# Patient Record
Sex: Female | Born: 1935
Health system: Southern US, Community
[De-identification: ages and names within clinical notes are randomized; demographics above are authoritative.]

## PROBLEM LIST (undated history)

## (undated) ENCOUNTER — Emergency Department: Admission: EM | Payer: Self-pay | Source: Home / Self Care

## (undated) DIAGNOSIS — C801 Malignant (primary) neoplasm, unspecified: Secondary | ICD-10-CM

## (undated) DIAGNOSIS — R51 Headache: Secondary | ICD-10-CM

## (undated) DIAGNOSIS — I272 Pulmonary hypertension, unspecified: Secondary | ICD-10-CM

## (undated) DIAGNOSIS — Z8041 Family history of malignant neoplasm of ovary: Secondary | ICD-10-CM

## (undated) DIAGNOSIS — H269 Unspecified cataract: Secondary | ICD-10-CM

## (undated) DIAGNOSIS — M1712 Unilateral primary osteoarthritis, left knee: Secondary | ICD-10-CM

## (undated) DIAGNOSIS — I10 Essential (primary) hypertension: Secondary | ICD-10-CM

## (undated) DIAGNOSIS — Z923 Personal history of irradiation: Secondary | ICD-10-CM

## (undated) DIAGNOSIS — E119 Type 2 diabetes mellitus without complications: Secondary | ICD-10-CM

## (undated) DIAGNOSIS — M1990 Unspecified osteoarthritis, unspecified site: Secondary | ICD-10-CM

## (undated) DIAGNOSIS — R519 Headache, unspecified: Secondary | ICD-10-CM

## (undated) DIAGNOSIS — E785 Hyperlipidemia, unspecified: Secondary | ICD-10-CM

## (undated) DIAGNOSIS — G8929 Other chronic pain: Secondary | ICD-10-CM

## (undated) DIAGNOSIS — E042 Nontoxic multinodular goiter: Secondary | ICD-10-CM

## (undated) DIAGNOSIS — Z87898 Personal history of other specified conditions: Secondary | ICD-10-CM

## (undated) DIAGNOSIS — K5792 Diverticulitis of intestine, part unspecified, without perforation or abscess without bleeding: Secondary | ICD-10-CM

## (undated) DIAGNOSIS — N189 Chronic kidney disease, unspecified: Secondary | ICD-10-CM

## (undated) HISTORY — DX: Personal history of other specified conditions: Z87.898

## (undated) HISTORY — PX: COLON SURGERY: SHX602

## (undated) HISTORY — PX: EYE SURGERY: SHX253

## (undated) HISTORY — PX: APPENDECTOMY: SHX54

## (undated) HISTORY — DX: Unspecified osteoarthritis, unspecified site: M19.90

## (undated) HISTORY — DX: Nontoxic multinodular goiter: E04.2

## (undated) HISTORY — DX: Headache, unspecified: R51.9

## (undated) HISTORY — PX: ABDOMINAL HYSTERECTOMY: SHX81

## (undated) HISTORY — DX: Other chronic pain: G89.29

## (undated) HISTORY — DX: Family history of malignant neoplasm of ovary: Z80.41

## (undated) HISTORY — DX: Unspecified cataract: H26.9

## (undated) HISTORY — PX: CHOLECYSTECTOMY: SHX55

## (undated) HISTORY — DX: Diverticulitis of intestine, part unspecified, without perforation or abscess without bleeding: K57.92

## (undated) HISTORY — DX: Headache: R51

## (undated) HISTORY — DX: Hyperlipidemia, unspecified: E78.5

## (undated) HISTORY — DX: Pulmonary hypertension, unspecified: I27.20

## (undated) HISTORY — DX: Chronic kidney disease, unspecified: N18.9

## (undated) HISTORY — DX: Unilateral primary osteoarthritis, left knee: M17.12

---

## 2006-02-01 HISTORY — PX: COLOSTOMY REVERSAL: SHX5782

## 2006-02-01 HISTORY — PX: COLOSTOMY: SHX63

## 2016-02-02 HISTORY — PX: REPLACEMENT TOTAL KNEE: SUR1224

## 2017-08-06 ENCOUNTER — Emergency Department
Admission: EM | Admit: 2017-08-06 | Discharge: 2017-08-06 | Disposition: A | Discharge status: Home / Self Care | Payer: Medicare Other | Attending: Emergency Medicine | Admitting: Emergency Medicine

## 2017-08-06 ENCOUNTER — Other Ambulatory Visit: Payer: Self-pay

## 2017-08-06 ENCOUNTER — Encounter: Payer: Self-pay | Admitting: Emergency Medicine

## 2017-08-06 ENCOUNTER — Emergency Department: Payer: Medicare Other

## 2017-08-06 DIAGNOSIS — R51 Headache: Secondary | ICD-10-CM | POA: Diagnosis present

## 2017-08-06 DIAGNOSIS — R519 Headache, unspecified: Secondary | ICD-10-CM

## 2017-08-06 DIAGNOSIS — I10 Essential (primary) hypertension: Secondary | ICD-10-CM | POA: Diagnosis not present

## 2017-08-06 HISTORY — DX: Essential (primary) hypertension: I10

## 2017-08-06 LAB — CBC WITH DIFFERENTIAL/PLATELET
Basophils Absolute: 0.1 10*3/uL (ref 0–0.1)
Basophils Relative: 1 %
EOS PCT: 4 %
Eosinophils Absolute: 0.3 10*3/uL (ref 0–0.7)
HEMATOCRIT: 34.3 % — AB (ref 35.0–47.0)
HEMOGLOBIN: 11.2 g/dL — AB (ref 12.0–16.0)
LYMPHS ABS: 3.2 10*3/uL (ref 1.0–3.6)
LYMPHS PCT: 49 %
MCH: 27.5 pg (ref 26.0–34.0)
MCHC: 32.8 g/dL (ref 32.0–36.0)
MCV: 83.9 fL (ref 80.0–100.0)
Monocytes Absolute: 0.7 10*3/uL (ref 0.2–0.9)
Monocytes Relative: 10 %
NEUTROS PCT: 36 %
Neutro Abs: 2.4 10*3/uL (ref 1.4–6.5)
Platelets: 279 10*3/uL (ref 150–440)
RBC: 4.09 MIL/uL (ref 3.80–5.20)
RDW: 16.3 % — ABNORMAL HIGH (ref 11.5–14.5)
WBC: 6.5 10*3/uL (ref 3.6–11.0)

## 2017-08-06 LAB — COMPREHENSIVE METABOLIC PANEL
ALT: 11 U/L (ref 0–44)
AST: 25 U/L (ref 15–41)
Albumin: 3.6 g/dL (ref 3.5–5.0)
Alkaline Phosphatase: 57 U/L (ref 38–126)
Anion gap: 8 (ref 5–15)
BUN: 24 mg/dL — ABNORMAL HIGH (ref 8–23)
CO2: 28 mmol/L (ref 22–32)
CREATININE: 1.16 mg/dL — AB (ref 0.44–1.00)
Calcium: 9.1 mg/dL (ref 8.9–10.3)
Chloride: 106 mmol/L (ref 98–111)
GFR, EST AFRICAN AMERICAN: 48 mL/min — AB (ref >60)
GFR, EST NON AFRICAN AMERICAN: 42 mL/min — AB (ref >60)
Glucose, Bld: 125 mg/dL — ABNORMAL HIGH (ref 70–99)
Potassium: 3.9 mmol/L (ref 3.5–5.1)
Sodium: 142 mmol/L (ref 135–145)
Total Bilirubin: 0.7 mg/dL (ref 0.3–1.2)
Total Protein: 7.7 g/dL (ref 6.5–8.1)

## 2017-08-06 MED ORDER — TRAMADOL HCL 50 MG PO TABS
50.0000 mg | ORAL_TABLET | Freq: Once | ORAL | Status: AC
  Administered 2017-08-06: 50 mg via ORAL
  Filled 2017-08-06: qty 1

## 2017-08-06 MED ORDER — TRAMADOL HCL 50 MG PO TABS: 50.0000 mg | ORAL_TABLET | Freq: Four times a day (QID) | ORAL | 0 refills | Status: DC | PRN

## 2017-08-06 MED ORDER — ONDANSETRON 4 MG PO TBDP
4.0000 mg | ORAL_TABLET | Freq: Once | ORAL | Status: AC
  Administered 2017-08-06: 4 mg via ORAL
  Filled 2017-08-06: qty 1

## 2017-08-06 NOTE — ED Provider Notes (Signed)
[facility] Emergency Department Provider Note       Time seen: ----------------------------------------- 4:20 PM on 08/06/2017 -----------------------------------------   I have reviewed the triage vital signs and the nursing notes.  HISTORY   Chief Complaint Headache    HPI Ellen [name] is a 84 y.o. female with a history of hypertension who presents to the ED for headache for the past 6 months.  She has a sore area on her scalp that is been worse over the last 3 to 5 days.  She had a CT scan of her head done ordered by her regular doctor for same she has not heard the results.  She fell and hit her head approximately a week ago.  She has no neurologic complaints, only persistent left-sided headache.  Denies fevers, chills or other complaints.  Past Medical History:  Diagnosis Date  . Hypertension     There are no active problems to display for this patient.   Past Surgical History:  Procedure Laterality Date  . ABDOMINAL HYSTERECTOMY    . CHOLECYSTECTOMY    . COLON SURGERY      Allergies Codeine and Morphine and related  Social History Social History   Tobacco Use  . Smoking status: Never Smoker  Substance Use Topics  . Alcohol use: Not on file  . Drug use: Not on file   Review of Systems Constitutional: Negative for fever. Eyes: Negative for vision changes Cardiovascular: Negative for chest pain. Respiratory: Negative for shortness of breath. Gastrointestinal: Negative for abdominal pain, vomiting and diarrhea. Musculoskeletal: Negative for back pain. Skin: Negative for rash. Neurological: Positive for headache  All systems negative/normal/unremarkable except as stated in the HPI  ____________________________________________   PHYSICAL EXAM:  VITAL SIGNS: ED Triage Vitals [08/06/17 1511]  Enc Vitals Group     BP (!) 158/90     Pulse Rate 69     Resp 20     Temp 98.4 F (36.9 C)     Temp Source Oral     SpO2 99 %     Weight 176 lb (79.8 kg)     Height 5' (1.524 m)     Head Circumference      Peak Flow      Pain Score 10     Pain Loc      Pain Edu?      Excl. in [place]?    Constitutional: Alert and oriented. Well appearing and in no distress. Eyes: Conjunctivae are normal. Normal extraocular movements. ENT   Head: Normocephalic and atraumatic.Significant tenderness to the left occiput, no tenderness over the temporal artery.  Normal-appearing scalp   Nose: No congestion/rhinnorhea.   Mouth/Throat: Mucous membranes are moist.   Neck: No stridor. Cardiovascular: Normal rate, regular rhythm. No murmurs, rubs, or gallops. Respiratory: Normal respiratory effort without tachypnea nor retractions. Breath sounds are clear and equal bilaterally. No wheezes/rales/rhonchi. Gastrointestinal: Soft and nontender. Normal bowel sounds Musculoskeletal: Nontender with normal range of motion in extremities. No lower extremity tenderness nor edema.  Neurologic:  Normal speech and language. No gross focal neurologic deficits are appreciated.  Skin:  Skin is warm, dry and intact. No rash noted. Psychiatric: Mood and affect are normal. Speech and behavior are normal.  ____________________________________________  ED COURSE:  As part of my medical decision making, I reviewed the following data within the [place] History obtained from family if available, nursing notes, old chart and ekg, as well as notes from prior ED visits. Patient presented for  headache, we will assess with labs and imaging as indicated at this time.   Procedures ____________________________________________   LABS (pertinent positives/negatives)  Labs Reviewed  CBC WITH DIFFERENTIAL/PLATELET - Abnormal; Notable for the following components:      Result Value   Hemoglobin 11.2 (*)    HCT 34.3 (*)    RDW 16.3 (*)    All other components within normal limits  COMPREHENSIVE METABOLIC PANEL - Abnormal; Notable for the  following components:   Glucose, Bld 125 (*)    BUN 24 (*)    Creatinine, Ser 1.16 (*)    GFR calc non Af Amer 42 (*)    GFR calc Af Amer 48 (*)    All other components within normal limits    RADIOLOGY Images were viewed by me  CT head is unremarkable  ____________________________________________  DIFFERENTIAL DIAGNOSIS   Chronic pain, tension headache, shingles, neuropathic pain  FINAL ASSESSMENT AND PLAN  Headache   Plan: The patient had presented for chronic headache. Patient's labs are unremarkable. Patient's imaging was unremarkable as well.  Unclear etiology for her headache.  She will be discharged with tramadol and is stable for outpatient follow-up.   Laurence Aly, MD   Note: This note was generated in part or whole with voice recognition software. Voice recognition is usually quite accurate but there are transcription errors that can and very often do occur. I apologize for any typographical errors that were not detected and corrected.     Earleen Newport, MD 08/06/17 [phone]

## 2017-08-06 NOTE — ED Triage Notes (Addendum)
States headache x 6 months. States had CT scan of head 5 days ago but does not know result. States has had approx 6 falls in last 3 months. States fell and hit head approx 1 week ago.  States headache has been increasing for past 3 days and is now the worst headache she has ever had in her life. Smile symmetrical, grips equal, leg strength equal. Denies use of blood thinners other than aspirin.

## 2017-08-23 ENCOUNTER — Other Ambulatory Visit: Payer: Self-pay

## 2017-08-23 ENCOUNTER — Encounter: Payer: Self-pay | Admitting: Emergency Medicine

## 2017-08-23 ENCOUNTER — Emergency Department
Admission: EM | Admit: 2017-08-23 | Discharge: 2017-08-23 | Disposition: A | Discharge status: Home / Self Care | Payer: Medicare Other | Attending: Emergency Medicine | Admitting: Emergency Medicine

## 2017-08-23 DIAGNOSIS — R519 Headache, unspecified: Secondary | ICD-10-CM

## 2017-08-23 DIAGNOSIS — I10 Essential (primary) hypertension: Secondary | ICD-10-CM

## 2017-08-23 DIAGNOSIS — R51 Headache: Secondary | ICD-10-CM

## 2017-08-23 MED ORDER — LISINOPRIL 10 MG PO TABS
20.0000 mg | ORAL_TABLET | Freq: Once | ORAL | Status: AC
  Administered 2017-08-23: 20 mg via ORAL
  Filled 2017-08-23: qty 2

## 2017-08-23 MED ORDER — HYDROCHLOROTHIAZIDE 12.5 MG PO CAPS
12.5000 mg | ORAL_CAPSULE | Freq: Every day | ORAL | Status: DC
  Administered 2017-08-23: 12.5 mg via ORAL
  Filled 2017-08-23 (×2): qty 1

## 2017-08-23 MED ORDER — CLONIDINE HCL 0.1 MG PO TABS: 0.1000 mg | ORAL_TABLET | Freq: Once | ORAL | Status: DC

## 2017-08-23 NOTE — ED Notes (Signed)
Pt c/o HA for the past several months, states today her BP was elevated 240/100 and was referred to the ED. Pt states she took tylenol this afternoon and the HA is easing off. Pt is a/ox4

## 2017-08-23 NOTE — ED Provider Notes (Signed)
[facility] Emergency Department Provider Note  ____________________________________________  Time seen: Approximately 5:16 PM  I have reviewed the triage vital signs and the nursing notes.   HISTORY  Chief Complaint Hypertension and Headache    HPI Ellen [name] is a 84 y.o. female with a history of hypertension who was sent to the ED for evaluation of her blood pressure from neurology clinic.  The patient has a history of chronic daily headache since 2008.  She reports that it is been similar to usual pattern recently without any acute changes.  It is not worse than usual.  It is not sudden or thunderclap.  No vision changes tingling or weakness.  No change in balance or coordination or increased falling.  No recent head trauma.  Review of electronic medical record shows she was seen for similar headache August 06, 2017, had a CT scan noncontrast which was unremarkable at that time.  She was referred to neurology for her chronic headaches at that point, but due to her elevated blood pressure today, she was unable to be seen in the clinic and they rescheduled her for 12:30 PM tomorrow.  The patient reports that she did not take her blood pressure medicine this morning, which is her usual practice when coming to the doctor's office in case they need fasting labs.      Past Medical History:  Diagnosis Date  . Hypertension      There are no active problems to display for this patient.    Past Surgical History:  Procedure Laterality Date  . ABDOMINAL HYSTERECTOMY    . CHOLECYSTECTOMY    . COLON SURGERY       Prior to Admission medications   Medication Sig Start Date End Date Taking? Authorizing Provider  traMADol (ULTRAM) 50 MG tablet Take 1 tablet (50 mg total) by mouth every 6 (six) hours as needed. 08/06/17 08/06/18  Earleen Newport, MD     Allergies Codeine and Morphine and related   No family history on file.  Social History Social  History   Tobacco Use  . Smoking status: Never Smoker  . Smokeless tobacco: Never Used  Substance Use Topics  . Alcohol use: Not on file  . Drug use: Not on file    Review of Systems  Constitutional:   No fever or chills.  ENT:   No sore throat. No rhinorrhea. Cardiovascular:   No chest pain or syncope. Respiratory:   No dyspnea or cough. Gastrointestinal:   Negative for abdominal pain, vomiting and diarrhea.  Musculoskeletal:   Negative for focal pain or swelling All other systems reviewed and are negative except as documented above in ROS and HPI.  ____________________________________________   PHYSICAL EXAM:  VITAL SIGNS: ED Triage Vitals  Enc Vitals Group     BP 08/23/17 1432 (!) 214/71     Pulse Rate 08/23/17 1432 64     Resp 08/23/17 1432 18     Temp 08/23/17 1432 98.1 F (36.7 C)     Temp Source 08/23/17 1432 Oral     SpO2 08/23/17 1432 98 %     Weight 08/23/17 1441 176 lb (79.8 kg)     Height 08/23/17 1441 5' (1.524 m)     Head Circumference --      Peak Flow --      Pain Score 08/23/17 1440 10     Pain Loc --      Pain Edu? --      Excl. in  [place]? --     Vital signs reviewed, nursing assessments reviewed.   Constitutional:   Alert and oriented. Non-toxic appearance. Eyes:   Conjunctivae are normal. EOMI. PERRL.  Funduscopy reveals sharp optic disks and no retinal hemorrhages. ENT      Head:   Normocephalic and atraumatic.  No mass.      Nose:   No congestion/rhinnorhea.       Mouth/Throat:   MMM, no pharyngeal erythema. No peritonsillar mass.       Neck:   No meningismus. Full ROM. Hematological/Lymphatic/Immunilogical:   No cervical or occipital lymphadenopathy. Cardiovascular:   RRR. Symmetric bilateral radial and DP pulses.  No murmurs. Cap refill less than 2 seconds. Respiratory:   Normal respiratory effort without tachypnea/retractions. Breath sounds are clear and equal bilaterally. No wheezes/rales/rhonchi. Gastrointestinal:   Soft and nontender.  Non distended. There is no CVA tenderness.  No rebound, rigidity, or guarding. Musculoskeletal:   Normal range of motion in all extremities. No joint effusions.  No lower extremity tenderness.  No edema. Neurologic:   Normal speech and language.  Motor grossly intact. No pronator drift Normal finger-to-nose Normal gait No acute focal neurologic deficits are appreciated.  Skin:    Skin is warm, dry and intact. No rash noted.  No petechiae, purpura, or bullae.  ____________________________________________    LABS (pertinent positives/negatives) (all labs ordered are listed, but only abnormal results are displayed) Labs Reviewed - No data to display ____________________________________________   EKG    ____________________________________________    RADIOLOGY  No results found.  ____________________________________________   PROCEDURES Procedures  ____________________________________________    CLINICAL IMPRESSION / ASSESSMENT AND PLAN / ED COURSE  Pertinent labs & imaging results that were available during my care of the patient were reviewed by me and considered in my medical decision making (see chart for details).    Patient presents with chronic daily headache.  Now improving since taking Tylenol.  She reports that she takes at thousand mill grams of Tylenol 2 times every day to control her headaches but it always comes back.  No acute neurologic symptoms.  Neurologic exam is unremarkable and reassuring.  Blood pressure is very high which I attribute to missing her dose of lisinopril and HCTZ this morning.  Considering the patient's symptoms, medical history, and physical examination today, I have low suspicion for ischemic stroke, intracranial hemorrhage, meningitis, encephalitis, carotid or vertebral dissection, venous sinus thrombosis, MS, intracranial hypertension, glaucoma, CRAO, CRVO, or temporal arteritis. I think that MRI, MRA, or CT angiogram are not  warranted at this time.  I will give the patient her blood pressure medicine now, strongly encouraged her to take her blood pressure medicine in the morning, and return to neurology for her appointment tomorrow for further evaluation of her chronic daily headaches.  Return precautions for any worsening symptoms.      ____________________________________________   FINAL CLINICAL IMPRESSION(S) / ED DIAGNOSES    Final diagnoses:  Uncontrolled hypertension  Chronic daily headache     ED Discharge Orders    None      Portions of this note were generated with dragon dictation software. Dictation errors may occur despite best attempts at proofreading.    Carrie Mew, MD 08/23/17 [phone]

## 2017-08-23 NOTE — ED Triage Notes (Signed)
Seen by neurologist today and blood pressure was elevated.  Sent to ED for evaluation.  BP was 240/110 in office today.  Patient c/o headache intermittently x "months".  Patient is AAOx3.  Skin warm and dry. NAD

## 2017-08-23 NOTE — Discharge Instructions (Addendum)
Take your medications tomorrow, including your blood pressure medicine, and follow up with neurology as scheduled. Return to the ER if you have suddenly worsening headache, tingling or weakness in part of your body, vision changes, or other new concerns.

## 2017-09-13 ENCOUNTER — Ambulatory Visit: Payer: Self-pay | Admitting: Family Medicine

## 2017-09-18 ENCOUNTER — Observation Stay
Admission: EM | Admit: 2017-09-18 | Discharge: 2017-09-19 | Disposition: A | Discharge status: Home / Self Care | Payer: Medicare Other | Attending: Family Medicine | Admitting: Family Medicine

## 2017-09-18 ENCOUNTER — Other Ambulatory Visit: Payer: Self-pay

## 2017-09-18 ENCOUNTER — Encounter: Payer: Self-pay | Admitting: Radiology

## 2017-09-18 ENCOUNTER — Emergency Department: Payer: Medicare Other

## 2017-09-18 DIAGNOSIS — R1084 Generalized abdominal pain: Secondary | ICD-10-CM

## 2017-09-18 DIAGNOSIS — E1165 Type 2 diabetes mellitus with hyperglycemia: Secondary | ICD-10-CM | POA: Diagnosis not present

## 2017-09-18 DIAGNOSIS — K529 Noninfective gastroenteritis and colitis, unspecified: Secondary | ICD-10-CM | POA: Diagnosis present

## 2017-09-18 DIAGNOSIS — Z8249 Family history of ischemic heart disease and other diseases of the circulatory system: Secondary | ICD-10-CM | POA: Diagnosis not present

## 2017-09-18 DIAGNOSIS — E785 Hyperlipidemia, unspecified: Secondary | ICD-10-CM | POA: Insufficient documentation

## 2017-09-18 DIAGNOSIS — Z885 Allergy status to narcotic agent status: Secondary | ICD-10-CM | POA: Diagnosis not present

## 2017-09-18 DIAGNOSIS — N183 Chronic kidney disease, stage 3 (moderate): Secondary | ICD-10-CM | POA: Diagnosis not present

## 2017-09-18 DIAGNOSIS — I129 Hypertensive chronic kidney disease with stage 1 through stage 4 chronic kidney disease, or unspecified chronic kidney disease: Secondary | ICD-10-CM | POA: Diagnosis not present

## 2017-09-18 DIAGNOSIS — E872 Acidosis, unspecified: Secondary | ICD-10-CM

## 2017-09-18 DIAGNOSIS — E1122 Type 2 diabetes mellitus with diabetic chronic kidney disease: Secondary | ICD-10-CM | POA: Insufficient documentation

## 2017-09-18 DIAGNOSIS — I272 Pulmonary hypertension, unspecified: Secondary | ICD-10-CM | POA: Diagnosis not present

## 2017-09-18 DIAGNOSIS — Z79899 Other long term (current) drug therapy: Secondary | ICD-10-CM | POA: Diagnosis not present

## 2017-09-18 DIAGNOSIS — E860 Dehydration: Secondary | ICD-10-CM | POA: Insufficient documentation

## 2017-09-18 DIAGNOSIS — R112 Nausea with vomiting, unspecified: Secondary | ICD-10-CM

## 2017-09-18 LAB — URINALYSIS, COMPLETE (UACMP) WITH MICROSCOPIC
BACTERIA UA: NONE SEEN
BILIRUBIN URINE: NEGATIVE
Glucose, UA: NEGATIVE mg/dL
HGB URINE DIPSTICK: NEGATIVE
KETONES UR: NEGATIVE mg/dL
Leukocytes, UA: NEGATIVE
Nitrite: NEGATIVE
Protein, ur: 100 mg/dL — AB
Specific Gravity, Urine: 1.021 (ref 1.005–1.030)
pH: 5 (ref 5.0–8.0)

## 2017-09-18 LAB — COMPREHENSIVE METABOLIC PANEL
ALT: 9 U/L (ref 0–44)
AST: 26 U/L (ref 15–41)
Albumin: 3.1 g/dL — ABNORMAL LOW (ref 3.5–5.0)
Alkaline Phosphatase: 42 U/L (ref 38–126)
Anion gap: 11 (ref 5–15)
BUN: 28 mg/dL — ABNORMAL HIGH (ref 8–23)
CHLORIDE: 107 mmol/L (ref 98–111)
CO2: 23 mmol/L (ref 22–32)
Calcium: 9 mg/dL (ref 8.9–10.3)
Creatinine, Ser: 1.16 mg/dL — ABNORMAL HIGH (ref 0.44–1.00)
GFR calc non Af Amer: 42 mL/min — ABNORMAL LOW (ref >60)
GFR, EST AFRICAN AMERICAN: 48 mL/min — AB (ref >60)
Glucose, Bld: 210 mg/dL — ABNORMAL HIGH (ref 70–99)
POTASSIUM: 3.6 mmol/L (ref 3.5–5.1)
SODIUM: 141 mmol/L (ref 135–145)
Total Bilirubin: 1 mg/dL (ref 0.3–1.2)
Total Protein: 6.5 g/dL (ref 6.5–8.1)

## 2017-09-18 LAB — CBC
HEMATOCRIT: 39.2 % (ref 35.0–47.0)
HEMOGLOBIN: 12.6 g/dL (ref 12.0–16.0)
MCH: 27.3 pg (ref 26.0–34.0)
MCHC: 32.1 g/dL (ref 32.0–36.0)
MCV: 85.1 fL (ref 80.0–100.0)
PLATELETS: 290 10*3/uL (ref 150–440)
RBC: 4.61 MIL/uL (ref 3.80–5.20)
RDW: 15.6 % — ABNORMAL HIGH (ref 11.5–14.5)
WBC: 17.7 10*3/uL — ABNORMAL HIGH (ref 3.6–11.0)

## 2017-09-18 LAB — LACTIC ACID, PLASMA
LACTIC ACID, VENOUS: 3.6 mmol/L — AB (ref 0.5–1.9)
Lactic Acid, Venous: 3.7 mmol/L (ref 0.5–1.9)

## 2017-09-18 LAB — GLUCOSE, CAPILLARY
GLUCOSE-CAPILLARY: 128 mg/dL — AB (ref 70–99)
GLUCOSE-CAPILLARY: 73 mg/dL (ref 70–99)
Glucose-Capillary: 171 mg/dL — ABNORMAL HIGH (ref 70–99)
Glucose-Capillary: 125 mg/dL — ABNORMAL HIGH (ref 70–99)

## 2017-09-18 LAB — LIPASE, BLOOD: LIPASE: 30 U/L (ref 11–51)

## 2017-09-18 LAB — PROCALCITONIN: Procalcitonin: <0.1 ng/mL

## 2017-09-18 LAB — TROPONIN I

## 2017-09-18 MED ORDER — LISINOPRIL 20 MG PO TABS
20.0000 mg | ORAL_TABLET | Freq: Every day | ORAL | Status: DC
  Administered 2017-09-18 – 2017-09-19 (×2): 20 mg via ORAL
  Filled 2017-09-18 (×2): qty 1

## 2017-09-18 MED ORDER — ACETAMINOPHEN 325 MG PO TABS: 650.0000 mg | ORAL_TABLET | Freq: Four times a day (QID) | ORAL | Status: DC | PRN

## 2017-09-18 MED ORDER — ONDANSETRON HCL 4 MG/2ML IJ SOLN
INTRAMUSCULAR | Status: AC
  Administered 2017-09-18: 4 mg
  Filled 2017-09-18: qty 2

## 2017-09-18 MED ORDER — HYDROMORPHONE HCL 1 MG/ML IJ SOLN
0.5000 mg | INTRAMUSCULAR | Status: DC | PRN
  Administered 2017-09-18 (×2): 0.5 mg via INTRAVENOUS
  Filled 2017-09-18 (×2): qty 1

## 2017-09-18 MED ORDER — AMLODIPINE BESYLATE 5 MG PO TABS
5.0000 mg | ORAL_TABLET | Freq: Every day | ORAL | Status: DC
  Administered 2017-09-18 – 2017-09-19 (×2): 5 mg via ORAL
  Filled 2017-09-18 (×2): qty 1

## 2017-09-18 MED ORDER — ADULT MULTIVITAMIN W/MINERALS CH
1.0000 | ORAL_TABLET | Freq: Every day | ORAL | Status: DC
  Administered 2017-09-19: 11:00:00 1 via ORAL
  Filled 2017-09-18 (×2): qty 1

## 2017-09-18 MED ORDER — SENNOSIDES-DOCUSATE SODIUM 8.6-50 MG PO TABS: 1.0000 | ORAL_TABLET | Freq: Every evening | ORAL | Status: DC | PRN

## 2017-09-18 MED ORDER — BISACODYL 5 MG PO TBEC: 5.0000 mg | DELAYED_RELEASE_TABLET | Freq: Every day | ORAL | Status: DC | PRN

## 2017-09-18 MED ORDER — ACETAMINOPHEN 650 MG RE SUPP: 650.0000 mg | Freq: Four times a day (QID) | RECTAL | Status: DC | PRN

## 2017-09-18 MED ORDER — CIPROFLOXACIN IN D5W 400 MG/200ML IV SOLN
400.0000 mg | Freq: Two times a day (BID) | INTRAVENOUS | Status: DC
  Administered 2017-09-18 – 2017-09-19 (×2): 400 mg via INTRAVENOUS
  Filled 2017-09-18 (×3): qty 200

## 2017-09-18 MED ORDER — IOPAMIDOL (ISOVUE-300) INJECTION 61%
75.0000 mL | Freq: Once | INTRAVENOUS | Status: AC | PRN
  Administered 2017-09-18: 75 mL via INTRAVENOUS

## 2017-09-18 MED ORDER — CIPROFLOXACIN IN D5W 400 MG/200ML IV SOLN
400.0000 mg | Freq: Once | INTRAVENOUS | Status: AC
  Administered 2017-09-18: 400 mg via INTRAVENOUS
  Filled 2017-09-18: qty 200

## 2017-09-18 MED ORDER — METRONIDAZOLE IN NACL 5-0.79 MG/ML-% IV SOLN
500.0000 mg | Freq: Three times a day (TID) | INTRAVENOUS | Status: DC
  Administered 2017-09-18 – 2017-09-19 (×3): 500 mg via INTRAVENOUS
  Filled 2017-09-18 (×5): qty 100

## 2017-09-18 MED ORDER — METRONIDAZOLE IN NACL 5-0.79 MG/ML-% IV SOLN
500.0000 mg | Freq: Once | INTRAVENOUS | Status: AC
  Administered 2017-09-18: 500 mg via INTRAVENOUS
  Filled 2017-09-18: qty 100

## 2017-09-18 MED ORDER — HYDROCHLOROTHIAZIDE 12.5 MG PO CAPS
12.5000 mg | ORAL_CAPSULE | Freq: Every day | ORAL | Status: DC
  Administered 2017-09-18 – 2017-09-19 (×2): 12.5 mg via ORAL
  Filled 2017-09-18 (×2): qty 1

## 2017-09-18 MED ORDER — LISINOPRIL-HYDROCHLOROTHIAZIDE 20-12.5 MG PO TABS: 1.0000 | ORAL_TABLET | Freq: Every day | ORAL | Status: DC

## 2017-09-18 MED ORDER — SODIUM CHLORIDE 0.9 % IV BOLUS
1000.0000 mL | Freq: Once | INTRAVENOUS | Status: AC
  Administered 2017-09-18: 1000 mL via INTRAVENOUS

## 2017-09-18 MED ORDER — ENOXAPARIN SODIUM 40 MG/0.4ML ~~LOC~~ SOLN
40.0000 mg | SUBCUTANEOUS | Status: DC
  Administered 2017-09-18: 22:00:00 40 mg via SUBCUTANEOUS
  Filled 2017-09-18: qty 0.4

## 2017-09-18 MED ORDER — LACTATED RINGERS IV SOLN
INTRAVENOUS | Status: DC
  Administered 2017-09-18 – 2017-09-19 (×2): via INTRAVENOUS

## 2017-09-18 MED ORDER — INSULIN ASPART 100 UNIT/ML ~~LOC~~ SOLN: 0.0000 [IU] | Freq: Every day | SUBCUTANEOUS | Status: DC

## 2017-09-18 MED ORDER — PRAVASTATIN SODIUM 20 MG PO TABS
20.0000 mg | ORAL_TABLET | Freq: Every day | ORAL | Status: DC
  Administered 2017-09-18: 20 mg via ORAL
  Filled 2017-09-18: qty 1

## 2017-09-18 MED ORDER — ONDANSETRON HCL 4 MG/2ML IJ SOLN
4.0000 mg | Freq: Four times a day (QID) | INTRAMUSCULAR | Status: DC | PRN
  Filled 2017-09-18: qty 2

## 2017-09-18 MED ORDER — INSULIN ASPART 100 UNIT/ML ~~LOC~~ SOLN
0.0000 [IU] | Freq: Three times a day (TID) | SUBCUTANEOUS | Status: DC
  Administered 2017-09-18: 13:00:00 2 [IU] via SUBCUTANEOUS
  Administered 2017-09-18: 09:00:00 3 [IU] via SUBCUTANEOUS
  Administered 2017-09-18: 2 [IU] via SUBCUTANEOUS
  Filled 2017-09-18 (×3): qty 1

## 2017-09-18 MED ORDER — ONDANSETRON HCL 4 MG PO TABS: 4.0000 mg | ORAL_TABLET | Freq: Four times a day (QID) | ORAL | Status: DC | PRN

## 2017-09-18 NOTE — ED Notes (Signed)
CRITICAL LAB: Lactic is 3.7, [employer], Dr. Dahlia Client notified, orders received

## 2017-09-18 NOTE — ED Notes (Signed)
Call to floor to update on meds and pt arrival

## 2017-09-18 NOTE — ED Notes (Signed)
admitting Provider at bedside. 

## 2017-09-18 NOTE — Care Management Obs Status (Signed)
[facility] NOTIFICATION   Patient Details  Name: Ellen [name] MRN: [id number] Date of Birth: Jun 26, 1929   Medicare Observation Status Notification Given:  Yes    Massey Ruhland A Raeann Offner, RN 09/18/2017, 9:11 AM

## 2017-09-18 NOTE — Progress Notes (Signed)
84 year old patient, she is a retired [employer] scientist with past medical history significant for hypertension, diabetes, CKD and arthritis brought in from home secondary to abdominal pain and nausea vomiting. -CT abdomen showing significant enteritis -Continue IV fluids, Cipro and Flagyl for now. -No diarrhea.  Slowly improving.  Change diet to clear liquids.  Agree with other plan and follow-up -Patient is independent at baseline and lives at home by herself

## 2017-09-18 NOTE — ED Triage Notes (Signed)
Arrived via ems with luq pain, nausea and vomiting. Ems gave zofran 4mg  iv. fsbs 262, nsr per ems. Pt states pain began at 2100. Pt took tramadol pta.

## 2017-09-18 NOTE — ED Notes (Signed)
Purewick external catheter put on patient by this EDT.

## 2017-09-18 NOTE — ED Notes (Signed)
Pt c/o right hand burning, cipro slowed

## 2017-09-18 NOTE — ED Notes (Signed)
Pt placed on oxygen at 2lpm for pox of 89% on ra with rebound pox to 94%.

## 2017-09-18 NOTE — ED Notes (Signed)
Pt became nauseous preparing for transport

## 2017-09-18 NOTE — ED Notes (Signed)
Family at bedside.  Pt reports being warmer now and pain has subsided

## 2017-09-18 NOTE — ED Notes (Signed)
Patient transported to CT 

## 2017-09-18 NOTE — ED Notes (Signed)
CRITICAL LAB: LACTIC is 3.6, [facility], Dr. Dahlia Client notified, orders received

## 2017-09-18 NOTE — ED Provider Notes (Signed)
[facility] Emergency Department Provider Note   ____________________________________________   First MD Initiated Contact with Patient 09/18/17 0139     (approximate)  I have reviewed the triage vital signs and the nursing notes.   HISTORY  Chief Complaint Abdominal Pain    HPI Ellen [name] is a 84 y.o. female who comes into the hospital today because she could not stop vomiting.  The patient states that this started around 9:00 this evening.  She had some abdominal pain as well.  She reports that it was her upper abdomen more on the left.  She states that she vomited many times once with an egg in it.  She denies any diarrhea but states that her pain was a 10 out of 10 in intensity.  It is improved now.  The patient has never had this before and denies any fevers.  She was given Zofran by EMS.  The patient is here today for evaluation of her vomiting and abdominal pain.   Past Medical History:  Diagnosis Date  . Arthritis   . Cataract   . Chronic headaches   . CKD (chronic kidney disease)   . Diabetes mellitus type 2, uncomplicated ([place])   . Diverticulitis   . History of syncope   . Hyperlipidemia   . Hypertension   . Multinodular goiter   . Primary osteoarthritis of left knee   . Pulmonary hypertension [facility])     Patient Active Problem List   Diagnosis Date Noted  . Enteritis 09/18/2017    Past Surgical History:  Procedure Laterality Date  . ABDOMINAL HYSTERECTOMY    . CHOLECYSTECTOMY    . COLON SURGERY    . COLOSTOMY    . COLOSTOMY REVERSAL    . REPLACEMENT TOTAL KNEE      Prior to Admission medications   Medication Sig Start Date End Date Taking? Authorizing Provider  amLODipine (NORVASC) 5 MG tablet Take 1 tablet by mouth daily. 07/07/16  Yes [provider]  lisinopril-hydrochlorothiazide (PRINZIDE,ZESTORETIC) 20-12.5 MG tablet Take 1 tablet by mouth daily. 07/28/17  Yes [provider]  Multiple  Vitamins-Minerals (CENTRUM SILVER 50+WOMEN) TABS Take 1 tablet by mouth daily.   Yes [provider]  pravastatin (PRAVACHOL) 20 MG tablet Take 1 tablet by mouth daily. 07/28/17  Yes [provider]  traMADol (ULTRAM) 50 MG tablet Take 1 tablet (50 mg total) by mouth every 6 (six) hours as needed. Patient not taking: Reported on 09/18/2017 08/06/17 08/06/18  Earleen Newport, MD    Allergies Codeine and Morphine and related  Family History  Problem Relation Age of Onset  . Lung cancer Mother   . Diabetes Father   . Stroke Father   . Hypertension Maternal Grandmother   . Hypertension Maternal Uncle   . Hypertension Maternal Aunt   . Ovarian cancer Maternal Aunt   . Heart disease Daughter     Social History Social History   Tobacco Use  . Smoking status: Never Smoker  . Smokeless tobacco: Never Used  Substance Use Topics  . Alcohol use: Not on file  . Drug use: Not on file    Review of Systems  Constitutional: No fever/chills Eyes: No visual changes. ENT: No sore throat. Cardiovascular: Denies chest pain. Respiratory: Denies shortness of breath. Gastrointestinal:  abdominal pain. nausea, vomiting.  No diarrhea.  No constipation. Genitourinary: Negative for dysuria. Musculoskeletal: Negative for back pain. Skin: Negative for rash. Neurological: Negative for headaches   ____________________________________________   PHYSICAL  EXAM:  VITAL SIGNS: ED Triage Vitals  Enc Vitals Group     BP 09/18/17 0126 (!) 147/61     Pulse Rate 09/18/17 0126 78     Resp 09/18/17 0125 20     Temp 09/18/17 0138 (!) 96.7 F (35.9 C)     Temp Source 09/18/17 0125 Oral     SpO2 09/18/17 0126 (!) 89 %     Weight 09/18/17 0125 165 lb 5.5 oz (75 kg)     Height 09/18/17 0125 5' (1.524 m)     Head Circumference --      Peak Flow --      Pain Score 09/18/17 0125 10     Pain Loc --      Pain Edu? --      Excl. in [place]? --     Constitutional: Alert and oriented. Well  appearing and in moderate distress. Eyes: Conjunctivae are normal. PERRL. EOMI. Head: Atraumatic. Nose: No congestion/rhinnorhea. Mouth/Throat: Mucous membranes are moist.  Oropharynx non-erythematous. Cardiovascular: Normal rate, regular rhythm. Grossly normal heart sounds.  Good peripheral circulation. Respiratory: Normal respiratory effort.  No retractions. Lungs CTAB. Gastrointestinal: Soft with some left sided abd pain. No distention.  Positive bowel sounds Musculoskeletal: No lower extremity tenderness nor edema.   Neurologic:  Normal speech and language.  Skin:  Skin is warm, dry and intact.  Psychiatric: Mood and affect are normal.   ____________________________________________   LABS (all labs ordered are listed, but only abnormal results are displayed)  Labs Reviewed  COMPREHENSIVE METABOLIC PANEL - Abnormal; Notable for the following components:      Result Value   Glucose, Bld 210 (*)    BUN 28 (*)    Creatinine, Ser 1.16 (*)    Albumin 3.1 (*)    GFR calc non Af Amer 42 (*)    GFR calc Af Amer 48 (*)    All other components within normal limits  CBC - Abnormal; Notable for the following components:   WBC 17.7 (*)    RDW 15.6 (*)    All other components within normal limits  URINALYSIS, COMPLETE (UACMP) WITH MICROSCOPIC - Abnormal; Notable for the following components:   Color, Urine YELLOW (*)    APPearance HAZY (*)    Protein, ur 100 (*)    All other components within normal limits  LACTIC ACID, PLASMA - Abnormal; Notable for the following components:   Lactic Acid, Venous 3.6 (*)    All other components within normal limits  LACTIC ACID, PLASMA - Abnormal; Notable for the following components:   Lactic Acid, Venous 3.7 (*)    All other components within normal limits  CULTURE, BLOOD (ROUTINE X 2)  CULTURE, BLOOD (ROUTINE X 2)  LIPASE, BLOOD  TROPONIN I   ____________________________________________  EKG  ED ECG REPORT I, Loney Hering, the  attending physician, personally viewed and interpreted this ECG.   Date: 09/18/2017  EKG Time: 131  Rate: 73  Rhythm: normal sinus rhythm  Axis: normal  Intervals:none  ST&T Change: none  ____________________________________________  RADIOLOGY  ED MD interpretation:  CT abd and pelvis: Acute inflammatory changes about multiple prominent loops of small bowel clustered within the left upper quadrant suspicious for acute enteritis, moderate volume reactive free fluid within the abdomen. These loops smoothly taper to decompressed bowel distally suggesting than an underlying degree of obstruction may be contributory. Scattered patchy and ground glass opacities within the visualized lung bases, likely atelectasis although infiltrated could be considered  in the correct clinical setting. Aortic atherosclerosis  Official radiology report(s): Ct Abdomen Pelvis W Contrast  Result Date: 09/18/2017 CLINICAL DATA:  Initial evaluation for acute upper quadrant abdominal pain. EXAM: CT ABDOMEN AND PELVIS WITH CONTRAST TECHNIQUE: Multidetector CT imaging of the abdomen and pelvis was performed using the standard protocol following bolus administration of intravenous contrast. CONTRAST:  27mL ISOVUE-300 IOPAMIDOL (ISOVUE-300) INJECTION 61% COMPARISON:  None available. FINDINGS: Lower chest: Scattered patchy and ground-glass opacities within the bilateral lung bases, likely atelectasis, although infiltrates not excluded. Cardiomegaly partially visualized. Hepatobiliary: Liver demonstrates a normal contrast enhanced appearance. Gallbladder absent. Mild intra and extrahepatic biliary dilatation like related post cholecystectomy changes. Pancreas: Pancreas mildly atrophic but within normal limits. Spleen: Spleen appears to be absent with scattered surgical clips present within the left upper quadrant. Probable small splenule noted within this region. Adrenals/Urinary Tract: Adrenal glands are normal. Kidneys equal in  size with symmetric enhancement. No nephrolithiasis, hydronephrosis, or focal enhancing renal mass. No appreciable hydroureter. Bladder largely decompressed without acute abnormality. Stomach/Bowel: Small hiatal hernia. Stomach partially distended without definite abnormality. Multiple prominent loops of mildly dilated small bowel clustered within the left upper quadrant demonstrate circumferential wall thickening with mucosal edema, compatible with acute inflammation. Findings suggestive of an acute enteritis. Associated reactive free fluid within the left and upper abdomen. No pneumatosis or portal venous gas. These loops smoothly taper to decompressed bowel distally at the level of the left mid abdomen (series 2, image 36). Finding suggests that a degree of underlying obstruction or least partial obstruction could be contributory. Small bowel decompressed distally without abnormality. No acute inflammatory changes about the residual colon. Vascular/Lymphatic: Normal intravascular enhancement seen throughout the intra-abdominal aorta. Moderate to advanced aorto bi-iliac atherosclerotic disease. Mesenteric vessels grossly patent proximally. No adenopathy. Reproductive: Uterus is absent.  Ovaries not discretely identified. Other: No free air. Moderate free fluid within the upper abdomen, likely reactive. Musculoskeletal: No acute osseous abnormality. No worrisome lytic or blastic osseous lesions. Scoliosis with multilevel degenerative spondylolysis and facet arthrosis noted within the lumbar spine, greatest at L4-5 and L5-S1. IMPRESSION: 1. Acute inflammatory changes about multiple prominent loops of small bowel clustered within the left upper quadrant, suspicious for acute enteritis. Moderate volume reactive free fluid within the abdomen. These loops smoothly taper to decompressed bowel distally, suggesting that an underlying degree of obstruction (or least partial obstruction) may be contributory. 2. Scattered  patchy and ground-glass opacities within the visualized lung bases, likely atelectasis, although infiltrates could be considered in the correct clinical setting. 3. Aortic atherosclerosis. Electronically Signed   By: Jeannine Boga M.D.   On: 09/18/2017 03:52    ____________________________________________   PROCEDURES  Procedure(s) performed: None  Procedures  Critical Care performed: No  ____________________________________________   INITIAL IMPRESSION / ASSESSMENT AND PLAN / ED COURSE  As part of my medical decision making, I reviewed the following data within the electronic MEDICAL RECORD NUMBER Notes from prior ED visits and [place] Controlled Substance Database   This is an 84 year old female who comes into the hospital today with vomiting and abdominal pain.  The patient is never had this before.  She was diaphoretic when she arrived.  We did check some blood work to include a CBC, CMP and a urinalysis.  The patient's white blood cell count was 17.7.  She also had a lactic acid which was 3.6.  Her troponin was negative.  I did send the patient for CT scan of her abdomen and pelvis and it was found  to show acute inflammatory changes about multiple prominent loops of small bowel with a concern for enteritis versus ileus versus a small bowel obstruction.  I did give the patient some fluids as well as some ciprofloxacin and Flagyl.  Given her elevated lactic acid we will admit the patient to the hospitalist service for fluids and antibiotics as needed.      ____________________________________________   FINAL CLINICAL IMPRESSION(S) / ED DIAGNOSES  Final diagnoses:  Enteritis  Generalized abdominal pain  Non-intractable vomiting with nausea, unspecified vomiting type  Lactic acidosis     ED Discharge Orders    None       Note:  This document was prepared using Dragon voice recognition software and may include unintentional dictation errors.    Loney Hering,  MD 09/18/17 [phone]

## 2017-09-18 NOTE — H&P (Signed)
[place] [facility] [place] NAME: Ellen [name]    MR#:  [id number]  DATE OF BIRTH:  1930-01-21  DATE OF ADMISSION:  09/18/2017  PRIMARY CARE PHYSICIAN: Volney American, PA-C   REQUESTING/REFERRING PHYSICIAN: Loney Hering, MD  CHIEF COMPLAINT:   Chief Complaint  Patient presents with  . Abdominal Pain    HISTORY OF PRESENT ILLNESS:  Ellen [name]  is a 84 y.o. female with a known history of T2NIDDM, HTN, HLD p/w 1d hx AP/N/V. Pt is independent and exceptionally functional. She lives at home by herself, drives, does groceries and pays her own bills. She does not have significant hearing/vision loss, and ambulated w/o assistive device. She says that on the morning of Saturday 09/17/2017, she was cutting the grass. She states it got hot, and she went inside. @~1700PM, pt states she developed epigastric/upper abdominal pain. She states the pain was mild, coming & going. She sat on the toilet and tried to move her bowels, but was not able to, (+) tenesmus. She continued to have waxing/waning pain. She states that @~2100PM, pain increased in severity and became persistent. She then developed N/V, followed by dry heaving. She did not visualize her vomitus. Endorses fatigue/malaise, generalized weakness, dehydration, lightheadedness and orthostasis/positional near-syncope. She denies sick contacts, recent travel or changes in diet. She cooks her own meals. She is not in distress, and does not appear septic/toxic. She is otherwise w/o complaint.  PAST MEDICAL HISTORY:   Past Medical History:  Diagnosis Date  . Arthritis   . Cataract   . Chronic headaches   . CKD (chronic kidney disease)   . Diabetes mellitus type 2, uncomplicated ([place])   . Diverticulitis   . History of syncope   . Hyperlipidemia   . Hypertension   . Multinodular goiter   . Primary osteoarthritis of left knee   . Pulmonary hypertension ([place])     PAST SURGICAL  HISTORY:   Past Surgical History:  Procedure Laterality Date  . ABDOMINAL HYSTERECTOMY    . CHOLECYSTECTOMY    . COLON SURGERY    . COLOSTOMY    . COLOSTOMY REVERSAL    . REPLACEMENT TOTAL KNEE      SOCIAL HISTORY:   Social History   Tobacco Use  . Smoking status: Never Smoker  . Smokeless tobacco: Never Used  Substance Use Topics  . Alcohol use: Not on file    FAMILY HISTORY:   Family History  Problem Relation Age of Onset  . Lung cancer Mother   . Diabetes Father   . Stroke Father   . Hypertension Maternal Grandmother   . Hypertension Maternal Uncle   . Hypertension Maternal Aunt   . Ovarian cancer Maternal Aunt   . Heart disease Daughter     DRUG ALLERGIES:   Allergies  Allergen Reactions  . Codeine Nausea Only  . Morphine And Related Nausea Only    REVIEW OF SYSTEMS:   Review of Systems  Constitutional: Positive for malaise/fatigue. Negative for chills, diaphoresis, fever and weight loss.  HENT: Negative for congestion, ear pain, hearing loss, nosebleeds, sinus pain, sore throat and tinnitus.   Eyes: Negative for blurred vision, double vision and photophobia.  Respiratory: Negative for cough, hemoptysis, sputum production, shortness of breath and wheezing.   Cardiovascular: Negative for chest pain, palpitations, orthopnea, claudication, leg swelling and PND.  Gastrointestinal: Positive for abdominal pain, nausea and vomiting. Negative for blood in stool, constipation, diarrhea, heartburn and melena.  Genitourinary: Negative for dysuria, frequency, hematuria and urgency.  Musculoskeletal: Negative for back pain, joint pain, myalgias and neck pain.  Skin: Negative for itching and rash.  Neurological: Positive for dizziness and weakness. Negative for tingling, tremors, sensory change, speech change, focal weakness, seizures, loss of consciousness and headaches.  Psychiatric/Behavioral: Negative for memory loss. The patient does not have insomnia.     MEDICATIONS AT HOME:   Prior to Admission medications   Medication Sig Start Date End Date Taking? Authorizing Provider  amLODipine (NORVASC) 5 MG tablet Take 1 tablet by mouth daily. 07/07/16  Yes [provider]  lisinopril-hydrochlorothiazide (PRINZIDE,ZESTORETIC) 20-12.5 MG tablet Take 1 tablet by mouth daily. 07/28/17  Yes [provider]  Multiple Vitamins-Minerals (CENTRUM SILVER 50+WOMEN) TABS Take 1 tablet by mouth daily.   Yes [provider]  pravastatin (PRAVACHOL) 20 MG tablet Take 1 tablet by mouth daily. 07/28/17  Yes [provider]  traMADol (ULTRAM) 50 MG tablet Take 1 tablet (50 mg total) by mouth every 6 (six) hours as needed. Patient not taking: Reported on 09/18/2017 08/06/17 08/06/18  Earleen Newport, MD      VITAL SIGNS:  Blood pressure (!) 166/79, pulse 70, temperature 97.6 F (36.4 C), temperature source Oral, resp. rate 18, height 5' (1.524 m), weight 75 kg, SpO2 100 %.  PHYSICAL EXAMINATION:  Physical Exam  Constitutional: She is oriented to person, place, and time. She appears well-developed and well-nourished. She is active and cooperative.  Non-toxic appearance. She does not have a sickly appearance. No distress. She is not intubated.  HENT:  Head: Normocephalic and atraumatic.  Mouth/Throat: Oropharynx is clear and moist. No oropharyngeal exudate.  Eyes: Conjunctivae, EOM and lids are normal. No scleral icterus.  Neck: Neck supple. No JVD present. No thyromegaly present.  Cardiovascular: Normal rate, regular rhythm, S1 normal and S2 normal.  No extrasystoles are present. Exam reveals no gallop, no S3, no S4, no distant heart sounds and no friction rub.  No murmur heard. Pulmonary/Chest: Effort normal and breath sounds normal. No accessory muscle usage or stridor. No apnea, no tachypnea and no bradypnea. She is not intubated. No respiratory distress. She has no decreased breath sounds. She has no wheezes. She has no  rhonchi. She has no rales.  Abdominal: Soft. Normal appearance. She exhibits no distension and no ascites. Bowel sounds are decreased. There is no tenderness. There is no rigidity, no rebound and no guarding.  Lymphadenopathy:    She has no cervical adenopathy.  Neurological: She is alert and oriented to person, place, and time. She is not disoriented.  Skin: Skin is warm and dry. No rash noted. She is not diaphoretic. No erythema.  Psychiatric: She has a normal mood and affect. Her speech is normal and behavior is normal. Judgment and thought content normal. Cognition and memory are normal.   Abdomen non-tender on exam. LABORATORY PANEL:   CBC Recent Labs  Lab 09/18/17 0134  WBC 17.7*  HGB 12.6  HCT 39.2  PLT 290   ------------------------------------------------------------------------------------------------------------------  Chemistries  Recent Labs  Lab 09/18/17 0134  NA 141  K 3.6  CL 107  CO2 23  GLUCOSE 210*  BUN 28*  CREATININE 1.16*  CALCIUM 9.0  AST 26  ALT 9  ALKPHOS 42  BILITOT 1.0   ------------------------------------------------------------------------------------------------------------------  Cardiac Enzymes Recent Labs  Lab 09/18/17 0134  TROPONINI <0.03   ------------------------------------------------------------------------------------------------------------------  RADIOLOGY:  Ct Abdomen Pelvis W Contrast  Result Date: 09/18/2017 CLINICAL DATA:  Initial evaluation for  acute upper quadrant abdominal pain. EXAM: CT ABDOMEN AND PELVIS WITH CONTRAST TECHNIQUE: Multidetector CT imaging of the abdomen and pelvis was performed using the standard protocol following bolus administration of intravenous contrast. CONTRAST:  57mL ISOVUE-300 IOPAMIDOL (ISOVUE-300) INJECTION 61% COMPARISON:  None available. FINDINGS: Lower chest: Scattered patchy and ground-glass opacities within the bilateral lung bases, likely atelectasis, although infiltrates not  excluded. Cardiomegaly partially visualized. Hepatobiliary: Liver demonstrates a normal contrast enhanced appearance. Gallbladder absent. Mild intra and extrahepatic biliary dilatation like related post cholecystectomy changes. Pancreas: Pancreas mildly atrophic but within normal limits. Spleen: Spleen appears to be absent with scattered surgical clips present within the left upper quadrant. Probable small splenule noted within this region. Adrenals/Urinary Tract: Adrenal glands are normal. Kidneys equal in size with symmetric enhancement. No nephrolithiasis, hydronephrosis, or focal enhancing renal mass. No appreciable hydroureter. Bladder largely decompressed without acute abnormality. Stomach/Bowel: Small hiatal hernia. Stomach partially distended without definite abnormality. Multiple prominent loops of mildly dilated small bowel clustered within the left upper quadrant demonstrate circumferential wall thickening with mucosal edema, compatible with acute inflammation. Findings suggestive of an acute enteritis. Associated reactive free fluid within the left and upper abdomen. No pneumatosis or portal venous gas. These loops smoothly taper to decompressed bowel distally at the level of the left mid abdomen (series 2, image 36). Finding suggests that a degree of underlying obstruction or least partial obstruction could be contributory. Small bowel decompressed distally without abnormality. No acute inflammatory changes about the residual colon. Vascular/Lymphatic: Normal intravascular enhancement seen throughout the intra-abdominal aorta. Moderate to advanced aorto bi-iliac atherosclerotic disease. Mesenteric vessels grossly patent proximally. No adenopathy. Reproductive: Uterus is absent.  Ovaries not discretely identified. Other: No free air. Moderate free fluid within the upper abdomen, likely reactive. Musculoskeletal: No acute osseous abnormality. No worrisome lytic or blastic osseous lesions. Scoliosis with  multilevel degenerative spondylolysis and facet arthrosis noted within the lumbar spine, greatest at L4-5 and L5-S1. IMPRESSION: 1. Acute inflammatory changes about multiple prominent loops of small bowel clustered within the left upper quadrant, suspicious for acute enteritis. Moderate volume reactive free fluid within the abdomen. These loops smoothly taper to decompressed bowel distally, suggesting that an underlying degree of obstruction (or least partial obstruction) may be contributory. 2. Scattered patchy and ground-glass opacities within the visualized lung bases, likely atelectasis, although infiltrates could be considered in the correct clinical setting. 3. Aortic atherosclerosis. Electronically Signed   By: Jeannine Boga M.D.   On: 09/18/2017 03:52   IMPRESSION AND PLAN:   A/P: 38F AP/N/V, CT A/P (+) enteritis, SIRS (-). Lactate elevation, dehydration. Hyperglycemia (w/ T2NIDDM), Cr elevation/CKD III, hypoalbuminemia. -AP/N/V, enteritis: Pt p/w 1d Hx AP/N/V. CT A/P (+) enteritis. Started on Cipro/Flagyl in ED, cont'd. PCT pending, may be able to de-escalate if result requisitely low. IVF, antiemetic, pain ctrl. -Lactate elevation, dehydration: Lactate 3.6. Did not receive adequate IVF prior to rpt Lactate, which came back at 3.7. Will need to recheck s/p IVF administration. 2/2 above. -Hyperglycemia/T2NIDDM: SSI. -Cr elevation/CKD III: Cr 1.16. Baseline 1.1-1.2, pt at baseline. Likely underlying CKD III (2/2 DM, HTN, aged kidney). IVF as above. Monitor BMP, avoid nephrotoxins. -Hypoalbuminemia: Prealbumin.  -c/w home meds/formulary subs. -FEN/GI: Clear liquid diet, ADAT (cardiac diabetic diet). -DVT PPx: Lovenox. -Code status: Full code. -Disposition: Observation, < 2 midnights.   All the records are reviewed and case discussed with ED provider. Management plans discussed with the patient, family and they are in agreement.  CODE STATUS: Full code.  TOTAL TIME TAKING CARE  OF  THIS PATIENT: 75 minutes.    Arta Silence M.D on 09/18/2017 at 7:43 AM  Between 7am to 6pm - Pager - [phone]  After 6pm go to [email or web address] - password [email or web address] [facility]  [employer]  Hospitalists  Office  [phone]  CC: Primary care physician; Volney American, PA-C   Note: This dictation was prepared with Dragon dictation along with smaller phrase technology. Any transcriptional errors that result from this process are unintentional.

## 2017-09-18 NOTE — ED Notes (Signed)
Pt in and out cathed for urine with help of kala, rn. Pt tolerated well, urine clear, yellow without odor.

## 2017-09-19 LAB — BLOOD CULTURE ID PANEL (REFLEXED)
ACINETOBACTER BAUMANNII: NOT DETECTED
CANDIDA ALBICANS: NOT DETECTED
CANDIDA GLABRATA: NOT DETECTED
Candida krusei: NOT DETECTED
Candida parapsilosis: NOT DETECTED
Candida tropicalis: NOT DETECTED
ENTEROBACTER CLOACAE COMPLEX: NOT DETECTED
ENTEROBACTERIACEAE SPECIES: NOT DETECTED
Enterococcus species: NOT DETECTED
Escherichia coli: NOT DETECTED
Haemophilus influenzae: NOT DETECTED
Klebsiella oxytoca: NOT DETECTED
Klebsiella pneumoniae: NOT DETECTED
LISTERIA MONOCYTOGENES: NOT DETECTED
NEISSERIA MENINGITIDIS: NOT DETECTED
PSEUDOMONAS AERUGINOSA: NOT DETECTED
Proteus species: NOT DETECTED
STREPTOCOCCUS AGALACTIAE: NOT DETECTED
STREPTOCOCCUS PNEUMONIAE: NOT DETECTED
Serratia marcescens: NOT DETECTED
Staphylococcus aureus (BCID): NOT DETECTED
Staphylococcus species: NOT DETECTED
Streptococcus pyogenes: NOT DETECTED
Streptococcus species: NOT DETECTED

## 2017-09-19 LAB — PREALBUMIN: PREALBUMIN: 14.2 mg/dL — AB (ref 18–38)

## 2017-09-19 LAB — BASIC METABOLIC PANEL
Anion gap: 7 (ref 5–15)
BUN: 20 mg/dL (ref 8–23)
CO2: 28 mmol/L (ref 22–32)
Calcium: 8.7 mg/dL — ABNORMAL LOW (ref 8.9–10.3)
Chloride: 107 mmol/L (ref 98–111)
Creatinine, Ser: 1.22 mg/dL — ABNORMAL HIGH (ref 0.44–1.00)
GFR calc Af Amer: 45 mL/min — ABNORMAL LOW (ref >60)
GFR calc non Af Amer: 39 mL/min — ABNORMAL LOW (ref >60)
Glucose, Bld: 96 mg/dL (ref 70–99)
Potassium: 3.6 mmol/L (ref 3.5–5.1)
SODIUM: 142 mmol/L (ref 135–145)

## 2017-09-19 LAB — CBC
HEMATOCRIT: 32.7 % — AB (ref 35.0–47.0)
Hemoglobin: 10.7 g/dL — ABNORMAL LOW (ref 12.0–16.0)
MCH: 27.5 pg (ref 26.0–34.0)
MCHC: 32.7 g/dL (ref 32.0–36.0)
MCV: 84 fL (ref 80.0–100.0)
Platelets: 236 10*3/uL (ref 150–440)
RBC: 3.89 MIL/uL (ref 3.80–5.20)
RDW: 15.8 % — AB (ref 11.5–14.5)
WBC: 14.5 10*3/uL — AB (ref 3.6–11.0)

## 2017-09-19 LAB — PROCALCITONIN: PROCALCITONIN: 0.99 ng/mL

## 2017-09-19 LAB — GLUCOSE, CAPILLARY: GLUCOSE-CAPILLARY: 89 mg/dL (ref 70–99)

## 2017-09-19 LAB — PHOSPHORUS: Phosphorus: 2.5 mg/dL (ref 2.5–4.6)

## 2017-09-19 LAB — MAGNESIUM: Magnesium: 1.8 mg/dL (ref 1.7–2.4)

## 2017-09-19 MED ORDER — CIPROFLOXACIN HCL 500 MG PO TABS: 500.0000 mg | ORAL_TABLET | Freq: Two times a day (BID) | ORAL | 0 refills | Status: AC

## 2017-09-19 MED ORDER — METRONIDAZOLE 500 MG PO TABS: 500.0000 mg | ORAL_TABLET | Freq: Three times a day (TID) | ORAL | 0 refills | Status: AC

## 2017-09-19 NOTE — Progress Notes (Signed)
Discharge instructions given and went over with patient and patients son at bedside. Prescriptions given and reviewed. Diet education provided. All questions answered. Patient discharged home with son via wheelchair by volunteer services. Madlyn Frankel, RN

## 2017-09-19 NOTE — Discharge Summary (Signed)
[facility] [place] NAME: Gianella Chismar    MR#:  [id number]  DATE OF BIRTH:  [birth year]  DATE OF ADMISSION:  09/18/2017 ADMITTING PHYSICIAN: Arta Silence, MD  DATE OF DISCHARGE: No discharge date for patient encounter.  PRIMARY CARE PHYSICIAN: No primary care provider on file.    ADMISSION DIAGNOSIS:  Lactic acidosis [E87.2] Enteritis [K52.9] Generalized abdominal pain [R10.84] Non-intractable vomiting with nausea, unspecified vomiting type [R11.2]  DISCHARGE DIAGNOSIS:  Active Problems:   Enteritis   SECONDARY DIAGNOSIS:   Past Medical History:  Diagnosis Date  . Arthritis   . Cataract   . Chronic headaches   . CKD (chronic kidney disease)   . Diabetes mellitus type 2, uncomplicated ([place])   . Diverticulitis   . History of syncope   . Hyperlipidemia   . Hypertension   . Multinodular goiter   . Primary osteoarthritis of left knee   . Pulmonary hypertension ([place])     HOSPITAL COURSE:  *Acute enteritis Resolving Treated with empiric Cipro/Flagyl, provided antiemetics for nausea/emesis, abdominal pain has resolved, patient tolerating clear liquid diet on the day of discharge, patient instructed to continue bland diet for the next 1 to 2 days with advancement thereafter, to follow-up with primary care provider in 3 to 5 days for reevaluation   DISCHARGE CONDITIONS:   stable  CONSULTS OBTAINED:  Treatment Team:  Arta Silence, MD  DRUG ALLERGIES:   Allergies  Allergen Reactions  . Codeine Nausea Only  . Morphine And Related Nausea Only    DISCHARGE MEDICATIONS:   Allergies as of 09/19/2017      Reactions   Codeine Nausea Only   Morphine And Related Nausea Only      Medication List    TAKE these medications   amLODipine 5 MG tablet Commonly known as:  NORVASC Take 1 tablet by mouth daily.   CENTRUM SILVER 50+WOMEN Tabs Take 1 tablet by mouth daily.   ciprofloxacin 500 MG  tablet Commonly known as:  CIPRO Take 1 tablet (500 mg total) by mouth 2 (two) times daily for 10 days.   lisinopril-hydrochlorothiazide 20-12.5 MG tablet Commonly known as:  PRINZIDE,ZESTORETIC Take 1 tablet by mouth daily.   metroNIDAZOLE 500 MG tablet Commonly known as:  FLAGYL Take 1 tablet (500 mg total) by mouth 3 (three) times daily for 14 days.   pravastatin 20 MG tablet Commonly known as:  PRAVACHOL Take 1 tablet by mouth daily.   traMADol 50 MG tablet Commonly known as:  ULTRAM Take 1 tablet (50 mg total) by mouth every 6 (six) hours as needed.        DISCHARGE INSTRUCTIONS:   If you experience worsening of your admission symptoms, develop shortness of breath, life threatening emergency, suicidal or homicidal thoughts you must seek medical attention immediately by calling 911 or calling your MD immediately  if symptoms less severe.  You Must read complete instructions/literature along with all the possible adverse reactions/side effects for all the Medicines you take and that have been prescribed to you. Take any new Medicines after you have completely understood and accept all the possible adverse reactions/side effects.   Please note  You were cared for by a hospitalist during your hospital stay. If you have any questions about your discharge medications or the care you received while you were in the hospital after you are discharged, you can call the unit and asked to speak with the hospitalist on call if the hospitalist that  took care of you is not available. Once you are discharged, your primary care physician will handle any further medical issues. Please note that NO REFILLS for any discharge medications will be authorized once you are discharged, as it is imperative that you return to your primary care physician (or establish a relationship with a primary care physician if you do not have one) for your aftercare needs so that they can reassess your need for  medications and monitor your lab values.    Today   CHIEF COMPLAINT:   Chief Complaint  Patient presents with  . Abdominal Pain    HISTORY OF PRESENT ILLNESS:  84 y.o. female with a known history of T2NIDDM, HTN, HLD p/w 1d hx AP/N/V. Pt is independent and exceptionally functional. She lives at home by herself, drives, does groceries and pays her own bills. She does not have significant hearing/vision loss, and ambulated w/o assistive device. She says that on the morning of Saturday 09/17/2017, she was cutting the grass. She states it got hot, and she went inside. @~1700PM, pt states she developed epigastric/upper abdominal pain. She states the pain was mild, coming & going. She sat on the toilet and tried to move her bowels, but was not able to, (+) tenesmus. She continued to have waxing/waning pain. She states that @~2100PM, pain increased in severity and became persistent. She then developed N/V, followed by dry heaving. She did not visualize her vomitus. Endorses fatigue/malaise, generalized weakness, dehydration, lightheadedness and orthostasis/positional near-syncope. She denies sick contacts, recent travel or changes in diet. She cooks her own meals. She is not in distress, and does not appear septic/toxic. She is otherwise w/o complaint.    VITAL SIGNS:  Blood pressure (!) 148/59, pulse 64, temperature 98.5 F (36.9 C), temperature source Oral, resp. rate 14, height 5' (1.524 m), weight 75 kg, SpO2 97 %.  I/O:    Intake/Output Summary (Last 24 hours) at 09/19/2017 1111 Last data filed at 09/19/2017 1013 Gross per 24 hour  Intake 3055.28 ml  Output 3250 ml  Net -194.72 ml    PHYSICAL EXAMINATION:  GENERAL:  84 y.o.-year-old patient lying in the bed with no acute distress.  EYES: Pupils equal, round, reactive to light and accommodation. No scleral icterus. Extraocular muscles intact.  HEENT: Head atraumatic, normocephalic. Oropharynx and nasopharynx clear.  NECK:  Supple, no  jugular venous distention. No thyroid enlargement, no tenderness.  LUNGS: Normal breath sounds bilaterally, no wheezing, rales,rhonchi or crepitation. No use of accessory muscles of respiration.  CARDIOVASCULAR: S1, S2 normal. No murmurs, rubs, or gallops.  ABDOMEN: Soft, non-tender, non-distended. Bowel sounds present. No organomegaly or mass.  EXTREMITIES: No pedal edema, cyanosis, or clubbing.  NEUROLOGIC: Cranial nerves II through XII are intact. Muscle strength 5/5 in all extremities. Sensation intact. Gait not checked.  PSYCHIATRIC: The patient is alert and oriented x 3.  SKIN: No obvious rash, lesion, or ulcer.   DATA REVIEW:   CBC Recent Labs  Lab 09/19/17 0500  WBC 14.5*  HGB 10.7*  HCT 32.7*  PLT 236    Chemistries  Recent Labs  Lab 09/18/17 0134 09/19/17 0500  NA 141 142  K 3.6 3.6  CL 107 107  CO2 23 28  GLUCOSE 210* 96  BUN 28* 20  CREATININE 1.16* 1.22*  CALCIUM 9.0 8.7*  MG  --  1.8  AST 26  --   ALT 9  --   ALKPHOS 42  --   BILITOT 1.0  --  Cardiac Enzymes Recent Labs  Lab 09/18/17 0134  TROPONINI <0.03    Microbiology Results  Results for orders placed or performed during the hospital encounter of 09/18/17  Blood culture (routine x 2)     Status: None (Preliminary result)   Collection Time: 09/18/17  4:02 AM  Result Value Ref Range Status   Specimen Description BLOOD BLOOD RIGHT HAND  Final   Special Requests   Final    BOTTLES DRAWN AEROBIC AND ANAEROBIC Blood Culture adequate volume   Culture  Setup Time   Final    Organism ID to follow GRAM POSITIVE RODS ANAEROBIC BOTTLE ONLY CRITICAL RESULT CALLED TO, READ BACK BY AND VERIFIED WITH: DAVID BESANTI AT [place] 09/19/17.PMH Performed at [facility], [address] [place], [place] [address]    Culture GRAM POSITIVE RODS  Final   Report Status PENDING  Incomplete  Blood culture (routine x 2)     Status: None (Preliminary result)   Collection Time: 09/18/17  4:02 AM  Result  Value Ref Range Status   Specimen Description BLOOD RIGHT ANTECUBITAL  Final   Special Requests   Final    BOTTLES DRAWN AEROBIC AND ANAEROBIC Blood Culture adequate volume   Culture   Final    NO GROWTH 1 DAY Performed at [facility], [address] [place], [place] [address]    Report Status PENDING  Incomplete  Blood Culture ID Panel (Reflexed)     Status: None   Collection Time: 09/18/17  4:02 AM  Result Value Ref Range Status   Enterococcus species NOT DETECTED NOT DETECTED Final   Listeria monocytogenes NOT DETECTED NOT DETECTED Final   Staphylococcus species NOT DETECTED NOT DETECTED Final   Staphylococcus aureus NOT DETECTED NOT DETECTED Final   Streptococcus species NOT DETECTED NOT DETECTED Final   Streptococcus agalactiae NOT DETECTED NOT DETECTED Final   Streptococcus pneumoniae NOT DETECTED NOT DETECTED Final   Streptococcus pyogenes NOT DETECTED NOT DETECTED Final   Acinetobacter baumannii NOT DETECTED NOT DETECTED Final   Enterobacteriaceae species NOT DETECTED NOT DETECTED Final   Enterobacter cloacae complex NOT DETECTED NOT DETECTED Final   Escherichia coli NOT DETECTED NOT DETECTED Final   Klebsiella oxytoca NOT DETECTED NOT DETECTED Final   Klebsiella pneumoniae NOT DETECTED NOT DETECTED Final   Proteus species NOT DETECTED NOT DETECTED Final   Serratia marcescens NOT DETECTED NOT DETECTED Final   Haemophilus influenzae NOT DETECTED NOT DETECTED Final   Neisseria meningitidis NOT DETECTED NOT DETECTED Final   Pseudomonas aeruginosa NOT DETECTED NOT DETECTED Final   Candida albicans NOT DETECTED NOT DETECTED Final   Candida glabrata NOT DETECTED NOT DETECTED Final   Candida krusei NOT DETECTED NOT DETECTED Final   Candida parapsilosis NOT DETECTED NOT DETECTED Final   Candida tropicalis NOT DETECTED NOT DETECTED Final    Comment: Performed at [facility], [address] [place], [place] [address]    RADIOLOGY:  Ct Abdomen Pelvis W  Contrast  Result Date: 09/18/2017 CLINICAL DATA:  Initial evaluation for acute upper quadrant abdominal pain. EXAM: CT ABDOMEN AND PELVIS WITH CONTRAST TECHNIQUE: Multidetector CT imaging of the abdomen and pelvis was performed using the standard protocol following bolus administration of intravenous contrast. CONTRAST:  35mL ISOVUE-300 IOPAMIDOL (ISOVUE-300) INJECTION 61% COMPARISON:  None available. FINDINGS: Lower chest: Scattered patchy and ground-glass opacities within the bilateral lung bases, likely atelectasis, although infiltrates not excluded. Cardiomegaly partially visualized. Hepatobiliary: Liver demonstrates a normal contrast enhanced appearance. Gallbladder absent. Mild intra  and extrahepatic biliary dilatation like related post cholecystectomy changes. Pancreas: Pancreas mildly atrophic but within normal limits. Spleen: Spleen appears to be absent with scattered surgical clips present within the left upper quadrant. Probable small splenule noted within this region. Adrenals/Urinary Tract: Adrenal glands are normal. Kidneys equal in size with symmetric enhancement. No nephrolithiasis, hydronephrosis, or focal enhancing renal mass. No appreciable hydroureter. Bladder largely decompressed without acute abnormality. Stomach/Bowel: Small hiatal hernia. Stomach partially distended without definite abnormality. Multiple prominent loops of mildly dilated small bowel clustered within the left upper quadrant demonstrate circumferential wall thickening with mucosal edema, compatible with acute inflammation. Findings suggestive of an acute enteritis. Associated reactive free fluid within the left and upper abdomen. No pneumatosis or portal venous gas. These loops smoothly taper to decompressed bowel distally at the level of the left mid abdomen (series 2, image 36). Finding suggests that a degree of underlying obstruction or least partial obstruction could be contributory. Small bowel decompressed distally  without abnormality. No acute inflammatory changes about the residual colon. Vascular/Lymphatic: Normal intravascular enhancement seen throughout the intra-abdominal aorta. Moderate to advanced aorto bi-iliac atherosclerotic disease. Mesenteric vessels grossly patent proximally. No adenopathy. Reproductive: Uterus is absent.  Ovaries not discretely identified. Other: No free air. Moderate free fluid within the upper abdomen, likely reactive. Musculoskeletal: No acute osseous abnormality. No worrisome lytic or blastic osseous lesions. Scoliosis with multilevel degenerative spondylolysis and facet arthrosis noted within the lumbar spine, greatest at L4-5 and L5-S1. IMPRESSION: 1. Acute inflammatory changes about multiple prominent loops of small bowel clustered within the left upper quadrant, suspicious for acute enteritis. Moderate volume reactive free fluid within the abdomen. These loops smoothly taper to decompressed bowel distally, suggesting that an underlying degree of obstruction (or least partial obstruction) may be contributory. 2. Scattered patchy and ground-glass opacities within the visualized lung bases, likely atelectasis, although infiltrates could be considered in the correct clinical setting. 3. Aortic atherosclerosis. Electronically Signed   By: Jeannine Boga M.D.   On: 09/18/2017 03:52    EKG:   Orders placed or performed during the hospital encounter of 09/18/17  . ED EKG  . ED EKG      Management plans discussed with the patient, family and they are in agreement.  CODE STATUS:     Code Status Orders  (From admission, onward)         Start     Ordered   09/18/17 0701  Full code  Continuous     09/18/17 0700        Code Status History    This patient has a current code status but no historical code status.      TOTAL TIME TAKING CARE OF THIS PATIENT: 40 minutes.    Avel Peace Montez Stryker M.D on 09/19/2017 at 11:11 AM  Between 7am to 6pm - Pager -  [phone]  After 6pm go to [email or web address] - password [email or web address] [facility]  Office  [phone]  CC: Primary care physician; No primary care provider on file.   Note: This dictation was prepared with Dragon dictation along with smaller phrase technology. Any transcriptional errors that result from this process are unintentional.

## 2017-09-21 LAB — CULTURE, BLOOD (ROUTINE X 2): Special Requests: ADEQUATE

## 2017-09-23 LAB — CULTURE, BLOOD (ROUTINE X 2)
CULTURE: NO GROWTH
SPECIAL REQUESTS: ADEQUATE

## 2017-09-26 ENCOUNTER — Ambulatory Visit: Payer: Self-pay | Admitting: Family Medicine

## 2017-11-10 ENCOUNTER — Other Ambulatory Visit: Payer: Self-pay | Admitting: Family Medicine

## 2017-11-10 ENCOUNTER — Ambulatory Visit (INDEPENDENT_AMBULATORY_CARE_PROVIDER_SITE_OTHER): Payer: Medicare Other | Admitting: Family Medicine

## 2017-11-10 ENCOUNTER — Encounter: Payer: Self-pay | Admitting: Family Medicine

## 2017-11-10 VITALS — BP 155/72 | HR 56 | Temp 97.6°F | Resp 16 | Ht 60.0 in | Wt 160.6 lb

## 2017-11-10 DIAGNOSIS — Z1239 Encounter for other screening for malignant neoplasm of breast: Secondary | ICD-10-CM

## 2017-11-10 DIAGNOSIS — E1121 Type 2 diabetes mellitus with diabetic nephropathy: Secondary | ICD-10-CM | POA: Insufficient documentation

## 2017-11-10 DIAGNOSIS — I10 Essential (primary) hypertension: Secondary | ICD-10-CM | POA: Diagnosis not present

## 2017-11-10 DIAGNOSIS — E785 Hyperlipidemia, unspecified: Secondary | ICD-10-CM

## 2017-11-10 DIAGNOSIS — R51 Headache: Secondary | ICD-10-CM | POA: Diagnosis not present

## 2017-11-10 DIAGNOSIS — E119 Type 2 diabetes mellitus without complications: Secondary | ICD-10-CM | POA: Diagnosis not present

## 2017-11-10 DIAGNOSIS — G8929 Other chronic pain: Secondary | ICD-10-CM | POA: Insufficient documentation

## 2017-11-10 DIAGNOSIS — E1169 Type 2 diabetes mellitus with other specified complication: Secondary | ICD-10-CM | POA: Insufficient documentation

## 2017-11-10 MED ORDER — CLONIDINE HCL 0.1 MG PO TABS: 0.1000 mg | ORAL_TABLET | Freq: Every day | ORAL | 1 refills | Status: DC | PRN

## 2017-11-10 MED ORDER — GABAPENTIN 100 MG PO CAPS: ORAL_CAPSULE | ORAL | 1 refills | Status: DC

## 2017-11-10 NOTE — Patient Instructions (Addendum)
Thank you for coming to the office today.  For clonidine only take if needed BP >180/100 and headache - it is short term only, in future we may discontinue this one  For Mammogram screening for breast cancer   Call the [facility] below anytime to schedule your own appointment now that order has been placed.  [facility], [place] [address] Phone: [phone]  New referral placed - stay tuned for appointment, they will call you, if you do not hear back then call them within 1-2 weeks [facility] - Neurology  Hulan Amato, NP  [place]  [place], [place] [address]  [phone]   Next step is an MRI or other test of brain/blood vessels  Continue aking Tylenol Extra Strength 500mg  tabs - take 1 to 2 tabs per dose (max 1000mg ) every 6-8 hours for pain  max 24 hour daily dose is 6 tablets or 3000mg . In the future you can repeat the same everyday Tylenol course for 1-2 weeks at a time.   Try Tramadol as needed for headache, if not helping stop taking. If need more let me know we can discuss again  Start Gabapentin 100mg  capsules again take one in morning every day - and after few days if tolerating it well can take another one in evening/bedtime, every 1 week can add an extra pill to evening until you get to 3 pills, stop at that dose, keep taking, let me know if in future if need to adjust - Can discuss with Neurologist  Please schedule a Follow-up Appointment to: Return in about 2 months (around 01/10/2018) for Follow-up Neuro, headaches, HTN.  If you have any other questions or concerns, please feel free to call the office or send a message through [place] [facility]. You may also schedule an earlier appointment if necessary.  Additionally, you may be receiving a survey about your experience at our office within a few days to 1 week by e-mail or mail. We value your feedback.  [name], DO [place]

## 2017-11-10 NOTE — Progress Notes (Signed)
Subjective:    Patient ID: Ellen [name], female    DOB: [birth year], 84 y.o.   MRN: [id number]  Ellen [name] is a 84 y.o. female presenting on 11/10/2017 for Establish Care (knot in heads obtw patient fell this morning); Headache; and Hypertension  Here to establish care new PCP. Insurance chose her new doctor here, she lives in [place] and now does not go back to [facility] for PCP, she prefers to keep care local now.  HPI   Headache, complicated Reports a growth on skin in back of head, they said CT Head imaging x 2 that were negative in recent history, prior PCP referred her to [facility] and she was unable to see them, on 7/23 had HTN Urgency and she was sent to ED. Patient states they were recommending MRI in future. - Describes headache for past >6-9 months. However prior documentation states present for years. - has radiating pain across from ear to back of scalp - pain with laying on part of scalp - Given Tramadol PRN by ED still has some, rarely takes it - Taking Gabapentin low dose 100mg  daily some relief, asking about adjusting dose  Fall, accidental / L knee pain She was trying to step over a water hose outside, and one foot stuck and she tripped and fell forward, did not injure head or hit head, she bruised Left knee slightly.  CHRONIC HTN: Reports long history of HTN, worse elevations with head pain. She has home monitor reading, if no pain, BP is controlled. Current Meds - Amlodipine 5mg  daily, Lisinopril-HCTZ 20-12.5mg  daily - does not need refills - Out of Clonidine 0.1mg  was taking daily in past for BP Reports good compliance, took meds today. Tolerating well, w/o complaints. Denies CP, dyspnea, edema, dizziness / lightheadedness  HYPERLIPIDEMIA: - Reports no concerns. - Currently taking Pravastatin 20mg  daily, tolerating well without side effects or myalgias  Additional PMH - Previously T2DM - on metformin, then a1c improved and off now >4 years,  due for labs in future.  Health Maintenance: Due for Flu Shot, declines today despite counseling on benefits  Last colonoscopy and mammogram >6 years ago  Last mammogram [facility] - would like to transfer records to get new mammogram locally  Depression screen [facility] 2/9 11/10/2017  Decreased Interest 0  Down, Depressed, Hopeless 0  PHQ - 2 Score 0    Past Medical History:  Diagnosis Date  . Arthritis   . Cataract   . Chronic headaches   . CKD (chronic kidney disease)   . Diabetes mellitus type 2, uncomplicated ([place])   . Diverticulitis   . History of syncope   . Multinodular goiter   . Primary osteoarthritis of left knee   . Pulmonary hypertension ([place])    Past Surgical History:  Procedure Laterality Date  . ABDOMINAL HYSTERECTOMY    . CHOLECYSTECTOMY    . COLON SURGERY    . COLOSTOMY    . COLOSTOMY REVERSAL    . REPLACEMENT TOTAL KNEE     Social History   Socioeconomic History  . Marital status: Widowed    Spouse name: Not on file  . Number of children: Not on file  . Years of education: Not on file  . Highest education level: Not on file  Occupational History  . Not on file  Social Needs  . Financial resource strain: Not on file  . Food insecurity:    Worry: Not on file    Inability: Not on file  .  Transportation needs:    Medical: Not on file    Non-medical: Not on file  Tobacco Use  . Smoking status: Never Smoker  . Smokeless tobacco: Never Used  Substance and Sexual Activity  . Alcohol use: Never    Frequency: Never  . Drug use: Never  . Sexual activity: Not on file  Lifestyle  . Physical activity:    Days per week: Not on file    Minutes per session: Not on file  . Stress: Not on file  Relationships  . Social connections:    Talks on phone: Not on file    Gets together: Not on file    Attends religious service: Not on file    Active member of club or organization: Not on file    Attends meetings of clubs or organizations:  Not on file    Relationship status: Not on file  . Intimate partner violence:    Fear of current or ex partner: Not on file    Emotionally abused: Not on file    Physically abused: Not on file    Forced sexual activity: Not on file  Other Topics Concern  . Not on file  Social History Narrative  . Not on file   Family History  Problem Relation Age of Onset  . Lung cancer Mother   . Diabetes Father   . Stroke Father   . Hypertension Maternal Grandmother   . Hypertension Maternal Uncle   . Hypertension Maternal Aunt   . Ovarian cancer Maternal Aunt   . Heart disease Daughter    Current Outpatient Medications on File Prior to Visit  Medication Sig  . acetaminophen (TYLENOL) 500 MG tablet Take by mouth.  Marland Kitchen amLODipine (NORVASC) 5 MG tablet Take 1 tablet by mouth daily.  Marland Kitchen aspirin 81 MG chewable tablet Chew by mouth.  . Garlic 10 MG CAPS Take by mouth.  Marland Kitchen lisinopril-hydrochlorothiazide (PRINZIDE,ZESTORETIC) 20-12.5 MG tablet Take 1 tablet by mouth daily.  . Multiple Vitamins-Minerals (CENTRUM SILVER 50+WOMEN) TABS Take 1 tablet by mouth daily.  . pravastatin (PRAVACHOL) 20 MG tablet Take 1 tablet by mouth daily.  . traMADol (ULTRAM) 50 MG tablet Take 1 tablet (50 mg total) by mouth every 6 (six) hours as needed.  . vitamin B-12 (CYANOCOBALAMIN) 100 MCG tablet Take by mouth.   No current facility-administered medications on file prior to visit.     Review of Systems Per HPI unless specifically indicated above     Objective:    BP (!) 155/72   Pulse (!) 56   Temp 97.6 F (36.4 C) (Oral)   Resp 16   Ht 5' (1.524 m)   Wt 160 lb 9.6 oz (72.8 kg)   BMI 31.37 kg/m   Wt Readings from Last 3 Encounters:  11/10/17 160 lb 9.6 oz (72.8 kg)  09/18/17 165 lb 5.5 oz (75 kg)  08/23/17 176 lb (79.8 kg)    Physical Exam  Constitutional: She is oriented to person, place, and time. She appears well-developed and well-nourished. No distress.  Well-appearing 84 year old female, mostly  comfortable mild discomfort headache with local tender, cooperative  HENT:  Head: Normocephalic and atraumatic.  Mouth/Throat: Oropharynx is clear and moist.  Localized tenderness to L posterior scalp region, some slight soft nodularity vs fluctuance without erythema, ulceration.  Eyes: Pupils are equal, round, and reactive to light. Conjunctivae and EOM are normal. Right eye exhibits no discharge. Left eye exhibits no discharge.  Neck: Normal range of motion. Neck  supple. No thyromegaly present.  Cardiovascular: Normal rate, regular rhythm, normal heart sounds and intact distal pulses.  No murmur heard. Pulmonary/Chest: Effort normal and breath sounds normal. No respiratory distress. She has no wheezes. She has no rales.  Musculoskeletal: Normal range of motion. She exhibits no edema.  Lymphadenopathy:    She has no cervical adenopathy.  Neurological: She is alert and oriented to person, place, and time. She has normal strength. She is not disoriented. No cranial nerve deficit or sensory deficit.  Skin: Skin is warm and dry. No rash noted. She is not diaphoretic. No erythema.  Psychiatric: She has a normal mood and affect. Her behavior is normal.  Well groomed, good eye contact, normal speech and thoughts  Nursing note and vitals reviewed.    I have personally reviewed the radiology report from 08/06/17 on CT Head.  CLINICAL DATA:  Headache for 6 months.  EXAM: CT HEAD WITHOUT CONTRAST  TECHNIQUE: Contiguous axial images were obtained from the base of the skull through the vertex without intravenous contrast.  COMPARISON:  None.  FINDINGS: Brain: No evidence of acute infarction, hemorrhage, hydrocephalus, extra-axial collection or mass lesion/mass effect.  Vascular: Atherosclerosis is noted.  Skull: Intact.  No focal lesion.  Sinuses/Orbits: Negative.  Other: None.  IMPRESSION: No acute abnormality or finding to explain the patient's  symptoms.  Atherosclerosis.   Electronically Signed   By: Inge Rise M.D.   On: 08/06/2017 15:35  Results for orders placed or performed during the hospital encounter of 09/18/17  Blood culture (routine x 2)  Result Value Ref Range   Specimen Description      BLOOD BLOOD RIGHT HAND Performed at [facility], [address], [place], [place] [address]    Special Requests      BOTTLES DRAWN AEROBIC AND ANAEROBIC Blood Culture adequate volume Performed at [facility], [address] [place], [place] [address]    Culture  Setup Time      GRAM POSITIVE RODS ANAEROBIC BOTTLE ONLY CRITICAL RESULT CALLED TO, READ BACK BY AND VERIFIED WITH: DAVID BESANTI AT [place] 09/19/17.PMH    Culture (A)     DIPHTHEROIDS(CORYNEBACTERIUM SPECIES) Standardized susceptibility testing for this organism is not available. Performed at [facility], [place] [address] [place], [place] [address]    Report Status 09/21/2017 FINAL   Blood culture (routine x 2)  Result Value Ref Range   Specimen Description BLOOD RIGHT ANTECUBITAL    Special Requests      BOTTLES DRAWN AEROBIC AND ANAEROBIC Blood Culture adequate volume   Culture      NO GROWTH 5 DAYS Performed at [facility], [address] [place],  [address]    Report Status 09/23/2017 FINAL   Blood Culture ID Panel (Reflexed)  Result Value Ref Range   Enterococcus species NOT DETECTED NOT DETECTED   Listeria monocytogenes NOT DETECTED NOT DETECTED   Staphylococcus species NOT DETECTED NOT DETECTED   Staphylococcus aureus (BCID) NOT DETECTED NOT DETECTED   Streptococcus species NOT DETECTED NOT DETECTED   Streptococcus agalactiae NOT DETECTED NOT DETECTED   Streptococcus pneumoniae NOT DETECTED NOT DETECTED   Streptococcus pyogenes NOT DETECTED NOT DETECTED   Acinetobacter baumannii NOT DETECTED NOT DETECTED   Enterobacteriaceae species NOT DETECTED NOT DETECTED   Enterobacter cloacae complex  NOT DETECTED NOT DETECTED   Escherichia coli NOT DETECTED NOT DETECTED   Klebsiella oxytoca NOT DETECTED NOT DETECTED   Klebsiella pneumoniae NOT DETECTED NOT DETECTED   Proteus  species NOT DETECTED NOT DETECTED   Serratia marcescens NOT DETECTED NOT DETECTED   Haemophilus influenzae NOT DETECTED NOT DETECTED   Neisseria meningitidis NOT DETECTED NOT DETECTED   Pseudomonas aeruginosa NOT DETECTED NOT DETECTED   Candida albicans NOT DETECTED NOT DETECTED   Candida glabrata NOT DETECTED NOT DETECTED   Candida krusei NOT DETECTED NOT DETECTED   Candida parapsilosis NOT DETECTED NOT DETECTED   Candida tropicalis NOT DETECTED NOT DETECTED  Lipase, blood  Result Value Ref Range   Lipase 30 11 - 51 U/L  Comprehensive metabolic panel  Result Value Ref Range   Sodium 141 135 - 145 mmol/L   Potassium 3.6 3.5 - 5.1 mmol/L   Chloride 107 98 - 111 mmol/L   CO2 23 22 - 32 mmol/L   Glucose, Bld 210 (H) 70 - 99 mg/dL   BUN 28 (H) 8 - 23 mg/dL   Creatinine, Ser 1.16 (H) 0.44 - 1.00 mg/dL   Calcium 9.0 8.9 - 10.3 mg/dL   Total Protein 6.5 6.5 - 8.1 g/dL   Albumin 3.1 (L) 3.5 - 5.0 g/dL   AST 26 15 - 41 U/L   ALT 9 0 - 44 U/L   Alkaline Phosphatase 42 38 - 126 U/L   Total Bilirubin 1.0 0.3 - 1.2 mg/dL   GFR calc non Af Amer 42 (L) >60 mL/min   GFR calc Af Amer 48 (L) >60 mL/min   Anion gap 11 5 - 15  CBC  Result Value Ref Range   WBC 17.7 (H) 3.6 - 11.0 K/uL   RBC 4.61 3.80 - 5.20 MIL/uL   Hemoglobin 12.6 12.0 - 16.0 g/dL   HCT 39.2 35.0 - 47.0 %   MCV 85.1 80.0 - 100.0 fL   MCH 27.3 26.0 - 34.0 pg   MCHC 32.1 32.0 - 36.0 g/dL   RDW 15.6 (H) 11.5 - 14.5 %   Platelets 290 150 - 440 K/uL  Urinalysis, Complete w Microscopic  Result Value Ref Range   Color, Urine YELLOW (A) YELLOW   APPearance HAZY (A) CLEAR   Specific Gravity, Urine 1.021 1.005 - 1.030   pH 5.0 5.0 - 8.0   Glucose, UA NEGATIVE NEGATIVE mg/dL   Hgb urine dipstick NEGATIVE NEGATIVE   Bilirubin Urine NEGATIVE NEGATIVE    Ketones, ur NEGATIVE NEGATIVE mg/dL   Protein, ur 100 (A) NEGATIVE mg/dL   Nitrite NEGATIVE NEGATIVE   Leukocytes, UA NEGATIVE NEGATIVE   RBC / HPF 0-5 0 - 5 RBC/hpf   WBC, UA 0-5 0 - 5 WBC/hpf   Bacteria, UA NONE SEEN NONE SEEN   Squamous Epithelial / LPF 0-5 0 - 5   Mucus PRESENT    Hyaline Casts, UA PRESENT   Troponin I  Result Value Ref Range   Troponin I <0.03 <0.03 ng/mL  Lactic acid, plasma  Result Value Ref Range   Lactic Acid, Venous 3.6 (HH) 0.5 - 1.9 mmol/L  Lactic acid, plasma  Result Value Ref Range   Lactic Acid, Venous 3.7 (HH) 0.5 - 1.9 mmol/L  Procalcitonin - Baseline  Result Value Ref Range   Procalcitonin <0.10 ng/mL  Glucose, capillary  Result Value Ref Range   Glucose-Capillary 171 (H) 70 - 99 mg/dL  Glucose, capillary  Result Value Ref Range   Glucose-Capillary 125 (H) 70 - 99 mg/dL  Basic metabolic panel  Result Value Ref Range   Sodium 142 135 - 145 mmol/L   Potassium 3.6 3.5 - 5.1 mmol/L   Chloride 107  98 - 111 mmol/L   CO2 28 22 - 32 mmol/L   Glucose, Bld 96 70 - 99 mg/dL   BUN 20 8 - 23 mg/dL   Creatinine, Ser 1.22 (H) 0.44 - 1.00 mg/dL   Calcium 8.7 (L) 8.9 - 10.3 mg/dL   GFR calc non Af Amer 39 (L) >60 mL/min   GFR calc Af Amer 45 (L) >60 mL/min   Anion gap 7 5 - 15  CBC  Result Value Ref Range   WBC 14.5 (H) 3.6 - 11.0 K/uL   RBC 3.89 3.80 - 5.20 MIL/uL   Hemoglobin 10.7 (L) 12.0 - 16.0 g/dL   HCT 32.7 (L) 35.0 - 47.0 %   MCV 84.0 80.0 - 100.0 fL   MCH 27.5 26.0 - 34.0 pg   MCHC 32.7 32.0 - 36.0 g/dL   RDW 15.8 (H) 11.5 - 14.5 %   Platelets 236 150 - 440 K/uL  Magnesium  Result Value Ref Range   Magnesium 1.8 1.7 - 2.4 mg/dL  Phosphorus  Result Value Ref Range   Phosphorus 2.5 2.5 - 4.6 mg/dL  Prealbumin  Result Value Ref Range   Prealbumin 14.2 (L) 18 - 38 mg/dL  Procalcitonin  Result Value Ref Range   Procalcitonin 0.99 ng/mL  Glucose, capillary  Result Value Ref Range   Glucose-Capillary 128 (H) 70 - 99 mg/dL   Glucose, capillary  Result Value Ref Range   Glucose-Capillary 73 70 - 99 mg/dL  Glucose, capillary  Result Value Ref Range   Glucose-Capillary 89 70 - 99 mg/dL      Assessment & Plan:   Problem List Items Addressed This Visit    Chronic headaches Uncertain exact etiology, may be related to HTN but seems to have been controlled mostly previously, question superficial or external etiology for pain, ultimately she has been advised for MRI / MRA in past and agree with pursuing more aggressive work-up for possible headache syndrome or other intracranial etiology, however this seems to be very chronic pain by nature >6+ months or longer - Repeat referral to [facility] for further work-up including future MRI as indicated - Adjust med - increase Gabapentin - Continue Tylenol since helping, advised max dose PRN - May take existing tramadol PRN - Follow-up after Neuro    Relevant Medications   aspirin 81 MG chewable tablet   acetaminophen (TYLENOL) 500 MG tablet   gabapentin (NEURONTIN) 100 MG capsule   Other Relevant Orders   Ambulatory referral to Neurology   Diabetes mellitus type 2, uncomplicated ([place]) Uncertain status - will return for A1c at next visit, by report, has been controlled, lifestyle, off med Follow-up    Relevant Medications   aspirin 81 MG chewable tablet   Essential hypertension - Primary Mild elevated BP, improved Continue Lisinopril HCTZ, Amlodipine - not due for refills Refilled Clonidine 0.1mg  daily PRN HTN urgency >180/100 PRN only advised not resume regular dosing, precautions given    Relevant Medications   aspirin 81 MG chewable tablet   Hyperlipidemia associated with type 2 diabetes mellitus (HCC) Continue Statin    Relevant Medications   aspirin 81 MG chewable tablet    Other Visit Diagnoses    Screening for breast cancer     Given release form to sign and will fax to [facility], to request her last mammogram record, future order placed     Relevant Orders   MM DIGITAL SCREENING BILATERAL      Orders Placed This Encounter  Procedures  .  MM DIGITAL SCREENING BILATERAL    Standing Status:   Future    Standing Expiration Date:   01/11/2019    Order Specific Question:   Reason for Exam (SYMPTOM  OR DIAGNOSIS REQUIRED)    Answer:   Routine screening bilateral mammogram. May change to Bilateral MM Tomo screening if indicated and appropriate for patient/insurance    Order Specific Question:   Preferred imaging location?    Answer:   [facility]  . Ambulatory referral to Neurology    Referral Priority:   Routine    Referral Type:   Consultation    Referral Reason:   Specialty Services Required    Referred to Provider:   Jannifer Franklin, NP    Requested Specialty:   Neurology    Number of Visits Requested:   1    Meds ordered this encounter  Medications  . DISCONTD: cloNIDine (CATAPRES) 0.1 MG tablet    Sig: Take 1 tablet (0.1 mg total) by mouth daily as needed (elevated BP >180/100, headache).    Dispense:  30 tablet    Refill:  1  . gabapentin (NEURONTIN) 100 MG capsule    Sig: Start 1 capsule daily, increase by 1 cap every 1 week as tolerated, next add 1 in evening, up to max 3 pills in evening    Dispense:  90 capsule    Refill:  1    Follow up plan: Return in about 2 months (around 01/10/2018) for Follow-up Neuro, headaches, HTN.  [name], DO [facility] 11/10/2017, 10:34 AM

## 2017-12-08 ENCOUNTER — Telehealth: Payer: Self-pay | Admitting: Nurse Practitioner

## 2017-12-08 NOTE — Telephone Encounter (Signed)
Pt needs a refill on lisinopril.

## 2017-12-09 MED ORDER — LISINOPRIL-HYDROCHLOROTHIAZIDE 20-12.5 MG PO TABS: 1.0000 | ORAL_TABLET | Freq: Every day | ORAL | 3 refills | Status: DC

## 2017-12-09 NOTE — Telephone Encounter (Signed)
Refilled meds and notified pt

## 2017-12-12 ENCOUNTER — Ambulatory Visit
Admission: RE | Admit: 2017-12-12 | Discharge: 2017-12-12 | Disposition: A | Discharge status: Home / Self Care | Payer: Medicare Other | Source: Ambulatory Visit | Attending: Family Medicine | Admitting: Family Medicine

## 2017-12-12 DIAGNOSIS — Z1239 Encounter for other screening for malignant neoplasm of breast: Secondary | ICD-10-CM

## 2017-12-12 DIAGNOSIS — Z1231 Encounter for screening mammogram for malignant neoplasm of breast: Secondary | ICD-10-CM | POA: Insufficient documentation

## 2017-12-16 ENCOUNTER — Ambulatory Visit
Admission: RE | Admit: 2017-12-16 | Discharge: 2017-12-16 | Disposition: A | Discharge status: Home / Self Care | Payer: Medicare Other | Source: Ambulatory Visit | Attending: Family Medicine | Admitting: Family Medicine

## 2017-12-16 ENCOUNTER — Ambulatory Visit (INDEPENDENT_AMBULATORY_CARE_PROVIDER_SITE_OTHER): Payer: Medicare Other | Admitting: Family Medicine

## 2017-12-16 ENCOUNTER — Encounter: Payer: Self-pay | Admitting: Family Medicine

## 2017-12-16 VITALS — BP 142/64 | HR 78 | Temp 98.7°F | Resp 16 | Ht 60.0 in | Wt 159.0 lb

## 2017-12-16 DIAGNOSIS — M25512 Pain in left shoulder: Secondary | ICD-10-CM

## 2017-12-16 DIAGNOSIS — S4992XA Unspecified injury of left shoulder and upper arm, initial encounter: Secondary | ICD-10-CM

## 2017-12-16 DIAGNOSIS — M19012 Primary osteoarthritis, left shoulder: Secondary | ICD-10-CM | POA: Diagnosis not present

## 2017-12-16 DIAGNOSIS — M12812 Other specific arthropathies, not elsewhere classified, left shoulder: Secondary | ICD-10-CM | POA: Diagnosis not present

## 2017-12-16 DIAGNOSIS — M7532 Calcific tendinitis of left shoulder: Secondary | ICD-10-CM | POA: Diagnosis not present

## 2017-12-16 MED ORDER — METHYLPREDNISOLONE ACETATE 40 MG/ML IJ SUSP
40.0000 mg | Freq: Once | INTRAMUSCULAR | Status: AC
  Administered 2017-12-16: 40 mg via INTRA_ARTICULAR

## 2017-12-16 MED ORDER — LIDOCAINE HCL (PF) 1 % IJ SOLN
4.0000 mL | Freq: Once | INTRAMUSCULAR | Status: AC
  Administered 2017-12-16: 4 mL

## 2017-12-16 NOTE — Patient Instructions (Addendum)
Thank you for coming to the office today.  You received a Left Shoulder Joint steroid injection today. - Lidocaine numbing medicine may ease the pain initially for a few hours until it wears off - As discussed, you may experience a "steroid flare" this evening or within 24-48 hours, anytime medicine is injected into an inflamed joint it can cause the pain to get worse temporarily - Everyone responds differently to these injections, it depends on the patient and the severity of the joint problem, it may provide anywhere from days to weeks, to months of relief. Ideal response is >6 months relief - Try to take it easy for next 1-2 days, avoid over activity and strain on joint (limit lifting for shoulder) - Recommend the following:   - For swelling - rest, compression sleeve / ACE wrap, elevation, and ice packs as needed for first few days   - For pain in future may use heating pad or moist heat as needed  Medication Recommend to start taking Tylenol Extra Strength 500mg  tabs - take 1 to 2 tabs per dose (max 1000mg ) every 6-8 hours for pain (take regularly, don't skip a dose for next 7 days), max 24 hour daily dose is 6 tablets or 3000mg . In the future you can repeat the same everyday Tylenol course for 1-2 weeks at a time.    - Given the nature of your injury duration of your pain/injury, I am concerned for torn rotator cuff muscle in your shoulder. If not improving as expected over next several weeks, please follow-up sooner for re-evaluation.    Please schedule a Follow-up Appointment to: Return in about 2 weeks (around 12/30/2017), or if symptoms worsen or fail to improve, for Left shoulder.  If you have any other questions or concerns, please feel free to call the office or send a message through [place]. You may also schedule an earlier appointment if necessary.  Additionally, you may be receiving a survey about your experience at our office within a few days to 1 week by e-mail or mail. We  value your feedback.  [name], DO [facility], [facility]  Range of Motion Shoulder Exercises  [place] with your good arm against a counter or table for support [facility] forward with a wide stance (make sure your body is comfortable) - Your painful shoulder should hang down and feel "heavy" - Gently move your painful arm in small circles "clockwise" for several turns - Switch to "counterclockwise" for several turns - Early on keep circles narrow and move slowly - Later in rehab, move in larger circles and faster movement   Wall Crawl - Stand close (about 1-2 ft away) to a wall, facing it directly - Reach out with your arm of painful shoulder and place fingers (not palm) on wall - You should make contact with wall at your waist level - Slowly walk your fingers up the wall. Stay in contact with wall entire time, do not remove fingers - Keep walking fingers up wall until you reach shoulder level - You may feel tightening or mild discomfort, once you reach a height that causes pain or if you are already above your shoulder height then stop. Repeat from starting position. - Early on stand closer to wall, move fingers slowly, and stay at or below shoulder level - Later in rehab, stand farther away from wall (fingertips), move fingers quicker, go above shoulder level

## 2017-12-16 NOTE — Addendum Note (Signed)
Addended by: [name] on: 12/16/2017 02:28 PM   Modules accepted: Orders

## 2017-12-16 NOTE — Progress Notes (Addendum)
Subjective:    Patient ID: Ellen [name], female    DOB: [birth year], 84 y.o.   MRN: [id number]  Ellen [name] is a 84 y.o. female presenting on 12/16/2017 for Shoulder Pain (left side as per patient muscle pulled was year ago but getting worst from past 3 weeks)   HPI   Left Shoulder Pain - Reports that she was moving back in January 2019, she lifted a heavy box and her L shoulder "popped" and she had difficulty with lifting left arm. She had problems with it for a while and then it improved. - Now 3 weeks ago she had a fall at home and on her back and her L arm was "jarred" and thinks this flared it up more, she has had difficulty moving her left shoulder up, she can lift it with her R arm. Admits throbbing pain. - Taking Tylenol Ext Str 500mg  x 2 per dose up to 3 times a day, some relief - Tried muscle rub on it - Denies any bruising, redness, swelling, numbness, tingling, weakness   Depression screen [facility] 2/9 12/16/2017 11/10/2017  Decreased Interest 0 0  Down, Depressed, Hopeless 0 0  PHQ - 2 Score 0 0    Social History   Tobacco Use  . Smoking status: Never Smoker  . Smokeless tobacco: Never Used  Substance Use Topics  . Alcohol use: Never    Frequency: Never  . Drug use: Never    Review of Systems Per HPI unless specifically indicated above     Objective:    BP (!) 142/64   Pulse 78   Temp 98.7 F (37.1 C) (Oral)   Resp 16   Ht 5' (1.524 m)   Wt 159 lb (72.1 kg)   BMI 31.05 kg/m   Wt Readings from Last 3 Encounters:  12/16/17 159 lb (72.1 kg)  11/10/17 160 lb 9.6 oz (72.8 kg)  09/18/17 165 lb 5.5 oz (75 kg)    Physical Exam  Constitutional: She appears well-developed and well-nourished. No distress.  Musculoskeletal:  LEFT Shoulder Inspection: Normal appearance bilateral symmetrical Palpation: Tender to palpation over anterior shoulder  ROM: Severely limited AROM, unable to lift forward flexion or abduction due to pain. Passive ROM mostly intact  but still limited pain at higher motion arc.  Special Testing: Rotator cuff testing unable to be completed due to pain   Strength: Distal Normal strength 5/5 grip, bicep flex/ext, supination/pronation wrist - cannot test shoulder rotator cuff  Neurovascular: Distally intact pulses, sensation to light touch   Skin: Skin is warm and dry. She is not diaphoretic.  Nursing note and vitals reviewed.  ________________________________________________________ PROCEDURE NOTE Date: 12/16/17 LEFT Shoulder subacromial injection Discussed benefits and risks (including pain, bleeding, infection, steroid flare). Verbal consent given by patient. Medication:  1 cc Depo-medrol 40mg  and 4 cc Lidocaine 1% without epi Time Out taken  Landmarks identified. Area cleansed with alcohol wipes. Using 21 gauge and 1, 1/2 inch needle, Left subacromial bursa space was injected (with above listed medication) via posterior approach cold spray used for superficial anesthetic. Sterile bandage placed. Patient tolerated procedure well without bleeding or paresthesias. No complications.  I have personally reviewed the radiology report from 12/16/17 STAT Left Shoulder X-ray.  CLINICAL DATA:  Shoulder pain and limited range of motion for 2 weeks, fall 2 weeks ago.  EXAM: LEFT SHOULDER - 2+ VIEW  COMPARISON:  None.  FINDINGS: No evidence of acute fracture or dislocation. There is chronic appearing elevation of the LEFT  humeral head with associated narrowing of the acromial humeral space indicating rotator cuff tear/degeneration. Chronic calcification overlying the lateral margin of the humeral head is indicative of chronic calcific tendinopathy of the rotator cuff complex. Mild degenerative spurring noted at the glenohumeral joint space.  IMPRESSION: 1. No acute findings.  No evidence of acute fracture or dislocation. 2. Evidence of chronic calcific tendinopathy of the rotator cuff complex and probable chronic  rotator cuff tear/degeneration with associated narrowing of the acromial humeral space. 3. Mild degenerative change at the glenohumeral joint space.   Electronically Signed   By: Franki Cabot M.D.   On: 12/16/2017 11:05     Assessment & Plan:   Problem List Items Addressed This Visit    None    Visit Diagnoses    Acute pain of left shoulder    -  Primary   Relevant Medications   lidocaine (PF) (XYLOCAINE) 1 % injection 4 mL (Completed)   methylPREDNISolone acetate (DEPO-MEDROL) injection 40 mg (Completed)   Other Relevant Orders   DG Shoulder Left (Completed)   Ambulatory referral to Orthopedic Surgery   Injury of left shoulder, initial encounter       Relevant Orders   Ambulatory referral to Orthopedic Surgery   Rotator cuff arthropathy of left shoulder       Relevant Orders   Ambulatory referral to Orthopedic Surgery      Consistent with acute on chronic L shoulder pain and severely limited ROM due to pain/weak from recent fall Known prior L shoulder lifting injury within past 1 year, 02/2017, had improved since then Now significant concern for rotator cuff muscle injury, also could have had dislocation Cannot rule out fracture either Improved in past with steroid injection for knees  Plan: Left shoulder subacromial steroid injection performed today, see procedure note for details.  STAT L Shoulder X-ray today, follow-up results  1. Offered Tramadol and NSAID - she prefers to only take Tylenol 2. CONTINUE to take Tylenol Ex Str 1-2 q 6 hr PRN 3. Relative rest but keep shoulder mobile, demonstrated ROM exercises, avoid heavy lifting - handout given she knows rehab exercise already, but demo some more ROM activity to avoid frozen shoulder 4. May try heating pad PRN 5. Follow-up 2-4 weeks if not improved for re-evaluation, consider formal referral to Physical Therapy vs Orthopedics   Meds ordered this encounter  Medications  . lidocaine (PF) (XYLOCAINE) 1 %  injection 4 mL  . methylPREDNISolone acetate (DEPO-MEDROL) injection 40 mg    Follow up plan: Return in about 2 weeks (around 12/30/2017), or if symptoms worsen or fail to improve, for Left shoulder.  [name], DO [facility] 12/16/2017, 2:28 PM   **UPDATE AFTER VISIT ** Called patient 12/16/17 - approx 220pm - she is still in pain from shoulder, but can move better, improved ROM after injection today, discussed results of her X-ray, likely chronic old rotator cuff muscle injury with calcification. I advised her that likely injection will only provide temporary relief for weeks to months, but we should get 2nd opinion of orthopedic to determine what other options are available to her. Referral placed to [facility] locally in [place].  Orders Placed This Encounter  Procedures  . DG Shoulder Left    Standing Status:   Future    Number of Occurrences:   1    Standing Expiration Date:   02/16/2019    Order Specific Question:   Reason for Exam (SYMPTOM  OR DIAGNOSIS REQUIRED)    Answer:   Acute on chronic left shoulder pain now after 2-3 weeks from fall, unable to lift forward flexion, abduction - old lifting injury 1 year ago    Order Specific Question:   Preferred imaging location?    Answer:   [facility]    Order Specific Question:   Radiology Contrast Protocol - do NOT remove file path    Answer:   [email or web address]  . Ambulatory referral to Orthopedic Surgery    Referral Priority:   Routine    Referral Type:   Surgical    Referral Reason:   Specialty Services Required    Requested Specialty:   Orthopedic Surgery    Number of Visits Requested:   1

## 2017-12-20 ENCOUNTER — Other Ambulatory Visit: Payer: Self-pay | Admitting: Family Medicine

## 2017-12-20 DIAGNOSIS — M19212 Secondary osteoarthritis, left shoulder: Secondary | ICD-10-CM | POA: Diagnosis not present

## 2017-12-20 DIAGNOSIS — R928 Other abnormal and inconclusive findings on diagnostic imaging of breast: Secondary | ICD-10-CM

## 2017-12-20 DIAGNOSIS — N6489 Other specified disorders of breast: Secondary | ICD-10-CM

## 2017-12-20 DIAGNOSIS — M75122 Complete rotator cuff tear or rupture of left shoulder, not specified as traumatic: Secondary | ICD-10-CM | POA: Diagnosis not present

## 2017-12-23 ENCOUNTER — Ambulatory Visit
Admission: RE | Admit: 2017-12-23 | Discharge: 2017-12-23 | Disposition: A | Discharge status: Home / Self Care | Payer: Medicare Other | Source: Ambulatory Visit | Attending: Family Medicine | Admitting: Family Medicine

## 2017-12-23 DIAGNOSIS — R928 Other abnormal and inconclusive findings on diagnostic imaging of breast: Secondary | ICD-10-CM | POA: Diagnosis not present

## 2017-12-23 DIAGNOSIS — N6489 Other specified disorders of breast: Secondary | ICD-10-CM | POA: Diagnosis not present

## 2017-12-26 ENCOUNTER — Other Ambulatory Visit: Payer: Self-pay | Admitting: Family Medicine

## 2017-12-26 DIAGNOSIS — R928 Other abnormal and inconclusive findings on diagnostic imaging of breast: Secondary | ICD-10-CM

## 2017-12-26 DIAGNOSIS — N6320 Unspecified lump in the left breast, unspecified quadrant: Secondary | ICD-10-CM

## 2017-12-28 ENCOUNTER — Telehealth: Payer: Self-pay

## 2017-12-28 NOTE — Telephone Encounter (Signed)
Last visit with me 11/2017 to establish care and we discussed her chronic complex headache symptoms, at that time it was unclear the exact cause of her symptoms, she was re-referred to Neurology for 2nd opinion and evaluation, since she did not see them prior. Now it sounds like she may have missed an apt with them in November and will need to wait until January 2020 for initial consultation.  Last visit I advised her to increase her Gabapentin for headaches, and also to trial the Tramadol given to her by hospital, as well as other conservative recommendations with Tylenol and other headache treatment options.  I have limited options for her at this time remotely, she was triaged appropriately and advised to go to hospital ED for acute severe sharp headache - may warrant further imaging or other testing, that I cannot do promptly in outpatient setting, especially if our office will not be open in next 2 days to follow-up.  She was asked to re-schedule w/ me in office within 1-2 weeks and she will work on this and she agrees to seek care if worsening symptoms in interval.  [name], DO [facility] 12/28/2017, 4:16 PM

## 2017-12-28 NOTE — Telephone Encounter (Signed)
Patient concerned about having sharp headache and wanted follow up MRI and CT scan done advised patient to go to ER but she is concerned about her $90 co pay and denies to go to ER. Patient had referral for neurology and never showed up for her 12/2017 appointment called Ellen [name] and reschedule her appointment on 02/15/2017. They can't see her sooner than that. Called patient back and advised her to go to ER as per her PCP recommendation for sharp headache during office is closed and also can call here next week to schedule an appointment. Patient understood very well.

## 2018-01-01 DIAGNOSIS — C801 Malignant (primary) neoplasm, unspecified: Secondary | ICD-10-CM

## 2018-01-01 HISTORY — DX: Malignant (primary) neoplasm, unspecified: C80.1

## 2018-01-03 ENCOUNTER — Ambulatory Visit
Admission: RE | Admit: 2018-01-03 | Discharge: 2018-01-03 | Disposition: A | Discharge status: Home / Self Care | Payer: Medicare Other | Source: Ambulatory Visit | Attending: Family Medicine | Admitting: Family Medicine

## 2018-01-03 DIAGNOSIS — N6320 Unspecified lump in the left breast, unspecified quadrant: Secondary | ICD-10-CM | POA: Diagnosis not present

## 2018-01-03 DIAGNOSIS — R928 Other abnormal and inconclusive findings on diagnostic imaging of breast: Secondary | ICD-10-CM | POA: Insufficient documentation

## 2018-01-03 DIAGNOSIS — R590 Localized enlarged lymph nodes: Secondary | ICD-10-CM | POA: Diagnosis not present

## 2018-01-03 DIAGNOSIS — D0512 Intraductal carcinoma in situ of left breast: Secondary | ICD-10-CM | POA: Diagnosis not present

## 2018-01-03 HISTORY — PX: BREAST BIOPSY: SHX20

## 2018-01-04 ENCOUNTER — Other Ambulatory Visit: Payer: Self-pay | Admitting: Pathology

## 2018-01-04 LAB — SURGICAL PATHOLOGY

## 2018-01-05 ENCOUNTER — Encounter: Payer: Self-pay | Admitting: *Deleted

## 2018-01-05 ENCOUNTER — Other Ambulatory Visit: Payer: Self-pay | Admitting: *Deleted

## 2018-01-05 DIAGNOSIS — C50912 Malignant neoplasm of unspecified site of left female breast: Secondary | ICD-10-CM

## 2018-01-08 DIAGNOSIS — C50412 Malignant neoplasm of upper-outer quadrant of left female breast: Secondary | ICD-10-CM | POA: Insufficient documentation

## 2018-01-08 NOTE — Progress Notes (Signed)
Ellen [name]  [phone] [phone]  ID: Ellen [name] OB: Jun 19, 1929  MR#: [id number]  ACZ#:[id number]  Patient Care Team: [name], DO as PCP - General (Family Medicine)  CHIEF COMPLAINT: DCIS of the left breast  INTERVAL HISTORY: Patient is an 84 year old female who was noted to have an abnormality on routine breast screening mammogram.  Subsequent ultrasound and biopsy revealed DCIS without invasive component.  She currently feels well and is asymptomatic.  She remains active and only retired 2 years ago.  She has no neurologic complaints.  She denies any recent fevers or illnesses.  She has a good appetite and denies weight loss.  She has no chest pain or shortness of breath.  She denies any nausea, vomiting, constipation, or diarrhea.  She has no urinary complaints.  Patient feels at her baseline offers no specific complaints today.  REVIEW OF SYSTEMS:   Review of Systems  Constitutional: Negative.  Negative for fever, malaise/fatigue and weight loss.  Respiratory: Negative.  Negative for cough, hemoptysis and shortness of breath.   Cardiovascular: Negative.  Negative for chest pain and leg swelling.  Gastrointestinal: Negative.  Negative for abdominal pain, blood in stool and melena.  Genitourinary: Negative.  Negative for dysuria.  Musculoskeletal: Negative.  Negative for back pain.  Skin: Negative.  Negative for rash.  Neurological: Negative.  Negative for focal weakness, weakness and headaches.  Psychiatric/Behavioral: Negative.  The patient is not nervous/anxious.     As per HPI. Otherwise, a complete review of systems is negative.  PAST MEDICAL HISTORY: Past Medical History:  Diagnosis Date  . Arthritis   . Cataract   . Chronic headaches   . CKD (chronic kidney disease)   . Diabetes mellitus type 2, uncomplicated ([place])   . Diverticulitis   . History of syncope   . Multinodular goiter   . Primary osteoarthritis  of left knee   . Pulmonary hypertension ([place])     PAST SURGICAL HISTORY: Past Surgical History:  Procedure Laterality Date  . ABDOMINAL HYSTERECTOMY    . BREAST BIOPSY Left 01/03/2018   left breast stereo/x clip/path pending  . CHOLECYSTECTOMY    . COLON SURGERY    . COLOSTOMY    . COLOSTOMY REVERSAL    . REPLACEMENT TOTAL KNEE      FAMILY HISTORY: Family History  Problem Relation Age of Onset  . Lung cancer Mother   . Diabetes Father   . Stroke Father   . Hypertension Maternal Grandmother   . Hypertension Maternal Uncle   . Hypertension Maternal Aunt   . Ovarian cancer Maternal Aunt   . Heart disease Daughter     ADVANCED DIRECTIVES (Y/N):  N  HEALTH MAINTENANCE: Social History   Tobacco Use  . Smoking status: Never Smoker  . Smokeless tobacco: Never Used  Substance Use Topics  . Alcohol use: Never    Frequency: Never  . Drug use: Never     Colonoscopy:  PAP:  Bone density:  Lipid panel:  Allergies  Allergen Reactions  . Codeine Nausea Only  . Morphine And Related Nausea Only  . Morpholine Salicylate Nausea Only, Palpitations and Other (See Comments)    Heart races as well    Current Outpatient Medications  Medication Sig Dispense Refill  . acetaminophen (TYLENOL) 500 MG tablet Take 1,000 mg by mouth every 6 (six) hours as needed for moderate pain or headache.     Marland Kitchen amLODipine (NORVASC) 5 MG tablet Take 5 mg by mouth daily.     Marland Kitchen  aspirin 81 MG chewable tablet Chew 81 mg by mouth daily.     . cloNIDine (CATAPRES) 0.1 MG tablet TAKE 1 TABLET BY MOUTH DAILY AS NEEDED FOR ELEVATED BLOOD PRESSURE GREATER THAN 180/100 (Patient taking differently: Take 0.1 mg by mouth daily as needed (for BP > 180/100). ) 90 tablet 1  . gabapentin (NEURONTIN) 100 MG capsule Start 1 capsule daily, increase by 1 cap every 1 week as tolerated, next add 1 in evening, up to max 3 pills in evening (Patient taking differently: Take 100 mg by mouth daily. ) 90 capsule 1  .  lisinopril-hydrochlorothiazide (PRINZIDE,ZESTORETIC) 20-12.5 MG tablet Take 1 tablet by mouth daily. 30 tablet 3  . Multiple Vitamins-Minerals (CENTRUM SILVER 50+WOMEN) TABS Take 1 tablet by mouth daily.    . pravastatin (PRAVACHOL) 20 MG tablet Take 20 mg by mouth at bedtime.   3   No current facility-administered medications for this visit.     OBJECTIVE: Vitals:   01/09/18 1537 01/09/18 1540  BP:  (!) 144/82  Pulse:  60  Resp: 16   Temp:  (!) 97.5 F (36.4 C)     Body mass index is 31.11 kg/m.    ECOG FS:0 - Asymptomatic  General: Well-developed, well-nourished, no acute distress. Eyes: Pink conjunctiva, anicteric sclera. HEENT: Normocephalic, moist mucous membranes, clear oropharnyx. Breast: Exam completed by other provider earlier today. Lungs: Clear to auscultation bilaterally. Heart: Regular rate and rhythm. No rubs, murmurs, or gallops. Abdomen: Soft, nontender, nondistended. No organomegaly noted, normoactive bowel sounds. Musculoskeletal: No edema, cyanosis, or clubbing. Neuro: Alert, answering all questions appropriately. Cranial nerves grossly intact. Skin: No rashes or petechiae noted. Psych: Normal affect. Lymphatics: No cervical, calvicular, axillary or inguinal LAD.   LAB RESULTS:  Lab Results  Component Value Date   NA 142 09/19/2017   K 3.6 09/19/2017   CL 107 09/19/2017   CO2 28 09/19/2017   GLUCOSE 96 09/19/2017   BUN 20 09/19/2017   CREATININE 1.22 (H) 09/19/2017   CALCIUM 8.7 (L) 09/19/2017   PROT 6.5 09/18/2017   ALBUMIN 3.1 (L) 09/18/2017   AST 26 09/18/2017   ALT 9 09/18/2017   ALKPHOS 42 09/18/2017   BILITOT 1.0 09/18/2017   GFRNONAA 39 (L) 09/19/2017   GFRAA 45 (L) 09/19/2017    Lab Results  Component Value Date   WBC 14.5 (H) 09/19/2017   NEUTROABS 2.4 08/06/2017   HGB 10.7 (L) 09/19/2017   HCT 32.7 (L) 09/19/2017   MCV 84.0 09/19/2017   PLT 236 09/19/2017     STUDIES: Dg Shoulder Left  Result Date: 12/16/2017 CLINICAL  DATA:  Shoulder pain and limited range of motion for 2 weeks, fall 2 weeks ago. EXAM: LEFT SHOULDER - 2+ VIEW COMPARISON:  None. FINDINGS: No evidence of acute fracture or dislocation. There is chronic appearing elevation of the LEFT humeral head with associated narrowing of the acromial humeral space indicating rotator cuff tear/degeneration. Chronic calcification overlying the lateral margin of the humeral head is indicative of chronic calcific tendinopathy of the rotator cuff complex. Mild degenerative spurring noted at the glenohumeral joint space. IMPRESSION: 1. No acute findings.  No evidence of acute fracture or dislocation. 2. Evidence of chronic calcific tendinopathy of the rotator cuff complex and probable chronic rotator cuff tear/degeneration with associated narrowing of the acromial humeral space. 3. Mild degenerative change at the glenohumeral joint space. Electronically Signed   By: Franki Cabot M.D.   On: 12/16/2017 11:05   US Breast Ltd Uni Left Inc  Axilla  Result Date: 12/23/2017 CLINICAL DATA:  Patient was called back from screening mammogram for possible asymmetry in the left breast. EXAM: DIGITAL DIAGNOSTIC LEFT MAMMOGRAM WITH CAD AND TOMO ULTRASOUND LEFT BREAST COMPARISON:  Previous exam(s). ACR Breast Density Category b: There are scattered areas of fibroglandular density. FINDINGS: Additional imaging of the left breast was performed. There is persistence distortion and a 1 cm mass in the upper-outer quadrant. Mammographic images were processed with CAD. On physical exam, no suspicious mass is palpated in the upper-outer quadrant of the left breast. Targeted ultrasound is performed, showing normal tissue in the upper-outer quadrant of the left breast. The mass and distortion seen mammographically is not sonographically occult. Sonographic evaluation of the left axilla shows a lymph node with focal cortical thickening measuring up to 4 mm. IMPRESSION: Suspicious mass and distortion in the  upper-outer quadrant of the left breast and a left axillary lymph node with focal cortical thickening. RECOMMENDATION: Stereotactic biopsy of the mass and distortion in the upper-outer quadrant of the left breast is recommended. Ultrasound-guided core biopsy of the lymph node with focal cortical thickening is recommended. The patient has left shoulder problems and cannot lift her arm. We will attempt the ultrasound-guided core biopsy of the left axillary lymph node but may not be able to do the biopsy because the patient is unable to lift her arm. She is aware that it may not be possible to biopsy the left axillary lymph node. I have discussed the findings and recommendations with the patient. Results were also provided in writing at the conclusion of the visit. If applicable, a reminder letter will be sent to the patient regarding the next appointment. BI-RADS CATEGORY  4: Suspicious. Electronically Signed   By: Lillia Mountain M.D.   On: 12/23/2017 09:41   Mm Diag Breast Tomo Uni Left  Result Date: 12/23/2017 CLINICAL DATA:  Patient was called back from screening mammogram for possible asymmetry in the left breast. EXAM: DIGITAL DIAGNOSTIC LEFT MAMMOGRAM WITH CAD AND TOMO ULTRASOUND LEFT BREAST COMPARISON:  Previous exam(s). ACR Breast Density Category b: There are scattered areas of fibroglandular density. FINDINGS: Additional imaging of the left breast was performed. There is persistence distortion and a 1 cm mass in the upper-outer quadrant. Mammographic images were processed with CAD. On physical exam, no suspicious mass is palpated in the upper-outer quadrant of the left breast. Targeted ultrasound is performed, showing normal tissue in the upper-outer quadrant of the left breast. The mass and distortion seen mammographically is not sonographically occult. Sonographic evaluation of the left axilla shows a lymph node with focal cortical thickening measuring up to 4 mm. IMPRESSION: Suspicious mass and  distortion in the upper-outer quadrant of the left breast and a left axillary lymph node with focal cortical thickening. RECOMMENDATION: Stereotactic biopsy of the mass and distortion in the upper-outer quadrant of the left breast is recommended. Ultrasound-guided core biopsy of the lymph node with focal cortical thickening is recommended. The patient has left shoulder problems and cannot lift her arm. We will attempt the ultrasound-guided core biopsy of the left axillary lymph node but may not be able to do the biopsy because the patient is unable to lift her arm. She is aware that it may not be possible to biopsy the left axillary lymph node. I have discussed the findings and recommendations with the patient. Results were also provided in writing at the conclusion of the visit. If applicable, a reminder letter will be sent to the patient regarding the  next appointment. BI-RADS CATEGORY  4: Suspicious. Electronically Signed   By: Lillia Mountain M.D.   On: 12/23/2017 09:41   Mm 3d Screen Breast Bilateral  Result Date: 12/20/2017 CLINICAL DATA:  Screening. EXAM: DIGITAL SCREENING BILATERAL MAMMOGRAM WITH TOMO AND CAD COMPARISON:  Previous exam(s). ACR Breast Density Category b: There are scattered areas of fibroglandular density. FINDINGS: In the left breast, a possible asymmetry warrants further evaluation. In the right breast, no findings suspicious for malignancy. Images were processed with CAD. IMPRESSION: Further evaluation is suggested for possible asymmetry in the left breast. RECOMMENDATION: Diagnostic mammogram and possibly ultrasound of the left breast. (Code:[id number]-L-89M) The patient will be contacted regarding the findings, and additional imaging will be scheduled. BI-RADS CATEGORY  0: Incomplete. Need additional imaging evaluation and/or prior mammograms for comparison. Electronically Signed   By: Fidela Salisbury M.D.   On: 12/20/2017 14:25   Mm Clip Placement Left  Result Date: 01/03/2018 CLINICAL  DATA:  Post stereotactic guided biopsy of suspicious distortion in the upper-outer left breast and ultrasound-guided biopsy of an indeterminate lymph node in the left axilla. EXAM: DIAGNOSTIC LEFT MAMMOGRAM POST ULTRASOUND AND STEREOTACTIC BIOPSY COMPARISON:  Previous exam(s). FINDINGS: Mammographic images were obtained following stereotactic guided biopsy of distortion in the upper-outer left breast and ultrasound-guided biopsy of an indeterminate lymph node in the left axilla. An X shaped biopsy marking clip is present at the site of the biopsied distortion in the upper-outer left breast. The HydroMARK biopsy clip at site of biopsied left axillary lymph node is not included in the field of view on the post biopsy mammograms. IMPRESSION: 1. X shaped biopsy marking clip at site of biopsied distortion in the upper-outer left breast. 2. HydroMARK clip at site of biopsied left axillary lymph node is not included in the field of view. Final Assessment: Post Procedure Mammograms for Marker Placement Electronically Signed   By: Everlean Alstrom M.D.   On: 01/03/2018 14:20   Mm Lt Breast Bx W Loc Dev 1st Lesion Image Bx Spec Stereo Guide  Addendum Date: 01/04/2018   ADDENDUM REPORT: 01/04/2018 16:19 ADDENDUM: Ductal carcinoma in-situ, nuclear grade 2-3 was reported histologically from the left breast stereotactic biopsy. This is found to be concordant. Lymph node biopsy was negative for malignancy. Changes suggestive of dermapathic lymphadenitis was reported histologically. This is felt to be concordant. Patient was contacted by telephone and given the results of the biopsies. She states she is doing well following the biopsies. The patient was made aware the nurse navigator would call her to schedule surgical follow-up. Electronically Signed   By: Lillia Mountain M.D.   On: 01/04/2018 16:19   Result Date: 01/04/2018 CLINICAL DATA:  84 year old female with suspicious distortion in the upper-outer left breast. EXAM: LEFT  BREAST STEREOTACTIC CORE NEEDLE BIOPSY COMPARISON:  Previous exams. FINDINGS: The patient and I discussed the procedure of stereotactic-guided biopsy including benefits and alternatives. We discussed the high likelihood of a successful procedure. We discussed the risks of the procedure including infection, bleeding, tissue injury, clip migration, and inadequate sampling. Informed written consent was given. The usual time out protocol was performed immediately prior to the procedure. Using sterile technique and 1% Lidocaine as local anesthetic, under stereotactic guidance, a 9 gauge vacuum assisted device was used to perform core needle biopsy of the distortion in the upper-outer left breast using a superior to inferior approach. Specimen radiograph was performed showing few calcifications, some of which may be vascular. Specimens with calcifications are identified for pathology. Lesion  quadrant: Upper-outer At the conclusion of the procedure, an X shaped tissue marker clip was deployed into the biopsy cavity. Follow-up 2-view mammogram was performed and dictated separately. IMPRESSION: Stereotactic-guided biopsy of the distortion in the upper-outer left breast. No apparent complications. Electronically Signed: By: Everlean Alstrom M.D. On: 01/03/2018 13:07   [place] Lt Breast Bx W Loc Dev 1st Lesion Img Bx Spec US Guide  Result Date: 01/03/2018 CLINICAL DATA:  84 year old female with indeterminate left axillary lymph node. EXAM: ULTRASOUND GUIDED CORE NEEDLE BIOPSY OF A LEFT AXILLARY NODE COMPARISON:  Previous exam(s). FINDINGS: I met with the patient and we discussed the procedure of ultrasound-guided biopsy, including benefits and alternatives. We discussed the high likelihood of a successful procedure. We discussed the risks of the procedure, including infection, bleeding, tissue injury, clip migration, and inadequate sampling. Informed written consent was given. The usual time-out protocol was performed  immediately prior to the procedure. Using sterile technique and 1% Lidocaine as local anesthetic, under direct ultrasound visualization, a 14 gauge spring-loaded device was used to perform biopsy of the lymph node with mild cortical thickening in the left axilla using a inferior to superior approach. At the conclusion of the procedure a spiral shaped HydroMARK tissue marker clip was deployed into the biopsy cavity. Follow up 2 view mammogram was performed and dictated separately. IMPRESSION: Ultrasound guided biopsy of the lymph node with mild cortical thickening in the left axilla. No apparent complications. Electronically Signed   By: Everlean Alstrom M.D.   On: 01/03/2018 14:08    ASSESSMENT: DCIS of the left breast.  PLAN:  1. DCIS of the left breast: Patient has lumpectomy scheduled for January 12, 2018.  Because there is no invasive component, she likely will not require adjuvant chemotherapy.  She is having a lumpectomy, therefore will require adjuvant XRT and a referral has been made to radiation oncology.  At the conclusion of her XRT, she will benefit from tamoxifen for total 5 years.  Return to clinic on February 09, 2018 for further evaluation, discussion of her final pathology results, and consultation with radiation oncology as above.  I spent a total of 60 minutes face-to-face with the patient of which greater than 50% of the visit was spent in counseling and coordination of care as detailed above.   Patient expressed understanding and was in agreement with this plan. She also understands that She can call clinic at any time with any questions, concerns, or complaints.   Cancer Staging No matching staging information was found for the patient.  Lloyd Huger, MD   01/11/2018 6:59 AM

## 2018-01-09 ENCOUNTER — Encounter: Payer: Self-pay | Admitting: *Deleted

## 2018-01-09 ENCOUNTER — Encounter: Payer: Self-pay | Admitting: Oncology

## 2018-01-09 ENCOUNTER — Encounter: Payer: Self-pay | Admitting: Surgery

## 2018-01-09 ENCOUNTER — Other Ambulatory Visit: Payer: Self-pay

## 2018-01-09 ENCOUNTER — Ambulatory Visit (INDEPENDENT_AMBULATORY_CARE_PROVIDER_SITE_OTHER): Payer: Medicare Other | Admitting: Surgery

## 2018-01-09 ENCOUNTER — Inpatient Hospital Stay: Payer: Medicare Other | Attending: Oncology | Admitting: Oncology

## 2018-01-09 VITALS — BP 147/64 | HR 60 | Temp 97.9°F | Resp 18 | Ht 60.0 in | Wt 158.0 lb

## 2018-01-09 DIAGNOSIS — Z7982 Long term (current) use of aspirin: Secondary | ICD-10-CM | POA: Insufficient documentation

## 2018-01-09 DIAGNOSIS — D0512 Intraductal carcinoma in situ of left breast: Secondary | ICD-10-CM

## 2018-01-09 DIAGNOSIS — Z90710 Acquired absence of both cervix and uterus: Secondary | ICD-10-CM | POA: Insufficient documentation

## 2018-01-09 DIAGNOSIS — Z79899 Other long term (current) drug therapy: Secondary | ICD-10-CM | POA: Insufficient documentation

## 2018-01-09 DIAGNOSIS — Z801 Family history of malignant neoplasm of trachea, bronchus and lung: Secondary | ICD-10-CM | POA: Insufficient documentation

## 2018-01-09 DIAGNOSIS — Z8041 Family history of malignant neoplasm of ovary: Secondary | ICD-10-CM | POA: Insufficient documentation

## 2018-01-09 NOTE — H&P (View-Only) (Signed)
Patient ID: Ellen [name], female   DOB: 08-08-1929, 84 y.o.   MRN: [id number]  HPI Ellen [name] is a 84 y.o. female a recently abnormal mammogram.  Please note that I have personally reviewed the mammogram and the ultrasound.  The images consists of 1 cm nodule in the left upper outer quadrant, had a small nodule in the left axilla with some cortical enhancement.  This prompted biopsy of both the breast and axilla.  The breast show evidence of DCIS and no evidence of metastatic disease within the axilla.  She now reports some mild intermittent pain and some bruising around the biopsy site.  No fevers no chills no weight loss.  She denies having any lumps in her breast.  No discharge.   Age menarche is 84 years old, the past in 70.  She had a partial hysterectomy.  No family history of breast cancer.  Personal history of hormone replacement.  She did have 5 pregnancies. Please note that the mammogram was a screening image study. She is Very functional and is able to perform more than 4 METS of activity without any shortness of her chest pain.  She is independent and drives on her own and walks without assistance.  She is competent and very lucid. Hb 14.7 PL 236, creat 1.2  HPI  Past Medical History:  Diagnosis Date  . Arthritis   . Cataract   . Chronic headaches   . CKD (chronic kidney disease)   . Diabetes mellitus type 2, uncomplicated ([place])   . Diverticulitis   . History of syncope   . Multinodular goiter   . Primary osteoarthritis of left knee   . Pulmonary hypertension ([place])     Past Surgical History:  Procedure Laterality Date  . ABDOMINAL HYSTERECTOMY    . BREAST BIOPSY Left 01/03/2018   left breast stereo/x clip/path pending  . CHOLECYSTECTOMY    . COLON SURGERY    . COLOSTOMY    . COLOSTOMY REVERSAL    . REPLACEMENT TOTAL KNEE      Family History  Problem Relation Age of Onset  . Lung cancer Mother   . Diabetes Father   . Stroke Father   . Hypertension Maternal  Grandmother   . Hypertension Maternal Uncle   . Hypertension Maternal Aunt   . Ovarian cancer Maternal Aunt   . Heart disease Daughter     Social History Social History   Tobacco Use  . Smoking status: Never Smoker  . Smokeless tobacco: Never Used  Substance Use Topics  . Alcohol use: Never    Frequency: Never  . Drug use: Never    Allergies  Allergen Reactions  . Codeine Nausea Only  . Morphine And Related Nausea Only  . Morpholine Salicylate Nausea Only    Heart races as well    Current Outpatient Medications  Medication Sig Dispense Refill  . acetaminophen (TYLENOL) 500 MG tablet Take by mouth.    Marland Kitchen amLODipine (NORVASC) 5 MG tablet Take 1 tablet by mouth daily.    Marland Kitchen aspirin 81 MG chewable tablet Chew by mouth.    . cloNIDine (CATAPRES) 0.1 MG tablet TAKE 1 TABLET BY MOUTH DAILY AS NEEDED FOR ELEVATED BLOOD PRESSURE GREATER THAN 180/100 90 tablet 1  . gabapentin (NEURONTIN) 100 MG capsule Start 1 capsule daily, increase by 1 cap every 1 week as tolerated, next add 1 in evening, up to max 3 pills in evening 90 capsule 1  . Garlic 10 MG CAPS Take by mouth.    Marland Kitchen  lisinopril-hydrochlorothiazide (PRINZIDE,ZESTORETIC) 20-12.5 MG tablet Take 1 tablet by mouth daily. 30 tablet 3  . Multiple Vitamins-Minerals (CENTRUM SILVER 50+WOMEN) TABS Take 1 tablet by mouth daily.    . pravastatin (PRAVACHOL) 20 MG tablet Take 1 tablet by mouth daily.  3  . vitamin B-12 (CYANOCOBALAMIN) 100 MCG tablet Take by mouth.     No current facility-administered medications for this visit.      Review of Systems Full ROS  was asked and was negative except for the information on the HPI  Physical Exam Blood pressure (!) 147/64, pulse 60, temperature 97.9 F (36.6 C), temperature source Temporal, resp. rate 18, height 5' (1.524 m), weight 158 lb (71.7 kg), SpO2 95 %. CONSTITUTIONAL: NAD. EYES: Pupils are equal, round, and reactive to light, Sclera are non-icteric. EARS, NOSE, MOUTH AND THROAT:  The oropharynx is clear. The oral mucosa is pink and moist. Hearing is intact to voice. LYMPH NODES:  Lymph nodes in the neck are normal. RESPIRATORY:  Lungs are clear. There is normal respiratory effort, with equal breath sounds bilaterally, and without pathologic use of accessory muscles. CARDIOVASCULAR: Heart is regular without murmurs, gallops, or rubs. BREAST: biopsy site left breast 2 o'clock. Some ecchymosis and tenderness. No discrete masses No Axillary LAD GI: The abdomen is  soft, nontender, and nondistended. There are no palpable masses. There is no hepatosplenomegaly. There are normal bowel sounds in all quadrants. GU: Rectal deferred.   MUSCULOSKELETAL: Normal muscle strength and tone. No cyanosis or edema.   SKIN: Turgor is good and there are no pathologic skin lesions or ulcers. NEUROLOGIC: Motor and sensation is grossly normal. Cranial nerves are grossly intact. PSYCH:  Oriented to person, place and time. Affect is normal.  Data Reviewed  I have personally reviewed the patient's imaging, laboratory findings and medical records.    Assessment/Plan 84 year old very functional female with a newly diagnosed Left DCIS.  Had a lengthy discussion with the patient and the family about the natural history of breast cancer.  Multimodality therapy to include chemotherapy, hormonal therapy as well as radiation therapy.  Multiple surgical options to include lumpectomy plus radiation therapy versus mastectomy.  She is very interested in breast conservation surgery I do think that she is a good candidate for lumpectomy.  Given the fact that she already had a node biopsy and that the DCIS is not a high risk I would not be performing a sentinel lymph node biopsy. Discussed with the patient in detail.  Risk benefit and possible complications including but not limited to: Bleeding, infection, breast deformity, positive margins.  She understands and wishes to proceed We will make sure that we make an  appointment with medical oncology as well   Time spent with the patient was 60 minutes, with more than 50% of the time spent in face-to-face education, counseling and care coordination.     Caroleen Hamman, MD FACS General Surgeon 01/09/2018, 3:21 PM

## 2018-01-09 NOTE — Patient Instructions (Signed)
Please call our office if you have questions or concerns.    We have spoken today about removing a lump in your breast. This will be done  by Dr. Caroleen Hamman at [facility].  You will most likely be able to leave the hospital several hours after your surgery. Rarely, a patient needs to stay over night but this is a possibility.  Plan to tenatively be off work for 1-2 weeks following the surgery and may return with approximately 4 more weeks of a lifting restriction, no greater than 15 lbs.    Lumpectomy A lumpectomy is a form of "breast conserving" or "breast preservation" surgery. It may also be referred to as a partial mastectomy. During a lumpectomy, the portion of the breast that contains the cancerous tumor or breast mass (the lump) is removed. Some normal tissue around the lump may also be removed to make sure all of the tumor has been removed.  LET [facility] CARE PROVIDER KNOW ABOUT:  Any allergies you have.  All medicines you are taking, including vitamins, herbs, eye drops, creams, and over-the-counter medicines.  Previous problems you or members of your family have had with the use of anesthetics.  Any blood disorders you have.  Previous surgeries you have had.  Medical conditions you have. RISKS AND COMPLICATIONS Generally, this is a safe procedure. However, problems can occur and include:  Bleeding.  Infection.  Pain.  Temporary swelling.  Change in the shape of the breast, particularly if a large portion is removed. BEFORE THE PROCEDURE  Ask your health care provider about changing or stopping your regular medicines. This is especially important if you are taking diabetes medicines or blood thinners.  Do not eat or drink anything after midnight on the night before the procedure or as directed by your health care provider. Ask your health care provider if you can take a sip of water with any approved medicines.  On the day of surgery, your health care provider will  use a mammogram or ultrasound to locate and mark the tumor in your breast. These markings on your breast will show where the cut (incision) will be made. PROCEDURE   An IV tube will be put into one of your veins.  You may be given medicine to help you relax before the surgery (sedative). You will be given one of the following:  A medicine that numbs the area (local anesthetic).  A medicine that makes you fall asleep (general anesthetic).  Your health care provider will use a kind of electric scalpel that uses heat to minimize bleeding (electrocautery knife).  A curved incision (like a smile or frown) that follows the natural curve of your breast is made, to allow for minimal scarring and better healing.  The tumor will be removed with some of the surrounding tissue. This will be sent to the lab for analysis. Your health care provider may also remove your lymph nodes at this time if needed.  Sometimes, but not always, a rubber tube called a drain will be surgically inserted into your breast area or armpit to collect excess fluid that may accumulate in the space where the tumor was. This drain is connected to a plastic bulb on the outside of your body. This drain creates suction to help remove the fluid.  The incisions will be closed with stitches (sutures).  A bandage may be placed over the incisions. AFTER THE PROCEDURE  You will be taken to the recovery area.  You will be given  medicine for pain.  A small rubber drain may be placed in the breast for 2-3 days to prevent a collection of blood (hematoma) from developing in the breast. You will be given instructions on caring for the drain before you go home.  A pressure bandage (dressing) will be applied for 1-2 days to prevent bleeding. Ask your health care provider how to care for your bandage at home.   This information is not intended to replace advice given to you by your health care provider. Make sure you discuss any questions  you have with your health care provider.   Document Released: 03/01/2006 Document Revised: 02/08/2014 Document Reviewed: 06/23/2012 Elsevier Interactive Patient Education [employer].

## 2018-01-09 NOTE — Progress Notes (Signed)
Talked to patient's daughter [name] per patient's request.  Scheduled patient to see Dr. Grayland Ormond on Monday at 3:30 and Dr. Dahlia Byes at 2:00.  The patient lives alone here in [place] and her daughter will be driving in to take her to both appointments.  [name] is to call with any questions or needs.

## 2018-01-09 NOTE — Progress Notes (Signed)
Patient ID: Ellen [name], female   DOB: April 23, 1929, 84 y.o.   MRN: [id number]  HPI Ellen [name] is a 84 y.o. female a recently abnormal mammogram.  Please note that I have personally reviewed the mammogram and the ultrasound.  The images consists of 1 cm nodule in the left upper outer quadrant, had a small nodule in the left axilla with some cortical enhancement.  This prompted biopsy of both the breast and axilla.  The breast show evidence of DCIS and no evidence of metastatic disease within the axilla.  She now reports some mild intermittent pain and some bruising around the biopsy site.  No fevers no chills no weight loss.  She denies having any lumps in her breast.  No discharge.   Age menarche is 84 years old, the past in 58.  She had a partial hysterectomy.  No family history of breast cancer.  Personal history of hormone replacement.  She did have 5 pregnancies. Please note that the mammogram was a screening image study. She is Very functional and is able to perform more than 4 METS of activity without any shortness of her chest pain.  She is independent and drives on her own and walks without assistance.  She is competent and very lucid. Hb 14.7 PL 236, creat 1.2  HPI  Past Medical History:  Diagnosis Date  . Arthritis   . Cataract   . Chronic headaches   . CKD (chronic kidney disease)   . Diabetes mellitus type 2, uncomplicated ([place])   . Diverticulitis   . History of syncope   . Multinodular goiter   . Primary osteoarthritis of left knee   . Pulmonary hypertension ([place])     Past Surgical History:  Procedure Laterality Date  . ABDOMINAL HYSTERECTOMY    . BREAST BIOPSY Left 01/03/2018   left breast stereo/x clip/path pending  . CHOLECYSTECTOMY    . COLON SURGERY    . COLOSTOMY    . COLOSTOMY REVERSAL    . REPLACEMENT TOTAL KNEE      Family History  Problem Relation Age of Onset  . Lung cancer Mother   . Diabetes Father   . Stroke Father   . Hypertension Maternal  Grandmother   . Hypertension Maternal Uncle   . Hypertension Maternal Aunt   . Ovarian cancer Maternal Aunt   . Heart disease Daughter     Social History Social History   Tobacco Use  . Smoking status: Never Smoker  . Smokeless tobacco: Never Used  Substance Use Topics  . Alcohol use: Never    Frequency: Never  . Drug use: Never    Allergies  Allergen Reactions  . Codeine Nausea Only  . Morphine And Related Nausea Only  . Morpholine Salicylate Nausea Only    Heart races as well    Current Outpatient Medications  Medication Sig Dispense Refill  . acetaminophen (TYLENOL) 500 MG tablet Take by mouth.    Marland Kitchen amLODipine (NORVASC) 5 MG tablet Take 1 tablet by mouth daily.    Marland Kitchen aspirin 81 MG chewable tablet Chew by mouth.    . cloNIDine (CATAPRES) 0.1 MG tablet TAKE 1 TABLET BY MOUTH DAILY AS NEEDED FOR ELEVATED BLOOD PRESSURE GREATER THAN 180/100 90 tablet 1  . gabapentin (NEURONTIN) 100 MG capsule Start 1 capsule daily, increase by 1 cap every 1 week as tolerated, next add 1 in evening, up to max 3 pills in evening 90 capsule 1  . Garlic 10 MG CAPS Take by mouth.    Marland Kitchen  lisinopril-hydrochlorothiazide (PRINZIDE,ZESTORETIC) 20-12.5 MG tablet Take 1 tablet by mouth daily. 30 tablet 3  . Multiple Vitamins-Minerals (CENTRUM SILVER 50+WOMEN) TABS Take 1 tablet by mouth daily.    . pravastatin (PRAVACHOL) 20 MG tablet Take 1 tablet by mouth daily.  3  . vitamin B-12 (CYANOCOBALAMIN) 100 MCG tablet Take by mouth.     No current facility-administered medications for this visit.      Review of Systems Full ROS  was asked and was negative except for the information on the HPI  Physical Exam Blood pressure (!) 147/64, pulse 60, temperature 97.9 F (36.6 C), temperature source Temporal, resp. rate 18, height 5' (1.524 m), weight 158 lb (71.7 kg), SpO2 95 %. CONSTITUTIONAL: NAD. EYES: Pupils are equal, round, and reactive to light, Sclera are non-icteric. EARS, NOSE, MOUTH AND THROAT:  The oropharynx is clear. The oral mucosa is pink and moist. Hearing is intact to voice. LYMPH NODES:  Lymph nodes in the neck are normal. RESPIRATORY:  Lungs are clear. There is normal respiratory effort, with equal breath sounds bilaterally, and without pathologic use of accessory muscles. CARDIOVASCULAR: Heart is regular without murmurs, gallops, or rubs. BREAST: biopsy site left breast 2 o'clock. Some ecchymosis and tenderness. No discrete masses No Axillary LAD GI: The abdomen is  soft, nontender, and nondistended. There are no palpable masses. There is no hepatosplenomegaly. There are normal bowel sounds in all quadrants. GU: Rectal deferred.   MUSCULOSKELETAL: Normal muscle strength and tone. No cyanosis or edema.   SKIN: Turgor is good and there are no pathologic skin lesions or ulcers. NEUROLOGIC: Motor and sensation is grossly normal. Cranial nerves are grossly intact. PSYCH:  Oriented to person, place and time. Affect is normal.  Data Reviewed  I have personally reviewed the patient's imaging, laboratory findings and medical records.    Assessment/Plan 84 year old very functional female with a newly diagnosed Left DCIS.  Had a lengthy discussion with the patient and the family about the natural history of breast cancer.  Multimodality therapy to include chemotherapy, hormonal therapy as well as radiation therapy.  Multiple surgical options to include lumpectomy plus radiation therapy versus mastectomy.  She is very interested in breast conservation surgery I do think that she is a good candidate for lumpectomy.  Given the fact that she already had a node biopsy and that the DCIS is not a high risk I would not be performing a sentinel lymph node biopsy. Discussed with the patient in detail.  Risk benefit and possible complications including but not limited to: Bleeding, infection, breast deformity, positive margins.  She understands and wishes to proceed We will make sure that we make an  appointment with medical oncology as well   Time spent with the patient was 60 minutes, with more than 50% of the time spent in face-to-face education, counseling and care coordination.     Caroleen Hamman, MD FACS General Surgeon 01/09/2018, 3:21 PM

## 2018-01-10 ENCOUNTER — Other Ambulatory Visit: Payer: Self-pay | Admitting: Surgery

## 2018-01-10 ENCOUNTER — Telehealth: Payer: Self-pay

## 2018-01-10 ENCOUNTER — Other Ambulatory Visit: Payer: Self-pay

## 2018-01-10 DIAGNOSIS — D0512 Intraductal carcinoma in situ of left breast: Secondary | ICD-10-CM

## 2018-01-10 NOTE — Telephone Encounter (Signed)
The patient will pre admit at the hospital on 01/11/18 at 12:30 pm. She will report to the [facility] the morning of her surgery on 01/12/18 at 9:15 am. The patient is aware of date, time, and instructions.

## 2018-01-10 NOTE — Progress Notes (Signed)
  Oncology Nurse Navigator Documentation  Navigator Location: [facility]-Med Onc (01/10/18 0800) Referral date to RadOnc/MedOnc: 01/09/18 (01/10/18 0800) )Navigator Encounter Type: Initial MedOnc (01/10/18 0800)   Abnormal Finding Date: 12/23/17 (01/10/18 0800) Confirmed Diagnosis Date: 01/04/18 (01/10/18 0800) Surgery Date: 01/12/18 (01/10/18 0800)             Patient Visit Type: Initial (01/10/18 0800) Treatment Phase: Pre-Tx/Tx Discussion (01/10/18 0800) Barriers/Navigation Needs: Education (01/10/18 0800) Education: Newly Diagnosed Cancer Education (01/10/18 0800) Interventions: Education (01/10/18 0800)     Education Method: Verbal;Written (01/10/18 0800)  Support Groups/Services: Breast Support Group (01/10/18 0800)             Time Spent with Patient: 90 (01/10/18 0800)   Met patient and her daughter yesterday during her medical oncology consult with Dr. Grayland Ormond.  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  She drives herself back and forth to [place] to see family.  She is newly diagnosed with DCIS.  She is scheduled for surgery on the 12th to follow with radiation therapy and probably antihormonal therapy.  She is to call if she or her daughter have any questions or needs.

## 2018-01-11 ENCOUNTER — Ambulatory Visit (INDEPENDENT_AMBULATORY_CARE_PROVIDER_SITE_OTHER): Payer: Medicare Other | Admitting: Family Medicine

## 2018-01-11 ENCOUNTER — Encounter
Admission: RE | Admit: 2018-01-11 | Discharge: 2018-01-11 | Disposition: A | Discharge status: Home / Self Care | Payer: Medicare Other | Source: Ambulatory Visit | Attending: Surgery | Admitting: Surgery

## 2018-01-11 ENCOUNTER — Other Ambulatory Visit: Payer: Self-pay

## 2018-01-11 ENCOUNTER — Encounter: Payer: Self-pay | Admitting: *Deleted

## 2018-01-11 ENCOUNTER — Encounter: Payer: Self-pay | Admitting: Family Medicine

## 2018-01-11 VITALS — BP 127/70 | HR 90 | Temp 98.1°F | Resp 16 | Ht 60.0 in | Wt 160.0 lb

## 2018-01-11 DIAGNOSIS — I10 Essential (primary) hypertension: Secondary | ICD-10-CM | POA: Diagnosis not present

## 2018-01-11 DIAGNOSIS — Z01812 Encounter for preprocedural laboratory examination: Secondary | ICD-10-CM | POA: Insufficient documentation

## 2018-01-11 DIAGNOSIS — E1121 Type 2 diabetes mellitus with diabetic nephropathy: Secondary | ICD-10-CM | POA: Diagnosis not present

## 2018-01-11 DIAGNOSIS — C50412 Malignant neoplasm of upper-outer quadrant of left female breast: Secondary | ICD-10-CM | POA: Diagnosis not present

## 2018-01-11 DIAGNOSIS — I272 Pulmonary hypertension, unspecified: Secondary | ICD-10-CM | POA: Diagnosis not present

## 2018-01-11 DIAGNOSIS — D0512 Intraductal carcinoma in situ of left breast: Secondary | ICD-10-CM | POA: Diagnosis not present

## 2018-01-11 DIAGNOSIS — N189 Chronic kidney disease, unspecified: Secondary | ICD-10-CM | POA: Diagnosis not present

## 2018-01-11 DIAGNOSIS — Z79899 Other long term (current) drug therapy: Secondary | ICD-10-CM | POA: Diagnosis not present

## 2018-01-11 DIAGNOSIS — E1122 Type 2 diabetes mellitus with diabetic chronic kidney disease: Secondary | ICD-10-CM | POA: Diagnosis not present

## 2018-01-11 DIAGNOSIS — Z885 Allergy status to narcotic agent status: Secondary | ICD-10-CM | POA: Diagnosis not present

## 2018-01-11 DIAGNOSIS — Z7982 Long term (current) use of aspirin: Secondary | ICD-10-CM | POA: Diagnosis not present

## 2018-01-11 DIAGNOSIS — I129 Hypertensive chronic kidney disease with stage 1 through stage 4 chronic kidney disease, or unspecified chronic kidney disease: Secondary | ICD-10-CM | POA: Diagnosis not present

## 2018-01-11 HISTORY — DX: Type 2 diabetes mellitus without complications: E11.9

## 2018-01-11 HISTORY — DX: Malignant (primary) neoplasm, unspecified: C80.1

## 2018-01-11 LAB — CBC
HCT: 41.2 % (ref 36.0–46.0)
Hemoglobin: 12.9 g/dL (ref 12.0–15.0)
MCH: 26.7 pg (ref 26.0–34.0)
MCHC: 31.3 g/dL (ref 30.0–36.0)
MCV: 85.1 fL (ref 80.0–100.0)
NRBC: 0 % (ref 0.0–0.2)
Platelets: 212 10*3/uL (ref 150–400)
RBC: 4.84 MIL/uL (ref 3.87–5.11)
RDW: 16 % — ABNORMAL HIGH (ref 11.5–15.5)
WBC: 7.7 10*3/uL (ref 4.0–10.5)

## 2018-01-11 LAB — BASIC METABOLIC PANEL
Anion gap: 12 (ref 5–15)
BUN: 19 mg/dL (ref 8–23)
CO2: 28 mmol/L (ref 22–32)
Calcium: 10.6 mg/dL — ABNORMAL HIGH (ref 8.9–10.3)
Chloride: 99 mmol/L (ref 98–111)
Creatinine, Ser: 1.39 mg/dL — ABNORMAL HIGH (ref 0.44–1.00)
GFR calc Af Amer: 40 mL/min — ABNORMAL LOW (ref >60)
GFR calc non Af Amer: 34 mL/min — ABNORMAL LOW (ref >60)
Glucose, Bld: 111 mg/dL — ABNORMAL HIGH (ref 70–99)
Potassium: 3.6 mmol/L (ref 3.5–5.1)
SODIUM: 139 mmol/L (ref 135–145)

## 2018-01-11 LAB — POCT GLYCOSYLATED HEMOGLOBIN (HGB A1C): Hemoglobin A1C: 6.3 % — AB (ref 4.0–5.6)

## 2018-01-11 NOTE — Assessment & Plan Note (Signed)
Well-controlled DM with A1c 6.3 today, well within goal for 88 yr patient, previously stable from 6.1 to 6.3 in past year+ Remains off of medication Complications - CKD-III, other including hyperlipidemia - increases risk of future cardiovascular complications  OFF Metformin for years  Plan:  1. Remain off therapy - not indicated - now still diet controlled 2. Encourage improved lifestyle - low carb, low sugar diet, reduce portion size, continue improving regular exercise 3. May Check CBG  4. Continue ASA, ACEi, Statin 5. DM Foot exam done today / Advised to schedule DM ophtho exam, send record - handout given w/ list of local eye doctors 6. Follow-up 6 months A1c

## 2018-01-11 NOTE — Pre-Procedure Instructions (Signed)
Copy and pasted from ED note:  EKG  ED ECG REPORT I, Loney Hering, the attending physician, personally viewed and interpreted this ECG.   Date: 09/18/2017  EKG Time: 131  Rate: 73  Rhythm: normal sinus rhythm  Axis: normal  Intervals:none  ST&T Change: none

## 2018-01-11 NOTE — Patient Instructions (Signed)
Your procedure is scheduled on: Thursday, January 12, 2018 Report to [facility] at time indicated.  REMEMBER: Instructions that are not followed completely may result in serious medical risk, up to and including death; or upon the discretion of your surgeon and anesthesiologist your surgery may need to be rescheduled.  Do not eat food after midnight the night before surgery.  No gum chewing, lozengers or hard candies.  You may however, drink CLEAR liquids up to 2 hours before you are scheduled to arrive for your surgery. Do not drink anything within 2 hours of the start of your surgery.  Clear liquids include: - water  - apple juice without pulp - gatorade - black coffee or tea (Do NOT add milk or creamers to the coffee or tea) Do NOT drink anything that is not on this list.  No Alcohol for 24 hours before or after surgery.  No Smoking including e-cigarettes for 24 hours prior to surgery.  No chewable tobacco products for at least 6 hours prior to surgery.  No nicotine patches on the day of surgery.  On the morning of surgery brush your teeth with toothpaste and water, you may rinse your mouth with mouthwash if you wish. Do not swallow any toothpaste or mouthwash.  Notify your doctor if there is any change in your medical condition (cold, fever, infection).  Do not wear jewelry, make-up, hairpins, clips or nail polish.  Do not wear lotions, powders, or perfumes.   Do not shave 48 hours prior to surgery.   Contacts and dentures may not be worn into surgery.  Do not bring valuables to the hospital, including drivers license, insurance or credit cards.  [facility] is not responsible for any belongings or valuables.   TAKE THESE MEDICATIONS THE MORNING OF SURGERY:  1.  Amlodipine 2.  Gabapentin  Use CHG Soap as directed on instruction sheet.  NOW!  Stop ASPIRIN and Anti-inflammatories (NSAIDS) such as Advil, Aleve, Ibuprofen, Motrin, Naproxen, Naprosyn and  Aspirin based products such as Excedrin, Goodys Powder, BC Powder. (May take Tylenol or Acetaminophen if needed.)  Now!  Stop ANY OVER THE COUNTER supplements until after surgery. (May continue multivitamin.)  Wear comfortable clothing (specific to your surgery type) to the hospital.  Plan for stool softeners for home use.  If you are being discharged the day of surgery, you will not be allowed to drive home. You will need a responsible adult to drive you home and stay with you that night.   If you are taking public transportation, you will need to have a responsible adult with you. Please confirm with your physician that it is acceptable to use public transportation.   Please call [phone] if you have any questions about these instructions.

## 2018-01-11 NOTE — Progress Notes (Signed)
Please note abnormal labs done 01-11-18 at [facility].  Note to Dr. Dahlia Byes.   Surgery scheduled for 01-12-18.

## 2018-01-11 NOTE — Patient Instructions (Addendum)
Thank you for coming to the office today.  Keep me posted if there is anything you need. I understand the recent breast cancer diagnosis.  Your provider would like to you have your annual eye exam. Please contact your current eye doctor or here are some good options for you to contact.   [facility]   Address: [address] [place], [place] [address] Phone: [phone]  Website: [email or web address] [address] [place], [place] [address] Phone: [phone] [email or web address]  [facility]  Address: [place], [place], [place] [address] Phone: [phone]   [facility] [address] [place], [place] [address] Phone: [phone]  [facility] Address: [place], [place], [place] [address]  Phone:  [phone]  ----------------------   Ray Church, MD   Neurology   [place] [address]      Phone: [phone]   Fax: [phone]    Please schedule a Follow-up Appointment to: Return in about 6 months (around 07/13/2018) for 6 month follow-up DM A1c, Neurology follow-up.  If you have any other questions or concerns, please feel free to call the office or send a message through [place]. You may also schedule an earlier appointment if necessary.  Additionally, you may be receiving a survey about your experience at our office within a few days to 1 week by e-mail or mail. We value your feedback.  [name], DO [place]

## 2018-01-11 NOTE — Assessment & Plan Note (Signed)
Followed by current team at [facility] and Gen Surgery Dr Dahlia Byes S/p biopsy Next due for L lumpectomy followed by additional treatments

## 2018-01-11 NOTE — Progress Notes (Signed)
Subjective:    Patient ID: Ellen [name], female    DOB: [birth year], 84 y.o.   MRN: [id number]  Ellen [name] is a 84 y.o. female presenting on 01/11/2018 for Hypertension and Diabetes   HPI   CHRONIC DM, Type 2: Known long history of T2DM, previously discussed background in 11/2017. She used to be on Metformin >4 years ago but was taken off of this. Her last A1c on chart in [facility]where is 6.1 to 6.3 range. CBGs: Not checking currently Meds: None - remains off Metformin for years Currently on ACEi Lifestyle: - Diet (balanced diet, low carb diet)  - Exercise (limited, but does walking) Due for DM Eye Exam - no longer has eye doctor. Also due for DM foot exam today - does not have podiatry, has some abnormal position of 2nd toe, she uses wide shoes for this. Denies hypoglycemia, polyuria, visual changes, numbness or tingling.  CHRONIC HTN: Reports long history of HTN, worse elevations with head pain headache. She has home monitor reading, if no pain, BP is controlled. - Today no new concerns - Her upcoming apt with Neurology is in January 2020 Current Meds - Amlodipine 5mg  daily, Lisinopril-HCTZ 20-12.5mg  daily - has Clonidine 0.1mg  PRN only Reports good compliance, took meds today. Tolerating well, w/o complaints. Denies CP, dyspnea, edema, dizziness / lightheadedness  Follow-up LEFT DCIS (Ductal Carcinoma In Situ) Newly diagnosed on recent screening mammogram and then diagnostic mammo / [place]. She has had biopsy and confirmed. Already seen Surgery Dr Dahlia Byes and Oncology Dr Grayland Ormond. She is scheduled for Lumpectomy of L breast tomorrow 01/12/18. Then will proceed with other therapy as directed.   Depression screen [facility] 2/9 01/11/2018 12/16/2017 11/10/2017  Decreased Interest 0 0 0  Down, Depressed, Hopeless 0 0 0  PHQ - 2 Score 0 0 0    Social History   Tobacco Use  . Smoking status: Never Smoker  . Smokeless tobacco: Never Used  Substance Use Topics  . Alcohol use:  Never    Frequency: Never  . Drug use: Never    Review of Systems Per HPI unless specifically indicated above     Objective:    BP 127/70   Pulse 90   Temp 98.1 F (36.7 C) (Oral)   Resp 16   Ht 5' (1.524 m)   Wt 160 lb (72.6 kg)   BMI 31.25 kg/m   Wt Readings from Last 3 Encounters:  01/11/18 160 lb (72.6 kg)  01/09/18 159 lb 4.8 oz (72.3 kg)  01/09/18 158 lb (71.7 kg)    Physical Exam  Constitutional: She is oriented to person, place, and time. She appears well-developed and well-nourished. No distress.  Well-appearing, comfortable, cooperative  HENT:  Head: Normocephalic and atraumatic.  Mouth/Throat: Oropharynx is clear and moist.  Eyes: Conjunctivae are normal. Right eye exhibits no discharge. Left eye exhibits no discharge.  Cardiovascular: Normal rate.  Pulmonary/Chest: Effort normal.  Musculoskeletal: She exhibits no edema.  Neurological: She is alert and oriented to person, place, and time.  Skin: Skin is warm and dry. No rash noted. She is not diaphoretic. No erythema.  Psychiatric: She has a normal mood and affect. Her behavior is normal.  Well groomed, good eye contact, normal speech and thoughts  Nursing note and vitals reviewed.    Diabetic Foot Exam - Simple   Simple Foot Form Diabetic Foot exam was performed with the following findings:  Yes 01/11/2018 11:35 AM  Visual Inspection See comments:  Yes Sensation Testing Intact to  touch and monofilament testing bilaterally:  Yes Pulse Check Posterior Tibialis and Dorsalis pulse intact bilaterally:  Yes Comments Bilateral feet has some overlap with 2nd toe extending near and slightly over edge of great toe. Some overgrown toenails. Mild callus formation.     Results for orders placed or performed in visit on 01/11/18  POCT HgB A1C  Result Value Ref Range   Hemoglobin A1C 6.3 (A) 4.0 - 5.6 %      Assessment & Plan:   Problem List Items Addressed This Visit    Ductal carcinoma in situ (DCIS) of  left breast    Followed by current team at [facility] and Gen Surgery Dr Dahlia Byes S/p biopsy Next due for L lumpectomy followed by additional treatments      Essential hypertension    Well-controlled HTN - Home BP readings normal  Complicated by headache syndrome    Plan:  1. Continue current BP regimen - Amlodipine 5mg  daily, Lisinopril-HCTZ 20-12.5mg  daily - Has Clonidine 0.1mg  PRN HTN 2. Encourage improved lifestyle - low sodium diet, regular exercise 3. Continue monitor BP outside office, bring readings to next visit, if persistently >140/90 or new symptoms notify office sooner 4. Follow-up within 6 months       Type 2 diabetes mellitus with diabetic nephropathy, without long-term current use of insulin ([place]) - Primary    Well-controlled DM with A1c 6.3 today, well within goal for 88 yr patient, previously stable from 6.1 to 6.3 in past year+ Remains off of medication Complications - CKD-III, other including hyperlipidemia - increases risk of future cardiovascular complications  OFF Metformin for years  Plan:  1. Remain off therapy - not indicated - now still diet controlled 2. Encourage improved lifestyle - low carb, low sugar diet, reduce portion size, continue improving regular exercise 3. May Check CBG  4. Continue ASA, ACEi, Statin 5. DM Foot exam done today / Advised to schedule DM ophtho exam, send record - handout given w/ list of local eye doctors 6. Follow-up 6 months A1c       Relevant Orders   POCT HgB A1C (Completed)      #Follow-up w/ [facility] Neurology as scheduled 02/15/18 - for headaches  No orders of the defined types were placed in this encounter.   Follow up plan: Return in about 6 months (around 07/13/2018) for 6 month follow-up DM A1c, Neurology follow-up.  [name], DO [facility] 01/11/2018, 11:22 AM

## 2018-01-11 NOTE — Assessment & Plan Note (Addendum)
Well-controlled HTN - Home BP readings normal  Complicated by headache syndrome    Plan:  1. Continue current BP regimen - Amlodipine 5mg  daily, Lisinopril-HCTZ 20-12.5mg  daily - Has Clonidine 0.1mg  PRN HTN 2. Encourage improved lifestyle - low sodium diet, regular exercise 3. Continue monitor BP outside office, bring readings to next visit, if persistently >140/90 or new symptoms notify office sooner 4. Follow-up within 6 months

## 2018-01-12 ENCOUNTER — Other Ambulatory Visit: Payer: Self-pay

## 2018-01-12 ENCOUNTER — Ambulatory Visit
Admission: RE | Admit: 2018-01-12 | Discharge: 2018-01-12 | Disposition: A | Discharge status: Home / Self Care | Payer: Medicare Other | Attending: Surgery | Admitting: Surgery

## 2018-01-12 ENCOUNTER — Ambulatory Visit: Payer: Medicare Other | Admitting: Certified Registered"

## 2018-01-12 ENCOUNTER — Encounter
Admission: RE | Disposition: A | Discharge status: Home / Self Care | Payer: Self-pay | Source: Home / Self Care | Attending: Surgery

## 2018-01-12 ENCOUNTER — Ambulatory Visit
Admission: RE | Admit: 2018-01-12 | Discharge: 2018-01-12 | Disposition: A | Discharge status: Home / Self Care | Payer: Medicare Other | Source: Ambulatory Visit | Attending: Surgery | Admitting: Surgery

## 2018-01-12 DIAGNOSIS — Z7982 Long term (current) use of aspirin: Secondary | ICD-10-CM | POA: Diagnosis not present

## 2018-01-12 DIAGNOSIS — D0512 Intraductal carcinoma in situ of left breast: Secondary | ICD-10-CM

## 2018-01-12 DIAGNOSIS — E1122 Type 2 diabetes mellitus with diabetic chronic kidney disease: Secondary | ICD-10-CM | POA: Insufficient documentation

## 2018-01-12 DIAGNOSIS — C50412 Malignant neoplasm of upper-outer quadrant of left female breast: Secondary | ICD-10-CM | POA: Diagnosis not present

## 2018-01-12 DIAGNOSIS — N6321 Unspecified lump in the left breast, upper outer quadrant: Secondary | ICD-10-CM | POA: Diagnosis not present

## 2018-01-12 DIAGNOSIS — Z79899 Other long term (current) drug therapy: Secondary | ICD-10-CM | POA: Diagnosis not present

## 2018-01-12 DIAGNOSIS — I272 Pulmonary hypertension, unspecified: Secondary | ICD-10-CM | POA: Diagnosis not present

## 2018-01-12 DIAGNOSIS — I129 Hypertensive chronic kidney disease with stage 1 through stage 4 chronic kidney disease, or unspecified chronic kidney disease: Secondary | ICD-10-CM | POA: Insufficient documentation

## 2018-01-12 DIAGNOSIS — N189 Chronic kidney disease, unspecified: Secondary | ICD-10-CM | POA: Insufficient documentation

## 2018-01-12 DIAGNOSIS — R079 Chest pain, unspecified: Secondary | ICD-10-CM | POA: Diagnosis not present

## 2018-01-12 DIAGNOSIS — C50912 Malignant neoplasm of unspecified site of left female breast: Secondary | ICD-10-CM | POA: Diagnosis not present

## 2018-01-12 DIAGNOSIS — Z885 Allergy status to narcotic agent status: Secondary | ICD-10-CM | POA: Diagnosis not present

## 2018-01-12 HISTORY — PX: BREAST LUMPECTOMY WITH NEEDLE LOCALIZATION: SHX5759

## 2018-01-12 HISTORY — PX: BREAST LUMPECTOMY: SHX2

## 2018-01-12 LAB — GLUCOSE, CAPILLARY
Glucose-Capillary: 83 mg/dL (ref 70–99)
Glucose-Capillary: 95 mg/dL (ref 70–99)

## 2018-01-12 SURGERY — BREAST LUMPECTOMY WITH NEEDLE LOCALIZATION
Anesthesia: General | Site: Breast | Laterality: Left

## 2018-01-12 MED ORDER — ONDANSETRON HCL 4 MG/2ML IJ SOLN
INTRAMUSCULAR | Status: DC | PRN
  Administered 2018-01-12: 4 mg via INTRAVENOUS

## 2018-01-12 MED ORDER — DEXAMETHASONE SODIUM PHOSPHATE 10 MG/ML IJ SOLN
INTRAMUSCULAR | Status: AC
  Filled 2018-01-12: qty 1

## 2018-01-12 MED ORDER — LIDOCAINE HCL (PF) 2 % IJ SOLN
INTRAMUSCULAR | Status: AC
  Filled 2018-01-12: qty 10

## 2018-01-12 MED ORDER — PROPOFOL 10 MG/ML IV BOLUS
INTRAVENOUS | Status: DC | PRN
  Administered 2018-01-12: 20 mg via INTRAVENOUS
  Administered 2018-01-12: 90 mg via INTRAVENOUS

## 2018-01-12 MED ORDER — CEFAZOLIN SODIUM-DEXTROSE 2-4 GM/100ML-% IV SOLN
INTRAVENOUS | Status: AC
  Filled 2018-01-12: qty 100

## 2018-01-12 MED ORDER — LIDOCAINE HCL (CARDIAC) PF 100 MG/5ML IV SOSY
PREFILLED_SYRINGE | INTRAVENOUS | Status: DC | PRN
  Administered 2018-01-12: 60 mg via INTRAVENOUS

## 2018-01-12 MED ORDER — ACETAMINOPHEN 10 MG/ML IV SOLN
INTRAVENOUS | Status: AC
  Filled 2018-01-12: qty 100

## 2018-01-12 MED ORDER — CEFAZOLIN SODIUM-DEXTROSE 2-4 GM/100ML-% IV SOLN
2.0000 g | INTRAVENOUS | Status: AC
  Administered 2018-01-12: 2 g via INTRAVENOUS

## 2018-01-12 MED ORDER — ONDANSETRON HCL 4 MG/2ML IJ SOLN
INTRAMUSCULAR | Status: AC
  Filled 2018-01-12: qty 2

## 2018-01-12 MED ORDER — BUPIVACAINE-EPINEPHRINE 0.25% -1:200000 IJ SOLN
INTRAMUSCULAR | Status: DC | PRN
  Administered 2018-01-12: 20 mL

## 2018-01-12 MED ORDER — PHENYLEPHRINE HCL 10 MG/ML IJ SOLN
INTRAMUSCULAR | Status: DC | PRN
  Administered 2018-01-12 (×3): 100 ug via INTRAVENOUS
  Administered 2018-01-12 (×2): 50 ug via INTRAVENOUS

## 2018-01-12 MED ORDER — HYDROCODONE-ACETAMINOPHEN 5-325 MG PO TABS: 1.0000 | ORAL_TABLET | Freq: Four times a day (QID) | ORAL | 0 refills | Status: DC | PRN

## 2018-01-12 MED ORDER — DEXAMETHASONE SODIUM PHOSPHATE 10 MG/ML IJ SOLN
INTRAMUSCULAR | Status: DC | PRN
  Administered 2018-01-12: 5 mg via INTRAVENOUS

## 2018-01-12 MED ORDER — FENTANYL CITRATE (PF) 100 MCG/2ML IJ SOLN
INTRAMUSCULAR | Status: AC
  Filled 2018-01-12: qty 2

## 2018-01-12 MED ORDER — ACETAMINOPHEN 10 MG/ML IV SOLN
INTRAVENOUS | Status: DC | PRN
  Administered 2018-01-12: 1000 mg via INTRAVENOUS

## 2018-01-12 MED ORDER — FENTANYL CITRATE (PF) 100 MCG/2ML IJ SOLN
25.0000 ug | INTRAMUSCULAR | Status: DC | PRN
  Administered 2018-01-12 (×3): 25 ug via INTRAVENOUS

## 2018-01-12 MED ORDER — CHLORHEXIDINE GLUCONATE CLOTH 2 % EX PADS: 6.0000 | MEDICATED_PAD | Freq: Once | CUTANEOUS | Status: DC

## 2018-01-12 MED ORDER — BUPIVACAINE-EPINEPHRINE (PF) 0.25% -1:200000 IJ SOLN
INTRAMUSCULAR | Status: AC
  Filled 2018-01-12: qty 30

## 2018-01-12 MED ORDER — PROPOFOL 10 MG/ML IV BOLUS
INTRAVENOUS | Status: AC
  Filled 2018-01-12: qty 20

## 2018-01-12 MED ORDER — HYDROCODONE-ACETAMINOPHEN 5-325 MG PO TABS
ORAL_TABLET | ORAL | Status: AC
  Filled 2018-01-12: qty 1

## 2018-01-12 MED ORDER — FENTANYL CITRATE (PF) 100 MCG/2ML IJ SOLN
INTRAMUSCULAR | Status: AC
  Administered 2018-01-12: 25 ug via INTRAVENOUS
  Filled 2018-01-12: qty 2

## 2018-01-12 MED ORDER — FAMOTIDINE 20 MG PO TABS
20.0000 mg | ORAL_TABLET | Freq: Once | ORAL | Status: AC
  Administered 2018-01-12: 20 mg via ORAL

## 2018-01-12 MED ORDER — ONDANSETRON HCL 4 MG/2ML IJ SOLN: 4.0000 mg | Freq: Once | INTRAMUSCULAR | Status: DC | PRN

## 2018-01-12 MED ORDER — SODIUM CHLORIDE 0.9 % IV SOLN
INTRAVENOUS | Status: DC
  Administered 2018-01-12 (×2): via INTRAVENOUS

## 2018-01-12 MED ORDER — HYDROCODONE-ACETAMINOPHEN 5-325 MG PO TABS
1.0000 | ORAL_TABLET | Freq: Once | ORAL | Status: AC
  Administered 2018-01-12: 1 via ORAL

## 2018-01-12 MED ORDER — FENTANYL CITRATE (PF) 100 MCG/2ML IJ SOLN
INTRAMUSCULAR | Status: DC | PRN
  Administered 2018-01-12 (×4): 25 ug via INTRAVENOUS

## 2018-01-12 MED ORDER — FAMOTIDINE 20 MG PO TABS
ORAL_TABLET | ORAL | Status: AC
  Administered 2018-01-12: 20 mg via ORAL
  Filled 2018-01-12: qty 1

## 2018-01-12 SURGICAL SUPPLY — 32 items
APPLIER CLIP 9.375 SM OPEN (CLIP)
BLADE SURG SZ10 CARB STEEL (BLADE) ×3 IMPLANT
CANISTER SUCT 1200ML W/VALVE (MISCELLANEOUS) ×3 IMPLANT
CHLORAPREP W/TINT 26ML (MISCELLANEOUS) ×3 IMPLANT
CLIP APPLIE 9.375 SM OPEN (CLIP) IMPLANT
COVER WAND RF STERILE (DRAPES) ×3 IMPLANT
DERMABOND ADVANCED (GAUZE/BANDAGES/DRESSINGS) ×2
DERMABOND ADVANCED .7 DNX12 (GAUZE/BANDAGES/DRESSINGS) ×1 IMPLANT
DRAPE CHEST BREAST 77X106 FENE (MISCELLANEOUS) ×3 IMPLANT
DRAPE INCISE IOBAN 66X60 STRL (DRAPES) ×3 IMPLANT
ELECT CAUTERY BLADE 6.4 (BLADE) ×3 IMPLANT
ELECT REM PT RETURN 9FT ADLT (ELECTROSURGICAL) ×3
ELECTRODE REM PT RTRN 9FT ADLT (ELECTROSURGICAL) ×1 IMPLANT
GLOVE BIO SURGEON STRL SZ7 (GLOVE) ×3 IMPLANT
GLOVE INDICATOR 7.5 STRL GRN (GLOVE) ×3 IMPLANT
GOWN STRL REUS W/ TWL LRG LVL3 (GOWN DISPOSABLE) ×2 IMPLANT
GOWN STRL REUS W/TWL LRG LVL3 (GOWN DISPOSABLE) ×4
KIT TURNOVER KIT A (KITS) ×3 IMPLANT
MARGIN MAP 10MM (MISCELLANEOUS) ×3 IMPLANT
NEEDLE HYPO 22GX1.5 SAFETY (NEEDLE) ×3 IMPLANT
PACK BASIN MINOR ARMC (MISCELLANEOUS) ×3 IMPLANT
SPONGE LAP 18X18 RF (DISPOSABLE) ×3 IMPLANT
SUT MNCRL 4-0 (SUTURE) ×2
SUT MNCRL 4-0 27XMFL (SUTURE) ×1
SUT SILK 2 0 SH (SUTURE) ×3 IMPLANT
SUT VIC AB 2-0 SH 27 (SUTURE) ×4
SUT VIC AB 2-0 SH 27XBRD (SUTURE) ×2 IMPLANT
SUT VIC AB 3-0 SH 27 (SUTURE) ×2
SUT VIC AB 3-0 SH 27X BRD (SUTURE) ×1 IMPLANT
SUTURE MNCRL 4-0 27XMF (SUTURE) ×1 IMPLANT
SYR 10ML LL (SYRINGE) ×3 IMPLANT
WATER STERILE IRR 1000ML POUR (IV SOLUTION) ×3 IMPLANT

## 2018-01-12 NOTE — Interval H&P Note (Signed)
History and Physical Interval Note:  01/12/2018 10:57 AM  Ellen [name]  has presented today for surgery, with the diagnosis of BREAST CANCER  The various methods of treatment have been discussed with the patient and family. After consideration of risks, benefits and other options for treatment, the patient has consented to  Procedure(s): BREAST LUMPECTOMY WITH NEEDLE LOCALIZATION (Left) as a surgical intervention .  The patient's history has been reviewed, patient examined, no change in status, stable for surgery.  I have reviewed the patient's chart and labs.  Questions were answered to the patient's satisfaction.     [place]

## 2018-01-12 NOTE — Progress Notes (Addendum)
Pt reporting chest pain 7/10, Dr. Andree Elk (anesthesia) aware, EKG ordered.  OR/Dr. Dahlia Byes called with update.

## 2018-01-12 NOTE — Anesthesia Post-op Follow-up Note (Signed)
Anesthesia QCDR form completed.        

## 2018-01-12 NOTE — Anesthesia Procedure Notes (Signed)
Procedure Name: LMA Insertion Date/Time: 01/12/2018 11:39 AM Performed by: Lavone Orn, CRNA Pre-anesthesia Checklist: Patient identified, Emergency Drugs available, Patient being monitored and Timeout performed Patient Re-evaluated:Patient Re-evaluated prior to induction Oxygen Delivery Method: Circle system utilized Preoxygenation: Pre-oxygenation with 100% oxygen Induction Type: IV induction Ventilation: Mask ventilation without difficulty LMA: LMA inserted LMA Size: 4.0 Number of attempts: 1 Placement Confirmation: positive ETCO2 and breath sounds checked- equal and bilateral Tube secured with: Tape Dental Injury: Teeth and Oropharynx as per pre-operative assessment

## 2018-01-12 NOTE — Transfer of Care (Signed)
Immediate Anesthesia Transfer of Care Note  Patient: Ellen [name]  Procedure(s) Performed: BREAST LUMPECTOMY WITH NEEDLE LOCALIZATION (Left Breast)  Patient Location: PACU  Anesthesia Type:General  Level of Consciousness: awake and drowsy  Airway & Oxygen Therapy: Patient Spontanous Breathing and Patient connected to face mask oxygen  Post-op Assessment: Report given to RN and Post -op Vital signs reviewed and stable  Post vital signs: stable  Last Vitals:  Vitals Value Taken Time  BP 179/69 01/12/2018  1:15 PM  Temp 36.4 C 01/12/2018  1:15 PM  Pulse 65 01/12/2018  1:21 PM  Resp 11 01/12/2018  1:21 PM  SpO2 100 % 01/12/2018  1:21 PM  Vitals shown include unvalidated device data.  Last Pain:  Vitals:   01/12/18 1315  TempSrc:   PainSc: Asleep         Complications: No apparent anesthesia complications

## 2018-01-12 NOTE — Discharge Instructions (Signed)
Lumpectomy, Care After This sheet gives you information about how to care for yourself after your procedure. Your health care provider may also give you more specific instructions. If you have problems or questions, contact your health care provider. What can I expect after the procedure? After the procedure, it is common to have:  Breast swelling.  Breast tenderness.  Stiffness in your arm or shoulder.  A change in the shape and feel of your breast.  Scar tissue that feels hard to the touch in the area where the lump was removed.  Follow these instructions at home: Bathing  Take sponge baths until your health care provider says that you can start showering or bathing.  Do not take baths, swim, or use a hot tub until your health care provider approves. Incision care  Follow instructions from your health care provider about how to take care of your incision. Make sure you: ? Wash your hands with soap and water before you change your bandage (dressing). If soap and water are not available, use hand sanitizer. ? Change your dressing as told by your health care provider. ? Leave stitches (sutures), skin glue, or adhesive strips in place. These skin closures may need to stay in place for 2 weeks or longer. If adhesive strip edges start to loosen and curl up, you may trim the loose edges. Do not remove adhesive strips completely unless your health care provider tells you to do that.  Check your incision area every day for signs of infection. Check for: ? More redness, swelling, or pain. ? More fluid or blood. ? Warmth. ? Pus or a bad smell.  Keep your dressing clean and dry.  If you were sent home with a surgical drain in place, follow instructions from your health care provider about emptying it. Activity  Return to your normal activities as told by your health care provider. Ask your health care provider what activities are safe for you.  Avoid activities that require a lot of  energy (are strenuous).  Be careful to avoid any activities that could cause an injury to your arm on the side of your surgery.  Do not lift anything that is heavier than 10 lb (4.5 kg). Avoid lifting with the arm that is on the side of your surgery.  Do not carry heavy objects on your shoulder.  After your drain is removed, you should perform exercises to keep your arm from getting stiff and swollen. Talk with your health care provider about which exercises are safe for you. General instructions  Take over-the-counter and prescription medicines only as told by your health care provider.  You may eat what you usually do.  Wear a supportive bra as told by your health care provider.  Raise (elevate) your arm above the level of your heart while you are sitting or lying down.  Do not wear tight jewelry on your arm, wrist, or fingers on the side of your surgery. Follow-up  Keep all follow-up visits as told by your health care provider. This is important.  You may need to be screened for extra fluid around the lymph nodes (lymphedema). Follow instructions from your health care provider about how often you should be checked.  If you had any lymph nodes removed during your procedure, be sure to tell all of your health care providers. This is important information to share before you are involved in certain procedures, such as giving blood or having your blood pressure taken. Contact a health care  provider if:  You develop a rash.  You have a fever.  Your pain medicine is not working.  Your swelling, weakness, or numbness in your arm has not improved after a few weeks.  You have new swelling in your breast or arm.  You have more redness, swelling, or pain in your incision area.  You have more fluid or blood coming from your incision.  Your incision feels warm to the touch.  You have pus or a bad smell coming from your incision. Get help right away if:  You have very bad pain in  your breast or arm.  You have chest pain.  You have difficulty breathing. This information is not intended to replace advice given to you by your health care provider. Make sure you discuss any questions you have with your health care provider. Document Released: 02/03/2006 Document Revised: 10/01/2015 Document Reviewed: 10/01/2015 Elsevier Interactive Patient Education  2018 [employer].

## 2018-01-12 NOTE — Op Note (Addendum)
  Pre-operative Diagnosis: DCIS Left upper outer quadrant   Post-operative Diagnosis: Same  Surgeon: Caroleen Hamman, MD FACS  Anesthesia: General  Procedure: Left Partial mastectomy, needle directed  Findings: Gross pathology negative margins. Xray clip and wire within excised specimen.  Estimated Blood Loss: 10cc        Drains: None         Specimens: partial mastectomy with labels       Complications: none                Condition: Stable   Procedure Details  The patient was seen again in the Holding Room. The benefits, complications, treatment options, and expected outcomes were discussed with the patient. The risks of bleeding, infection, recurrence of symptoms, failure to resolve symptoms, hematoma, seroma, open wound, cosmetic deformity, and the need for further surgery were discussed.  The patient was taken to Operating Room, identified as Ellen [name] and the procedure verified.  A Time Out was held and the above information confirmed.  Personally reviewed the mammograms as well as imaging studies and discussed them with the radiologist.  On clinical exam there was a firm mass at 2:00 that was away from the wire for about 3 cm or so.  Likely related to hematoma.  This is of the uncertainty of this lesion I decided to incorporate the area within the lumpectomy specimen  Prior to the induction of general anesthesia, antibiotic prophylaxis was administered. VTE prophylaxis was in place. Appropriate anesthesia was then administered and tolerated well. The chest was prepped with Chloraprep and draped in the sterile fashion. The patient was positioned in the supine position.   Attention was turned to the needle localization site where an incision was made encompassing the needle insertion site and the prior biopsy site . Dissection around the needle to perform a partial mastectomy with adequate margins was performed.  This note that I incorporated the palpable area corresponding to a  hematoma from the biopsy.  This was done with electrocautery and sharp dissection. Hemostasis was with electrocautery. Additional Marcaine was infiltrated into the skin and subcutaneous tissues of the cavity. Once assuring that hemostasis was adequate and checked multiple times the wound was closed with interrupted 3-0 Vicryl followed by 4-0 subcuticular Monocryl sutures.  Dermabond was placed./  Patient was taken to the recovery room in stable condition where a postoperative chest film has been ordered.    Caroleen Hamman, MD, FACS

## 2018-01-12 NOTE — Progress Notes (Signed)
Stat EKG reviewed by Dr. Andree Elk (anesthesia) and Dr. Dahlia Byes.  Proceed to surgery.  Family called, unable to visit before transport to OR.

## 2018-01-12 NOTE — Anesthesia Preprocedure Evaluation (Signed)
Anesthesia Evaluation  Patient identified by MRN, date of birth, ID band Patient awake    Reviewed: Allergy & Precautions, H&P , NPO status , Patient's Chart, lab work & pertinent test results, reviewed documented beta blocker date and time   Airway Mallampati: II  TM Distance: >3 FB Neck ROM: full    Dental  (+) Teeth Intact   Pulmonary neg pulmonary ROS,    Pulmonary exam normal        Cardiovascular Exercise Tolerance: Poor hypertension, On Medications negative cardio ROS Normal cardiovascular exam Rate:Normal  Preop complaint of fleeting right side chest pain with spont. Resolution.  Ekg yielded old ant and no evidence of active ischemia.  Pain completely resolved after 3 min. JA   Neuro/Psych  Headaches, negative psych ROS   GI/Hepatic negative GI ROS, Neg liver ROS,   Endo/Other  negative endocrine ROSdiabetes  Renal/GU Renal disease  negative genitourinary   Musculoskeletal   Abdominal   Peds  Hematology negative hematology ROS (+)   Anesthesia Other Findings   Reproductive/Obstetrics negative OB ROS                             Anesthesia Physical Anesthesia Plan  ASA: III  Anesthesia Plan: General LMA   Post-op Pain Management:    Induction:   PONV Risk Score and Plan:   Airway Management Planned:   Additional Equipment:   Intra-op Plan:   Post-operative Plan:   Informed Consent: I have reviewed the patients History and Physical, chart, labs and discussed the procedure including the risks, benefits and alternatives for the proposed anesthesia with the patient or authorized representative who has indicated his/her understanding and acceptance.     Plan Discussed with: CRNA  Anesthesia Plan Comments:         Anesthesia Quick Evaluation

## 2018-01-13 ENCOUNTER — Encounter: Payer: Self-pay | Admitting: Surgery

## 2018-01-16 ENCOUNTER — Encounter: Payer: Self-pay | Admitting: Surgery

## 2018-01-16 NOTE — Anesthesia Postprocedure Evaluation (Signed)
Anesthesia Post Note  Patient: Ellen [name]  Procedure(s) Performed: BREAST LUMPECTOMY WITH NEEDLE LOCALIZATION (Left Breast)  Patient location during evaluation: PACU Anesthesia Type: General Level of consciousness: awake and alert Pain management: pain level controlled Vital Signs Assessment: post-procedure vital signs reviewed and stable Respiratory status: spontaneous breathing, nonlabored ventilation, respiratory function stable and patient connected to nasal cannula oxygen Cardiovascular status: blood pressure returned to baseline and stable Postop Assessment: no apparent nausea or vomiting Anesthetic complications: no     Last Vitals:  Vitals:   01/12/18 1423 01/12/18 1607  BP: (!) 163/63 (!) 156/75  Pulse: 77 77  Resp: 14 18  Temp: (!) 36.2 C 36.8 C  SpO2: 95% 100%    Last Pain:  Vitals:   01/12/18 1615  TempSrc:   PainSc: 0-No pain                 Molli Barrows

## 2018-01-17 ENCOUNTER — Telehealth: Payer: Self-pay | Admitting: *Deleted

## 2018-01-17 ENCOUNTER — Telehealth: Payer: Self-pay | Admitting: Surgery

## 2018-01-17 NOTE — Telephone Encounter (Signed)
Tried to call patient daughter today , We left message for her to call us back  Patient called back and states it was her daughter that called [place]. Patient states she will see [place] on Monday .

## 2018-01-17 NOTE — Telephone Encounter (Signed)
Daughter called and wanted to know why her mother was upset. Advised daughter that I talk to her mom and she is coming in n Monday to talk with Dr. Dahlia Byes about the report -Path of her surgery. Daughter states she will be on the phone so she can her what Dr. Dahlia Byes tells her mother.

## 2018-01-17 NOTE — Telephone Encounter (Signed)
Patients daughter called and stated that the patient is really upset about the phone call she got earlier from Dr.Pabon. She stated it was about test results but she is confused because the patient thought everything was fine. Please call the daughter back to explain. Patient is confused.

## 2018-01-17 NOTE — Telephone Encounter (Signed)
Please call patients daughter 

## 2018-01-17 NOTE — Telephone Encounter (Signed)
D/w pt about path results. Specifically about now the final path showing invasive CA and my recs for SLBx. She understands and will come next Monday to have a formal discussion w me

## 2018-01-20 ENCOUNTER — Other Ambulatory Visit: Payer: Self-pay | Admitting: Pathology

## 2018-01-20 LAB — SURGICAL PATHOLOGY

## 2018-01-23 ENCOUNTER — Telehealth: Payer: Self-pay | Admitting: *Deleted

## 2018-01-23 ENCOUNTER — Other Ambulatory Visit: Payer: Self-pay

## 2018-01-23 ENCOUNTER — Encounter: Payer: Self-pay | Admitting: *Deleted

## 2018-01-23 ENCOUNTER — Encounter: Payer: Self-pay | Admitting: Surgery

## 2018-01-23 ENCOUNTER — Ambulatory Visit (INDEPENDENT_AMBULATORY_CARE_PROVIDER_SITE_OTHER): Payer: Medicare Other | Admitting: Surgery

## 2018-01-23 VITALS — BP 182/82 | HR 78 | Temp 97.5°F | Ht 60.0 in | Wt 158.0 lb

## 2018-01-23 DIAGNOSIS — D0512 Intraductal carcinoma in situ of left breast: Secondary | ICD-10-CM

## 2018-01-23 DIAGNOSIS — R6889 Other general symptoms and signs: Secondary | ICD-10-CM | POA: Diagnosis not present

## 2018-01-23 DIAGNOSIS — C50412 Malignant neoplasm of upper-outer quadrant of left female breast: Secondary | ICD-10-CM

## 2018-01-23 NOTE — Progress Notes (Signed)
Patient's surgery to be scheduled for 02-07-18 at [facility] with Dr. Dahlia Byes. (Appointments with Dr. Baruch Gouty and Dr. Grayland Ormond need to be after surgery is completed. Patient is presently scheduled to see them on 02-09-2018.)  The patient is aware she will need to Pre-Admit prior to surgery. Patient will check in at the [facility], [address] (first floor). The patient will be contacted once this has been scheduled to inform her of date and time.  The patient is aware to call the office should she have further questions.

## 2018-01-23 NOTE — H&P (View-Only) (Signed)
Ellen [name] is following up after her lumpectomy for DCIS.  Final pathology showed evidence of invasive cancer.  I had a lengthy discussion with the patient and the family to include daughter and granddaughter.  I have explained to that that the pathology has been aggravated from DCIS to invasive cancer. Also discussed the need for appropriate staging to include sentinel lymph node biopsy.  History they were disappointed and upset but they showed understanding after my thorough explanation. The lumpectomy site she is doing very well and has no major complaints other than some hypersensitivity and pain.  ROS: otherwise negative  PE NAD Chest: no resp distress.  Breast: incisions c/d/i, no infection Abd: soft, nt  A/P invasive breast cancer, given that the patient had a lumpectomy initially for DCIS there is still the need for appropriate staging to include sentinel lymph node biopsy.  I had a lengthy discussion with them regarding the proposed procedure.  I specifically discussed with her about the previous biopsy that she had showing no evidence of cancer within the node. I have discussed with her in detail about the procedure.  Risk benefit and possible complications to include but not limited to: Bleeding, infection, seroma, and lymphedema.  They understand and wish to proceed. PLAN: SLNBx left

## 2018-01-23 NOTE — Telephone Encounter (Signed)
Patient contacted today and notified that her surgery has been posted on 02-07-2018. She will need to check-in at the radiology desk (2nd desk on right) in the [place] at 12 noon.   The patient does not need to Pre-admit per Pre-admission Testing. This was just completed on 01-11-18 for a previous procedure and she can just follow same instructions. Patient verbalizes understanding.

## 2018-01-23 NOTE — Patient Instructions (Addendum)
Patient is to be scheduled for surgery with Dr.Pabon. Please reschedule her appointments with Dr.Chrystal and Dr.Finnegan.  Call the office with any questions or concerns.

## 2018-01-23 NOTE — Progress Notes (Signed)
Ellen [name] is following up after her lumpectomy for DCIS.  Final pathology showed evidence of invasive cancer.  I had a lengthy discussion with the patient and the family to include daughter and granddaughter.  I have explained to that that the pathology has been aggravated from DCIS to invasive cancer. Also discussed the need for appropriate staging to include sentinel lymph node biopsy.  History they were disappointed and upset but they showed understanding after my thorough explanation. The lumpectomy site she is doing very well and has no major complaints other than some hypersensitivity and pain.  ROS: otherwise negative  PE NAD Chest: no resp distress.  Breast: incisions c/d/i, no infection Abd: soft, nt  A/P invasive breast cancer, given that the patient had a lumpectomy initially for DCIS there is still the need for appropriate staging to include sentinel lymph node biopsy.  I had a lengthy discussion with them regarding the proposed procedure.  I specifically discussed with her about the previous biopsy that she had showing no evidence of cancer within the node. I have discussed with her in detail about the procedure.  Risk benefit and possible complications to include but not limited to: Bleeding, infection, seroma, and lymphedema.  They understand and wish to proceed. PLAN: SLNBx left

## 2018-02-03 ENCOUNTER — Ambulatory Visit: Payer: Medicare Other | Admitting: Family Medicine

## 2018-02-03 NOTE — Progress Notes (Signed)
[place]  [phone] [phone]) [phone]  ID: Ellen [name] OB: [birth year]  MR#: [id number]  POE#:[id number]  Patient Care Team: [name], DO as PCP - General (Family Medicine)  CHIEF COMPLAINT: Stage Ia ER/PR positive, HER-2 negative invasive carcinoma of the left upper outer quadrant breast.   INTERVAL HISTORY: Patient returns to clinic today for further evaluation and additional treatment planning.  She underwent lumpectomy on January 12, 2018 which revealed an invasive component, therefore is returning to surgery in approximately 1 week for sentinel node biopsy.  She currently feels well and is asymptomatic. She remains active and only retired 2 years ago.  She has no neurologic complaints.  She denies any recent fevers or illnesses.  She has a good appetite and denies weight loss.  She has no chest pain or shortness of breath.  She denies any nausea, vomiting, constipation, or diarrhea.  She has no urinary complaints.  Patient offers no specific complaints today.  REVIEW OF SYSTEMS:   Review of Systems  Constitutional: Negative.  Negative for fever, malaise/fatigue and weight loss.  Respiratory: Negative.  Negative for cough, hemoptysis and shortness of breath.   Cardiovascular: Negative.  Negative for chest pain and leg swelling.  Gastrointestinal: Negative.  Negative for abdominal pain, blood in stool and melena.  Genitourinary: Negative.  Negative for dysuria.  Musculoskeletal: Negative.  Negative for back pain.  Skin: Negative.  Negative for rash.  Neurological: Negative.  Negative for focal weakness, weakness and headaches.  Psychiatric/Behavioral: Negative.  The patient is not nervous/anxious.     As per HPI. Otherwise, a complete review of systems is negative.  PAST MEDICAL HISTORY: Past Medical History:  Diagnosis Date  . Arthritis   . Cancer ([place]) 01/2018   left breast   . Cataract   . Chronic headaches   . CKD  (chronic kidney disease)   . Diabetes mellitus without complication ([place])    no longer taking metformin; diet controlled now  . Diverticulitis   . History of syncope   . Hypertension   . Multinodular goiter   . Primary osteoarthritis of left knee   . Pulmonary hypertension ([place])     PAST SURGICAL HISTORY: Past Surgical History:  Procedure Laterality Date  . ABDOMINAL HYSTERECTOMY    . APPENDECTOMY    . BREAST BIOPSY Left 01/03/2018   left breast stereo/x clip/path pending  . BREAST LUMPECTOMY Left 01/12/2018  . BREAST LUMPECTOMY WITH NEEDLE LOCALIZATION Left 01/12/2018   Procedure: BREAST LUMPECTOMY WITH NEEDLE LOCALIZATION;  Surgeon: Jules Husbands, MD;  Location: [facility] ORS;  Service: General;  Laterality: Left;  . CHOLECYSTECTOMY    . COLON SURGERY    . COLOSTOMY  2008  . COLOSTOMY REVERSAL  2008  . EYE SURGERY    . REPLACEMENT TOTAL KNEE Right 2018    FAMILY HISTORY: Family History  Problem Relation Age of Onset  . Lung cancer Mother   . Diabetes Father   . Stroke Father   . Hypertension Maternal Grandmother   . Hypertension Maternal Uncle   . Hypertension Maternal Aunt   . Ovarian cancer Maternal Aunt   . Heart disease Daughter     ADVANCED DIRECTIVES (Y/N):  N  HEALTH MAINTENANCE: Social History   Tobacco Use  . Smoking status: Never Smoker  . Smokeless tobacco: Never Used  Substance Use Topics  . Alcohol use: Never    Frequency: Never  . Drug use: Never     Colonoscopy:  PAP:  Bone  density:  Lipid panel:  Allergies  Allergen Reactions  . Codeine Nausea Only  . Morphine And Related Nausea Only  . Morpholine Salicylate Nausea Only, Palpitations and Other (See Comments)    Heart races as well    Current Outpatient Medications  Medication Sig Dispense Refill  . acetaminophen (TYLENOL) 500 MG tablet Take 1,000 mg by mouth every 6 (six) hours as needed for moderate pain or headache.     Marland Kitchen amLODipine (NORVASC) 5 MG tablet Take 5 mg by mouth daily.      Marland Kitchen aspirin 81 MG chewable tablet Chew 81 mg by mouth daily.     . cloNIDine (CATAPRES) 0.1 MG tablet TAKE 1 TABLET BY MOUTH DAILY AS NEEDED FOR ELEVATED BLOOD PRESSURE GREATER THAN 180/100 (Patient taking differently: Take 0.1 mg by mouth daily as needed (for BP > 180/100). ) 90 tablet 1  . cyanocobalamin 100 MCG tablet Take by mouth.    . gabapentin (NEURONTIN) 100 MG capsule Start 1 capsule daily, increase by 1 cap every 1 week as tolerated, next add 1 in evening, up to max 3 pills in evening (Patient taking differently: Take 100 mg by mouth daily. ) 90 capsule 1  . HYDROcodone-acetaminophen (NORCO/VICODIN) 5-325 MG tablet Take 1 tablet by mouth every 6 (six) hours as needed for moderate pain. 20 tablet 0  . lisinopril-hydrochlorothiazide (PRINZIDE,ZESTORETIC) 20-12.5 MG tablet Take 1 tablet by mouth daily. 30 tablet 3  . Multiple Vitamins-Minerals (CENTRUM SILVER 50+WOMEN) TABS Take 1 tablet by mouth daily.    . Multiple Vitamins-Minerals (MULTIVITAMIN & MINERAL PO) Take by mouth.    . pravastatin (PRAVACHOL) 20 MG tablet Take 20 mg by mouth at bedtime.   3   No current facility-administered medications for this visit.     OBJECTIVE: There were no vitals filed for this visit.   There is no height or weight on file to calculate BMI.    ECOG FS:0 - Asymptomatic  General: Well-developed, well-nourished, no acute distress. Eyes: Pink conjunctiva, anicteric sclera. HEENT: Normocephalic, moist mucous membranes. Breast: Exam deferred today. Lungs: Clear to auscultation bilaterally. Heart: Regular rate and rhythm. No rubs, murmurs, or gallops. Abdomen: Soft, nontender, nondistended. No organomegaly noted, normoactive bowel sounds. Musculoskeletal: No edema, cyanosis, or clubbing. Neuro: Alert, answering all questions appropriately. Cranial nerves grossly intact. Skin: No rashes or petechiae noted. Psych: Normal affect.  LAB RESULTS:  Lab Results  Component Value Date   NA 139  01/11/2018   K 3.6 01/11/2018   CL 99 01/11/2018   CO2 28 01/11/2018   GLUCOSE 111 (H) 01/11/2018   BUN 19 01/11/2018   CREATININE 1.39 (H) 01/11/2018   CALCIUM 10.6 (H) 01/11/2018   PROT 6.5 09/18/2017   ALBUMIN 3.1 (L) 09/18/2017   AST 26 09/18/2017   ALT 9 09/18/2017   ALKPHOS 42 09/18/2017   BILITOT 1.0 09/18/2017   GFRNONAA 34 (L) 01/11/2018   GFRAA 40 (L) 01/11/2018    Lab Results  Component Value Date   WBC 7.7 01/11/2018   NEUTROABS 2.4 08/06/2017   HGB 12.9 01/11/2018   HCT 41.2 01/11/2018   MCV 85.1 01/11/2018   PLT 212 01/11/2018     STUDIES: Nm Sentinel Node Injection  Result Date: 02/07/2018 CLINICAL DATA:  Left breast cancer. EXAM: NUCLEAR MEDICINE BREAST LYMPHOSCINTIGRAPHY TECHNIQUE: Intradermal injection of radiopharmaceutical was performed at the 12 o'clock, 3 o'clock, 6 o'clock, and 9 o'clock positions around the left nipple. The patient was then sent to the operating room where the sentinel  node(s) were identified and removed by the surgeon. RADIOPHARMACEUTICALS:  Total of 1.11 mCi Millipore-filtered Technetium-104msulfur colloid, injected in four equal aliquots. IMPRESSION: Uncomplicated intradermal injection of a total of 1.11 mCi Technetium-941mulfur colloid for purposes of sentinel node identification. Electronically Signed   By: AdMarkus Daft.D.   On: 02/07/2018 12:46   Mm Breast Surgical Specimen  Result Date: 01/12/2018 CLINICAL DATA:  Evaluate specimen EXAM: SPECIMEN RADIOGRAPH OF THE LEFT BREAST COMPARISON:  Previous exam(s). FINDINGS: Status post excision of the left breast. The wire tip and biopsy marker clip are present and are marked for pathology. IMPRESSION: Specimen radiograph of the left breast. Electronically Signed   By: DaDorise BullionII M.D   On: 01/12/2018 12:43   Mm Left Plc Breast Loc Dev   1st Lesion  Inc Mammo Guide  Result Date: 01/12/2018 CLINICAL DATA:  Needle localization prior to surgery EXAM: NEEDLE LOCALIZATION OF THE LEFT  BREAST WITH MAMMO GUIDANCE COMPARISON:  Previous exams. FINDINGS: Patient presents for needle localization prior to surgery. I met with the patient and we discussed the procedure of needle localization including benefits and alternatives. We discussed the high likelihood of a successful procedure. We discussed the risks of the procedure, including infection, bleeding, tissue injury, and further surgery. Informed, written consent was given. The usual time-out protocol was performed immediately prior to the procedure. Using mammographic guidance, sterile technique, 1% lidocaine and a 7 cm modified Kopans needle, the biopsy clip was localized using lateral approach. The images were marked for the surgeon. IMPRESSION: Needle localization left breast. No apparent complications. Electronically Signed   By: DaDorise BullionII M.D   On: 01/12/2018 09:30    ASSESSMENT: Stage Ia ER/PR positive, HER-2 negative invasive carcinoma of the left upper outer quadrant breast.    PLAN:  1. Stage Ia ER/PR positive, HER-2 negative invasive carcinoma of the left upper outer quadrant breast: Given the invasive component found on final pathology, patient is returning to the operating room next week for sentinel lymph node biopsy.  Given her advanced age, will likely not use chemotherapy therefore for did not sent for Oncotype DX.  If she had lymph node positive disease, would then reconsider.  Patient had consultation with radiation oncology today and will require adjuvant XRT at the completion of her surgeries.  She also will benefit from 5 years of letrozole at the conclusion of our treatments.  Return to clinic in 3 months for further evaluation and initiation of letrozole.    I spent a total of 30 minutes face-to-face with the patient of which greater than 50% of the visit was spent in counseling and coordination of care as detailed above.   Patient expressed understanding and was in agreement with this plan. She also  understands that She can call clinic at any time with any questions, concerns, or complaints.   Cancer Staging Primary cancer of upper outer quadrant of left female breast (HCommon[facility] Eye SurgeryStaging form: Breast, AJCC 8th Edition - Clinical stage from 01/11/2018: Stage IA (cT1b, cN0, cM0, G2, ER+, PR+, HER2-) - Signed by FiLloyd HugerMD on 02/10/2018   TiLloyd HugerMD   02/10/2018 3:04 PM

## 2018-02-06 MED ORDER — CEFAZOLIN SODIUM-DEXTROSE 2-4 GM/100ML-% IV SOLN: 2.0000 g | INTRAVENOUS | Status: DC

## 2018-02-07 ENCOUNTER — Ambulatory Visit
Admission: RE | Admit: 2018-02-07 | Discharge: 2018-02-07 | Disposition: A | Discharge status: Home / Self Care | Payer: Medicare Other | Source: Ambulatory Visit | Attending: Surgery | Admitting: Surgery

## 2018-02-07 ENCOUNTER — Encounter
Admission: RE | Disposition: A | Discharge status: Home / Self Care | Payer: Self-pay | Source: Home / Self Care | Attending: Surgery

## 2018-02-07 ENCOUNTER — Encounter: Payer: Self-pay | Admitting: *Deleted

## 2018-02-07 ENCOUNTER — Ambulatory Visit
Admission: RE | Admit: 2018-02-07 | Discharge: 2018-02-07 | Disposition: A | Discharge status: Home / Self Care | Payer: Medicare Other | Attending: Surgery | Admitting: Surgery

## 2018-02-07 DIAGNOSIS — Z538 Procedure and treatment not carried out for other reasons: Secondary | ICD-10-CM | POA: Diagnosis not present

## 2018-02-07 DIAGNOSIS — C50412 Malignant neoplasm of upper-outer quadrant of left female breast: Secondary | ICD-10-CM

## 2018-02-07 DIAGNOSIS — D0512 Intraductal carcinoma in situ of left breast: Secondary | ICD-10-CM

## 2018-02-07 DIAGNOSIS — C50912 Malignant neoplasm of unspecified site of left female breast: Secondary | ICD-10-CM | POA: Diagnosis not present

## 2018-02-07 LAB — GLUCOSE, CAPILLARY: Glucose-Capillary: 76 mg/dL (ref 70–99)

## 2018-02-07 SURGERY — BIOPSY, LYMPH NODE, SENTINEL
Anesthesia: General | Laterality: Left

## 2018-02-07 MED ORDER — TECHNETIUM TC 99M SULFUR COLLOID FILTERED
1.1100 | Freq: Once | INTRAVENOUS | Status: AC | PRN
  Administered 2018-02-07: 1.11 via INTRADERMAL

## 2018-02-07 SURGICAL SUPPLY — 19 items
APPLIER CLIP 11 MED OPEN (CLIP) ×3
APPLIER CLIP 9.375 SM OPEN (CLIP) ×3
CANISTER SUCT 1200ML W/VALVE (MISCELLANEOUS) ×3 IMPLANT
CHLORAPREP W/TINT 26ML (MISCELLANEOUS) ×3 IMPLANT
CLIP APPLIE 11 MED OPEN (CLIP) ×1 IMPLANT
CLIP APPLIE 9.375 SM OPEN (CLIP) ×1 IMPLANT
COVER PROBE FLX POLY STRL (MISCELLANEOUS) ×3 IMPLANT
COVER WAND RF STERILE (DRAPES) ×3 IMPLANT
DRAPE LAPAROTOMY 100X77 ABD (DRAPES) ×3 IMPLANT
ELECT REM PT RETURN 9FT ADLT (ELECTROSURGICAL) ×3
ELECTRODE REM PT RTRN 9FT ADLT (ELECTROSURGICAL) ×1 IMPLANT
GAUZE SPONGE 4X4 12PLY STRL (GAUZE/BANDAGES/DRESSINGS) ×3 IMPLANT
GLOVE BIO SURGEON STRL SZ7 (GLOVE) ×3 IMPLANT
GLOVE INDICATOR 7.5 STRL GRN (GLOVE) ×3 IMPLANT
GOWN STRL REUS W/ TWL LRG LVL3 (GOWN DISPOSABLE) ×1 IMPLANT
GOWN STRL REUS W/TWL LRG LVL3 (GOWN DISPOSABLE) ×2
NEEDLE HYPO 22GX1.5 SAFETY (NEEDLE) ×3 IMPLANT
PACK BASIN MINOR ARMC (MISCELLANEOUS) ×3 IMPLANT
SPONGE LAP 18X18 RF (DISPOSABLE) ×3 IMPLANT

## 2018-02-07 NOTE — Progress Notes (Signed)
Patient states she had eaten a sausage egg and cheese biscuit this morning for breakfast completing that meal at 8:30 AM.  Per Dr. Andree Elk unable to proceed with surgery until after 4:30 this afternoon.  Per Abby in nuclear medicine patient was injected at 12:17 and injection is active for 4 hours.  Per Dr. Dahlia Byes to reschedule procedure for another day.  Patient and daughter with understanding of need for reschedule.  Will call Dr. Barb Merino office tomorrow. Patient left via wheelchair with family present.

## 2018-02-09 ENCOUNTER — Encounter: Payer: Self-pay | Admitting: *Deleted

## 2018-02-09 ENCOUNTER — Encounter (INDEPENDENT_AMBULATORY_CARE_PROVIDER_SITE_OTHER): Payer: Self-pay

## 2018-02-09 ENCOUNTER — Other Ambulatory Visit: Payer: Self-pay

## 2018-02-09 ENCOUNTER — Encounter: Payer: Self-pay | Admitting: Radiation Oncology

## 2018-02-09 ENCOUNTER — Inpatient Hospital Stay: Payer: Medicare Other | Attending: Oncology | Admitting: Oncology

## 2018-02-09 ENCOUNTER — Telehealth: Payer: Self-pay | Admitting: Surgery

## 2018-02-09 ENCOUNTER — Ambulatory Visit
Admission: RE | Admit: 2018-02-09 | Discharge: 2018-02-09 | Disposition: A | Discharge status: Home / Self Care | Payer: Medicare Other | Source: Ambulatory Visit | Attending: Radiation Oncology | Admitting: Radiation Oncology

## 2018-02-09 ENCOUNTER — Telehealth: Payer: Self-pay | Admitting: *Deleted

## 2018-02-09 VITALS — BP 155/74 | HR 69 | Temp 97.1°F | Resp 16 | Wt 156.5 lb

## 2018-02-09 DIAGNOSIS — I272 Pulmonary hypertension, unspecified: Secondary | ICD-10-CM | POA: Insufficient documentation

## 2018-02-09 DIAGNOSIS — Z8719 Personal history of other diseases of the digestive system: Secondary | ICD-10-CM | POA: Insufficient documentation

## 2018-02-09 DIAGNOSIS — Z801 Family history of malignant neoplasm of trachea, bronchus and lung: Secondary | ICD-10-CM | POA: Diagnosis not present

## 2018-02-09 DIAGNOSIS — Z90710 Acquired absence of both cervix and uterus: Secondary | ICD-10-CM | POA: Insufficient documentation

## 2018-02-09 DIAGNOSIS — Z8041 Family history of malignant neoplasm of ovary: Secondary | ICD-10-CM | POA: Insufficient documentation

## 2018-02-09 DIAGNOSIS — N189 Chronic kidney disease, unspecified: Secondary | ICD-10-CM | POA: Insufficient documentation

## 2018-02-09 DIAGNOSIS — C50412 Malignant neoplasm of upper-outer quadrant of left female breast: Secondary | ICD-10-CM | POA: Diagnosis not present

## 2018-02-09 DIAGNOSIS — D0512 Intraductal carcinoma in situ of left breast: Secondary | ICD-10-CM | POA: Diagnosis not present

## 2018-02-09 DIAGNOSIS — Z170 Estrogen receptor positive status [ER+]: Secondary | ICD-10-CM | POA: Insufficient documentation

## 2018-02-09 DIAGNOSIS — Z79899 Other long term (current) drug therapy: Secondary | ICD-10-CM | POA: Insufficient documentation

## 2018-02-09 DIAGNOSIS — I129 Hypertensive chronic kidney disease with stage 1 through stage 4 chronic kidney disease, or unspecified chronic kidney disease: Secondary | ICD-10-CM | POA: Diagnosis not present

## 2018-02-09 DIAGNOSIS — Z7982 Long term (current) use of aspirin: Secondary | ICD-10-CM | POA: Insufficient documentation

## 2018-02-09 DIAGNOSIS — E119 Type 2 diabetes mellitus without complications: Secondary | ICD-10-CM | POA: Insufficient documentation

## 2018-02-09 DIAGNOSIS — I10 Essential (primary) hypertension: Secondary | ICD-10-CM | POA: Diagnosis not present

## 2018-02-09 DIAGNOSIS — Z8249 Family history of ischemic heart disease and other diseases of the circulatory system: Secondary | ICD-10-CM | POA: Diagnosis not present

## 2018-02-09 DIAGNOSIS — M1712 Unilateral primary osteoarthritis, left knee: Secondary | ICD-10-CM | POA: Insufficient documentation

## 2018-02-09 DIAGNOSIS — E042 Nontoxic multinodular goiter: Secondary | ICD-10-CM | POA: Diagnosis not present

## 2018-02-09 NOTE — Telephone Encounter (Signed)
Pt schedule for SLNBx, unfortunately she had breakfast that morning and we were not able to perform her surgery due to need for 8 hr fasting. Attempted multiple call. No answer. We will ask office to contact her to reschedule procedure.

## 2018-02-09 NOTE — Progress Notes (Signed)
Patient here today for follow up regarding DCIS, seen by Dr. Baruch Gouty today also.

## 2018-02-09 NOTE — Telephone Encounter (Signed)
Patient was contacted today at the request of Dr. Dahlia Byes.   The patient was scheduled for surgery on 02-07-18 and ate the morning of surgery. Patient was injected through Nuclear Medicine but they were unable to do surgery since she ate.   Patient reminded today to be NPO after midnight. She wishes to reschedule surgery to 02-16-18 with Dr. Dahlia Byes at [facility]   Will have the scheduling staff call patient tomorrow with new arrival time day of surgery.

## 2018-02-09 NOTE — Consult Note (Signed)
NEW PATIENT EVALUATION  Name: Ellen [name]  MRN: [id number]  Date:   02/09/2018     DOB: 11-12-1929   This 85 y.o. female patient presents to the clinic for initial evaluation of probable stage I invasive mammary carcinoma of the left breast status post wide local excision now pending sentinel node biopsy based on findings focus of invasive mammary carcinoma.  REFERRING PHYSICIAN: [name] *  CHIEF COMPLAINT:  Chief Complaint  Patient presents with  . Breast Cancer    New consultation of breast cancer    DIAGNOSIS: The encounter diagnosis was Ductal carcinoma in situ (DCIS) of left breast.   PREVIOUS INVESTIGATIONS:  Mammogram and ultrasound reviewed Clinical notes reviewed Pathology reports reviewed  HPI: patient is a 85 year old female who presented with an abnormal mammogram of her left breast showing suspicious mass and architectural distortion the upper outer quadrant of the left breast and a left axillary lymph node with focal cortical thickening.Stereotactic guided biopsy was performed showing ductal carcinoma in situ.tumor was nuclear grade 2-3 with cribriform type. Ultrasound guided biopsy of lymph node with cortical thickening was negative for malignancy.she underwent a wide local excision showing a 9 mm focus of invasive mammary carcinoma overall grade 2 with margins clear at 8 mm. No lymph nodes were submitted. She had no lymphovascular invasion. She is scheduled for sentinel node biopsy which had to be rescheduled. She is seen today for radiation oncology opinion.tumor was ER/PR positive HER-2/neu not overexpressed.at the present time she is doing well specifically denies breast tenderness cough or bone pain.  PLANNED TREATMENT REGIMEN: left whole breast radiation  PAST MEDICAL HISTORY:  has a past medical history of Arthritis, Cancer ([place]) (01/2018), Cataract, Chronic headaches, CKD (chronic kidney disease), Diabetes mellitus without complication ([place]),  Diverticulitis, History of syncope, Hypertension, Multinodular goiter, Primary osteoarthritis of left knee, and Pulmonary hypertension ([place]).    PAST SURGICAL HISTORY:  Past Surgical History:  Procedure Laterality Date  . ABDOMINAL HYSTERECTOMY    . APPENDECTOMY    . BREAST BIOPSY Left 01/03/2018   left breast stereo/x clip/path pending  . BREAST LUMPECTOMY Left 01/12/2018  . BREAST LUMPECTOMY WITH NEEDLE LOCALIZATION Left 01/12/2018   Procedure: BREAST LUMPECTOMY WITH NEEDLE LOCALIZATION;  Surgeon: Jules Husbands, MD;  Location: [facility] ORS;  Service: General;  Laterality: Left;  . CHOLECYSTECTOMY    . COLON SURGERY    . COLOSTOMY  2008  . COLOSTOMY REVERSAL  2008  . EYE SURGERY    . REPLACEMENT TOTAL KNEE Right 2018    FAMILY HISTORY: family history includes Diabetes in her father; Heart disease in her daughter; Hypertension in her maternal aunt, maternal grandmother, and maternal uncle; Lung cancer in her mother; Ovarian cancer in her maternal aunt; Stroke in her father.  SOCIAL HISTORY:  reports that she has never smoked. She has never used smokeless tobacco. She reports that she does not drink alcohol or use drugs.  ALLERGIES: Codeine; Morphine and related; and Morpholine salicylate  MEDICATIONS:  Current Outpatient Medications  Medication Sig Dispense Refill  . acetaminophen (TYLENOL) 500 MG tablet Take 1,000 mg by mouth every 6 (six) hours as needed for moderate pain or headache.     Marland Kitchen amLODipine (NORVASC) 5 MG tablet Take 5 mg by mouth daily.     Marland Kitchen aspirin 81 MG chewable tablet Chew 81 mg by mouth daily.     . cloNIDine (CATAPRES) 0.1 MG tablet TAKE 1 TABLET BY MOUTH DAILY AS NEEDED FOR ELEVATED BLOOD PRESSURE GREATER THAN 180/100 (  Patient taking differently: Take 0.1 mg by mouth daily as needed (for BP > 180/100). ) 90 tablet 1  . cyanocobalamin 100 MCG tablet Take by mouth.    . gabapentin (NEURONTIN) 100 MG capsule Start 1 capsule daily, increase by 1 cap every 1 week as  tolerated, next add 1 in evening, up to max 3 pills in evening (Patient taking differently: Take 100 mg by mouth daily. ) 90 capsule 1  . HYDROcodone-acetaminophen (NORCO/VICODIN) 5-325 MG tablet Take 1 tablet by mouth every 6 (six) hours as needed for moderate pain. 20 tablet 0  . lisinopril-hydrochlorothiazide (PRINZIDE,ZESTORETIC) 20-12.5 MG tablet Take 1 tablet by mouth daily. 30 tablet 3  . Multiple Vitamins-Minerals (CENTRUM SILVER 50+WOMEN) TABS Take 1 tablet by mouth daily.    . Multiple Vitamins-Minerals (MULTIVITAMIN & MINERAL PO) Take by mouth.    . pravastatin (PRAVACHOL) 20 MG tablet Take 20 mg by mouth at bedtime.   3   No current facility-administered medications for this encounter.     ECOG PERFORMANCE STATUS:  0 - Asymptomatic  REVIEW OF SYSTEMS:  Patient denies any weight loss, fatigue, weakness, fever, chills or night sweats. Patient denies any loss of vision, blurred vision. Patient denies any ringing  of the ears or hearing loss. No irregular heartbeat. Patient denies heart murmur or history of fainting. Patient denies any chest pain or pain radiating to her upper extremities. Patient denies any shortness of breath, difficulty breathing at night, cough or hemoptysis. Patient denies any swelling in the lower legs. Patient denies any nausea vomiting, vomiting of blood, or coffee ground material in the vomitus. Patient denies any stomach pain. Patient states has had normal bowel movements no significant constipation or diarrhea. Patient denies any dysuria, hematuria or significant nocturia. Patient denies any problems walking, swelling in the joints or loss of balance. Patient denies any skin changes, loss of hair or loss of weight. Patient denies any excessive worrying or anxiety or significant depression. Patient denies any problems with insomnia. Patient denies excessive thirst, polyuria, polydipsia. Patient denies any swollen glands, patient denies easy bruising or easy bleeding.  Patient denies any recent infections, allergies or URI. Patient "s visual fields have not changed significantly in recent time.    PHYSICAL EXAM: BP (!) 155/74 (BP Location: Left Arm)   Pulse 69   Temp (!) 97.1 F (36.2 C) (Tympanic)   Resp 16   Wt 156 lb 8.4 oz (71 kg)   BMI 30.57 kg/m  Eft breast is wide local excision which is healing well some slightgranulation tissue is noted. No other dominant mass or nodularity is noted in either breast in 2 positions examined. No axillary or supraclavicular adenopathy is identified.Well-developed well-nourished patient in NAD. HEENT reveals PERLA, EOMI, discs not visualized.  Oral cavity is clear. No oral mucosal lesions are identified. Neck is clear without evidence of cervical or supraclavicular adenopathy. Lungs are clear to A&P. Cardiac examination is essentially unremarkable with regular rate and rhythm without murmur rub or thrill. Abdomen is benign with no organomegaly or masses noted. Motor sensory and DTR levels are equal and symmetric in the upper and lower extremities. Cranial nerves II through XII are grossly intact. Proprioception is intact. No peripheral adenopathy or edema is identified. No motor or sensory levels are noted. Crude visual fields are within normal range.  LABORATORY DATA: pathology reports reviewed    RADIOLOGY RESULTS:mammogram and ultrasound reviewed   IMPRESSION: probable stage I invasive mammary carcinoma the left breast status post wide local  excision ER/PR positive pending sentinel node biopsyof the left axilla in 85 year old female  PLAN: at this time we'll wait her sentinel node biopsy which may change her overall staging. Tentatively this time I would plan on left breast radiation possibly the hypofractionated regiment. Would also boost her scar another 1400 cGy using electron beam. Risks and benefits of treatment were reviewed with the patient including skin reaction fatigue alteration of blood counts possible  inclusion of superficial lung all were discussed in detail. I have set her up for follow-up in about 4 weeks at which time she'll have time to if healed and have her axillary sentinel nodes performed. Patient copy is my treatment plan well.  I would like to take this opportunity to thank you for allowing me to participate in the care of your patient.Noreene Filbert, MD

## 2018-02-10 ENCOUNTER — Other Ambulatory Visit: Payer: Self-pay

## 2018-02-10 DIAGNOSIS — C50412 Malignant neoplasm of upper-outer quadrant of left female breast: Secondary | ICD-10-CM

## 2018-02-10 NOTE — Telephone Encounter (Signed)
Notified patient that she will report to the Radiology desk in the [place] on 02/16/18 at 11:00 am for her Sentinal node injection prior to surgery.

## 2018-02-14 ENCOUNTER — Other Ambulatory Visit: Payer: Self-pay | Admitting: *Deleted

## 2018-02-14 DIAGNOSIS — C50412 Malignant neoplasm of upper-outer quadrant of left female breast: Secondary | ICD-10-CM

## 2018-02-16 ENCOUNTER — Emergency Department (EMERGENCY_DEPARTMENT_HOSPITAL)
Admission: EM | Admit: 2018-02-16 | Discharge: 2018-02-16 | Disposition: A | Discharge status: Home / Self Care | Payer: Medicare Other | Source: Home / Self Care | Attending: Emergency Medicine | Admitting: Emergency Medicine

## 2018-02-16 ENCOUNTER — Encounter: Payer: Self-pay | Admitting: *Deleted

## 2018-02-16 ENCOUNTER — Encounter: Payer: Self-pay | Admitting: Emergency Medicine

## 2018-02-16 ENCOUNTER — Other Ambulatory Visit: Payer: Self-pay

## 2018-02-16 ENCOUNTER — Ambulatory Visit
Admission: RE | Admit: 2018-02-16 | Discharge: 2018-02-16 | Disposition: A | Discharge status: Home / Self Care | Payer: Medicare Other | Source: Ambulatory Visit | Attending: Surgery | Admitting: Surgery

## 2018-02-16 ENCOUNTER — Encounter
Admission: RE | Disposition: A | Discharge status: Other Acute Inpatient Hospital | Payer: Self-pay | Source: Home / Self Care | Attending: Surgery

## 2018-02-16 ENCOUNTER — Ambulatory Visit
Admission: RE | Admit: 2018-02-16 | Discharge: 2018-02-16 | Payer: Medicare Other | Attending: Surgery | Admitting: Surgery

## 2018-02-16 ENCOUNTER — Ambulatory Visit: Payer: Medicare Other | Admitting: Anesthesiology

## 2018-02-16 ENCOUNTER — Emergency Department: Payer: Medicare Other

## 2018-02-16 DIAGNOSIS — R109 Unspecified abdominal pain: Secondary | ICD-10-CM | POA: Diagnosis not present

## 2018-02-16 DIAGNOSIS — Z5309 Procedure and treatment not carried out because of other contraindication: Secondary | ICD-10-CM | POA: Insufficient documentation

## 2018-02-16 DIAGNOSIS — Z7982 Long term (current) use of aspirin: Secondary | ICD-10-CM

## 2018-02-16 DIAGNOSIS — N189 Chronic kidney disease, unspecified: Secondary | ICD-10-CM | POA: Insufficient documentation

## 2018-02-16 DIAGNOSIS — C50412 Malignant neoplasm of upper-outer quadrant of left female breast: Secondary | ICD-10-CM

## 2018-02-16 DIAGNOSIS — K529 Noninfective gastroenteritis and colitis, unspecified: Secondary | ICD-10-CM | POA: Insufficient documentation

## 2018-02-16 DIAGNOSIS — Z79899 Other long term (current) drug therapy: Secondary | ICD-10-CM

## 2018-02-16 DIAGNOSIS — E119 Type 2 diabetes mellitus without complications: Secondary | ICD-10-CM | POA: Insufficient documentation

## 2018-02-16 DIAGNOSIS — I129 Hypertensive chronic kidney disease with stage 1 through stage 4 chronic kidney disease, or unspecified chronic kidney disease: Secondary | ICD-10-CM | POA: Insufficient documentation

## 2018-02-16 DIAGNOSIS — C50912 Malignant neoplasm of unspecified site of left female breast: Secondary | ICD-10-CM | POA: Diagnosis not present

## 2018-02-16 DIAGNOSIS — R1032 Left lower quadrant pain: Secondary | ICD-10-CM | POA: Diagnosis not present

## 2018-02-16 DIAGNOSIS — R1012 Left upper quadrant pain: Secondary | ICD-10-CM | POA: Diagnosis not present

## 2018-02-16 DIAGNOSIS — I10 Essential (primary) hypertension: Secondary | ICD-10-CM | POA: Diagnosis not present

## 2018-02-16 LAB — COMPREHENSIVE METABOLIC PANEL
ALT: 10 U/L (ref 0–44)
AST: 20 U/L (ref 15–41)
Albumin: 3.3 g/dL — ABNORMAL LOW (ref 3.5–5.0)
Alkaline Phosphatase: 59 U/L (ref 38–126)
Anion gap: 11 (ref 5–15)
BUN: 25 mg/dL — ABNORMAL HIGH (ref 8–23)
CO2: 26 mmol/L (ref 22–32)
Calcium: 9.4 mg/dL (ref 8.9–10.3)
Chloride: 102 mmol/L (ref 98–111)
Creatinine, Ser: 1.11 mg/dL — ABNORMAL HIGH (ref 0.44–1.00)
GFR calc Af Amer: 52 mL/min — ABNORMAL LOW (ref >60)
GFR calc non Af Amer: 45 mL/min — ABNORMAL LOW (ref >60)
GLUCOSE: 95 mg/dL (ref 70–99)
Potassium: 4.1 mmol/L (ref 3.5–5.1)
Sodium: 139 mmol/L (ref 135–145)
Total Bilirubin: 0.7 mg/dL (ref 0.3–1.2)
Total Protein: 8 g/dL (ref 6.5–8.1)

## 2018-02-16 LAB — URINALYSIS, COMPLETE (UACMP) WITH MICROSCOPIC
Bilirubin Urine: NEGATIVE
Glucose, UA: NEGATIVE mg/dL
Hgb urine dipstick: NEGATIVE
Ketones, ur: NEGATIVE mg/dL
Leukocytes, UA: NEGATIVE
Nitrite: NEGATIVE
Protein, ur: NEGATIVE mg/dL
Specific Gravity, Urine: 1.014 (ref 1.005–1.030)
pH: 5 (ref 5.0–8.0)

## 2018-02-16 LAB — CBC
HCT: 36.4 % (ref 36.0–46.0)
Hemoglobin: 11.4 g/dL — ABNORMAL LOW (ref 12.0–15.0)
MCH: 26 pg (ref 26.0–34.0)
MCHC: 31.3 g/dL (ref 30.0–36.0)
MCV: 82.9 fL (ref 80.0–100.0)
Platelets: 375 10*3/uL (ref 150–400)
RBC: 4.39 MIL/uL (ref 3.87–5.11)
RDW: 15.6 % — ABNORMAL HIGH (ref 11.5–15.5)
WBC: 7.3 10*3/uL (ref 4.0–10.5)
nRBC: 0 % (ref 0.0–0.2)

## 2018-02-16 LAB — LIPASE, BLOOD: Lipase: 27 U/L (ref 11–51)

## 2018-02-16 LAB — GLUCOSE, CAPILLARY: Glucose-Capillary: 80 mg/dL (ref 70–99)

## 2018-02-16 SURGERY — BIOPSY, LYMPH NODE, SENTINEL
Anesthesia: General | Laterality: Left

## 2018-02-16 MED ORDER — FENTANYL CITRATE (PF) 100 MCG/2ML IJ SOLN
50.0000 ug | INTRAMUSCULAR | Status: AC | PRN
  Administered 2018-02-16 (×2): 50 ug via INTRAVENOUS
  Filled 2018-02-16: qty 2

## 2018-02-16 MED ORDER — IOHEXOL 300 MG/ML  SOLN
100.0000 mL | Freq: Once | INTRAMUSCULAR | Status: AC | PRN
  Administered 2018-02-16: 100 mL via INTRAVENOUS

## 2018-02-16 MED ORDER — AMOXICILLIN-POT CLAVULANATE 875-125 MG PO TABS
1.0000 | ORAL_TABLET | Freq: Once | ORAL | Status: AC
  Administered 2018-02-16: 1 via ORAL
  Filled 2018-02-16: qty 1

## 2018-02-16 MED ORDER — CHLORHEXIDINE GLUCONATE CLOTH 2 % EX PADS: 6.0000 | MEDICATED_PAD | Freq: Once | CUTANEOUS | Status: DC

## 2018-02-16 MED ORDER — AMOXICILLIN-POT CLAVULANATE 875-125 MG PO TABS: 1.0000 | ORAL_TABLET | Freq: Two times a day (BID) | ORAL | 0 refills | Status: AC

## 2018-02-16 MED ORDER — FENTANYL CITRATE (PF) 100 MCG/2ML IJ SOLN
INTRAMUSCULAR | Status: AC
  Administered 2018-02-16: 50 ug via INTRAVENOUS
  Filled 2018-02-16: qty 2

## 2018-02-16 MED ORDER — TECHNETIUM TC 99M SULFUR COLLOID FILTERED
1.0000 | Freq: Once | INTRAVENOUS | Status: AC | PRN
  Administered 2018-02-16: 0.792 via INTRADERMAL

## 2018-02-16 MED ORDER — IOPAMIDOL (ISOVUE-300) INJECTION 61%
30.0000 mL | Freq: Once | INTRAVENOUS | Status: AC
  Administered 2018-02-16: 30 mL via ORAL

## 2018-02-16 MED ORDER — TRAMADOL HCL 50 MG PO TABS: 50.0000 mg | ORAL_TABLET | Freq: Four times a day (QID) | ORAL | 0 refills | Status: DC | PRN

## 2018-02-16 MED ORDER — ONDANSETRON HCL 4 MG/2ML IJ SOLN
INTRAMUSCULAR | Status: AC
  Administered 2018-02-16: 4 mg via INTRAVENOUS
  Filled 2018-02-16: qty 2

## 2018-02-16 MED ORDER — ONDANSETRON HCL 4 MG/2ML IJ SOLN
4.0000 mg | Freq: Once | INTRAMUSCULAR | Status: AC
  Administered 2018-02-16: 4 mg via INTRAVENOUS

## 2018-02-16 MED ORDER — SODIUM CHLORIDE 0.9 % IV SOLN
INTRAVENOUS | Status: DC
  Administered 2018-02-16: 13:00:00 via INTRAVENOUS

## 2018-02-16 MED ORDER — FENTANYL CITRATE (PF) 100 MCG/2ML IJ SOLN
INTRAMUSCULAR | Status: AC
  Filled 2018-02-16: qty 2

## 2018-02-16 SURGICAL SUPPLY — 19 items
APPLIER CLIP 11 MED OPEN (CLIP) ×3
APPLIER CLIP 9.375 SM OPEN (CLIP) ×3
CANISTER SUCT 1200ML W/VALVE (MISCELLANEOUS) ×3 IMPLANT
CHLORAPREP W/TINT 26ML (MISCELLANEOUS) ×3 IMPLANT
CLIP APPLIE 11 MED OPEN (CLIP) ×1 IMPLANT
CLIP APPLIE 9.375 SM OPEN (CLIP) ×1 IMPLANT
COVER PROBE FLX POLY STRL (MISCELLANEOUS) ×3 IMPLANT
COVER WAND RF STERILE (DRAPES) ×3 IMPLANT
DRAPE LAPAROTOMY 100X77 ABD (DRAPES) ×3 IMPLANT
ELECT REM PT RETURN 9FT ADLT (ELECTROSURGICAL) ×3
ELECTRODE REM PT RTRN 9FT ADLT (ELECTROSURGICAL) ×1 IMPLANT
GAUZE SPONGE 4X4 12PLY STRL (GAUZE/BANDAGES/DRESSINGS) ×3 IMPLANT
GLOVE BIO SURGEON STRL SZ7 (GLOVE) ×3 IMPLANT
GLOVE INDICATOR 7.5 STRL GRN (GLOVE) ×3 IMPLANT
GOWN STRL REUS W/ TWL LRG LVL3 (GOWN DISPOSABLE) ×1 IMPLANT
GOWN STRL REUS W/TWL LRG LVL3 (GOWN DISPOSABLE) ×2
NEEDLE HYPO 22GX1.5 SAFETY (NEEDLE) ×3 IMPLANT
PACK BASIN MINOR ARMC (MISCELLANEOUS) ×3 IMPLANT
SPONGE LAP 18X18 RF (DISPOSABLE) ×3 IMPLANT

## 2018-02-16 NOTE — Anesthesia Preprocedure Evaluation (Deleted)
Anesthesia Evaluation  Patient identified by MRN, date of birth, ID band Patient awake    Reviewed: Allergy & Precautions, NPO status , Patient's Chart, lab work & pertinent test results  History of Anesthesia Complications Negative for: history of anesthetic complications  Airway Mallampati: III  TM Distance: >3 FB Neck ROM: Full    Dental  (+) Upper Dentures, Lower Dentures   Pulmonary neg pulmonary ROS, neg sleep apnea, neg COPD,    breath sounds clear to auscultation- rhonchi (-) wheezing      Cardiovascular hypertension, Pt. on medications (-) CAD, (-) Past MI, (-) Cardiac Stents and (-) CABG  Rhythm:Regular Rate:Normal - Systolic murmurs and - Diastolic murmurs Echo 4/97/02: HYPERCONTRACTILE LV FUNCTION  WITH MILD LVH NORMAL RIGHT VENTRICULAR SYSTOLIC FUNCTION MILD VALVULAR REGURGITATION (mild AI, trivial MR) NO VALVULAR STENOSIS NO PERICARDIAL EFFUSION GRADE 1 DIASTOLIC DYSFUNCTION AORTIC VALVE SCLEROSIS WITHOUT STENOSIS ESTIMATED RVSP = 30 MMHG   Neuro/Psych  Headaches, neg Seizures negative psych ROS   GI/Hepatic negative GI ROS, Neg liver ROS,   Endo/Other  diabetes  Renal/GU Renal InsufficiencyRenal disease     Musculoskeletal  (+) Arthritis ,   Abdominal (+) + obese,   Peds  Hematology negative hematology ROS (+)   Anesthesia Other Findings Past Medical History: No date: Arthritis 01/2018: Cancer ([place])     Comment:  left breast  No date: Cataract No date: Chronic headaches No date: CKD (chronic kidney disease) No date: Diabetes mellitus without complication ([facility])     Comment:  no longer taking metformin; diet controlled now No date: Diverticulitis No date: History of syncope No date: Hypertension No date: Multinodular goiter No date: Primary osteoarthritis of left knee No date: Pulmonary hypertension ([facility])   Reproductive/Obstetrics                              Anesthesia Physical Anesthesia Plan  ASA: III  Anesthesia Plan: General   Post-op Pain Management:    Induction: Intravenous  PONV Risk Score and Plan: 2 and Ondansetron and Dexamethasone  Airway Management Planned: LMA  Additional Equipment:   Intra-op Plan:   Post-operative Plan:   Informed Consent: I have reviewed the patients History and Physical, chart, labs and discussed the procedure including the risks, benefits and alternatives for the proposed anesthesia with the patient or authorized representative who has indicated his/her understanding and acceptance.     Dental advisory given  Plan Discussed with: CRNA and Anesthesiologist  Anesthesia Plan Comments:         Anesthesia Quick Evaluation

## 2018-02-16 NOTE — Progress Notes (Signed)
Surgery cancelled per Dr Dahlia Byes due to pt complaint of strong abd pain.  Pt transferred to the ER waiting area via w/c and registered, daughter at side. Report to Triage nurse, and IV with SL left intact in right upper forearm due to pt being a difficult stick.

## 2018-02-16 NOTE — ED Notes (Signed)
Lactic taken and resulted to 1.05, MD notified

## 2018-02-16 NOTE — ED Provider Notes (Addendum)
[facility] Emergency Department Provider Note  Time seen: 4:58 PM  I have reviewed the triage vital signs and the nursing notes.   HISTORY  Chief Complaint Abdominal Pain   HPI Melonie Germani is a 85 y.o. female with a past medical history of arthritis, breast cancer, CKD, hypertension, presents to the emergency department for left-sided abdominal pain.  According to the patient she was scheduled for a left breast node biopsy today.  Patient told the surgeon of her left-sided abdominal pain which has been progressively worsening over the past 3 days and she was sent to the ER instead for further evaluation.  Patient denies any fever, denies any nausea or vomiting.  States normal bowel movements including this morning.  Patient states she had a colostomy that was reversed in 2008, the pain is in the area where the colostomy used to be per patient.  Describes mostly left upper quadrant left lower quadrant pain.   Past Medical History:  Diagnosis Date  . Arthritis   . Cancer ([place]) 01/2018   left breast   . Cataract   . Chronic headaches   . CKD (chronic kidney disease)   . Diabetes mellitus without complication ([place])    no longer taking metformin; diet controlled now  . Diverticulitis   . History of syncope   . Hypertension   . Multinodular goiter   . Primary osteoarthritis of left knee   . Pulmonary hypertension [facility])     Patient Active Problem List   Diagnosis Date Noted  . Primary cancer of upper outer quadrant of left female breast ([place]) 01/08/2018  . Type 2 diabetes mellitus with diabetic nephropathy, without long-term current use of insulin ([place]) 11/10/2017  . Chronic headaches 11/10/2017  . Hyperlipidemia associated with type 2 diabetes mellitus ([place]) 11/10/2017  . Essential hypertension 11/10/2017  . Enteritis 09/18/2017    Past Surgical History:  Procedure Laterality Date  . ABDOMINAL HYSTERECTOMY    . APPENDECTOMY    . BREAST BIOPSY Left  01/03/2018   left breast stereo/x clip/path pending  . BREAST LUMPECTOMY Left 01/12/2018  . BREAST LUMPECTOMY WITH NEEDLE LOCALIZATION Left 01/12/2018   Procedure: BREAST LUMPECTOMY WITH NEEDLE LOCALIZATION;  Surgeon: Jules Husbands, MD;  Location: [facility] ORS;  Service: General;  Laterality: Left;  . CHOLECYSTECTOMY    . COLON SURGERY    . COLOSTOMY  2008  . COLOSTOMY REVERSAL  2008  . EYE SURGERY    . REPLACEMENT TOTAL KNEE Right 2018    Prior to Admission medications   Medication Sig Start Date End Date Taking? Authorizing Provider  acetaminophen (TYLENOL) 500 MG tablet Take 1,000 mg by mouth every 6 (six) hours as needed for moderate pain or headache.     [provider]  amLODipine (NORVASC) 5 MG tablet Take 5 mg by mouth daily.  07/07/16   [provider]  aspirin 81 MG chewable tablet Chew 81 mg by mouth daily.     [provider]  cloNIDine (CATAPRES) 0.1 MG tablet TAKE 1 TABLET BY MOUTH DAILY AS NEEDED FOR ELEVATED BLOOD PRESSURE GREATER THAN 180/100 Patient taking differently: Take 0.1 mg by mouth daily as needed (for BP > 180/100).  11/10/17   Karamalegos, Devonne Doughty, DO  gabapentin (NEURONTIN) 100 MG capsule Start 1 capsule daily, increase by 1 cap every 1 week as tolerated, next add 1 in evening, up to max 3 pills in evening Patient taking differently: Take 100 mg by mouth daily.  11/10/17  Karamalegos, Devonne Doughty, DO  HYDROcodone-acetaminophen (NORCO/VICODIN) 5-325 MG tablet Take 1 tablet by mouth every 6 (six) hours as needed for moderate pain. Patient not taking: Reported on 02/14/2018 01/12/18   Caroleen Hamman F, MD  lisinopril-hydrochlorothiazide (PRINZIDE,ZESTORETIC) 20-12.5 MG tablet Take 1 tablet by mouth daily. 12/09/17   Mikey College, NP  pravastatin (PRAVACHOL) 20 MG tablet Take 20 mg by mouth at bedtime.  07/28/17   [provider]    Allergies  Allergen Reactions  . Codeine Nausea Only  . Morphine And Related Nausea Only     Family History  Problem Relation Age of Onset  . Lung cancer Mother   . Diabetes Father   . Stroke Father   . Hypertension Maternal Grandmother   . Hypertension Maternal Uncle   . Hypertension Maternal Aunt   . Ovarian cancer Maternal Aunt   . Heart disease Daughter     Social History Social History   Tobacco Use  . Smoking status: Never Smoker  . Smokeless tobacco: Never Used  Substance Use Topics  . Alcohol use: Never    Frequency: Never  . Drug use: Never    Review of Systems Constitutional: Negative for fever. Cardiovascular: Negative for chest pain. Respiratory: Negative for shortness of breath. Gastrointestinal: Left-sided abdominal pain.  Negative for nausea vomiting or diarrhea.  Negative constipation. Genitourinary: Negative for urinary compaints Musculoskeletal: Negative for musculoskeletal complaints Skin: Negative for skin complaints  Neurological: Negative for headache All other ROS negative  ____________________________________________   PHYSICAL EXAM:  VITAL SIGNS: ED Triage Vitals [02/16/18 1307]  Enc Vitals Group     BP (!) 162/72     Pulse Rate 77     Resp 18     Temp 98 F (36.7 C)     Temp Source Oral     SpO2 98 %     Weight 156 lb (70.8 kg)     Height 5\' 2"  (1.575 m)     Head Circumference      Peak Flow      Pain Score 10     Pain Loc      Pain Edu?      Excl. in [place]?    Constitutional: Alert and oriented. Well appearing and in no distress. Eyes: Normal exam ENT   Head: Normocephalic and atraumatic.   Mouth/Throat: Mucous membranes are moist. Cardiovascular: Normal rate, regular rhythm.  Respiratory: Normal respiratory effort without tachypnea nor retractions. Breath sounds are clear Gastrointestinal: Soft, moderate left-sided abdominal tenderness.  No rebound guarding or distention. Musculoskeletal: Nontender with normal range of motion in all extremities.  Neurologic:  Normal speech and language. No gross focal  neurologic deficits  Skin:  Skin is warm, dry and intact.  Psychiatric: Mood and affect are normal.   ____________________________________________     RADIOLOGY  IMPRESSION: Multiple thickened small bowel loops in the LEFT mid abdomen compatible with enteritis, associated with mesenteric edema and small amount of free fluid; differential diagnosis includes infectious enteritis, inflammatory bowel disease, and ischemia.  No evidence of perforation or abscess.  11 x 9 x 11 mm potentially enhancing intermediate attenuation LEFT renal nodule, cannot exclude neoplasm; this could be best characterized by MRI.  No other acute intra-abdominal or intrapelvic findings.  Findings called to Dr. Kerman Passey on 02/16/2018 at 1923 hrs.  ____________________________________________   INITIAL IMPRESSION / ASSESSMENT AND PLAN / ED COURSE  Pertinent labs & imaging results that were available during my care of the patient were reviewed by me  and considered in my medical decision making (see chart for details).  Patient presents emergency department for left-sided abdominal pain, moderate tenderness on exam.  Differential would include colitis, diverticulitis, SBO, gastritis, gastroenteritis, UTI/pyelonephritis.  We will check labs, urinalysis and obtain CT imaging given the moderate tenderness on examination.  Patient's lab work is largely within normal limits, urinalysis pending.   CT scan most consistent with a small bowel enteritis.  Patient has been seen by general surgery Dr. Hampton Abbot in the emergency department.  Recommends obtaining a lactic acid if lactic acid is normal they will follow-up with the patient as an outpatient for her procedure at a later date.  Patient's lactate is normal.  We will discharge on a course of antibiotics to cover for possible bacterial infection.  Patient will follow-up with general surgery for further evaluation.  EKG viewed and interpreted by myself shows  normal sinus rhythm at 67 bpm with a narrow QRS, normal axis, normal intervals, no concerning ST changes. ____________________________________________   FINAL CLINICAL IMPRESSION(S) / ED DIAGNOSES  Abdominal pain Enteritis   Harvest Dark, MD 02/16/18 2111    Harvest Dark, MD 03/12/18 [phone]

## 2018-02-16 NOTE — Consult Note (Signed)
Date of Consultation:  02/16/2018  Requesting Physician:  Harvest Dark, MD  Reason for Consultation:  Abdominal pain  History of Present Illness: Ellen [name] is a 85 y.o. female s/p recent left breast wire-localized lumpectomy.  Her pathology got upgraded from DCIS on biopsy to invasive breast cancer on final pathology, and she was scheduled today for sentinel node biopsy.  In Preop, she reported a 4 day history of abdominal pain, described as 20 out of 10.  Her surgery was canceled and she was sent to the ED for further evaluation.  She reports that the pain is on the left side and it has been hurting constantly and had been getting worse.  Denies any nausea or vomiting, fevers or chills, chest pain or shortness of breath.  Denies any constipation and has been having normal daily bowel movements without any blood.  The pain does not radiate.  She has been tolerating a regular diet without issues, and food does not make the pain worse.  In the ED she had workup including labs and CT scan.  I have independently viewed the patient's CT scan and agree with the read.  Overall she has a segment of small bowel loops on the left side which are thickened with associated mesenteric stranding.  There is no abscess or free air.  Her labs are overall unremarkable with normal lactic acid.  Past Medical History: Past Medical History:  Diagnosis Date  . Arthritis   . Cancer ([place]) 01/2018   left breast   . Cataract   . Chronic headaches   . CKD (chronic kidney disease)   . Diabetes mellitus without complication ([place])    no longer taking metformin; diet controlled now  . Diverticulitis   . History of syncope   . Hypertension   . Multinodular goiter   . Primary osteoarthritis of left knee   . Pulmonary hypertension ([place])      Past Surgical History: Past Surgical History:  Procedure Laterality Date  . ABDOMINAL HYSTERECTOMY    . APPENDECTOMY    . BREAST BIOPSY Left 01/03/2018   left breast  stereo/x clip/path pending  . BREAST LUMPECTOMY Left 01/12/2018  . BREAST LUMPECTOMY WITH NEEDLE LOCALIZATION Left 01/12/2018   Procedure: BREAST LUMPECTOMY WITH NEEDLE LOCALIZATION;  Surgeon: Jules Husbands, MD;  Location: [facility] ORS;  Service: General;  Laterality: Left;  . CHOLECYSTECTOMY    . COLON SURGERY    . COLOSTOMY  2008  . COLOSTOMY REVERSAL  2008  . EYE SURGERY    . REPLACEMENT TOTAL KNEE Right 2018    Home Medications: Prior to Admission medications   Medication Sig Start Date End Date Taking? Authorizing Provider  acetaminophen (TYLENOL) 500 MG tablet Take 1,000 mg by mouth every 6 (six) hours as needed for moderate pain or headache.     [provider]  amLODipine (NORVASC) 5 MG tablet Take 5 mg by mouth daily.  07/07/16   [provider]  amoxicillin-clavulanate (AUGMENTIN) 875-125 MG tablet Take 1 tablet by mouth 2 (two) times daily for 10 days. 02/16/18 02/26/18  Harvest Dark, MD  aspirin 81 MG chewable tablet Chew 81 mg by mouth daily.     [provider]  cloNIDine (CATAPRES) 0.1 MG tablet TAKE 1 TABLET BY MOUTH DAILY AS NEEDED FOR ELEVATED BLOOD PRESSURE GREATER THAN 180/100 Patient taking differently: Take 0.1 mg by mouth daily as needed (for BP > 180/100).  11/10/17   Karamalegos, Devonne Doughty, DO  gabapentin (NEURONTIN) 100 MG capsule  Start 1 capsule daily, increase by 1 cap every 1 week as tolerated, next add 1 in evening, up to max 3 pills in evening Patient taking differently: Take 100 mg by mouth daily.  11/10/17   Karamalegos, Devonne Doughty, DO  HYDROcodone-acetaminophen (NORCO/VICODIN) 5-325 MG tablet Take 1 tablet by mouth every 6 (six) hours as needed for moderate pain. Patient not taking: Reported on 02/14/2018 01/12/18   Caroleen Hamman F, MD  lisinopril-hydrochlorothiazide (PRINZIDE,ZESTORETIC) 20-12.5 MG tablet Take 1 tablet by mouth daily. 12/09/17   Mikey College, NP  pravastatin (PRAVACHOL) 20 MG tablet Take 20 mg by mouth at  bedtime.  07/28/17   [provider]  traMADol (ULTRAM) 50 MG tablet Take 1 tablet (50 mg total) by mouth every 6 (six) hours as needed. 02/16/18   Harvest Dark, MD    Allergies: Allergies  Allergen Reactions  . Codeine Nausea Only  . Morphine And Related Nausea Only    Social History:  reports that she has never smoked. She has never used smokeless tobacco. She reports that she does not drink alcohol or use drugs.   Family History: Family History  Problem Relation Age of Onset  . Lung cancer Mother   . Diabetes Father   . Stroke Father   . Hypertension Maternal Grandmother   . Hypertension Maternal Uncle   . Hypertension Maternal Aunt   . Ovarian cancer Maternal Aunt   . Heart disease Daughter     Review of Systems: Review of Systems  Constitutional: Negative for chills and fever.  HENT: Negative for hearing loss.   Respiratory: Negative for shortness of breath.   Cardiovascular: Negative for chest pain.  Gastrointestinal: Positive for abdominal pain. Negative for blood in stool, constipation, diarrhea, nausea and vomiting.  Genitourinary: Negative for dysuria.  Musculoskeletal: Negative for myalgias.  Skin: Negative for rash.  Neurological: Negative for dizziness.  Psychiatric/Behavioral: Negative for depression.    Physical Exam BP (!) 149/74 (BP Location: Right Arm)   Pulse 80   Temp 98.5 F (36.9 C) (Oral)   Resp 14   Ht 5\' 2"  (1.575 m)   Wt 70.8 kg   SpO2 93%   BMI 28.53 kg/m  CONSTITUTIONAL: No acute distress HEENT:  Normocephalic, atraumatic, extraocular motion intact. NECK: Trachea is midline, and there is no jugular venous distension. RESPIRATORY:  Lungs are clear, and breath sounds are equal bilaterally. Normal respiratory effort without pathologic use of accessory muscles. CARDIOVASCULAR: Heart is regular without murmurs, gallops, or rubs. GI: The abdomen is soft, non-distended, with tenderness to palpation in the left mid abdomen.   There is no diffuse peritonitis.  Prior scars from exploratory laparotomy with end colostomy and reversal are well healed.  MUSCULOSKELETAL:  Normal muscle strength and tone in all four extremities.  No peripheral edema or cyanosis. SKIN: Skin turgor is normal. There are no pathologic skin lesions.  NEUROLOGIC:  Motor and sensation is grossly normal.  Cranial nerves are grossly intact. PSYCH:  Alert and oriented to person, place and time. Affect is normal.  Laboratory Analysis: Results for orders placed or performed during the hospital encounter of 02/16/18 (from the past 24 hour(s))  Lipase, blood     Status: None   Collection Time: 02/16/18  1:09 PM  Result Value Ref Range   Lipase 27 11 - 51 U/L  Comprehensive metabolic panel     Status: Abnormal   Collection Time: 02/16/18  1:09 PM  Result Value Ref Range   Sodium 139 135 -  145 mmol/L   Potassium 4.1 3.5 - 5.1 mmol/L   Chloride 102 98 - 111 mmol/L   CO2 26 22 - 32 mmol/L   Glucose, Bld 95 70 - 99 mg/dL   BUN 25 (H) 8 - 23 mg/dL   Creatinine, Ser 1.11 (H) 0.44 - 1.00 mg/dL   Calcium 9.4 8.9 - 10.3 mg/dL   Total Protein 8.0 6.5 - 8.1 g/dL   Albumin 3.3 (L) 3.5 - 5.0 g/dL   AST 20 15 - 41 U/L   ALT 10 0 - 44 U/L   Alkaline Phosphatase 59 38 - 126 U/L   Total Bilirubin 0.7 0.3 - 1.2 mg/dL   GFR calc non Af Amer 45 (L) >60 mL/min   GFR calc Af Amer 52 (L) >60 mL/min   Anion gap 11 5 - 15  CBC     Status: Abnormal   Collection Time: 02/16/18  1:09 PM  Result Value Ref Range   WBC 7.3 4.0 - 10.5 K/uL   RBC 4.39 3.87 - 5.11 MIL/uL   Hemoglobin 11.4 (L) 12.0 - 15.0 g/dL   HCT 36.4 36.0 - 46.0 %   MCV 82.9 80.0 - 100.0 fL   MCH 26.0 26.0 - 34.0 pg   MCHC 31.3 30.0 - 36.0 g/dL   RDW 15.6 (H) 11.5 - 15.5 %   Platelets 375 150 - 400 K/uL   nRBC 0.0 0.0 - 0.2 %  Urinalysis, Complete w Microscopic     Status: Abnormal   Collection Time: 02/16/18  5:38 PM  Result Value Ref Range   Color, Urine YELLOW (A) YELLOW   APPearance CLEAR  (A) CLEAR   Specific Gravity, Urine 1.014 1.005 - 1.030   pH 5.0 5.0 - 8.0   Glucose, UA NEGATIVE NEGATIVE mg/dL   Hgb urine dipstick NEGATIVE NEGATIVE   Bilirubin Urine NEGATIVE NEGATIVE   Ketones, ur NEGATIVE NEGATIVE mg/dL   Protein, ur NEGATIVE NEGATIVE mg/dL   Nitrite NEGATIVE NEGATIVE   Leukocytes, UA NEGATIVE NEGATIVE   RBC / HPF 0-5 0 - 5 RBC/hpf   WBC, UA 0-5 0 - 5 WBC/hpf   Bacteria, UA RARE (A) NONE SEEN   Squamous Epithelial / LPF 6-10 0 - 5   Mucus PRESENT     Imaging: Ct Abdomen Pelvis W Contrast  Result Date: 02/16/2018 CLINICAL DATA:  Abdominal pain, primarily LEFT upper quadrant, acute onset today, history hypertension, type II diabetes mellitus, colostomy and reversal, pulmonary hypertension, LEFT breast cancer EXAM: CT ABDOMEN AND PELVIS WITH CONTRAST TECHNIQUE: Multidetector CT imaging of the abdomen and pelvis was performed using the standard protocol following bolus administration of intravenous contrast. Sagittal and coronal MPR images reconstructed from axial data set. CONTRAST:  146mL OMNIPAQUE IOHEXOL 300 MG/ML SOLN IV. Dilute oral contrast. COMPARISON:  09/18/2017 FINDINGS: Lower chest: Subsegmental atelectasis at BILATERAL lung bases Hepatobiliary: Gallbladder surgically absent. Tiny calcified hepatic granulomata. Liver otherwise unremarkable. Pancreas: Atrophic, otherwise unremarkable Spleen: Spleen surgically absent. Probable small splenule adjacent to stomach laterally. Adrenals/Urinary Tract: Adrenal glands normal appearance. Mild LEFT renal cortical atrophy. RIGHT kidney unremarkable. 11 x 9 x 11 mm anterior mid LEFT renal nodule, intermediate attenuation, cannot exclude neoplasm. No additional renal mass or hydronephrosis. Stomach/Bowel: Stomach unremarkable. Prior takedown and resection of LEFT colon. No definite residual colonic abnormalities. Multiple loops of thickened small bowel in the LEFT mid abdomen with associated mesenteric edema compatible with  enteritis. No definite evidence of obstruction or perforation. Surgically absent by history. Vascular/Lymphatic: Atherosclerotic calcifications aorta without  aneurysm. No adenopathy. Reproductive: Uterus surgically absent. Probable atrophic ovaries bilaterally. Other: Minimal free fluid in the LEFT mid abdomen. No definite free air or hernia. Musculoskeletal: Bones demineralized. IMPRESSION: Multiple thickened small bowel loops in the LEFT mid abdomen compatible with enteritis, associated with mesenteric edema and small amount of free fluid; differential diagnosis includes infectious enteritis, inflammatory bowel disease, and ischemia. No evidence of perforation or abscess. 11 x 9 x 11 mm potentially enhancing intermediate attenuation LEFT renal nodule, cannot exclude neoplasm; this could be best characterized by MRI. No other acute intra-abdominal or intrapelvic findings. Findings called to Dr. Kerman Passey on 02/16/2018 at 1923 hrs. Electronically Signed   By: Lavonia Dana M.D.   On: 02/16/2018 19:17   Nm Sentinel Node Injection  Result Date: 02/16/2018 CLINICAL DATA:  Left breast cancer. EXAM: NUCLEAR MEDICINE BREAST LYMPHOSCINTIGRAPHY TECHNIQUE: Intradermal injection of radiopharmaceutical was performed at the 12 o'clock, 3 o'clock, 6 o'clock, and 9 o'clock positions around the left nipple. The patient was then sent to the operating room where the sentinel node(s) were identified and removed by the surgeon. RADIOPHARMACEUTICALS:  Total of 1 mCi Millipore-filtered Technetium-33m sulfur colloid, injected in four aliquots of 0.25 mCi each. IMPRESSION: Uncomplicated intradermal injection of a total of 1 mCi Technetium-2m sulfur colloid for purposes of sentinel node identification. Electronically Signed   By: Aletta Edouard M.D.   On: 02/16/2018 14:06    Assessment and Plan: This is a 85 y.o. female with 4 day history of abdominal pain.  Discussed with the patient that her labs are overall normal with a  normal WBC and on POC, normal lactic acid of 1.05.  Her CT scan does show a segment of small bowel wall thickening, which is consistent with enteritis.  Though ischemia is on the differential, her lactic acid is normal and I would suspect a much worse clinical picture if ischemia had been ongoing for 4 days.  Discussed with Dr. Kerman Passey that if her pain is controlled, she may be discharged to home with follow up with Dr. Dahlia Byes so he can re-schedule her for her sentinel node biopsy.  Otherwise, she may be admitted to medicine if need be for pain control.  No indication for any surgical intervention at this time.  Patient understands this plan and all of her questions have been answered.     Melvyn Neth, MD [facility]:  [phone]

## 2018-02-16 NOTE — Progress Notes (Signed)
Pt seen and examined Again. She now discloses that she has been having severe abdominal pain 20 out of 10 for the last 4 days , no fever or chills. She initially did not say anything because she did not want to have her surgery cancelled.  SHe is in NAD Abd: soft but moderately tender to palpation RLQ and epigastric area.  A/p Acute abdominal pain concerning for potential perforated ulcer, diverticulitis, enteritis.  She will need appropriate w/u in the ER. The safest thing to do is to postpone the surgery. She insisted that she wanted to proceed but I explained to her that the SLNbx is purely elective and given her acute illness we needed to r/o acute abdominal pathology . Extensive counseling provided to the pt and family. We will send her to ER triage d/w ER Charge nurse in detail

## 2018-02-16 NOTE — ED Notes (Signed)
Patient verbalized understanding of discharge instructions, no questions. Patient out of ED in wheelchair in no distress.

## 2018-02-16 NOTE — Progress Notes (Signed)
Pt complains of left upper abdominal pain rates it a "20".  States pain started 4 days ago.  When asked why she didn't tell anyone she states because she didn't want to have her surgery cancelled.  Dr Dahlia Byes notified.

## 2018-02-16 NOTE — Discharge Instructions (Signed)
You have been seen in the emergency department for abdominal pain.  Your CT scan is most consistent with enteritis a infection of the small bowel.  Please take your antibiotic as prescribed for its entire course.  Return to the emergency department for any worsening abdominal pain, development of fever, vomiting unable to keep down your antibiotics, or any other symptom personally concerning to yourself.

## 2018-02-16 NOTE — ED Triage Notes (Signed)
Pt reports was getting ready to have a procedure done and she started complaining of pain to her abd, left upper quad. Pt reports they immediately brought her to the ED.

## 2018-02-16 NOTE — Interval H&P Note (Signed)
History and Physical Interval Note:  02/16/2018 11:57 AM  Ellen [name]  has presented today for surgery, with the diagnosis of BREAST CANCER  The various methods of treatment have been discussed with the patient and family. After consideration of risks, benefits and other options for treatment, the patient has consented to  Procedure(s): SENTINEL NODE BIOPSY (Left) as a surgical intervention .  The patient's history has been reviewed, patient examined, no change in status, stable for surgery.  I have reviewed the patient's chart and labs.  Questions were answered to the patient's satisfaction.     [place]

## 2018-02-16 NOTE — ED Notes (Signed)
Patient transported to CT 

## 2018-02-16 NOTE — ED Notes (Signed)
Pt c/o of LLQ abd pain 9/10.

## 2018-02-16 NOTE — ED Notes (Signed)
Family at bedside. 

## 2018-02-16 NOTE — ED Notes (Signed)
CT has been notified. Pt drank 1.5 bottles of contrast. Pt threw up 300cc of contrast. Pt has been giving antiemetic. See MAR.

## 2018-02-17 LAB — CG4 I-STAT (LACTIC ACID): Lactic Acid, Venous: 1.05 mmol/L (ref 0.5–1.9)

## 2018-02-20 ENCOUNTER — Telehealth: Payer: Self-pay | Admitting: *Deleted

## 2018-02-20 ENCOUNTER — Other Ambulatory Visit: Payer: Self-pay | Admitting: *Deleted

## 2018-02-20 DIAGNOSIS — C50412 Malignant neoplasm of upper-outer quadrant of left female breast: Secondary | ICD-10-CM

## 2018-02-20 NOTE — Telephone Encounter (Signed)
Patient was contacted today in regards to rescheduling her surgery.   She states she was told to wait 2 weeks.  Patient wishes to get this rescheduled for 03-02-18. The patient is aware that we will be calling her with new arrival time day of surgery once this has been scheduled.

## 2018-02-20 NOTE — Telephone Encounter (Signed)
Patient aware that surgery was rescheduled for 03-02-18. The patient is aware to check in at the radiology desk (2nd desk on right in the [facility]) at 7:45 am.  The patient is aware to follow previous instructions given to her by the Pre-admission Testing Department.   Patient aware to call the office should she have further questions.  Note to Dr. Dahlia Byes to re-enter orders.

## 2018-02-20 NOTE — Telephone Encounter (Signed)
-----   Message from Jules Husbands, MD sent at 02/20/2018  9:48 AM EST ----- Selinda Eon,   She again has to be re-schedule for SLN bx axilla. Last time she had severe abd pain that needed to go to the ER ----- Message ----- From: Interface, Rad Results In Sent: 02/16/2018   2:09 PM EST To: Jules Husbands, MD

## 2018-02-21 ENCOUNTER — Telehealth: Payer: Self-pay

## 2018-02-21 ENCOUNTER — Other Ambulatory Visit: Payer: Self-pay

## 2018-02-21 NOTE — Telephone Encounter (Signed)
I spoke with the patient's daughter, Ellen [name], and she will need to have her surgery rescheduled from 03/02/18. She will be out of town and unable to take her. She will return on 03/05/18.  The patient's surgery has been rescheduled to 03/07/18 with Dr Dahlia Byes at [facility]. We will call he with her surgery arrival time and location.

## 2018-02-21 NOTE — Telephone Encounter (Signed)
Spoke with the patient and let her know that we have rescheduled her surgery to 03/07/18. She will report to the Radiology desk that morning at 7:45 am.  I have left a message with this information with her daughter, [name], and asked that she call back to confirm.

## 2018-02-21 NOTE — Patient Outreach (Signed)
[facility]) Care Management  02/21/2018  Ellen [name] 30-Nov-1929 [id number]   Referral Date: 02/20/2018 Referral Source: [facility] referral Referral Reason: needs assistance in the home  Outreach Attempt: spoke with patient. She is able to verify HIPAA.  Advised on reason for referral. Patient states that she is able to care for self and tries to do much as she can for herself.  She states that she is due to have surgery again at the end of the month.  Discussed [facility] services and how we could support.  Patient again reiterates that she can care for herself.  She states that she had the first surgery and was able to manage and wants to wait to see what happens with her second surgery.  CM verbalized understanding and offered letter and brochure for future reference.  Patient is agreeable and thankful for outreach.     Plan: RN CM will send letter and brochure and close case.    Jone Baseman, RN, MSN [facility] Care Management Care Management Coordinator Direct Line [phone] Toll Free: [phone]  Fax: [phone]

## 2018-02-22 ENCOUNTER — Telehealth: Payer: Self-pay | Admitting: *Deleted

## 2018-02-22 NOTE — Addendum Note (Signed)
Addended by: Caroleen Hamman F on: 02/22/2018 09:27 AM   Modules accepted: Orders, SmartSet

## 2018-02-22 NOTE — Telephone Encounter (Signed)
Patient contacted today and notified that arrival time day of surgery would be changing from 7:45 am to 8:45 am on 03-07-18 due to a change in Dr. Corlis Leak schedule.  Message left for patient's daughter, Ellen [name], to call the office in regards to the above.

## 2018-02-23 ENCOUNTER — Telehealth: Payer: Self-pay | Admitting: *Deleted

## 2018-02-23 NOTE — Telephone Encounter (Signed)
Tried to reach the patient's daughter again today but had to leave another message.   Detailed message regarding surgery arrival time change-new time is 8:45 am on 03-07-18. Also, requested that if patient has further questions that she give our office a call.

## 2018-03-01 ENCOUNTER — Telehealth: Payer: Self-pay | Admitting: *Deleted

## 2018-03-01 NOTE — Telephone Encounter (Signed)
Patient was contacted today and notified that arrival time day of surgery has changed again.   The patient will need to check in at the [facility] desk at 10:15 am on 03-07-18.   Patient verbalizes understanding.   Also, aware that we were not able to get in touch with her daughter previously about the change in times as requested.   Patient states that is fine because her granddaughter will be taking her day of surgery instead.

## 2018-03-02 ENCOUNTER — Inpatient Hospital Stay: Admission: RE | Admit: 2018-03-02 | Payer: Medicare Other | Source: Ambulatory Visit

## 2018-03-03 MED ORDER — FENTANYL CITRATE (PF) 100 MCG/2ML IJ SOLN
25.0000 ug | INTRAMUSCULAR | Status: DC | PRN
  Administered 2018-03-07 (×2): 25 ug via INTRAVENOUS

## 2018-03-03 MED ORDER — ONDANSETRON HCL 4 MG/2ML IJ SOLN
4.0000 mg | Freq: Once | INTRAMUSCULAR | Status: AC | PRN
  Administered 2018-03-07: 4 mg via INTRAVENOUS

## 2018-03-06 MED ORDER — CEFAZOLIN SODIUM-DEXTROSE 2-4 GM/100ML-% IV SOLN
2.0000 g | INTRAVENOUS | Status: AC
  Administered 2018-03-07: 2 g via INTRAVENOUS

## 2018-03-07 ENCOUNTER — Ambulatory Visit
Admission: RE | Admit: 2018-03-07 | Discharge: 2018-03-07 | Disposition: A | Discharge status: Home / Self Care | Payer: Medicare Other | Source: Ambulatory Visit | Attending: Surgery | Admitting: Surgery

## 2018-03-07 ENCOUNTER — Ambulatory Visit: Payer: Medicare Other

## 2018-03-07 ENCOUNTER — Ambulatory Visit: Payer: Medicare Other | Admitting: Anesthesiology

## 2018-03-07 ENCOUNTER — Encounter
Admission: RE | Disposition: A | Discharge status: Home / Self Care | Payer: Self-pay | Source: Home / Self Care | Attending: Surgery

## 2018-03-07 ENCOUNTER — Other Ambulatory Visit: Payer: Self-pay

## 2018-03-07 ENCOUNTER — Ambulatory Visit
Admission: RE | Admit: 2018-03-07 | Discharge: 2018-03-07 | Disposition: A | Discharge status: Home / Self Care | Payer: Medicare Other | Attending: Surgery | Admitting: Surgery

## 2018-03-07 ENCOUNTER — Encounter: Payer: Self-pay | Admitting: *Deleted

## 2018-03-07 DIAGNOSIS — M1990 Unspecified osteoarthritis, unspecified site: Secondary | ICD-10-CM | POA: Insufficient documentation

## 2018-03-07 DIAGNOSIS — I129 Hypertensive chronic kidney disease with stage 1 through stage 4 chronic kidney disease, or unspecified chronic kidney disease: Secondary | ICD-10-CM | POA: Insufficient documentation

## 2018-03-07 DIAGNOSIS — E042 Nontoxic multinodular goiter: Secondary | ICD-10-CM | POA: Diagnosis not present

## 2018-03-07 DIAGNOSIS — Z8041 Family history of malignant neoplasm of ovary: Secondary | ICD-10-CM | POA: Insufficient documentation

## 2018-03-07 DIAGNOSIS — Z823 Family history of stroke: Secondary | ICD-10-CM | POA: Diagnosis not present

## 2018-03-07 DIAGNOSIS — Z833 Family history of diabetes mellitus: Secondary | ICD-10-CM | POA: Diagnosis not present

## 2018-03-07 DIAGNOSIS — Z885 Allergy status to narcotic agent status: Secondary | ICD-10-CM | POA: Insufficient documentation

## 2018-03-07 DIAGNOSIS — I889 Nonspecific lymphadenitis, unspecified: Secondary | ICD-10-CM | POA: Diagnosis not present

## 2018-03-07 DIAGNOSIS — N189 Chronic kidney disease, unspecified: Secondary | ICD-10-CM | POA: Diagnosis not present

## 2018-03-07 DIAGNOSIS — Z90710 Acquired absence of both cervix and uterus: Secondary | ICD-10-CM | POA: Insufficient documentation

## 2018-03-07 DIAGNOSIS — Z79899 Other long term (current) drug therapy: Secondary | ICD-10-CM | POA: Insufficient documentation

## 2018-03-07 DIAGNOSIS — Z96651 Presence of right artificial knee joint: Secondary | ICD-10-CM | POA: Diagnosis not present

## 2018-03-07 DIAGNOSIS — C773 Secondary and unspecified malignant neoplasm of axilla and upper limb lymph nodes: Secondary | ICD-10-CM | POA: Diagnosis not present

## 2018-03-07 DIAGNOSIS — Z801 Family history of malignant neoplasm of trachea, bronchus and lung: Secondary | ICD-10-CM | POA: Diagnosis not present

## 2018-03-07 DIAGNOSIS — Z8249 Family history of ischemic heart disease and other diseases of the circulatory system: Secondary | ICD-10-CM | POA: Diagnosis not present

## 2018-03-07 DIAGNOSIS — C50912 Malignant neoplasm of unspecified site of left female breast: Secondary | ICD-10-CM | POA: Insufficient documentation

## 2018-03-07 DIAGNOSIS — I272 Pulmonary hypertension, unspecified: Secondary | ICD-10-CM | POA: Diagnosis not present

## 2018-03-07 DIAGNOSIS — E1122 Type 2 diabetes mellitus with diabetic chronic kidney disease: Secondary | ICD-10-CM | POA: Diagnosis not present

## 2018-03-07 DIAGNOSIS — Z7982 Long term (current) use of aspirin: Secondary | ICD-10-CM | POA: Diagnosis not present

## 2018-03-07 DIAGNOSIS — I888 Other nonspecific lymphadenitis: Secondary | ICD-10-CM | POA: Insufficient documentation

## 2018-03-07 DIAGNOSIS — M1712 Unilateral primary osteoarthritis, left knee: Secondary | ICD-10-CM | POA: Diagnosis not present

## 2018-03-07 DIAGNOSIS — Z9049 Acquired absence of other specified parts of digestive tract: Secondary | ICD-10-CM | POA: Insufficient documentation

## 2018-03-07 DIAGNOSIS — C50412 Malignant neoplasm of upper-outer quadrant of left female breast: Secondary | ICD-10-CM | POA: Diagnosis not present

## 2018-03-07 DIAGNOSIS — C50919 Malignant neoplasm of unspecified site of unspecified female breast: Secondary | ICD-10-CM | POA: Diagnosis not present

## 2018-03-07 HISTORY — PX: SENTINEL NODE BIOPSY: SHX6608

## 2018-03-07 LAB — GLUCOSE, CAPILLARY: Glucose-Capillary: 93 mg/dL (ref 70–99)

## 2018-03-07 SURGERY — BIOPSY, LYMPH NODE, SENTINEL
Anesthesia: General | Laterality: Left

## 2018-03-07 MED ORDER — DEXAMETHASONE SODIUM PHOSPHATE 10 MG/ML IJ SOLN
INTRAMUSCULAR | Status: DC | PRN
  Administered 2018-03-07: 5 mg via INTRAVENOUS

## 2018-03-07 MED ORDER — TECHNETIUM TC 99M SULFUR COLLOID FILTERED
0.7520 | Freq: Once | INTRAVENOUS | Status: AC | PRN
  Administered 2018-03-07: 0.752 via INTRADERMAL

## 2018-03-07 MED ORDER — CHLORHEXIDINE GLUCONATE CLOTH 2 % EX PADS: 6.0000 | MEDICATED_PAD | Freq: Once | CUTANEOUS | Status: DC

## 2018-03-07 MED ORDER — PROPOFOL 10 MG/ML IV BOLUS
INTRAVENOUS | Status: DC | PRN
  Administered 2018-03-07: 100 mg via INTRAVENOUS

## 2018-03-07 MED ORDER — LIDOCAINE HCL (PF) 2 % IJ SOLN
INTRAMUSCULAR | Status: AC
  Filled 2018-03-07: qty 10

## 2018-03-07 MED ORDER — FAMOTIDINE 20 MG PO TABS
ORAL_TABLET | ORAL | Status: AC
  Administered 2018-03-07: 20 mg via ORAL
  Filled 2018-03-07: qty 1

## 2018-03-07 MED ORDER — FENTANYL CITRATE (PF) 100 MCG/2ML IJ SOLN
INTRAMUSCULAR | Status: AC
  Filled 2018-03-07: qty 2

## 2018-03-07 MED ORDER — CEFAZOLIN SODIUM-DEXTROSE 2-4 GM/100ML-% IV SOLN
INTRAVENOUS | Status: AC
  Filled 2018-03-07: qty 100

## 2018-03-07 MED ORDER — PHENYLEPHRINE HCL 10 MG/ML IJ SOLN
INTRAMUSCULAR | Status: DC | PRN
  Administered 2018-03-07: 100 ug via INTRAVENOUS

## 2018-03-07 MED ORDER — SODIUM CHLORIDE 0.9 % IV SOLN
INTRAVENOUS | Status: DC
  Administered 2018-03-07: 11:00:00 via INTRAVENOUS

## 2018-03-07 MED ORDER — FENTANYL CITRATE (PF) 100 MCG/2ML IJ SOLN
25.0000 ug | INTRAMUSCULAR | Status: DC | PRN
  Administered 2018-03-07 (×2): 50 ug via INTRAVENOUS

## 2018-03-07 MED ORDER — BUPIVACAINE-EPINEPHRINE (PF) 0.25% -1:200000 IJ SOLN
INTRAMUSCULAR | Status: DC | PRN
  Administered 2018-03-07: 20 mL

## 2018-03-07 MED ORDER — PROPOFOL 10 MG/ML IV BOLUS
INTRAVENOUS | Status: AC
  Filled 2018-03-07: qty 20

## 2018-03-07 MED ORDER — BUPIVACAINE-EPINEPHRINE (PF) 0.25% -1:200000 IJ SOLN
INTRAMUSCULAR | Status: AC
  Filled 2018-03-07: qty 30

## 2018-03-07 MED ORDER — ONDANSETRON HCL 4 MG/2ML IJ SOLN: 4.0000 mg | Freq: Once | INTRAMUSCULAR | Status: DC | PRN

## 2018-03-07 MED ORDER — FAMOTIDINE 20 MG PO TABS
20.0000 mg | ORAL_TABLET | Freq: Once | ORAL | Status: AC
  Administered 2018-03-07: 20 mg via ORAL

## 2018-03-07 MED ORDER — LIDOCAINE HCL (CARDIAC) PF 100 MG/5ML IV SOSY
PREFILLED_SYRINGE | INTRAVENOUS | Status: DC | PRN
  Administered 2018-03-07: 80 mg via INTRAVENOUS

## 2018-03-07 MED ORDER — HYDROCODONE-ACETAMINOPHEN 5-325 MG PO TABS: 1.0000 | ORAL_TABLET | ORAL | 0 refills | Status: DC | PRN

## 2018-03-07 SURGICAL SUPPLY — 32 items
APPLIER CLIP 11 MED OPEN (CLIP) ×3
APPLIER CLIP 9.375 SM OPEN (CLIP) ×6
CANISTER SUCT 1200ML W/VALVE (MISCELLANEOUS) ×3 IMPLANT
CHLORAPREP W/TINT 26ML (MISCELLANEOUS) ×3 IMPLANT
CLIP APPLIE 11 MED OPEN (CLIP) ×1 IMPLANT
CLIP APPLIE 9.375 SM OPEN (CLIP) ×2 IMPLANT
COVER PROBE FLX POLY STRL (MISCELLANEOUS) ×3 IMPLANT
COVER WAND RF STERILE (DRAPES) ×3 IMPLANT
DERMABOND ADVANCED (GAUZE/BANDAGES/DRESSINGS) ×2
DERMABOND ADVANCED .7 DNX12 (GAUZE/BANDAGES/DRESSINGS) ×1 IMPLANT
DRAPE LAPAROTOMY 100X77 ABD (DRAPES) ×3 IMPLANT
DRAPE SHEET LG 3/4 BI-LAMINATE (DRAPES) IMPLANT
ELECT CAUTERY BLADE TIP 2.5 (TIP) ×3
ELECT REM PT RETURN 9FT ADLT (ELECTROSURGICAL) ×3
ELECTRODE CAUTERY BLDE TIP 2.5 (TIP) ×1 IMPLANT
ELECTRODE REM PT RTRN 9FT ADLT (ELECTROSURGICAL) ×1 IMPLANT
GAUZE SPONGE 4X4 12PLY STRL (GAUZE/BANDAGES/DRESSINGS) ×3 IMPLANT
GLOVE BIO SURGEON STRL SZ7 (GLOVE) ×3 IMPLANT
GLOVE INDICATOR 7.5 STRL GRN (GLOVE) ×3 IMPLANT
GOWN STRL REUS W/ TWL LRG LVL3 (GOWN DISPOSABLE) ×1 IMPLANT
GOWN STRL REUS W/TWL LRG LVL3 (GOWN DISPOSABLE) ×2
NEEDLE HYPO 22GX1.5 SAFETY (NEEDLE) ×3 IMPLANT
PACK BASIN MINOR ARMC (MISCELLANEOUS) ×3 IMPLANT
SLEVE PROBE SENORX GAMMA FIND (MISCELLANEOUS) ×3 IMPLANT
SPONGE KITTNER 5P (MISCELLANEOUS) ×3 IMPLANT
SPONGE LAP 18X18 RF (DISPOSABLE) ×3 IMPLANT
SUT MNCRL 4-0 (SUTURE) ×2
SUT MNCRL 4-0 27XMFL (SUTURE) ×1
SUT VIC AB 3-0 SH 27 (SUTURE) ×2
SUT VIC AB 3-0 SH 27X BRD (SUTURE) ×1 IMPLANT
SUTURE MNCRL 4-0 27XMF (SUTURE) ×1 IMPLANT
SYR 20CC LL (SYRINGE) ×3 IMPLANT

## 2018-03-07 NOTE — Anesthesia Post-op Follow-up Note (Signed)
Anesthesia QCDR form completed.        

## 2018-03-07 NOTE — Anesthesia Postprocedure Evaluation (Signed)
Anesthesia Post Note  Patient: Ellen [name]  Procedure(s) Performed: SENTINEL NODE BIOPSY (Left )  Patient location during evaluation: PACU Anesthesia Type: General Level of consciousness: awake and alert and oriented Pain management: pain level controlled Vital Signs Assessment: post-procedure vital signs reviewed and stable Respiratory status: spontaneous breathing, nonlabored ventilation and respiratory function stable Cardiovascular status: blood pressure returned to baseline and stable Postop Assessment: no signs of nausea or vomiting Anesthetic complications: no     Last Vitals:  Vitals:   03/07/18 1423 03/07/18 1433  BP:  (!) 135/57  Pulse: 76 76  Resp: 20 16  Temp:    SpO2: 95% 100%    Last Pain:  Vitals:   03/07/18 1433  TempSrc:   PainSc: 3                  Berlene Dixson

## 2018-03-07 NOTE — Anesthesia Preprocedure Evaluation (Signed)
Anesthesia Evaluation  Patient identified by MRN, date of birth, ID band Patient awake    Reviewed: Allergy & Precautions, NPO status , Patient's Chart, lab work & pertinent test results  History of Anesthesia Complications Negative for: history of anesthetic complications  Airway Mallampati: II  TM Distance: >3 FB Neck ROM: Full    Dental  (+) Upper Dentures, Lower Dentures   Pulmonary neg pulmonary ROS, neg sleep apnea, neg COPD,    breath sounds clear to auscultation- rhonchi (-) wheezing      Cardiovascular hypertension, Pt. on medications (-) CAD, (-) Past MI, (-) Cardiac Stents and (-) CABG  Rhythm:Regular Rate:Normal - Systolic murmurs and - Diastolic murmurs    Neuro/Psych  Headaches, negative psych ROS   GI/Hepatic negative GI ROS, Neg liver ROS,   Endo/Other  diabetes  Renal/GU Renal InsufficiencyRenal disease     Musculoskeletal  (+) Arthritis ,   Abdominal (+) - obese,   Peds  Hematology negative hematology ROS (+)   Anesthesia Other Findings Past Medical History: No date: Arthritis 01/2018: Cancer ([place])     Comment:  left breast  No date: Cataract No date: Chronic headaches No date: CKD (chronic kidney disease) No date: Diabetes mellitus without complication (HCC)     Comment:  no longer taking metformin; diet controlled now No date: Diverticulitis No date: History of syncope No date: Hypertension No date: Multinodular goiter No date: Primary osteoarthritis of left knee No date: Pulmonary hypertension (HCC)   Reproductive/Obstetrics                             Anesthesia Physical Anesthesia Plan  ASA: III  Anesthesia Plan: General   Post-op Pain Management:    Induction: Intravenous  PONV Risk Score and Plan: 2 and Ondansetron and Dexamethasone  Airway Management Planned: LMA  Additional Equipment:   Intra-op Plan:   Post-operative Plan:   Informed  Consent: I have reviewed the patients History and Physical, chart, labs and discussed the procedure including the risks, benefits and alternatives for the proposed anesthesia with the patient or authorized representative who has indicated his/her understanding and acceptance.     Dental advisory given  Plan Discussed with: CRNA and Anesthesiologist  Anesthesia Plan Comments:         Anesthesia Quick Evaluation

## 2018-03-07 NOTE — Op Note (Signed)
  Pre-operative Diagnosis: invasive Breast Cancer Left    Post-operative Diagnosis: Same   Surgeon:Kadince Boxley, MD FACS  Anesthesia: General LMA  Procedure: sentinel lymph  node biopsy  Procedure Details  The patient was seen again in the Holding Room. The benefits, complications, treatment options, and expected outcomes were discussed with the patient. The risks of bleeding, infection, recurrence of symptoms, failure to resolve symptoms, hematoma, seroma, open wound, cosmetic deformity, and the need for further surgery were discussed.  The patient was taken to Operating Room, identified as [employer] and the procedure verified.  A Time Out was held and the above information confirmed.  Prior to the induction of general anesthesia, antibiotic prophylaxis was administered. VTE prophylaxis was in place. Appropriate anesthesia was then administered and tolerated well. The chest was prepped with Chloraprep and draped in the sterile fashion. The patient was positioned in the supine position.   Lymphozurin blue dye was injected periareolar early under aseptic conditions. Using the hand-held probe an area of high counts was identified in the axilla, an incision was made and direction by the probe aided in dissection of two lymph nodes  which were both blue and hot. We clip the lymphatic channels in the standard fashion.    Additional Marcaine was infiltrated into the skin and subcutaneous tissues of the cavity. Once assuring that hemostasis was adequate and checked multiple times the wound was closed with interrupted 3-0 Vicryl followed by 4-0 subcuticular Monocryl sutures. Dermabond was placed  Patient was taken to the recovery room in stable condition.   Caroleen Hamman, MD, FACS

## 2018-03-07 NOTE — Anesthesia Procedure Notes (Addendum)
Procedure Name: LMA Insertion Date/Time: 03/07/2018 1:04 PM Performed by: Rudean Hitt, CRNA Pre-anesthesia Checklist: Patient identified, Patient being monitored, Timeout performed, Emergency Drugs available and Suction available Patient Re-evaluated:Patient Re-evaluated prior to induction Oxygen Delivery Method: Circle system utilized Preoxygenation: Pre-oxygenation with 100% oxygen Induction Type: IV induction Ventilation: Mask ventilation without difficulty LMA: LMA inserted LMA Size: 3.5 Tube type: Oral Number of attempts: 1 Placement Confirmation: positive ETCO2 and breath sounds checked- equal and bilateral Tube secured with: Tape Dental Injury: Teeth and Oropharynx as per pre-operative assessment

## 2018-03-07 NOTE — Progress Notes (Signed)
Upper and lower dentures returned to patient

## 2018-03-07 NOTE — Transfer of Care (Signed)
Immediate Anesthesia Transfer of Care Note  Patient: Ellen [name]  Procedure(s) Performed: SENTINEL NODE BIOPSY (Left )  Patient Location: PACU  Anesthesia Type:General  Level of Consciousness: awake, alert  and patient cooperative  Airway & Oxygen Therapy: Patient Spontanous Breathing and Patient connected to face mask oxygen  Post-op Assessment: Report given to RN and Post -op Vital signs reviewed and stable  Post vital signs: Reviewed and stable  Last Vitals:  Vitals Value Taken Time  BP 167/64 03/07/2018  2:02 PM  Temp    Pulse 75 03/07/2018  2:06 PM  Resp 19 03/07/2018  2:06 PM  SpO2 100 % 03/07/2018  2:06 PM  Vitals shown include unvalidated device data.  Last Pain:  Vitals:   03/07/18 1030  TempSrc: Temporal  PainSc: 0-No pain         Complications: No apparent anesthesia complications

## 2018-03-07 NOTE — Discharge Instructions (Addendum)

## 2018-03-07 NOTE — H&P (Signed)
History of Present Illness: Ellen [name] is a 85 y.o. female s/p recent left breast wire-localized lumpectomy.  Her pathology got upgraded from DCIS on biopsy to invasive breast cancer on final pathology, and she was scheduled today for sentinel node biopsy. She has had her SLNB cancelled two times. Once because she had breakfast and the last one due to severe abdominal pain. W/U did not reveal any concerning findings. Today she feels well and is ready to proceed  Past Medical History:     Past Medical History:  Diagnosis Date  . Arthritis   . Cancer ([place]) 01/2018   left breast   . Cataract   . Chronic headaches   . CKD (chronic kidney disease)   . Diabetes mellitus without complication ([place])    no longer taking metformin; diet controlled now  . Diverticulitis   . History of syncope   . Hypertension   . Multinodular goiter   . Primary osteoarthritis of left knee   . Pulmonary hypertension ([place])      Past Surgical History:      Past Surgical History:  Procedure Laterality Date  . ABDOMINAL HYSTERECTOMY    . APPENDECTOMY    . BREAST BIOPSY Left 01/03/2018   left breast stereo/x clip/path pending  . BREAST LUMPECTOMY Left 01/12/2018  . BREAST LUMPECTOMY WITH NEEDLE LOCALIZATION Left 01/12/2018   Procedure: BREAST LUMPECTOMY WITH NEEDLE LOCALIZATION;  Surgeon: Jules Husbands, MD;  Location: [facility] ORS;  Service: General;  Laterality: Left;  . CHOLECYSTECTOMY    . COLON SURGERY    . COLOSTOMY  2008  . COLOSTOMY REVERSAL  2008  . EYE SURGERY    . REPLACEMENT TOTAL KNEE Right 2018    Home Medications:        Prior to Admission medications   Medication Sig Start Date End Date Taking? Authorizing Provider  acetaminophen (TYLENOL) 500 MG tablet Take 1,000 mg by mouth every 6 (six) hours as needed for moderate pain or headache.     [provider]  amLODipine (NORVASC) 5 MG tablet Take 5 mg by mouth daily.  07/07/16   [provider]  amoxicillin-clavulanate (AUGMENTIN) 875-125 MG tablet Take 1 tablet by mouth 2 (two) times daily for 10 days. 02/16/18 02/26/18  Harvest Dark, MD  aspirin 81 MG chewable tablet Chew 81 mg by mouth daily.     [provider]  cloNIDine (CATAPRES) 0.1 MG tablet TAKE 1 TABLET BY MOUTH DAILY AS NEEDED FOR ELEVATED BLOOD PRESSURE GREATER THAN 180/100 Patient taking differently: Take 0.1 mg by mouth daily as needed (for BP > 180/100).  11/10/17   Karamalegos, Devonne Doughty, DO  gabapentin (NEURONTIN) 100 MG capsule Start 1 capsule daily, increase by 1 cap every 1 week as tolerated, next add 1 in evening, up to max 3 pills in evening Patient taking differently: Take 100 mg by mouth daily.  11/10/17   Karamalegos, Devonne Doughty, DO  HYDROcodone-acetaminophen (NORCO/VICODIN) 5-325 MG tablet Take 1 tablet by mouth every 6 (six) hours as needed for moderate pain. Patient not taking: Reported on 02/14/2018 01/12/18   Caroleen Hamman F, MD  lisinopril-hydrochlorothiazide (PRINZIDE,ZESTORETIC) 20-12.5 MG tablet Take 1 tablet by mouth daily. 12/09/17   Mikey College, NP  pravastatin (PRAVACHOL) 20 MG tablet Take 20 mg by mouth at bedtime.  07/28/17   [provider]  traMADol (ULTRAM) 50 MG tablet Take 1 tablet (50 mg total) by mouth every 6 (six) hours as needed. 02/16/18   Harvest Dark, MD  Allergies:     Allergies  Allergen Reactions  . Codeine Nausea Only  . Morphine And Related Nausea Only    Social History:  reports that she has never smoked. She has never used smokeless tobacco. She reports that she does not drink alcohol or use drugs.   Family History:      Family History  Problem Relation Age of Onset  . Lung cancer Mother   . Diabetes Father   . Stroke Father   . Hypertension Maternal Grandmother   . Hypertension Maternal Uncle   . Hypertension Maternal Aunt   . Ovarian cancer Maternal Aunt   . Heart disease  Daughter     Review of Systems: Review of Systems  Constitutional: Negative for chills and fever.  HENT: Negative for hearing loss.   Respiratory: Negative for shortness of breath.   Cardiovascular: Negative for chest pain.  Gastrointestinal: Positive for abdominal pain. Negative for blood in stool, constipation, diarrhea, nausea and vomiting.  Genitourinary: Negative for dysuria.  Musculoskeletal: Negative for myalgias.  Skin: Negative for rash.  Neurological: Negative for dizziness.  Psychiatric/Behavioral: Negative for depression.    Physical Exam  CONSTITUTIONAL: No acute distress HEENT:  Normocephalic, atraumatic, extraocular motion intact. NECK: Trachea is midline, and there is no jugular venous distension. RESPIRATORY:  Lungs are clear, and breath sounds are equal bilaterally. Normal respiratory effort without pathologic use of accessory muscles. CARDIOVASCULAR: Heart is regular without murmurs, gallops, or rubs. GI: The abdomen is soft, non-distended, with tenderness to palpation in the left mid abdomen.  There is no diffuse peritonitis.  Prior scars from exploratory laparotomy with end colostomy and reversal are well healed.  MUSCULOSKELETAL:  Normal muscle strength and tone in all four extremities.  No peripheral edema or cyanosis. SKIN: Skin turgor is normal. There are no pathologic skin lesions.  NEUROLOGIC:  Motor and sensation is grossly normal.  Cranial nerves are grossly intact. PSYCH:  Alert and oriented to person, place and time. Affect is normal.  Laboratory Analysis: LabResultsLast24Hours  Results for orders placed or performed during the hospital encounter of 02/16/18 (from the past 24 hour(s))  Lipase, blood     Status: None   Collection Time: 02/16/18  1:09 PM  Result Value Ref Range   Lipase 27 11 - 51 U/L  Comprehensive metabolic panel     Status: Abnormal   Collection Time: 02/16/18  1:09 PM  Result Value Ref Range   Sodium 139 135 - 145  mmol/L   Potassium 4.1 3.5 - 5.1 mmol/L   Chloride 102 98 - 111 mmol/L   CO2 26 22 - 32 mmol/L   Glucose, Bld 95 70 - 99 mg/dL   BUN 25 (H) 8 - 23 mg/dL   Creatinine, Ser 1.11 (H) 0.44 - 1.00 mg/dL   Calcium 9.4 8.9 - 10.3 mg/dL   Total Protein 8.0 6.5 - 8.1 g/dL   Albumin 3.3 (L) 3.5 - 5.0 g/dL   AST 20 15 - 41 U/L   ALT 10 0 - 44 U/L   Alkaline Phosphatase 59 38 - 126 U/L   Total Bilirubin 0.7 0.3 - 1.2 mg/dL   GFR calc non Af Amer 45 (L) >60 mL/min   GFR calc Af Amer 52 (L) >60 mL/min   Anion gap 11 5 - 15  CBC     Status: Abnormal   Collection Time: 02/16/18  1:09 PM  Result Value Ref Range   WBC 7.3 4.0 - 10.5 K/uL   RBC 4.39  3.87 - 5.11 MIL/uL   Hemoglobin 11.4 (L) 12.0 - 15.0 g/dL   HCT 36.4 36.0 - 46.0 %   MCV 82.9 80.0 - 100.0 fL   MCH 26.0 26.0 - 34.0 pg   MCHC 31.3 30.0 - 36.0 g/dL   RDW 15.6 (H) 11.5 - 15.5 %   Platelets 375 150 - 400 K/uL   nRBC 0.0 0.0 - 0.2 %  Urinalysis, Complete w Microscopic     Status: Abnormal   Collection Time: 02/16/18  5:38 PM  Result Value Ref Range   Color, Urine YELLOW (A) YELLOW   APPearance CLEAR (A) CLEAR   Specific Gravity, Urine 1.014 1.005 - 1.030   pH 5.0 5.0 - 8.0   Glucose, UA NEGATIVE NEGATIVE mg/dL   Hgb urine dipstick NEGATIVE NEGATIVE   Bilirubin Urine NEGATIVE NEGATIVE   Ketones, ur NEGATIVE NEGATIVE mg/dL   Protein, ur NEGATIVE NEGATIVE mg/dL   Nitrite NEGATIVE NEGATIVE   Leukocytes, UA NEGATIVE NEGATIVE   RBC / HPF 0-5 0 - 5 RBC/hpf   WBC, UA 0-5 0 - 5 WBC/hpf   Bacteria, UA RARE (A) NONE SEEN   Squamous Epithelial / LPF 6-10 0 - 5   Mucus PRESENT       Nm Sentinel Node Injection  Result Date: 02/16/2018 CLINICAL DATA:  Left breast cancer. EXAM: NUCLEAR MEDICINE BREAST LYMPHOSCINTIGRAPHY TECHNIQUE: Intradermal injection of radiopharmaceutical was performed at the 12 o'clock, 3 o'clock, 6 o'clock, and 9 o'clock positions around the left nipple. The patient  was then sent to the operating room where the sentinel node(s) were identified and removed by the surgeon. RADIOPHARMACEUTICALS:  Total of 1 mCi Millipore-filtered Technetium-8m sulfur colloid, injected in four aliquots of 0.25 mCi each. IMPRESSION: Uncomplicated intradermal injection of a total of 1 mCi Technetium-44m sulfur colloid for purposes of sentinel node identification. Electronically Signed   By: Aletta Edouard M.D.   On: 02/16/2018 14:06    Assessment and Plan: Plan for SLNBx left . Procedure d/w the pt in detail. Risks, benefits and possible complication including but not limited to bleeding, infection, seroma, lymphedema and pain. She understands and wishes to proceed.

## 2018-03-08 ENCOUNTER — Encounter: Payer: Self-pay | Admitting: Surgery

## 2018-03-08 ENCOUNTER — Telehealth: Payer: Self-pay | Admitting: Family Medicine

## 2018-03-08 NOTE — Telephone Encounter (Signed)
Patient was throwing up and diarrhea on Sunday had abdominal cramp and noticed Fecal incontinence on Monday which was black in color and nauseous. She had procedure done yesterday for lymph node wanted to aware Dr. Raliegh Ip before her upcoming appointment on Monday. She does not have any symptom of nausea or diarrhea or notice black stool now.

## 2018-03-08 NOTE — Telephone Encounter (Signed)
Pt asked for a call back to discuss something that happened after a procedure she had [phone].  She did schedule an appt for Monday.

## 2018-03-08 NOTE — Telephone Encounter (Signed)
Acknowleged. Reviewed chart she had been seen in ED for Enteritis similar symptoms in mid January 2020.  May need to follow-up with General Surgery again as they treated her at that time, after having imaging test CT. Treated with antibiotics for this previously.  [name], DO [facility] 03/08/2018, 3:26 PM

## 2018-03-09 ENCOUNTER — Telehealth: Payer: Self-pay | Admitting: Surgery

## 2018-03-09 LAB — SURGICAL PATHOLOGY

## 2018-03-09 NOTE — Telephone Encounter (Signed)
D/w the pt in detail about path results. She is very [employer] attorney. She is otherwise doing very well

## 2018-03-13 ENCOUNTER — Encounter: Payer: Self-pay | Admitting: Family Medicine

## 2018-03-13 ENCOUNTER — Ambulatory Visit (INDEPENDENT_AMBULATORY_CARE_PROVIDER_SITE_OTHER): Payer: Medicare Other | Admitting: Family Medicine

## 2018-03-13 VITALS — BP 116/48 | HR 68 | Temp 98.1°F | Resp 16 | Ht 60.0 in | Wt 159.4 lb

## 2018-03-13 DIAGNOSIS — M25511 Pain in right shoulder: Secondary | ICD-10-CM

## 2018-03-13 DIAGNOSIS — R159 Full incontinence of feces: Secondary | ICD-10-CM

## 2018-03-13 DIAGNOSIS — C50412 Malignant neoplasm of upper-outer quadrant of left female breast: Secondary | ICD-10-CM

## 2018-03-13 DIAGNOSIS — M12812 Other specific arthropathies, not elsewhere classified, left shoulder: Secondary | ICD-10-CM | POA: Diagnosis not present

## 2018-03-13 DIAGNOSIS — G8929 Other chronic pain: Secondary | ICD-10-CM

## 2018-03-13 DIAGNOSIS — M25512 Pain in left shoulder: Secondary | ICD-10-CM | POA: Diagnosis not present

## 2018-03-13 MED ORDER — LIDOCAINE HCL (PF) 1 % IJ SOLN
4.0000 mL | Freq: Once | INTRAMUSCULAR | Status: AC
  Administered 2018-03-13: 4 mL

## 2018-03-13 MED ORDER — METHYLPREDNISOLONE ACETATE 40 MG/ML IJ SUSP
40.0000 mg | Freq: Once | INTRAMUSCULAR | Status: AC
  Administered 2018-03-13: 40 mg via INTRA_ARTICULAR

## 2018-03-13 NOTE — Patient Instructions (Addendum)
Thank you for coming to the office today.  You received a Left AND Right Shoulder Joint steroid injection today. - Lidocaine numbing medicine may ease the pain initially for a few hours until it wears off - As discussed, you may experience a "steroid flare" this evening or within 24-48 hours, anytime medicine is injected into an inflamed joint it can cause the pain to get worse temporarily - Everyone responds differently to these injections, it depends on the patient and the severity of the joint problem, it may provide anywhere from days to weeks, to months of relief. Ideal response is >6 months relief - Try to take it easy for next 1-2 days, avoid over activity and strain on joint (limit lifting for shoulder) - Recommend the following:   - For swelling - rest, compression sleeve / ACE wrap, elevation, and ice packs as needed for first few days   - For pain in future may use heating pad or moist heat as needed  Medication  Recommend to start taking Tylenol Extra Strength 500mg  tabs - take 1 to 2 tabs per dose (max 1000mg ) every 6-8 hours for pain (take regularly, don't skip a dose for next 7 days), max 24 hour daily dose is 6 tablets or 3000mg . In the future you can repeat the same everyday Tylenol course for 1-2 weeks at a time.    If not improving as expected over next several weeks, please follow-up sooner for re-evaluation.    Please schedule a Follow-up Appointment to: Return in about 3 months (around 06/11/2018) for DM A1c, Shoulder Pain Arthritis.  If you have any other questions or concerns, please feel free to call the office or send a message through [place]. You may also schedule an earlier appointment if necessary.  Additionally, you may be receiving a survey about your experience at our office within a few days to 1 week by e-mail or mail. We value your feedback.  [name], DO [facility], [facility]  Range of Motion Shoulder Exercises  [place] with your good arm against a counter or table for support [facility] forward with a wide stance (make sure your body is comfortable) - Your painful shoulder should hang down and feel "heavy" - Gently move your painful arm in small circles "clockwise" for several turns - Switch to "counterclockwise" for several turns - Early on keep circles narrow and move slowly - Later in rehab, move in larger circles and faster movement   Wall Crawl - Stand close (about 1-2 ft away) to a wall, facing it directly - Reach out with your arm of painful shoulder and place fingers (not palm) on wall - You should make contact with wall at your waist level - Slowly walk your fingers up the wall. Stay in contact with wall entire time, do not remove fingers - Keep walking fingers up wall until you reach shoulder level - You may feel tightening or mild discomfort, once you reach a height that causes pain or if you are already above your shoulder height then stop. Repeat from starting position. - Early on stand closer to wall, move fingers slowly, and stay at or below shoulder level - Later in rehab, stand farther away from wall (fingertips), move fingers quicker, go above shoulder level

## 2018-03-13 NOTE — Assessment & Plan Note (Signed)
Consistent with subacute on chronic L>R shoulder chronic shoulder pain and bursitis with known LEFT rotator cuff tendinopathy with significant reduced active ROM but without significant evidence of muscle tear (no weakness).  - 85 yr old patient with likely underlying arthritis - No recent imaging shoulders on chart - Prior injection subacromial last for >3 months  Plan: Right and Left shoulder subacromial steroid injection performed today, see procedure note for details.  May take Tylenol Ex Str 1-2 q 6 hr PRN Relative rest but keep shoulder mobile, demonstrated ROM exercises, avoid heavy lifting May try heating pad PRN Follow-up 4-6 weeks if not improved for re-evaluation, consider referral to Physical Therapy vs Orthopedic if not improving  Follow in 3 months as scheduled, may repeat injection.

## 2018-03-13 NOTE — Assessment & Plan Note (Signed)
Followed by current team at [facility] [address] [facility] [place] and Gen Surgery Dr Dahlia Byes and Dr Baruch Gouty Rad-Onc S/p biopsy diagnosis S/p sentinel lymph node biopsy negative Next due for L lumpectomy followed by additional treatments

## 2018-03-13 NOTE — Assessment & Plan Note (Signed)
Bilateral shoulder pain, L>R with known rotator cuff arthropathy See A&P for etiology of pain - Treat with injection Follow-up if not improving

## 2018-03-13 NOTE — Progress Notes (Signed)
Subjective:    Patient ID: Ellen [name], female    DOB: [birth year], 85 y.o.   MRN: [id number]  Ellen [name] is a 85 y.o. female presenting on 03/13/2018 for Encopresis and Shoulder Pain (left side)   HPI   Fecal Incontinence x 1 episode Reports one episode of fecal incontinence over weekend, she states it was darker and small amount liquid, otherwise she has normal bowel movements and stools. Normally well formed. She attributes this possibly to dye she received for radiology. - Denies any abdominal pain, nausea vomiting, abdominal bloating, dark stool blood in stool  Chronic Bilateral Shoulder Pain / Left Rotator Cuff Arthropathy - Last visit with me for this problem 12/16/17 shoulder pain L, treated with steroid subacromial injection, see prior notes for background information. - Interval update with significant improvement on L shoulder injection at that time, with about 3 months of good relief of pain and improved ROM - Today patient reports due for another injection in shoulder, she request one in each shoulder. She has acknowledged history of L shoulder rotator cuff damage and arthropathy, prior orthopedic years ago has advised that her "rotator cuff" is limited - If worsening she has had difficulty moving her left shoulder up, she can lift it with her R arm. Admits throbbing pain. - Taking Tylenol Ext Str 500mg  x 2 per dose up to 3 times a day, some relief - Tried muscle rub on it - Denies any bruising, redness, swelling, numbness, tingling, weakness  Follow-up Left Breast Cancer, invasive ductal carcinoma Last update with sentinel lymph node biopsy negative. She will follow up with Dr Baruch Gouty for [place] next this week 2/13 and then return to Dr Dahlia Byes surgery with future plan for lumpectomy.   Depression screen [facility] 2/9 03/13/2018 01/11/2018 12/16/2017  Decreased Interest 0 0 0  Down, Depressed, Hopeless 0 0 0  PHQ - 2 Score 0 0 0    Social History   Tobacco Use    . Smoking status: Never Smoker  . Smokeless tobacco: Never Used  Substance Use Topics  . Alcohol use: Never    Frequency: Never  . Drug use: Never    Review of Systems Per HPI unless specifically indicated above     Objective:    BP (!) 116/48   Pulse 68   Temp 98.1 F (36.7 C) (Oral)   Resp 16   Ht 5' (1.524 m)   Wt 159 lb 6.4 oz (72.3 kg)   BMI 31.13 kg/m   Wt Readings from Last 3 Encounters:  03/13/18 159 lb 6.4 oz (72.3 kg)  03/07/18 156 lb (70.8 kg)  02/16/18 156 lb (70.8 kg)    Physical Exam Vitals signs and nursing note reviewed.  Constitutional:      General: She is not in acute distress.    Appearance: She is well-developed. She is not diaphoretic.  Abdominal:     General: Bowel sounds are normal. There is no distension.     Palpations: Abdomen is soft.     Tenderness: There is no abdominal tenderness.  Musculoskeletal:     Comments: LEFT Shoulder Inspection: Normal appearance bilateral symmetrical Palpation: Non tender to palpation ROM: Severely limited AROM, unable to lift forward flexion or abduction due to pain. Passive ROM mostly intact but still limited pain at higher motion arc.  Special Testing: Rotator cuff testing unable to be completed due to pain   Strength: Distal Normal strength 5/5 grip, bicep flex/ext, supination/pronation wrist - cannot test shoulder  rotator cuff  Neurovascular: Distally intact pulses, sensation to light touch   RIGHT Shoulder Inspection: Normal appearance bilateral symmetrical Palpation: Non tender to palpation ROM: Moderately limited AROM, can lift forward flex to 90* and abduction. Has limited internal rotation  Special Testing: Rotator cuff testing unable to be completed due to pain and range of motion  Strength: Distal Normal strength 5/5 grip, bicep flex/ext, supination/pronation wrist - cannot test shoulder rotator cuff  Neurovascular: Distally intact pulses, sensation to light touch  Skin:    General:  Skin is warm and dry.    ________________________________________________________ PROCEDURE NOTE Date: 03/13/18 Bilateral Shoulder Subacromial injection Discussed benefits and risks (including pain, bleeding, infection, steroid flare). Verbal consent given by patient. Medication:  EACH SHOULDER - 1 cc Depo-medrol 40mg  and 4 cc Lidocaine 1% without epi Time Out taken  Landmarks identified. Area cleansed with alcohol wipes. Using 21 gauge and 1, 1/2 inch needle, first the RIGHT subacromial bursa space was injected (with above listed medication) via posterior approach cold spray used for superficial anesthetic. Then the LEFT subacromial bursa space was injected (with above listed medication) via posterior approach cold spray used for superficial anesthetic. Sterile bandage placed on each. Patient tolerated procedure well without bleeding or paresthesias. No complications.   Results for orders placed or performed during the hospital encounter of 03/07/18  Glucose, capillary  Result Value Ref Range   Glucose-Capillary 93 70 - 99 mg/dL  Surgical pathology  Result Value Ref Range   SURGICAL PATHOLOGY      Surgical Pathology CASE: [id number] PATIENT: Ellen [name] Surgical Pathology Report     SPECIMEN SUBMITTED: A. Sentinel lymph node #1  CLINICAL HISTORY: 85 year-old female with invasive mammary carcinoma  PRE-OPERATIVE DIAGNOSIS: Breast cancer  POST-OPERATIVE DIAGNOSIS: Same as pre op     DIAGNOSIS: A.  SENTINEL LYMPH NODE #1; EXCISION: - ONE LYMPH NODE NEGATIVE FOR CARCINOMA (0/1). - FOCAL DERMATOPATHIC LYMPHADENITIS.  GROSS DESCRIPTION: A. Labeled: Sentinel lymph node #1 Received: Formalin Tissue fragment(s): 1 Size: 3.8 x 1.4 x 0.5 cm Description: Indurated, fibroadipose tissue, serially sectioned to reveal a tan-white homogeneous cut surface and a possible lymph node candidate. Entirely submitted in A1-A3.    Final Diagnosis performed by Quay Burow,  MD.   Electronically signed 03/09/2018 10:29:59AM The electronic signature indicates that the named Attending Pathologist has evaluated the specimen  Technical component performed at [facility], [address] [place], [place] [address] Lab: [phone] Dir: Rush Farmer, MD, MMM  Professional component performed at [facility], [facility], [place], [place], [place] [address] Lab: [phone] Dir: Dellia Nims. Rubinas, MD       Assessment & Plan:   Problem List Items Addressed This Visit    Chronic pain of both shoulders    Bilateral shoulder pain, L>R with known rotator cuff arthropathy See A&P for etiology of pain - Treat with injection Follow-up if not improving      Relevant Medications   lidocaine (PF) (XYLOCAINE) 1 % injection 4 mL (Completed)   methylPREDNISolone acetate (DEPO-MEDROL) injection 40 mg (Completed)   lidocaine (PF) (XYLOCAINE) 1 % injection 4 mL (Completed)   methylPREDNISolone acetate (DEPO-MEDROL) injection 40 mg (Completed)   Primary cancer of upper outer quadrant of left female breast (HCC)    Followed by current team at [facility] and Gen Surgery Dr Dahlia Byes and Dr Baruch Gouty Rad-Onc S/p biopsy diagnosis S/p sentinel lymph node biopsy negative Next due for L lumpectomy followed by additional treatments  Relevant Medications   methylPREDNISolone acetate (DEPO-MEDROL) injection 40 mg (Completed)   methylPREDNISolone acetate (DEPO-MEDROL) injection 40 mg (Completed)   Rotator cuff arthropathy of left shoulder - Primary    Consistent with subacute on chronic L>R shoulder chronic shoulder pain and bursitis with known LEFT rotator cuff tendinopathy with significant reduced active ROM but without significant evidence of muscle tear (no weakness).  - 85 yr old patient with likely underlying arthritis - No recent imaging shoulders on chart - Prior injection subacromial last for >3 months  Plan: Right and Left shoulder  subacromial steroid injection performed today, see procedure note for details.  May take Tylenol Ex Str 1-2 q 6 hr PRN Relative rest but keep shoulder mobile, demonstrated ROM exercises, avoid heavy lifting May try heating pad PRN Follow-up 4-6 weeks if not improved for re-evaluation, consider referral to Physical Therapy vs Orthopedic if not improving  Follow in 3 months as scheduled, may repeat injection.      Relevant Medications   lidocaine (PF) (XYLOCAINE) 1 % injection 4 mL (Completed)   methylPREDNISolone acetate (DEPO-MEDROL) injection 40 mg (Completed)   lidocaine (PF) (XYLOCAINE) 1 % injection 4 mL (Completed)   methylPREDNISolone acetate (DEPO-MEDROL) injection 40 mg (Completed)    Other Visit Diagnoses    Incontinence of feces, unspecified fecal incontinence type          Clinically x 1 episode incontinence, runny loose stool, thought to be related to recent contrast dye and acute GI symptoms, now resolved. - Has well formed stools now, and asymptomatic - Encourage maintain fiber in diet to bulk stool Follow up if worsening or recurrence or new pattern  Meds ordered this encounter  Medications  . lidocaine (PF) (XYLOCAINE) 1 % injection 4 mL  . methylPREDNISolone acetate (DEPO-MEDROL) injection 40 mg  . lidocaine (PF) (XYLOCAINE) 1 % injection 4 mL  . methylPREDNISolone acetate (DEPO-MEDROL) injection 40 mg      Follow up plan: Return in about 3 months (around 06/11/2018) for DM A1c, Shoulder Pain Arthritis.   [name], [facility] 03/13/2018, 8:13 AM

## 2018-03-15 ENCOUNTER — Telehealth: Payer: Self-pay | Admitting: Radiation Oncology

## 2018-03-15 NOTE — Telephone Encounter (Signed)
Received a scheduling message from triage that the patient need to r\s her appt on 03/16/18. Information was forwarded to Vicente Males in [employer]. She has been unable to reach pt by phone and VM is set-up to accept messages.

## 2018-03-16 ENCOUNTER — Ambulatory Visit: Payer: Medicare Other | Admitting: Radiation Oncology

## 2018-03-20 ENCOUNTER — Telehealth: Payer: Self-pay | Admitting: Oncology

## 2018-03-20 ENCOUNTER — Telehealth: Payer: Self-pay | Admitting: *Deleted

## 2018-03-20 NOTE — Telephone Encounter (Signed)
Called reporting she needs to changed her appointment for 4/13 [place]. Message sent to schedulers

## 2018-03-20 NOTE — Telephone Encounter (Signed)
Pt called to reschedule appt for Dr. Dahlia Byes. I gave patient the number for his office. Kept appts for April the same.

## 2018-03-22 ENCOUNTER — Encounter: Payer: Medicare Other | Admitting: Surgery

## 2018-03-23 ENCOUNTER — Telehealth: Payer: Self-pay | Admitting: Genetic Counselor

## 2018-03-23 ENCOUNTER — Encounter: Payer: Self-pay | Admitting: Genetic Counselor

## 2018-03-23 NOTE — Telephone Encounter (Signed)
A genetic counseling appt has been scheduled for the pt to see Steele Berg on 3/19 at 11am. Letter mailed to the pt.

## 2018-03-29 ENCOUNTER — Other Ambulatory Visit: Payer: Self-pay

## 2018-03-29 ENCOUNTER — Ambulatory Visit (INDEPENDENT_AMBULATORY_CARE_PROVIDER_SITE_OTHER): Payer: Medicare Other | Admitting: Surgery

## 2018-03-29 ENCOUNTER — Ambulatory Visit
Admission: RE | Admit: 2018-03-29 | Discharge: 2018-03-29 | Disposition: A | Discharge status: Home / Self Care | Payer: Medicare Other | Source: Ambulatory Visit | Attending: Radiation Oncology | Admitting: Radiation Oncology

## 2018-03-29 ENCOUNTER — Encounter: Payer: Self-pay | Admitting: Surgery

## 2018-03-29 ENCOUNTER — Encounter: Payer: Self-pay | Admitting: Radiation Oncology

## 2018-03-29 VITALS — BP 127/71 | HR 74 | Temp 96.9°F | Resp 16 | Wt 157.5 lb

## 2018-03-29 VITALS — BP 161/71 | HR 79 | Temp 97.5°F | Resp 16 | Ht 60.0 in | Wt 157.8 lb

## 2018-03-29 DIAGNOSIS — Z09 Encounter for follow-up examination after completed treatment for conditions other than malignant neoplasm: Secondary | ICD-10-CM

## 2018-03-29 DIAGNOSIS — Z170 Estrogen receptor positive status [ER+]: Secondary | ICD-10-CM | POA: Insufficient documentation

## 2018-03-29 DIAGNOSIS — D0512 Intraductal carcinoma in situ of left breast: Secondary | ICD-10-CM | POA: Insufficient documentation

## 2018-03-29 NOTE — Patient Instructions (Signed)
Patient will need to return to the office as needed.    Call the office with any questions or concerns. 

## 2018-03-29 NOTE — Progress Notes (Signed)
S/p SLNB Path d/w pt and family in detail. No met disease She is to start radiation No complaints at this time No fevers, no pain  PE NAD Incision healing well, no infection, no Lymphedema. No seromas  A/p Doing very well Adjuvant radiation rx Oncology f/u No surgical issues at this time

## 2018-03-29 NOTE — Progress Notes (Signed)
Radiation Oncology Follow up Note  Name: Ellen [name]   Date:   03/29/2018 MRN:  [id number] DOB: 06/17/1929    This 85 y.o. female presents to the clinic today for reevaluation status post sentinel lymph node biopsy which was negative in patient with small invasive mammary carcinoma left breast status post wide local excision.  REFERRING PROVIDER: Karamalegos, Alexander *  HPI: atient is an 85-year-old female who I originally consult back in January 2020. She regionally presented with an abnormal mammogram was found to have architectural distortion the upper outer quadrant of the left breast and is axillary lymph node would focal cortical thickening. Protected guided biopsy was performed showing ductal carcinoma in situ. Patient underwent wide local excision showing a 9 mm focus of invasive mammary carcinoma overall grade 2 with margins clear at 8 mm. No lymph node was cemented she went back and had a sentinel lymph node biopsy which was negative for malignancy. The tumor is ER/PR positive HER-2/neu not overexpressed. She is seen today for consideration of whole breast radiation. She is doing well. She specifically denies breast tenderness cough or bone pain.  COMPLICATIONS OF TREATMENT: none  FOLLOW UP COMPLIANCE: keeps appointments   PHYSICAL EXAM:  BP 127/71 (BP Location: Left Arm, Patient Position: Sitting)   Pulse 74   Temp (!) 96.9 F (36.1 C) (Tympanic)   Resp 16   Wt 157 lb 8.3 oz (71.5 kg)   BMI 30.76 kg/m  Left breast is wide local excision scar which is healing well. No dominant mass or nodularity is noted in either breast in 2 positions examined. She has a small seroma near her lumpectomy site. No axillary or supraclavicular adenopathy is appreciated. Well-developed well-nourished patient in NAD. HEENT reveals PERLA, EOMI, discs not visualized.  Oral cavity is clear. No oral mucosal lesions are identified. Neck is clear without evidence of cervical or supraclavicular  adenopathy. Lungs are clear to A&P. Cardiac examination is essentially unremarkable with regular rate and rhythm without murmur rub or thrill. Abdomen is benign with no organomegaly or masses noted. Motor sensory and DTR levels are equal and symmetric in the upper and lower extremities. Cranial nerves II through XII are grossly intact. Proprioception is intact. No peripheral adenopathy or edema is identified. No motor or sensory levels are noted. Crude visual fields are within normal range.  RADIOLOGY RESULTS: mammograms and ultrasound reviewed  PLAN: present time I like to go ahead with hypofractionated course of radiation therapy and 16 fractions. Also would boost her scar another1400 cGy using electron beam. Risks and benefits of treatment including skin reaction fatigue alteration of blood counts possible inclusion of superficial lung all were discussed in detail with the patient. She seems to comprehend my treatment plan well. I have personally set up and ordered CT simulation for early next week.  I would like to take this opportunity to thank you for allowing me to participate in the care of your patient..    Glenn Chrystal, MD   

## 2018-04-03 ENCOUNTER — Ambulatory Visit
Admission: RE | Admit: 2018-04-03 | Discharge: 2018-04-03 | Disposition: A | Discharge status: Home / Self Care | Payer: Medicare Other | Source: Ambulatory Visit | Attending: Radiation Oncology | Admitting: Radiation Oncology

## 2018-04-03 DIAGNOSIS — D0512 Intraductal carcinoma in situ of left breast: Secondary | ICD-10-CM | POA: Diagnosis not present

## 2018-04-03 DIAGNOSIS — Z510 Encounter for antineoplastic radiation therapy: Secondary | ICD-10-CM | POA: Diagnosis not present

## 2018-04-03 DIAGNOSIS — Z170 Estrogen receptor positive status [ER+]: Secondary | ICD-10-CM | POA: Diagnosis not present

## 2018-04-03 DIAGNOSIS — C50412 Malignant neoplasm of upper-outer quadrant of left female breast: Secondary | ICD-10-CM | POA: Insufficient documentation

## 2018-04-04 DIAGNOSIS — C50412 Malignant neoplasm of upper-outer quadrant of left female breast: Secondary | ICD-10-CM | POA: Diagnosis not present

## 2018-04-04 DIAGNOSIS — Z170 Estrogen receptor positive status [ER+]: Secondary | ICD-10-CM | POA: Diagnosis not present

## 2018-04-04 DIAGNOSIS — D0512 Intraductal carcinoma in situ of left breast: Secondary | ICD-10-CM | POA: Diagnosis not present

## 2018-04-04 DIAGNOSIS — Z510 Encounter for antineoplastic radiation therapy: Secondary | ICD-10-CM | POA: Diagnosis not present

## 2018-04-07 ENCOUNTER — Other Ambulatory Visit: Payer: Self-pay | Admitting: *Deleted

## 2018-04-07 DIAGNOSIS — D0512 Intraductal carcinoma in situ of left breast: Secondary | ICD-10-CM

## 2018-04-09 ENCOUNTER — Emergency Department: Payer: Medicare Other

## 2018-04-09 ENCOUNTER — Other Ambulatory Visit: Payer: Self-pay

## 2018-04-09 ENCOUNTER — Observation Stay
Admission: EM | Admit: 2018-04-09 | Discharge: 2018-04-10 | Disposition: A | Discharge status: Home / Self Care | Payer: Medicare Other | Attending: Internal Medicine | Admitting: Internal Medicine

## 2018-04-09 DIAGNOSIS — I700 Atherosclerosis of aorta: Secondary | ICD-10-CM | POA: Diagnosis not present

## 2018-04-09 DIAGNOSIS — R109 Unspecified abdominal pain: Secondary | ICD-10-CM

## 2018-04-09 DIAGNOSIS — Z79899 Other long term (current) drug therapy: Secondary | ICD-10-CM | POA: Diagnosis not present

## 2018-04-09 DIAGNOSIS — E042 Nontoxic multinodular goiter: Secondary | ICD-10-CM | POA: Insufficient documentation

## 2018-04-09 DIAGNOSIS — I10 Essential (primary) hypertension: Secondary | ICD-10-CM | POA: Diagnosis not present

## 2018-04-09 DIAGNOSIS — Z885 Allergy status to narcotic agent status: Secondary | ICD-10-CM | POA: Diagnosis not present

## 2018-04-09 DIAGNOSIS — I272 Pulmonary hypertension, unspecified: Secondary | ICD-10-CM | POA: Insufficient documentation

## 2018-04-09 DIAGNOSIS — Z7982 Long term (current) use of aspirin: Secondary | ICD-10-CM | POA: Insufficient documentation

## 2018-04-09 DIAGNOSIS — C50919 Malignant neoplasm of unspecified site of unspecified female breast: Secondary | ICD-10-CM | POA: Diagnosis not present

## 2018-04-09 DIAGNOSIS — Z8041 Family history of malignant neoplasm of ovary: Secondary | ICD-10-CM | POA: Diagnosis not present

## 2018-04-09 DIAGNOSIS — R0902 Hypoxemia: Secondary | ICD-10-CM | POA: Diagnosis not present

## 2018-04-09 DIAGNOSIS — I129 Hypertensive chronic kidney disease with stage 1 through stage 4 chronic kidney disease, or unspecified chronic kidney disease: Secondary | ICD-10-CM | POA: Diagnosis not present

## 2018-04-09 DIAGNOSIS — R1084 Generalized abdominal pain: Secondary | ICD-10-CM | POA: Diagnosis not present

## 2018-04-09 DIAGNOSIS — Z801 Family history of malignant neoplasm of trachea, bronchus and lung: Secondary | ICD-10-CM | POA: Diagnosis not present

## 2018-04-09 DIAGNOSIS — Z8249 Family history of ischemic heart disease and other diseases of the circulatory system: Secondary | ICD-10-CM | POA: Insufficient documentation

## 2018-04-09 DIAGNOSIS — N189 Chronic kidney disease, unspecified: Secondary | ICD-10-CM | POA: Diagnosis not present

## 2018-04-09 DIAGNOSIS — K529 Noninfective gastroenteritis and colitis, unspecified: Principal | ICD-10-CM | POA: Diagnosis present

## 2018-04-09 DIAGNOSIS — E860 Dehydration: Secondary | ICD-10-CM | POA: Diagnosis not present

## 2018-04-09 DIAGNOSIS — R1032 Left lower quadrant pain: Secondary | ICD-10-CM | POA: Diagnosis not present

## 2018-04-09 LAB — COMPREHENSIVE METABOLIC PANEL
ALBUMIN: 3.1 g/dL — AB (ref 3.5–5.0)
ALT: 9 U/L (ref 0–44)
AST: 24 U/L (ref 15–41)
Alkaline Phosphatase: 42 U/L (ref 38–126)
Anion gap: 9 (ref 5–15)
BILIRUBIN TOTAL: 0.9 mg/dL (ref 0.3–1.2)
BUN: 19 mg/dL (ref 8–23)
CO2: 25 mmol/L (ref 22–32)
Calcium: 8.5 mg/dL — ABNORMAL LOW (ref 8.9–10.3)
Chloride: 106 mmol/L (ref 98–111)
Creatinine, Ser: 1.1 mg/dL — ABNORMAL HIGH (ref 0.44–1.00)
GFR calc Af Amer: 53 mL/min — ABNORMAL LOW (ref >60)
GFR calc non Af Amer: 45 mL/min — ABNORMAL LOW (ref >60)
GLUCOSE: 112 mg/dL — AB (ref 70–99)
POTASSIUM: 3.9 mmol/L (ref 3.5–5.1)
Sodium: 140 mmol/L (ref 135–145)
Total Protein: 7.1 g/dL (ref 6.5–8.1)

## 2018-04-09 LAB — CBC
HCT: 39.8 % (ref 36.0–46.0)
Hemoglobin: 12.6 g/dL (ref 12.0–15.0)
MCH: 26.5 pg (ref 26.0–34.0)
MCHC: 31.7 g/dL (ref 30.0–36.0)
MCV: 83.8 fL (ref 80.0–100.0)
PLATELETS: 293 10*3/uL (ref 150–400)
RBC: 4.75 MIL/uL (ref 3.87–5.11)
RDW: 17.9 % — ABNORMAL HIGH (ref 11.5–15.5)
WBC: 7 10*3/uL (ref 4.0–10.5)
nRBC: 0 % (ref 0.0–0.2)

## 2018-04-09 LAB — URINALYSIS, COMPLETE (UACMP) WITH MICROSCOPIC
Bacteria, UA: NONE SEEN
Bilirubin Urine: NEGATIVE
Glucose, UA: NEGATIVE mg/dL
Hgb urine dipstick: NEGATIVE
Ketones, ur: 20 mg/dL — AB
Leukocytes,Ua: NEGATIVE
Nitrite: NEGATIVE
Protein, ur: NEGATIVE mg/dL
Specific Gravity, Urine: 1.03 (ref 1.005–1.030)
pH: 8 (ref 5.0–8.0)

## 2018-04-09 LAB — LIPASE, BLOOD: Lipase: 31 U/L (ref 11–51)

## 2018-04-09 LAB — LACTIC ACID, PLASMA: Lactic Acid, Venous: 1.3 mmol/L (ref 0.5–1.9)

## 2018-04-09 MED ORDER — ACETAMINOPHEN 325 MG PO TABS: 650.0000 mg | ORAL_TABLET | Freq: Four times a day (QID) | ORAL | Status: DC | PRN

## 2018-04-09 MED ORDER — IOHEXOL 300 MG/ML  SOLN
100.0000 mL | Freq: Once | INTRAMUSCULAR | Status: AC | PRN
  Administered 2018-04-09: 100 mL via INTRAVENOUS

## 2018-04-09 MED ORDER — ACETAMINOPHEN 500 MG PO TABS
1000.0000 mg | ORAL_TABLET | Freq: Once | ORAL | Status: AC
  Administered 2018-04-09: 1000 mg via ORAL
  Filled 2018-04-09: qty 2

## 2018-04-09 MED ORDER — SODIUM CHLORIDE 0.9 % IV BOLUS
500.0000 mL | Freq: Once | INTRAVENOUS | Status: AC
  Administered 2018-04-09: 500 mL via INTRAVENOUS

## 2018-04-09 MED ORDER — ONDANSETRON HCL 4 MG/2ML IJ SOLN: 4.0000 mg | Freq: Four times a day (QID) | INTRAMUSCULAR | Status: DC | PRN

## 2018-04-09 MED ORDER — SODIUM CHLORIDE 0.9% FLUSH: 3.0000 mL | Freq: Once | INTRAVENOUS | Status: DC

## 2018-04-09 MED ORDER — HYDROCODONE-ACETAMINOPHEN 5-325 MG PO TABS: 1.0000 | ORAL_TABLET | ORAL | Status: DC | PRN

## 2018-04-09 MED ORDER — ONDANSETRON HCL 4 MG/2ML IJ SOLN
4.0000 mg | Freq: Once | INTRAMUSCULAR | Status: AC
  Administered 2018-04-09: 4 mg via INTRAVENOUS
  Filled 2018-04-09: qty 2

## 2018-04-09 MED ORDER — FENTANYL CITRATE (PF) 100 MCG/2ML IJ SOLN
50.0000 ug | Freq: Once | INTRAMUSCULAR | Status: AC
  Administered 2018-04-09: 50 ug via INTRAVENOUS
  Filled 2018-04-09: qty 2

## 2018-04-09 MED ORDER — NALOXONE HCL 2 MG/2ML IJ SOSY
0.4000 mg | PREFILLED_SYRINGE | Freq: Once | INTRAMUSCULAR | Status: DC
  Filled 2018-04-09: qty 2

## 2018-04-09 MED ORDER — PIPERACILLIN-TAZOBACTAM 3.375 G IVPB
3.3750 g | Freq: Once | INTRAVENOUS | Status: AC
  Administered 2018-04-09: 3.375 g via INTRAVENOUS
  Filled 2018-04-09: qty 50

## 2018-04-09 MED ORDER — SODIUM CHLORIDE 0.9 % IV SOLN
INTRAVENOUS | Status: DC
  Administered 2018-04-09 – 2018-04-10 (×2): via INTRAVENOUS

## 2018-04-09 MED ORDER — ACETAMINOPHEN 650 MG RE SUPP: 650.0000 mg | Freq: Four times a day (QID) | RECTAL | Status: DC | PRN

## 2018-04-09 MED ORDER — MORPHINE SULFATE (PF) 2 MG/ML IV SOLN: 2.0000 mg | INTRAVENOUS | Status: DC | PRN

## 2018-04-09 MED ORDER — ONDANSETRON HCL 4 MG PO TABS: 4.0000 mg | ORAL_TABLET | Freq: Four times a day (QID) | ORAL | Status: DC | PRN

## 2018-04-09 MED ORDER — ENOXAPARIN SODIUM 40 MG/0.4ML ~~LOC~~ SOLN
40.0000 mg | SUBCUTANEOUS | Status: DC
  Administered 2018-04-09: 40 mg via SUBCUTANEOUS
  Filled 2018-04-09: qty 0.4

## 2018-04-09 MED ORDER — AMLODIPINE BESYLATE 5 MG PO TABS
5.0000 mg | ORAL_TABLET | Freq: Every day | ORAL | Status: DC
  Administered 2018-04-09 – 2018-04-10 (×2): 5 mg via ORAL
  Filled 2018-04-09 (×2): qty 1

## 2018-04-09 MED ORDER — SENNOSIDES-DOCUSATE SODIUM 8.6-50 MG PO TABS: 1.0000 | ORAL_TABLET | Freq: Every evening | ORAL | Status: DC | PRN

## 2018-04-09 NOTE — Consult Note (Signed)
Reason for Consult:?peritonitis Referring Physician: Jacqualine Code, ED  Ellen [name] is an 85 y.o. female.  HPI: She presented to the emergency department today with abdominal pain.  She states that it started yesterday afternoon and got worse today prompting her arrival in the emergency room.  She had a similar presentation in January.  At that time, she was not felt to have a surgical process.  Today, she states that the pain is almost identical to to what she was experiencing in January.  She does endorse some nausea today.  She denies blood in her stool or diarrhea.  The pain is primarily in the left lower quadrant and migrates to the right lower quadrant.  She says that it is crampy in nature.  Labs and imaging were performed in the emergency department.  I independently reviewed the films and the labs.  Specifically, she has a normal white blood cell count, normal lactic acid, and her CT scan actually demonstrates improvement from her prior imaging.  Notably, despite extensive atherosclerosis, the mesenteric vessels are patent.  Past Medical History:  Diagnosis Date  . Arthritis   . Cancer ([place]) 01/2018   left breast   . Cataract   . Chronic headaches   . CKD (chronic kidney disease)   . Diverticulitis   . History of syncope   . Hypertension   . Multinodular goiter   . Primary osteoarthritis of left knee   . Pulmonary hypertension ([place])     Past Surgical History:  Procedure Laterality Date  . ABDOMINAL HYSTERECTOMY    . APPENDECTOMY    . BREAST BIOPSY Left 01/03/2018   left breast stereo/x clip/path pending  . BREAST LUMPECTOMY Left 01/12/2018  . BREAST LUMPECTOMY WITH NEEDLE LOCALIZATION Left 01/12/2018   Procedure: BREAST LUMPECTOMY WITH NEEDLE LOCALIZATION;  Surgeon: Jules Husbands, MD;  Location: [facility] ORS;  Service: General;  Laterality: Left;  . CHOLECYSTECTOMY    . COLON SURGERY    . COLOSTOMY  2008  . COLOSTOMY REVERSAL  2008  . EYE SURGERY    . REPLACEMENT TOTAL KNEE Right  2018  . SENTINEL NODE BIOPSY Left 03/07/2018   Procedure: SENTINEL NODE BIOPSY;  Surgeon: Jules Husbands, MD;  Location: [facility] ORS;  Service: General;  Laterality: Left;    Family History  Problem Relation Age of Onset  . Lung cancer Mother   . Diabetes Father   . Stroke Father   . Hypertension Maternal Grandmother   . Hypertension Maternal Uncle   . Hypertension Maternal Aunt   . Ovarian cancer Maternal Aunt   . Heart disease Daughter     Social History:  reports that she has never smoked. She has never used smokeless tobacco. She reports that she does not drink alcohol or use drugs.  Allergies:  Allergies  Allergen Reactions  . Codeine Nausea Only  . Morphine And Related Nausea Only    Medications: I have reviewed the patient's current medications.  Results for orders placed or performed during the hospital encounter of 04/09/18 (from the past 48 hour(s))  CBC     Status: Abnormal   Collection Time: 04/09/18 12:35 PM  Result Value Ref Range   WBC 7.0 4.0 - 10.5 K/uL   RBC 4.75 3.87 - 5.11 MIL/uL   Hemoglobin 12.6 12.0 - 15.0 g/dL   HCT 39.8 36.0 - 46.0 %   MCV 83.8 80.0 - 100.0 fL   MCH 26.5 26.0 - 34.0 pg   MCHC 31.7 30.0 - 36.0 g/dL  RDW 17.9 (H) 11.5 - 15.5 %   Platelets 293 150 - 400 K/uL   nRBC 0.0 0.0 - 0.2 %    Comment: Performed at [facility], [address] [place], [place] [address]  Lactic acid, plasma     Status: None   Collection Time: 04/09/18 12:36 PM  Result Value Ref Range   Lactic Acid, Venous 1.3 0.5 - 1.9 mmol/L    Comment: Performed at [facility], [address] [place], [place] [address]  Urinalysis, Complete w Microscopic     Status: Abnormal   Collection Time: 04/09/18  1:41 PM  Result Value Ref Range   Color, Urine STRAW (A) YELLOW   APPearance CLEAR (A) CLEAR   Specific Gravity, Urine 1.030 1.005 - 1.030   pH 8.0 5.0 - 8.0   Glucose, UA NEGATIVE NEGATIVE mg/dL   Hgb urine dipstick NEGATIVE NEGATIVE    Bilirubin Urine NEGATIVE NEGATIVE   Ketones, ur 20 (A) NEGATIVE mg/dL   Protein, ur NEGATIVE NEGATIVE mg/dL   Nitrite NEGATIVE NEGATIVE   Leukocytes,Ua NEGATIVE NEGATIVE   RBC / HPF 0-5 0 - 5 RBC/hpf   WBC, UA 0-5 0 - 5 WBC/hpf   Bacteria, UA NONE SEEN NONE SEEN   Squamous Epithelial / LPF 0-5 0 - 5   Mucus PRESENT     Comment: Performed at [facility], [address] [place], [place] [address]  Comprehensive metabolic panel     Status: Abnormal   Collection Time: 04/09/18  1:41 PM  Result Value Ref Range   Sodium 140 135 - 145 mmol/L   Potassium 3.9 3.5 - 5.1 mmol/L   Chloride 106 98 - 111 mmol/L   CO2 25 22 - 32 mmol/L   Glucose, Bld 112 (H) 70 - 99 mg/dL   BUN 19 8 - 23 mg/dL   Creatinine, Ser 1.10 (H) 0.44 - 1.00 mg/dL   Calcium 8.5 (L) 8.9 - 10.3 mg/dL   Total Protein 7.1 6.5 - 8.1 g/dL   Albumin 3.1 (L) 3.5 - 5.0 g/dL   AST 24 15 - 41 U/L   ALT 9 0 - 44 U/L   Alkaline Phosphatase 42 38 - 126 U/L   Total Bilirubin 0.9 0.3 - 1.2 mg/dL   GFR calc non Af Amer 45 (L) >60 mL/min   GFR calc Af Amer 53 (L) >60 mL/min   Anion gap 9 5 - 15    Comment: Performed at [facility], [address] [place], [place] [address]  Lipase, blood     Status: None   Collection Time: 04/09/18  1:41 PM  Result Value Ref Range   Lipase 31 11 - 51 U/L    Comment: Performed at [facility], [address], [place],  [address]    Ct Abdomen Pelvis W Contrast  Result Date: 04/09/2018 CLINICAL DATA:  Abdominal pain. History of diverticulitis. Borderline diabetes. Appendectomy. Hysterectomy. Recent surgery for left breast cancer and lymph node removal. EXAM: CT ABDOMEN AND PELVIS WITH CONTRAST TECHNIQUE: Multidetector CT imaging of the abdomen and pelvis was performed using the standard protocol following bolus administration of intravenous contrast. CONTRAST:  164mL OMNIPAQUE IOHEXOL 300 MG/ML  SOLN COMPARISON:  02/16/2018 FINDINGS: Lower chest: Left base  scarring. Normal heart size without pericardial or pleural effusion. Tiny hiatal hernia. Hepatobiliary: Normal liver. Cholecystectomy. Upper normal intrahepatic ducts, similar. No common duct dilatation. Pancreas: Normal, without mass or ductal dilatation. Spleen: Splenectomy. Adrenals/Urinary Tract: Normal adrenal glands. Mild renal  cortical scarring bilaterally. The interpolar left renal 9 mm lesion on the prior exam is again identified on image 12/7. No hydronephrosis. Normal urinary bladder. Stomach/Bowel: Normal stomach, without wall thickening. Left hemicolectomy. Colonic stool burden suggests constipation. The terminal ileum is decompressed. Multifocal areas of mid small bowel wall thickening, mesenteric edema, and fluid. Example in the left side of the abdomen on image 43/2 and a separate segment in the upper right pelvis on image 50/2. The severity of enteritis is felt to be decreased compared to 02/16/2018. No pneumatosis or free intraperitoneal air. no proximal bowel dilatation to suggest high-grade obstruction. Vascular/Lymphatic: Advanced aortic and branch vessel atherosclerosis. The proximal celiac and SMA are patent, with atherosclerosis within. Patent portal and superior mesenteric veins. No abdominopelvic adenopathy. Reproductive: Hysterectomy.  No adnexal mass. Other: Trace pelvic fluid on image 54/2. Musculoskeletal: Osteopenia. Degenerative sclerosis of the bilateral sacroiliac joints. Lumbosacral spondylosis. Convex right thoracolumbar spine curvature. IMPRESSION: 1. Moderate multifocal enteritis. Favor infection. Ischemia could look similar. Advanced aortic and branch vessel atherosclerosis, without proximal mesenteric thrombus. 2. Mesenteric and small volume pelvic fluid, presumably secondary. 3.  Aortic Atherosclerosis (ICD10-I70.0). 4. Left renal lesion as detailed previously. Consider outpatient evaluation with pre and post contrast abdominal MRI at 1 year. Electronically Signed   By: Abigail Miyamoto M.D.   On: 04/09/2018 15:08    Review of Systems  All other systems reviewed and are negative. or as per the HPI  Blood pressure 137/73, pulse 86, temperature (!) 97.5 F (36.4 C), temperature source Oral, resp. rate 11, height 5' (1.524 m), weight 71.6 kg, SpO2 94 %. Physical Exam  Constitutional: She is oriented to person, place, and time. No distress.  Elderly AA female.  HENT:  Head: Normocephalic and atraumatic.  Eyes: Pupils are equal, round, and reactive to light. Right eye exhibits no discharge. Left eye exhibits no discharge. No scleral icterus.  Neck: Normal range of motion. No tracheal deviation present.  Cardiovascular: Normal rate and regular rhythm.  Respiratory: Effort normal.  GI: Soft. She exhibits no distension. There is abdominal tenderness. There is guarding. There is no rebound.  Guarding is voluntary.   Genitourinary:    Genitourinary Comments: deferred   Musculoskeletal:        General: No edema.  Neurological: She is alert and oriented to person, place, and time.  Skin: Skin is warm and dry.  Psychiatric: She has a normal mood and affect.    Assessment/Plan:  This is an 85 year old female presenting to the emergency department with abdominal pain.  This is a virtually identical presentation to what was seen at her visit on 16 February 2018.  White blood cell count is normal.  Lactic acid is normal.  CT scan appears actually improved from her prior visit.  The mesenteric vasculature is patent.  There is no free air.  There is no pneumatosis.  There is no evidence of bowel obstruction.  I do not see any evidence of any surgical pathology.  She is being admitted to the medicine service for pain control.  The patient understands and agrees with this plan.  Surgery will sign off.   Fredirick Maudlin 04/09/2018, 5:07 PM

## 2018-04-09 NOTE — ED Notes (Signed)
Pt oxygen saturation dropped to mid 30's-40's after fentanyl administration. Pt placed on [place] and recovered. O2 now at 100% on 2L.

## 2018-04-09 NOTE — ED Provider Notes (Signed)
[facility] Emergency Department Provider Note ____________________________________________   First MD Initiated Contact with Patient 04/09/18 1258     (approximate)  I have reviewed the triage vital signs and the nursing notes.   HISTORY  Chief Complaint Abdominal Pain  HPI Ellen [name] is a 85 y.o. female here for evaluation of lower abdominal pain  Patient reports last night she started to have pain in her left lower abdomen, has become increasingly painful and severe now.  To the point of tears and into the point that she really cannot walk due to pain.  She had to call EMS to bring her  She also reports she has breast cancer, had recent lymph node resection and is planning to start radiation therapy tomorrow.  She reports some nausea but no vomiting.  No fever.  She reports she feels like she cannot pass gas or have a bowel movement.  She also has had previous surgery due to diverticulitis which felt similar.   Past Medical History:  Diagnosis Date  . Arthritis   . Cancer ([place]) 01/2018   left breast   . Cataract   . Chronic headaches   . CKD (chronic kidney disease)   . Diverticulitis   . History of syncope   . Hypertension   . Multinodular goiter   . Primary osteoarthritis of left knee   . Pulmonary hypertension [facility])     Patient Active Problem List   Diagnosis Date Noted  . Rotator cuff arthropathy of left shoulder 03/13/2018  . Chronic pain of both shoulders 03/13/2018  . Primary cancer of upper outer quadrant of left female breast ([place]) 01/08/2018  . Type 2 diabetes mellitus with diabetic nephropathy, without long-term current use of insulin ([place]) 11/10/2017  . Chronic headaches 11/10/2017  . Hyperlipidemia associated with type 2 diabetes mellitus ([place]) 11/10/2017  . Essential hypertension 11/10/2017  . Enteritis 09/18/2017    Past Surgical History:  Procedure Laterality Date  . ABDOMINAL HYSTERECTOMY    . APPENDECTOMY    .  BREAST BIOPSY Left 01/03/2018   left breast stereo/x clip/path pending  . BREAST LUMPECTOMY Left 01/12/2018  . BREAST LUMPECTOMY WITH NEEDLE LOCALIZATION Left 01/12/2018   Procedure: BREAST LUMPECTOMY WITH NEEDLE LOCALIZATION;  Surgeon: Jules Husbands, MD;  Location: [facility] ORS;  Service: General;  Laterality: Left;  . CHOLECYSTECTOMY    . COLON SURGERY    . COLOSTOMY  2008  . COLOSTOMY REVERSAL  2008  . EYE SURGERY    . REPLACEMENT TOTAL KNEE Right 2018  . SENTINEL NODE BIOPSY Left 03/07/2018   Procedure: SENTINEL NODE BIOPSY;  Surgeon: Jules Husbands, MD;  Location: [facility] ORS;  Service: General;  Laterality: Left;    Prior to Admission medications   Medication Sig Start Date End Date Taking? Authorizing Provider  amLODipine (NORVASC) 5 MG tablet Take 5 mg by mouth daily.  07/07/16  Yes [provider]  aspirin 81 MG chewable tablet Chew 81 mg by mouth daily.    Yes [provider]  gabapentin (NEURONTIN) 100 MG capsule Start 1 capsule daily, increase by 1 cap every 1 week as tolerated, next add 1 in evening, up to max 3 pills in evening Patient taking differently: Take 100 mg by mouth daily.  11/10/17  Yes Karamalegos, Devonne Doughty, DO  lisinopril-hydrochlorothiazide (PRINZIDE,ZESTORETIC) 20-12.5 MG tablet Take 1 tablet by mouth daily. 12/09/17  Yes Mikey College, NP  acetaminophen (TYLENOL) 500 MG tablet Take 1,000 mg by mouth every 6 (  six) hours as needed for moderate pain or headache.     [provider]  cloNIDine (CATAPRES) 0.1 MG tablet TAKE 1 TABLET BY MOUTH DAILY AS NEEDED FOR ELEVATED BLOOD PRESSURE GREATER THAN 180/100 11/10/17   Karamalegos, Devonne Doughty, DO  HYDROcodone-acetaminophen (NORCO/VICODIN) 5-325 MG tablet Take 1 tablet by mouth every 6 (six) hours as needed for moderate pain. Patient not taking: Reported on 04/09/2018 01/12/18   Caroleen Hamman F, MD  HYDROcodone-acetaminophen (NORCO/VICODIN) 5-325 MG tablet Take 1-2 tablets by mouth every 4  (four) hours as needed for moderate pain. Patient not taking: Reported on 04/09/2018 03/07/18   Caroleen Hamman F, MD  traMADol (ULTRAM) 50 MG tablet Take 1 tablet (50 mg total) by mouth every 6 (six) hours as needed. 02/16/18   Harvest Dark, MD    Allergies Codeine and Morphine and related  Family History  Problem Relation Age of Onset  . Lung cancer Mother   . Diabetes Father   . Stroke Father   . Hypertension Maternal Grandmother   . Hypertension Maternal Uncle   . Hypertension Maternal Aunt   . Ovarian cancer Maternal Aunt   . Heart disease Daughter     Social History Social History   Tobacco Use  . Smoking status: Never Smoker  . Smokeless tobacco: Never Used  Substance Use Topics  . Alcohol use: Never    Frequency: Never  . Drug use: Never    Review of Systems Constitutional: No fever/chills. Eyes: No visual changes. ENT: No sore throat. Cardiovascular: Denies chest pain. Respiratory: Denies shortness of breath. Gastrointestinal: See HPI.  She feels like if she was try to drink anything she would throw it back up.  Nausea, severe pain primarily in the left lower abdomen.  No diarrhea or bowel movement.  Has not vomited. Genitourinary: Negative for dysuria. Musculoskeletal: Negative for back pain. Skin: Negative for rash. Neurological: Negative for headaches, areas of focal weakness or numbness.    ____________________________________________   PHYSICAL EXAM:  VITAL SIGNS: ED Triage Vitals  Enc Vitals Group     BP 04/09/18 1237 (!) 152/69     Pulse Rate 04/09/18 1237 69     Resp 04/09/18 1237 16     Temp 04/09/18 1237 (!) 97.5 F (36.4 C)     Temp Source 04/09/18 1237 Oral     SpO2 04/09/18 1236 99 %     Weight 04/09/18 1236 157 lb 12.8 oz (71.6 kg)     Height 04/09/18 1236 5' (1.524 m)     Head Circumference --      Peak Flow --      Pain Score 04/09/18 1236 10     Pain Loc --      Pain Edu? --      Excl. in [place]? --     Constitutional: Alert  and oriented.  Tearful, appears in pain reports severe abdominal pain left lower.  Eyes: Conjunctivae are normal. Head: Atraumatic. Nose: No congestion/rhinnorhea. Mouth/Throat: Mucous membranes are lightly dry. Neck: No stridor.  Cardiovascular: Normal rate, regular rhythm. Grossly normal heart sounds.  Good peripheral circulation. Respiratory: Normal respiratory effort.  No retractions. Lungs CTAB. Gastrointestinal: Soft and exquisitely tender mid abdomen and left lower quadrant with some rebound discomfort and evidence of peritonitis  primarily noted in the lower abdomen, left greater than right. No distention. Musculoskeletal: No lower extremity tenderness nor edema. Neurologic:  Normal speech and language. No gross focal neurologic deficits are appreciated.  Skin:  Skin is warm, dry  and intact. No rash noted. Psychiatric: Mood and affect are normal. Speech and behavior are normal.  ____________________________________________   LABS (all labs ordered are listed, but only abnormal results are displayed)  Labs Reviewed  CBC - Abnormal; Notable for the following components:      Result Value   RDW 17.9 (*)    All other components within normal limits  URINALYSIS, COMPLETE (UACMP) WITH MICROSCOPIC - Abnormal; Notable for the following components:   Color, Urine STRAW (*)    APPearance CLEAR (*)    Ketones, ur 20 (*)    All other components within normal limits  COMPREHENSIVE METABOLIC PANEL - Abnormal; Notable for the following components:   Glucose, Bld 112 (*)    Creatinine, Ser 1.10 (*)    Calcium 8.5 (*)    Albumin 3.1 (*)    GFR calc non Af Amer 45 (*)    GFR calc Af Amer 53 (*)    All other components within normal limits  GASTROINTESTINAL PANEL BY PCR, STOOL (REPLACES STOOL CULTURE)  C DIFFICILE QUICK SCREEN W PCR REFLEX  LIPASE, BLOOD  LACTIC ACID, PLASMA  BASIC METABOLIC PANEL  CBC   ____________________________________________  EKG  Reviewed and entered by  me at 1240 Heart rate 75 QRS 80 QTc 440 Normal sinus rhythm without evidence of ischemia ____________________________________________  RADIOLOGY  Ct Abdomen Pelvis W Contrast  Result Date: 04/09/2018 CLINICAL DATA:  Abdominal pain. History of diverticulitis. Borderline diabetes. Appendectomy. Hysterectomy. Recent surgery for left breast cancer and lymph node removal. EXAM: CT ABDOMEN AND PELVIS WITH CONTRAST TECHNIQUE: Multidetector CT imaging of the abdomen and pelvis was performed using the standard protocol following bolus administration of intravenous contrast. CONTRAST:  112mL OMNIPAQUE IOHEXOL 300 MG/ML  SOLN COMPARISON:  02/16/2018 FINDINGS: Lower chest: Left base scarring. Normal heart size without pericardial or pleural effusion. Tiny hiatal hernia. Hepatobiliary: Normal liver. Cholecystectomy. Upper normal intrahepatic ducts, similar. No common duct dilatation. Pancreas: Normal, without mass or ductal dilatation. Spleen: Splenectomy. Adrenals/Urinary Tract: Normal adrenal glands. Mild renal cortical scarring bilaterally. The interpolar left renal 9 mm lesion on the prior exam is again identified on image 12/7. No hydronephrosis. Normal urinary bladder. Stomach/Bowel: Normal stomach, without wall thickening. Left hemicolectomy. Colonic stool burden suggests constipation. The terminal ileum is decompressed. Multifocal areas of mid small bowel wall thickening, mesenteric edema, and fluid. Example in the left side of the abdomen on image 43/2 and a separate segment in the upper right pelvis on image 50/2. The severity of enteritis is felt to be decreased compared to 02/16/2018. No pneumatosis or free intraperitoneal air. no proximal bowel dilatation to suggest high-grade obstruction. Vascular/Lymphatic: Advanced aortic and branch vessel atherosclerosis. The proximal celiac and SMA are patent, with atherosclerosis within. Patent portal and superior mesenteric veins. No abdominopelvic adenopathy.  Reproductive: Hysterectomy.  No adnexal mass. Other: Trace pelvic fluid on image 54/2. Musculoskeletal: Osteopenia. Degenerative sclerosis of the bilateral sacroiliac joints. Lumbosacral spondylosis. Convex right thoracolumbar spine curvature. IMPRESSION: 1. Moderate multifocal enteritis. Favor infection. Ischemia could look similar. Advanced aortic and branch vessel atherosclerosis, without proximal mesenteric thrombus. 2. Mesenteric and small volume pelvic fluid, presumably secondary. 3.  Aortic Atherosclerosis (ICD10-I70.0). 4. Left renal lesion as detailed previously. Consider outpatient evaluation with pre and post contrast abdominal MRI at 1 year. Electronically Signed   By: Abigail Miyamoto M.D.   On: 04/09/2018 15:08     CT scan results reviewed by me discussed with hospitalist as well as Dr. Celine Ahr (via [facility] messages) ____________________________________________  PROCEDURES  Procedure(s) performed: None  Procedures  Critical Care performed: No  ____________________________________________   INITIAL IMPRESSION / ASSESSMENT AND PLAN / ED COURSE  Pertinent labs & imaging results that were available during my care of the patient were reviewed by me and considered in my medical decision making (see chart for details).   Differential diagnosis includes but is not limited to, abdominal perforation, aortic dissection, cholecystitis, appendicitis, diverticulitis, colitis, esophagitis/gastritis, kidney stone, pyelonephritis, urinary tract infection, aortic aneurysm. All are considered in decision and treatment plan. Based upon the patient's presentation and risk factors, I am highly concerned about a possible acute bowel obstruction or perforation or diverticulitis as the patient exhibits peritonitis and reports no bowel movement, cannot pass gas and has a history of abdominal surgeries.   Clinical Course as of Apr 09 2250  Sun Apr 09, 2018  1523 Patient reports her pain is better now.  It  is intermittent and crampy.  She is resting more comfortably.  She continues to have notable pain on exam with some evidence of peritonitis.  For this reason I will admit her to the hospital for further observation.  We will also order stool studies at this time.  Patient is in agreement.   [MQ]    Clinical Course User Index [MQ] Delman Kitten, MD   Shortly after receiving fentanyl, patient had a brief episode of desaturation.  She was noted to have a desaturation with oxygen saturations in the 60s, nurses pick this up almost immediately on pulse oximetry monitoring and were able to go to the bedside and wake her to verbal and her oxygen saturation improved to the high 90s.  She has had no further episodes of hypoxia, she is fully awake and alert and reports her pain is much better now.  ----------------------------------------- 1:49 PM on 04/09/2018 -----------------------------------------  Patient continues to report pain well controlled.  Resting at this time awaiting CT scan.  Oxygen saturation 97%.  Patient rests comfortably, but on reexam after CT scan she continues to have abdominal pain to some extent and I am concerned that she may have some underlying peritonitis.  At her age I have requested consultation for further evaluation by general surgery and placed her for medicine consult as I feel with her symptomatology, CT findings, and age that she warrants observation and will initiate intra-abdominal antibiotic coverage pending stool studies and surgery consult recommendation ____________________________________________   FINAL CLINICAL IMPRESSION(S) / ED DIAGNOSES  Final diagnoses:  Abdominal pain, unspecified abdominal location  Peritonitis      Note:  This document was prepared using Dragon voice recognition software and may include unintentional dictation errors       Delman Kitten, MD 04/09/18 2252

## 2018-04-09 NOTE — ED Notes (Addendum)
ED TO INPATIENT HANDOFF REPORT  ED Nurse Name and Phone #: Karena Addison [phone]  S Name/Age/Gender Ellen [name] [place] 85 y.o. female Room/Bed: ED17A/ED17A  Code Status   Code Status: Prior  Home/SNF/Other Home Patient oriented to: self, place, time and situation Is this baseline? Yes   Triage Complete: Triage complete  Chief Complaint Abdominal Pain  Triage Note Pt arrived via [facility] with abd pain, LLQ. Pt has hx of diverticulitis, htn, and boarderline DM. No N,V, or diarrhea. Breast CA pt, chemo starts tomorrow.   Allergies Allergies  Allergen Reactions  . Codeine Nausea Only  . Morphine And Related Nausea Only    Level of Care/Admitting Diagnosis ED Disposition    ED Disposition Condition [place] [facility] Area: [place] [[id number]]  Level of Care: Med-Surg [16]  Diagnosis: Enteritis [[id number]]  Admitting Physician: Saundra Shelling [[id number]]  Attending Physician: Saundra Shelling [[id number]]  PT Class (Do Not Modify): Observation [104]  PT Acc Code (Do Not Modify): Observation [10022]       B Medical/Surgery History Past Medical History:  Diagnosis Date  . Arthritis   . Cancer ([place]) 01/2018   left breast   . Cataract   . Chronic headaches   . CKD (chronic kidney disease)   . Diverticulitis   . History of syncope   . Hypertension   . Multinodular goiter   . Primary osteoarthritis of left knee   . Pulmonary hypertension ([place])    Past Surgical History:  Procedure Laterality Date  . ABDOMINAL HYSTERECTOMY    . APPENDECTOMY    . BREAST BIOPSY Left 01/03/2018   left breast stereo/x clip/path pending  . BREAST LUMPECTOMY Left 01/12/2018  . BREAST LUMPECTOMY WITH NEEDLE LOCALIZATION Left 01/12/2018   Procedure: BREAST LUMPECTOMY WITH NEEDLE LOCALIZATION;  Surgeon: Jules Husbands, MD;  Location: [facility] ORS;  Service: General;  Laterality: Left;  . CHOLECYSTECTOMY    . COLON SURGERY    . COLOSTOMY  2008  . COLOSTOMY REVERSAL  2008  . EYE SURGERY     . REPLACEMENT TOTAL KNEE Right 2018  . SENTINEL NODE BIOPSY Left 03/07/2018   Procedure: SENTINEL NODE BIOPSY;  Surgeon: Jules Husbands, MD;  Location: [facility] ORS;  Service: General;  Laterality: Left;     A IV Location/Drains/Wounds Patient Lines/Drains/Airways Status   Active Line/Drains/Airways    Name:   Placement date:   Placement time:   Site:   Days:   Peripheral IV 04/09/18 Right Antecubital   04/09/18    1240    Antecubital   less than 1   External Urinary Catheter   09/18/17    0706    -   203   Incision (Closed) 01/12/18 Breast Left   01/12/18    1126     87   Incision (Closed) 03/07/18 Breast   03/07/18    1329     33          Intake/Output Last 24 hours No intake or output data in the 24 hours ending 04/09/18 1643  Labs/Imaging Results for orders placed or performed during the hospital encounter of 04/09/18 (from the past 48 hour(s))  CBC     Status: Abnormal   Collection Time: 04/09/18 12:35 PM  Result Value Ref Range   WBC 7.0 4.0 - 10.5 K/uL   RBC 4.75 3.87 - 5.11 MIL/uL   Hemoglobin 12.6 12.0 - 15.0 g/dL   HCT 39.8 36.0 - 46.0 %   MCV 83.8 80.0 -  100.0 fL   MCH 26.5 26.0 - 34.0 pg   MCHC 31.7 30.0 - 36.0 g/dL   RDW 17.9 (H) 11.5 - 15.5 %   Platelets 293 150 - 400 K/uL   nRBC 0.0 0.0 - 0.2 %    Comment: Performed at [facility], [address] [place], [place] [address]  Lactic acid, plasma     Status: None   Collection Time: 04/09/18 12:36 PM  Result Value Ref Range   Lactic Acid, Venous 1.3 0.5 - 1.9 mmol/L    Comment: Performed at [facility], [address] [place], [place] [address]  Urinalysis, Complete w Microscopic     Status: Abnormal   Collection Time: 04/09/18  1:41 PM  Result Value Ref Range   Color, Urine STRAW (A) YELLOW   APPearance CLEAR (A) CLEAR   Specific Gravity, Urine 1.030 1.005 - 1.030   pH 8.0 5.0 - 8.0   Glucose, UA NEGATIVE NEGATIVE mg/dL   Hgb urine dipstick NEGATIVE NEGATIVE   Bilirubin Urine  NEGATIVE NEGATIVE   Ketones, ur 20 (A) NEGATIVE mg/dL   Protein, ur NEGATIVE NEGATIVE mg/dL   Nitrite NEGATIVE NEGATIVE   Leukocytes,Ua NEGATIVE NEGATIVE   RBC / HPF 0-5 0 - 5 RBC/hpf   WBC, UA 0-5 0 - 5 WBC/hpf   Bacteria, UA NONE SEEN NONE SEEN   Squamous Epithelial / LPF 0-5 0 - 5   Mucus PRESENT     Comment: Performed at [facility], [address] [place], [place] [address]  Comprehensive metabolic panel     Status: Abnormal   Collection Time: 04/09/18  1:41 PM  Result Value Ref Range   Sodium 140 135 - 145 mmol/L   Potassium 3.9 3.5 - 5.1 mmol/L   Chloride 106 98 - 111 mmol/L   CO2 25 22 - 32 mmol/L   Glucose, Bld 112 (H) 70 - 99 mg/dL   BUN 19 8 - 23 mg/dL   Creatinine, Ser 1.10 (H) 0.44 - 1.00 mg/dL   Calcium 8.5 (L) 8.9 - 10.3 mg/dL   Total Protein 7.1 6.5 - 8.1 g/dL   Albumin 3.1 (L) 3.5 - 5.0 g/dL   AST 24 15 - 41 U/L   ALT 9 0 - 44 U/L   Alkaline Phosphatase 42 38 - 126 U/L   Total Bilirubin 0.9 0.3 - 1.2 mg/dL   GFR calc non Af Amer 45 (L) >60 mL/min   GFR calc Af Amer 53 (L) >60 mL/min   Anion gap 9 5 - 15    Comment: Performed at [facility], [address] [place], [place] [address]  Lipase, blood     Status: None   Collection Time: 04/09/18  1:41 PM  Result Value Ref Range   Lipase 31 11 - 51 U/L    Comment: Performed at [facility], [address] [place], [place] [address]   Ct Abdomen Pelvis W Contrast  Result Date: 04/09/2018 CLINICAL DATA:  Abdominal pain. History of diverticulitis. Borderline diabetes. Appendectomy. Hysterectomy. Recent surgery for left breast cancer and lymph node removal. EXAM: CT ABDOMEN AND PELVIS WITH CONTRAST TECHNIQUE: Multidetector CT imaging of the abdomen and pelvis was performed using the standard protocol following bolus administration of intravenous contrast. CONTRAST:  160mL OMNIPAQUE IOHEXOL 300 MG/ML  SOLN COMPARISON:  02/16/2018 FINDINGS: Lower chest: Left base scarring. Normal heart  size without pericardial or pleural effusion. Tiny hiatal hernia. Hepatobiliary: Normal liver. Cholecystectomy. Upper normal intrahepatic ducts, similar. No  common duct dilatation. Pancreas: Normal, without mass or ductal dilatation. Spleen: Splenectomy. Adrenals/Urinary Tract: Normal adrenal glands. Mild renal cortical scarring bilaterally. The interpolar left renal 9 mm lesion on the prior exam is again identified on image 12/7. No hydronephrosis. Normal urinary bladder. Stomach/Bowel: Normal stomach, without wall thickening. Left hemicolectomy. Colonic stool burden suggests constipation. The terminal ileum is decompressed. Multifocal areas of mid small bowel wall thickening, mesenteric edema, and fluid. Example in the left side of the abdomen on image 43/2 and a separate segment in the upper right pelvis on image 50/2. The severity of enteritis is felt to be decreased compared to 02/16/2018. No pneumatosis or free intraperitoneal air. no proximal bowel dilatation to suggest high-grade obstruction. Vascular/Lymphatic: Advanced aortic and branch vessel atherosclerosis. The proximal celiac and SMA are patent, with atherosclerosis within. Patent portal and superior mesenteric veins. No abdominopelvic adenopathy. Reproductive: Hysterectomy.  No adnexal mass. Other: Trace pelvic fluid on image 54/2. Musculoskeletal: Osteopenia. Degenerative sclerosis of the bilateral sacroiliac joints. Lumbosacral spondylosis. Convex right thoracolumbar spine curvature. IMPRESSION: 1. Moderate multifocal enteritis. Favor infection. Ischemia could look similar. Advanced aortic and branch vessel atherosclerosis, without proximal mesenteric thrombus. 2. Mesenteric and small volume pelvic fluid, presumably secondary. 3.  Aortic Atherosclerosis (ICD10-I70.0). 4. Left renal lesion as detailed previously. Consider outpatient evaluation with pre and post contrast abdominal MRI at 1 year. Electronically Signed   By: Abigail Miyamoto M.D.   On:  04/09/2018 15:08    Pending Labs Unresulted Labs (From admission, onward)    Start     Ordered   04/09/18 1526  Gastrointestinal Panel by PCR , Stool  (Gastrointestinal Panel by PCR, Stool)  Once,   STAT     04/09/18 1525   04/09/18 1526  C difficile quick scan w PCR reflex  (C Difficile quick screen w PCR reflex panel)  Once, for 24 hours,   STAT     04/09/18 1525   Signed and Held  CBC  (enoxaparin (LOVENOX)    CrCl >/= 30 ml/min)  Once,   R    Comments:  Baseline for enoxaparin therapy IF NOT ALREADY DRAWN.  Notify MD if PLT < 100 K.    Signed and Held   Signed and Held  Creatinine, serum  (enoxaparin (LOVENOX)    CrCl >/= 30 ml/min)  Once,   R    Comments:  Baseline for enoxaparin therapy IF NOT ALREADY DRAWN.    Signed and Held   Signed and Held  Creatinine, serum  (enoxaparin (LOVENOX)    CrCl >/= 30 ml/min)  Weekly,   R    Comments:  while on enoxaparin therapy    Signed and Held   Signed and Held  Basic metabolic panel  Tomorrow morning,   R     Signed and Held   Signed and Held  CBC  Tomorrow morning,   R     Signed and Held          Vitals/Pain Today's Vitals   04/09/18 1500 04/09/18 1521 04/09/18 1530 04/09/18 1600  BP: (!) 155/65  (!) 169/90 (!) 151/76  Pulse: (!) 45   85  Resp: 18  11 11   Temp:      TempSrc:      SpO2: 100%   94%  Weight:      Height:      PainSc:  7       Isolation Precautions Enteric precautions (UV disinfection)  Medications Medications  sodium chloride flush (NS) 0.9 %  injection 3 mL ( Intravenous Canceled Entry 04/09/18 1243)  naloxone [facility]) injection 0.4 mg (0 mg Intravenous Hold 04/09/18 1405)  morphine 2 MG/ML injection 2 mg (has no administration in time range)  piperacillin-tazobactam (ZOSYN) IVPB 3.375 g (has no administration in time range)  ondansetron (ZOFRAN) injection 4 mg (4 mg Intravenous Given 04/09/18 1318)  fentaNYL (SUBLIMAZE) injection 50 mcg (50 mcg Intravenous Given 04/09/18 1318)  sodium chloride 0.9 % bolus  500 mL (500 mLs Intravenous New Bag/Given 04/09/18 1319)  iohexol (OMNIPAQUE) 300 MG/ML solution 100 mL (100 mLs Intravenous Contrast Given 04/09/18 1421)  acetaminophen (TYLENOL) tablet 1,000 mg (1,000 mg Oral Given 04/09/18 1520)    Mobility walks Low fall risk   Focused Assessments   R Recommendations: See Admitting Provider Note  Report given to: Cory Roughen, RN

## 2018-04-09 NOTE — ED Triage Notes (Signed)
Pt arrived via [facility] with abd pain, LLQ. Pt has hx of diverticulitis, htn, and boarderline DM. No N,V, or diarrhea. Breast CA pt, chemo starts tomorrow.

## 2018-04-09 NOTE — H&P (Addendum)
[facility] [place] NAME: Ellen [name]    MR#:  [id number]  DATE OF BIRTH:  [birth year]  DATE OF ADMISSION:  04/09/2018  PRIMARY CARE PHYSICIAN: [name], DO   REQUESTING/REFERRING PHYSICIAN:   CHIEF COMPLAINT:   Chief Complaint  Patient presents with  . Abdominal Pain    HISTORY OF PRESENT ILLNESS: Ellen [name]  is a 85 y.o. female with a known history of breast cancer currently on radiation therapy, chronic kidney disease, hypertension, pulmonary hypertension, multinodular goiter presented to the emergency room for abdominal discomfort.  Abdominal pain started last night.  Pain is located around the umbilicus aching in nature 5 out of 10 on a scale of 1-10.  Patient has some nausea but no diarrhea.  No complaints of any rectal bleed.  No fever and chills.  She was evaluated in the emergency room with CT abdomen which showed enteritis.  Hospitalist service was consulted.  No history of recent travel, sick contacts at home.  WBC count is normal.  PAST MEDICAL HISTORY:   Past Medical History:  Diagnosis Date  . Arthritis   . Cancer ([place]) 01/2018   left breast   . Cataract   . Chronic headaches   . CKD (chronic kidney disease)   . Diverticulitis   . History of syncope   . Hypertension   . Multinodular goiter   . Primary osteoarthritis of left knee   . Pulmonary hypertension ([place])     PAST SURGICAL HISTORY:  Past Surgical History:  Procedure Laterality Date  . ABDOMINAL HYSTERECTOMY    . APPENDECTOMY    . BREAST BIOPSY Left 01/03/2018   left breast stereo/x clip/path pending  . BREAST LUMPECTOMY Left 01/12/2018  . BREAST LUMPECTOMY WITH NEEDLE LOCALIZATION Left 01/12/2018   Procedure: BREAST LUMPECTOMY WITH NEEDLE LOCALIZATION;  Surgeon: Jules Husbands, MD;  Location: [facility] ORS;  Service: General;  Laterality: Left;  . CHOLECYSTECTOMY    . COLON SURGERY    . COLOSTOMY  2008  . COLOSTOMY REVERSAL  2008   . EYE SURGERY    . REPLACEMENT TOTAL KNEE Right 2018  . SENTINEL NODE BIOPSY Left 03/07/2018   Procedure: SENTINEL NODE BIOPSY;  Surgeon: Jules Husbands, MD;  Location: [facility] ORS;  Service: General;  Laterality: Left;    SOCIAL HISTORY:  Social History   Tobacco Use  . Smoking status: Never Smoker  . Smokeless tobacco: Never Used  Substance Use Topics  . Alcohol use: Never    Frequency: Never    FAMILY HISTORY:  Family History  Problem Relation Age of Onset  . Lung cancer Mother   . Diabetes Father   . Stroke Father   . Hypertension Maternal Grandmother   . Hypertension Maternal Uncle   . Hypertension Maternal Aunt   . Ovarian cancer Maternal Aunt   . Heart disease Daughter     DRUG ALLERGIES:  Allergies  Allergen Reactions  . Codeine Nausea Only  . Morphine And Related Nausea Only    REVIEW OF SYSTEMS:   CONSTITUTIONAL: No fever, fatigue or weakness.  EYES: No blurred or double vision.  EARS, NOSE, AND THROAT: No tinnitus or ear pain.  RESPIRATORY: No cough, shortness of breath, wheezing or hemoptysis.  CARDIOVASCULAR: No chest pain, orthopnea, edema.  GASTROINTESTINAL: Has nausea, no vomiting, diarrhea  has abdominal pain.  GENITOURINARY: No dysuria, hematuria.  ENDOCRINE: No polyuria, nocturia,  HEMATOLOGY: No anemia, easy bruising or bleeding SKIN: No rash  or lesion. MUSCULOSKELETAL: No joint pain or arthritis.   NEUROLOGIC: No tingling, numbness, weakness.  PSYCHIATRY: No anxiety or depression.   MEDICATIONS AT HOME:  Prior to Admission medications   Medication Sig Start Date End Date Taking? Authorizing Provider  amLODipine (NORVASC) 5 MG tablet Take 5 mg by mouth daily.  07/07/16  Yes [provider]  aspirin 81 MG chewable tablet Chew 81 mg by mouth daily.    Yes [provider]  gabapentin (NEURONTIN) 100 MG capsule Start 1 capsule daily, increase by 1 cap every 1 week as tolerated, next add 1 in evening, up to max 3 pills in  evening Patient taking differently: Take 100 mg by mouth daily.  11/10/17  Yes Karamalegos, Devonne Doughty, DO  lisinopril-hydrochlorothiazide (PRINZIDE,ZESTORETIC) 20-12.5 MG tablet Take 1 tablet by mouth daily. 12/09/17  Yes Mikey College, NP  acetaminophen (TYLENOL) 500 MG tablet Take 1,000 mg by mouth every 6 (six) hours as needed for moderate pain or headache.     [provider]  cloNIDine (CATAPRES) 0.1 MG tablet TAKE 1 TABLET BY MOUTH DAILY AS NEEDED FOR ELEVATED BLOOD PRESSURE GREATER THAN 180/100 11/10/17   Karamalegos, Devonne Doughty, DO  HYDROcodone-acetaminophen (NORCO/VICODIN) 5-325 MG tablet Take 1 tablet by mouth every 6 (six) hours as needed for moderate pain. Patient not taking: Reported on 04/09/2018 01/12/18   Caroleen Hamman F, MD  HYDROcodone-acetaminophen (NORCO/VICODIN) 5-325 MG tablet Take 1-2 tablets by mouth every 4 (four) hours as needed for moderate pain. Patient not taking: Reported on 04/09/2018 03/07/18   Caroleen Hamman F, MD  traMADol (ULTRAM) 50 MG tablet Take 1 tablet (50 mg total) by mouth every 6 (six) hours as needed. 02/16/18   Harvest Dark, MD      PHYSICAL EXAMINATION:   VITAL SIGNS: Blood pressure (!) 151/76, pulse 85, temperature (!) 97.5 F (36.4 C), temperature source Oral, resp. rate 11, height 5' (1.524 m), weight 71.6 kg, SpO2 94 %.  GENERAL:  85 y.o.-year-old patient lying in the bed with no acute distress.  EYES: Pupils equal, round, reactive to light and accommodation. No scleral icterus. Extraocular muscles intact.  HEENT: Head atraumatic, normocephalic. Oropharynx and nasopharynx clear.  NECK:  Supple, no jugular venous distention. No thyroid enlargement, no tenderness.  LUNGS: Normal breath sounds bilaterally, no wheezing, rales,rhonchi or crepitation. No use of accessory muscles of respiration.  CARDIOVASCULAR: S1, S2 normal. No murmurs, rubs, or gallops.  ABDOMEN: Soft, tenderness around umbilicus, nondistended. Bowel sounds present.  No organomegaly or mass.  EXTREMITIES: No pedal edema, cyanosis, or clubbing.  NEUROLOGIC: Cranial nerves II through XII are intact. Muscle strength 5/5 in all extremities. Sensation intact. Gait not checked.  PSYCHIATRIC: The patient is alert and oriented x 3.  SKIN: No obvious rash, lesion, or ulcer.   LABORATORY PANEL:   CBC Recent Labs  Lab 04/09/18 1235  WBC 7.0  HGB 12.6  HCT 39.8  PLT 293  MCV 83.8  MCH 26.5  MCHC 31.7  RDW 17.9*   ------------------------------------------------------------------------------------------------------------------  Chemistries  Recent Labs  Lab 04/09/18 1341  NA 140  K 3.9  CL 106  CO2 25  GLUCOSE 112*  BUN 19  CREATININE 1.10*  CALCIUM 8.5*  AST 24  ALT 9  ALKPHOS 42  BILITOT 0.9   ------------------------------------------------------------------------------------------------------------------ estimated creatinine clearance is 32.4 mL/min (A) (by C-G formula based on SCr of 1.1 mg/dL (H)). ------------------------------------------------------------------------------------------------------------------ No results for input(s): TSH, T4TOTAL, T3FREE, THYROIDAB in the last 72 hours.  Invalid input(s): FREET3  Coagulation profile No results for input(s): INR, PROTIME in the last 168 hours. ------------------------------------------------------------------------------------------------------------------- No results for input(s): DDIMER in the last 72 hours. -------------------------------------------------------------------------------------------------------------------  Cardiac Enzymes No results for input(s): CKMB, TROPONINI, MYOGLOBIN in the last 168 hours.  Invalid input(s): CK ------------------------------------------------------------------------------------------------------------------ Invalid input(s):  POCBNP  ---------------------------------------------------------------------------------------------------------------  Urinalysis    Component Value Date/Time   COLORURINE STRAW (A) 04/09/2018 1341   APPEARANCEUR CLEAR (A) 04/09/2018 1341   LABSPEC 1.030 04/09/2018 1341   PHURINE 8.0 04/09/2018 1341   GLUCOSEU NEGATIVE 04/09/2018 1341   HGBUR NEGATIVE 04/09/2018 1341   BILIRUBINUR NEGATIVE 04/09/2018 1341   KETONESUR 20 (A) 04/09/2018 1341   PROTEINUR NEGATIVE 04/09/2018 1341   NITRITE NEGATIVE 04/09/2018 1341   LEUKOCYTESUR NEGATIVE 04/09/2018 1341     RADIOLOGY: Ct Abdomen Pelvis W Contrast  Result Date: 04/09/2018 CLINICAL DATA:  Abdominal pain. History of diverticulitis. Borderline diabetes. Appendectomy. Hysterectomy. Recent surgery for left breast cancer and lymph node removal. EXAM: CT ABDOMEN AND PELVIS WITH CONTRAST TECHNIQUE: Multidetector CT imaging of the abdomen and pelvis was performed using the standard protocol following bolus administration of intravenous contrast. CONTRAST:  156mL OMNIPAQUE IOHEXOL 300 MG/ML  SOLN COMPARISON:  02/16/2018 FINDINGS: Lower chest: Left base scarring. Normal heart size without pericardial or pleural effusion. Tiny hiatal hernia. Hepatobiliary: Normal liver. Cholecystectomy. Upper normal intrahepatic ducts, similar. No common duct dilatation. Pancreas: Normal, without mass or ductal dilatation. Spleen: Splenectomy. Adrenals/Urinary Tract: Normal adrenal glands. Mild renal cortical scarring bilaterally. The interpolar left renal 9 mm lesion on the prior exam is again identified on image 12/7. No hydronephrosis. Normal urinary bladder. Stomach/Bowel: Normal stomach, without wall thickening. Left hemicolectomy. Colonic stool burden suggests constipation. The terminal ileum is decompressed. Multifocal areas of mid small bowel wall thickening, mesenteric edema, and fluid. Example in the left side of the abdomen on image 43/2 and a separate segment in  the upper right pelvis on image 50/2. The severity of enteritis is felt to be decreased compared to 02/16/2018. No pneumatosis or free intraperitoneal air. no proximal bowel dilatation to suggest high-grade obstruction. Vascular/Lymphatic: Advanced aortic and branch vessel atherosclerosis. The proximal celiac and SMA are patent, with atherosclerosis within. Patent portal and superior mesenteric veins. No abdominopelvic adenopathy. Reproductive: Hysterectomy.  No adnexal mass. Other: Trace pelvic fluid on image 54/2. Musculoskeletal: Osteopenia. Degenerative sclerosis of the bilateral sacroiliac joints. Lumbosacral spondylosis. Convex right thoracolumbar spine curvature. IMPRESSION: 1. Moderate multifocal enteritis. Favor infection. Ischemia could look similar. Advanced aortic and branch vessel atherosclerosis, without proximal mesenteric thrombus. 2. Mesenteric and small volume pelvic fluid, presumably secondary. 3.  Aortic Atherosclerosis (ICD10-I70.0). 4. Left renal lesion as detailed previously. Consider outpatient evaluation with pre and post contrast abdominal MRI at 1 year. Electronically Signed   By: Abigail Miyamoto M.D.   On: 04/09/2018 15:08    EKG: Orders placed or performed during the hospital encounter of 04/09/18  . EKG 12-Lead  . EKG 12-Lead    IMPRESSION AND PLAN: 85 year old female patient with a known history of breast cancer currently on radiation therapy, chronic kidney disease, hypertension, pulmonary hypertension, multinodular goiter presented to the emergency room for abdominal discomfort.   -Acute enteritis Probably viral Received 1 dose of antibiotic Zosyn in the emergency room IV We will follow-up lactic acid level Surgery consult for evaluation of any ischemic colitis component Monitor WBC count We will admit patient under observation bed If pain controlled, discharge home in am  -Dehydration IV fluids  -Breast cancer Currently on radiation therapy as out pt  -  DVT  prophylaxis with subcu Lovenox daily  All the records are reviewed and case discussed with ED provider. Management plans discussed with the patient, family and they are in agreement.  CODE STATUS:Full code Code Status History    Date Active Date Inactive Code Status Order ID Comments User Context   09/18/2017 0700 09/19/2017 1623 Full Code [id number]  Arta Silence, MD Inpatient       TOTAL TIME TAKING CARE OF THIS PATIENT: 54 minutes.    Saundra Shelling M.D on 04/09/2018 at 4:34 PM  Between 7am to 6pm - Pager - [phone]  After 6pm go to [email or web address] - password [email or web address] [facility]  Office  [phone]  CC: Primary care physician; [name], DO

## 2018-04-09 NOTE — ED Notes (Signed)
Narcan at bedside.

## 2018-04-09 NOTE — Progress Notes (Signed)
MD messaged: Do you know if this patient will have chemotherapy tomorrow she indicates she is support to start chemo outpatient tomorrow.

## 2018-04-09 NOTE — Progress Notes (Signed)
Stool sample has not been able to be collected to rule out C. Diff

## 2018-04-09 NOTE — Progress Notes (Addendum)
Advanced care plan. Purpose of the Encounter: CODE STATUS Parties in Attendance: Patient Patient's Decision Capacity: Good Subjective/Patient's story: Ellen [name]  is a 85 y.o. female with a known history of breast cancer currently on radiation therapy, chronic kidney disease, hypertension, pulmonary hypertension, multinodular goiter presented to the emergency room for abdominal discomfort.  Abdominal pain started last night.  Pain is located around the umbilicus aching in nature 5 out of 10 on a scale of 1-10.  Patient currently on radiation therapy for breast cancer. Objective/Medical story Patient was evaluated in the emergency room worked up with CT abdomen which showed multifocal enteritis.  Needs evaluation and follow-up.  Needs pain management and antibiotics if possibly bacterial in etiology.  Need surgical consultation to evaluate for any ischemic colitis. Goals of care determination:  Advance care directives and goals of care treatment plans discussed.  Patient currently wants everything done which includes CPR, intubation ventilator if the need arises CODE STATUS: Full code Time spent discussing advanced care planning: 16 minutes

## 2018-04-09 NOTE — ED Notes (Signed)
Surgeon at bedside.  

## 2018-04-10 ENCOUNTER — Ambulatory Visit
Admission: RE | Admit: 2018-04-10 | Discharge: 2018-04-10 | Disposition: A | Discharge status: Home / Self Care | Payer: Medicare Other | Source: Ambulatory Visit | Attending: Radiation Oncology | Admitting: Radiation Oncology

## 2018-04-10 ENCOUNTER — Ambulatory Visit: Payer: Medicare Other

## 2018-04-10 DIAGNOSIS — Z510 Encounter for antineoplastic radiation therapy: Secondary | ICD-10-CM | POA: Diagnosis not present

## 2018-04-10 DIAGNOSIS — K529 Noninfective gastroenteritis and colitis, unspecified: Secondary | ICD-10-CM | POA: Diagnosis not present

## 2018-04-10 DIAGNOSIS — E860 Dehydration: Secondary | ICD-10-CM | POA: Diagnosis not present

## 2018-04-10 DIAGNOSIS — C50919 Malignant neoplasm of unspecified site of unspecified female breast: Secondary | ICD-10-CM | POA: Diagnosis not present

## 2018-04-10 DIAGNOSIS — I10 Essential (primary) hypertension: Secondary | ICD-10-CM | POA: Diagnosis not present

## 2018-04-10 DIAGNOSIS — C50412 Malignant neoplasm of upper-outer quadrant of left female breast: Secondary | ICD-10-CM | POA: Diagnosis not present

## 2018-04-10 DIAGNOSIS — Z170 Estrogen receptor positive status [ER+]: Secondary | ICD-10-CM | POA: Diagnosis not present

## 2018-04-10 DIAGNOSIS — D0512 Intraductal carcinoma in situ of left breast: Secondary | ICD-10-CM | POA: Diagnosis not present

## 2018-04-10 LAB — BASIC METABOLIC PANEL
Anion gap: 8 (ref 5–15)
BUN: 18 mg/dL (ref 8–23)
CHLORIDE: 105 mmol/L (ref 98–111)
CO2: 26 mmol/L (ref 22–32)
Calcium: 8.5 mg/dL — ABNORMAL LOW (ref 8.9–10.3)
Creatinine, Ser: 1.08 mg/dL — ABNORMAL HIGH (ref 0.44–1.00)
GFR calc Af Amer: 54 mL/min — ABNORMAL LOW (ref >60)
GFR calc non Af Amer: 46 mL/min — ABNORMAL LOW (ref >60)
Glucose, Bld: 84 mg/dL (ref 70–99)
Potassium: 3.5 mmol/L (ref 3.5–5.1)
Sodium: 139 mmol/L (ref 135–145)

## 2018-04-10 LAB — CBC
HCT: 33.1 % — ABNORMAL LOW (ref 36.0–46.0)
Hemoglobin: 10.4 g/dL — ABNORMAL LOW (ref 12.0–15.0)
MCH: 26.3 pg (ref 26.0–34.0)
MCHC: 31.4 g/dL (ref 30.0–36.0)
MCV: 83.8 fL (ref 80.0–100.0)
Platelets: 273 K/uL (ref 150–400)
RBC: 3.95 MIL/uL (ref 3.87–5.11)
RDW: 18.3 % — ABNORMAL HIGH (ref 11.5–15.5)
WBC: 6.9 K/uL (ref 4.0–10.5)
nRBC: 0 % (ref 0.0–0.2)

## 2018-04-10 MED ORDER — AMOXICILLIN-POT CLAVULANATE 875-125 MG PO TABS: 1.0000 | ORAL_TABLET | Freq: Two times a day (BID) | ORAL | 0 refills | Status: AC

## 2018-04-10 NOTE — Progress Notes (Addendum)
Ellen [name]  A and O x 4. VSS. Pt tolerating diet well. No complaints of pain or nausea. IV removed intact, prescriptions given. Pt voiced understanding of discharge instructions with no further questions. Pt discharged via wheelchair.   Allergies as of 04/10/2018      Reactions   Codeine Nausea Only   Morphine And Related Nausea Only      Medication List    TAKE these medications   acetaminophen 500 MG tablet Commonly known as:  TYLENOL Take 1,000 mg by mouth every 6 (six) hours as needed for moderate pain or headache.   amLODipine 5 MG tablet Commonly known as:  NORVASC Take 5 mg by mouth daily.   amoxicillin-clavulanate 875-125 MG tablet Commonly known as:  Augmentin Take 1 tablet by mouth 2 (two) times daily for 14 days.   aspirin 81 MG chewable tablet Chew 81 mg by mouth daily.   cloNIDine 0.1 MG tablet Commonly known as:  CATAPRES TAKE 1 TABLET BY MOUTH DAILY AS NEEDED FOR ELEVATED BLOOD PRESSURE GREATER THAN 180/100   gabapentin 100 MG capsule Commonly known as:  NEURONTIN Start 1 capsule daily, increase by 1 cap every 1 week as tolerated, next add 1 in evening, up to max 3 pills in evening What changed:    how much to take  how to take this  when to take this  additional instructions   HYDROcodone-acetaminophen 5-325 MG tablet Commonly known as:  NORCO/VICODIN Take 1 tablet by mouth every 6 (six) hours as needed for moderate pain.   HYDROcodone-acetaminophen 5-325 MG tablet Commonly known as:  NORCO/VICODIN Take 1-2 tablets by mouth every 4 (four) hours as needed for moderate pain.   lisinopril-hydrochlorothiazide 20-12.5 MG tablet Commonly known as:  PRINZIDE,ZESTORETIC Take 1 tablet by mouth daily.   traMADol 50 MG tablet Commonly known as:  Ultram Take 1 tablet (50 mg total) by mouth every 6 (six) hours as needed.       Vitals:   04/09/18 2042 04/10/18 0524  BP: (!) 96/57 (!) 113/55  Pulse: 65 65  Resp: 20 20  Temp: 98.8 F (37.1 C) 98.6  F (37 C)  SpO2: 95% 97%    Francesco Sor

## 2018-04-10 NOTE — Discharge Instructions (Signed)
Soft bland diet for 1-2 days and advance you diet after

## 2018-04-10 NOTE — Care Management Obs Status (Signed)
[facility] NOTIFICATION   Patient Details  Name: Alisa Stjames MRN: [id number] Date of Birth: 08-03-1929   Medicare Observation Status Notification Given:  No(admitted less than 24 hours)    Athea Haley, Illene Silver, RN 04/10/2018, 10:00 AM

## 2018-04-11 ENCOUNTER — Ambulatory Visit
Admission: RE | Admit: 2018-04-11 | Discharge: 2018-04-11 | Disposition: A | Discharge status: Home / Self Care | Payer: Medicare Other | Source: Ambulatory Visit | Attending: Radiation Oncology | Admitting: Radiation Oncology

## 2018-04-11 DIAGNOSIS — C50412 Malignant neoplasm of upper-outer quadrant of left female breast: Secondary | ICD-10-CM | POA: Diagnosis not present

## 2018-04-11 DIAGNOSIS — Z170 Estrogen receptor positive status [ER+]: Secondary | ICD-10-CM | POA: Diagnosis not present

## 2018-04-11 DIAGNOSIS — D0512 Intraductal carcinoma in situ of left breast: Secondary | ICD-10-CM | POA: Diagnosis not present

## 2018-04-11 DIAGNOSIS — Z510 Encounter for antineoplastic radiation therapy: Secondary | ICD-10-CM | POA: Diagnosis not present

## 2018-04-12 ENCOUNTER — Ambulatory Visit (INDEPENDENT_AMBULATORY_CARE_PROVIDER_SITE_OTHER): Payer: Medicare Other | Admitting: Family Medicine

## 2018-04-12 ENCOUNTER — Ambulatory Visit
Admission: RE | Admit: 2018-04-12 | Discharge: 2018-04-12 | Disposition: A | Discharge status: Home / Self Care | Payer: Medicare Other | Source: Ambulatory Visit | Attending: Radiation Oncology | Admitting: Radiation Oncology

## 2018-04-12 ENCOUNTER — Telehealth: Payer: Self-pay | Admitting: Family Medicine

## 2018-04-12 ENCOUNTER — Encounter: Payer: Self-pay | Admitting: Family Medicine

## 2018-04-12 ENCOUNTER — Other Ambulatory Visit: Payer: Self-pay

## 2018-04-12 VITALS — BP 117/55 | HR 65 | Temp 98.7°F | Resp 16 | Ht 60.0 in | Wt 156.2 lb

## 2018-04-12 DIAGNOSIS — R51 Headache: Secondary | ICD-10-CM

## 2018-04-12 DIAGNOSIS — K529 Noninfective gastroenteritis and colitis, unspecified: Secondary | ICD-10-CM

## 2018-04-12 DIAGNOSIS — E1169 Type 2 diabetes mellitus with other specified complication: Secondary | ICD-10-CM

## 2018-04-12 DIAGNOSIS — C50412 Malignant neoplasm of upper-outer quadrant of left female breast: Secondary | ICD-10-CM | POA: Diagnosis not present

## 2018-04-12 DIAGNOSIS — D0512 Intraductal carcinoma in situ of left breast: Secondary | ICD-10-CM | POA: Diagnosis not present

## 2018-04-12 DIAGNOSIS — E785 Hyperlipidemia, unspecified: Principal | ICD-10-CM

## 2018-04-12 DIAGNOSIS — G8929 Other chronic pain: Secondary | ICD-10-CM

## 2018-04-12 DIAGNOSIS — Z170 Estrogen receptor positive status [ER+]: Secondary | ICD-10-CM | POA: Diagnosis not present

## 2018-04-12 DIAGNOSIS — Z510 Encounter for antineoplastic radiation therapy: Secondary | ICD-10-CM | POA: Diagnosis not present

## 2018-04-12 MED ORDER — PRAVASTATIN SODIUM 20 MG PO TABS: 20.0000 mg | ORAL_TABLET | Freq: Every day | ORAL | 3 refills | Status: DC

## 2018-04-12 NOTE — Progress Notes (Signed)
Subjective:    Patient ID: Ellen [name], female    DOB: [birth year], 85 y.o.   MRN: [id number]  Ellen [name] is a 85 y.o. female presenting on 04/12/2018 for Hospitalization Follow-up (abdominal pain)   HPI   HOSPITAL FOLLOW-UP VISIT  Hospital/Location: [facility] Date of Admission: 04/09/18 Date of Discharge: 04/10/18 Transitions of care telephone call: Not completed  Reason for Admission: Abdominal Pain Primary (+Secondary) Diagnosis: Enteritis  FOLLOW-UP  - Hospital H&P and other notes have been reviewed - as of now the Discharge Summary does not appear to be finalized or available. - Patient presents today about 2 days after recent hospitalization. Brief summary of recent course, patient had symptoms of abdominal pain onset for 1-2 days prior, went to hospital and had CT imaging that showed enteritis, hospitalized, treated with antibiotic course.  - Today reports overall has done well after discharge. Symptoms of abdominal pain have improved but not quite resolved. No new symptoms or concern.  She has had prior colonoscopy by report >6 years ago. They asked about this in hospital.  She is not on pain medication, was prescribed in past by surgery, but she does not take this. Only taking Tylenol PRN  - New medications on discharge: Augmentin BID x 14 days, started 04/10/18 - Changes to current meds on discharge: none  Denies nausea, vomiting, diarrhea, fever chills sweats, rectal bleeding  Additional concern: Chronic headache - have evaluated her for this before, she was seeing [facility] back in 08/2017, see prior notes, has had Head CT in 08/2017, but then was told needed MRI. She was never able to follow back up with Neuro, despite referral in 11/2017, and re-schedule in 12/2017. She missed apt due to recent course with breast cancer - She is taking Gabapentin 100mg  daily in AM, and may repeat later in day 1-2 more times PRN for headache  I have reviewed the discharge medication list,  and have reconciled the current and discharge medications today.   Depression screen [facility] 2/9 04/12/2018 03/13/2018 01/11/2018  Decreased Interest 0 0 0  Down, Depressed, Hopeless 0 0 0  PHQ - 2 Score 0 0 0    Social History   Tobacco Use  . Smoking status: Never Smoker  . Smokeless tobacco: Never Used  Substance Use Topics  . Alcohol use: Never    Frequency: Never  . Drug use: Never    Review of Systems Per HPI unless specifically indicated above     Objective:    BP (!) 117/55   Pulse 65   Temp 98.7 F (37.1 C) (Oral)   Resp 16   Ht 5' (1.524 m)   Wt 156 lb 3.2 oz (70.9 kg)   SpO2 100%   BMI 30.51 kg/m   Wt Readings from Last 3 Encounters:  04/12/18 156 lb 3.2 oz (70.9 kg)  04/09/18 157 lb 12.8 oz (71.6 kg)  03/29/18 157 lb 8.3 oz (71.5 kg)    Physical Exam Vitals signs and nursing note reviewed.  Constitutional:      General: She is not in acute distress.    Appearance: She is well-developed. She is not diaphoretic.     Comments: Well-appearing, comfortable, cooperative  HENT:     Head: Normocephalic and atraumatic.     Comments: Left posterior aspect of scalp, appears mostly normal, do not appreciate significant mass or swelling, this is area of her headache Eyes:     General:        Right eye: No  discharge.        Left eye: No discharge.     Conjunctiva/sclera: Conjunctivae normal.  Neck:     Musculoskeletal: Normal range of motion and neck supple.     Thyroid: No thyromegaly.  Cardiovascular:     Rate and Rhythm: Normal rate and regular rhythm.     Heart sounds: Normal heart sounds. No murmur.  Pulmonary:     Effort: Pulmonary effort is normal. No respiratory distress.     Breath sounds: Normal breath sounds. No wheezing or rales.  Abdominal:     General: Bowel sounds are normal. There is no distension.     Palpations: Abdomen is soft. There is no mass.     Tenderness: There is abdominal tenderness (left lower quadrant abdomen mild tender). There  is no guarding or rebound.  Lymphadenopathy:     Cervical: No cervical adenopathy.  Skin:    General: Skin is warm and dry.     Findings: No erythema or rash.  Neurological:     Mental Status: She is alert and oriented to person, place, and time.  Psychiatric:        Behavior: Behavior normal.     Comments: Well groomed, good eye contact, normal speech and thoughts    I have personally reviewed the radiology report from 04/09/18 on Abdominal CT.  CLINICAL DATA:  Abdominal pain. History of diverticulitis. Borderline diabetes. Appendectomy. Hysterectomy. Recent surgery for left breast cancer and lymph node removal.  EXAM: CT ABDOMEN AND PELVIS WITH CONTRAST  TECHNIQUE: Multidetector CT imaging of the abdomen and pelvis was performed using the standard protocol following bolus administration of intravenous contrast.  CONTRAST:  122mL OMNIPAQUE IOHEXOL 300 MG/ML  SOLN  COMPARISON:  02/16/2018  FINDINGS: Lower chest: Left base scarring. Normal heart size without pericardial or pleural effusion. Tiny hiatal hernia.  Hepatobiliary: Normal liver. Cholecystectomy. Upper normal intrahepatic ducts, similar. No common duct dilatation.  Pancreas: Normal, without mass or ductal dilatation.  Spleen: Splenectomy.  Adrenals/Urinary Tract: Normal adrenal glands. Mild renal cortical scarring bilaterally. The interpolar left renal 9 mm lesion on the prior exam is again identified on image 12/7. No hydronephrosis. Normal urinary bladder.  Stomach/Bowel: Normal stomach, without wall thickening. Left hemicolectomy. Colonic stool burden suggests constipation. The terminal ileum is decompressed.  Multifocal areas of mid small bowel wall thickening, mesenteric edema, and fluid. Example in the left side of the abdomen on image 43/2 and a separate segment in the upper right pelvis on image 50/2.  The severity of enteritis is felt to be decreased compared to 02/16/2018. No  pneumatosis or free intraperitoneal air. no proximal bowel dilatation to suggest high-grade obstruction.  Vascular/Lymphatic: Advanced aortic and branch vessel atherosclerosis. The proximal celiac and SMA are patent, with atherosclerosis within. Patent portal and superior mesenteric veins.  No abdominopelvic adenopathy.  Reproductive: Hysterectomy.  No adnexal mass.  Other: Trace pelvic fluid on image 54/2.  Musculoskeletal: Osteopenia. Degenerative sclerosis of the bilateral sacroiliac joints. Lumbosacral spondylosis. Convex right thoracolumbar spine curvature.  IMPRESSION: 1. Moderate multifocal enteritis. Favor infection. Ischemia could look similar. Advanced aortic and branch vessel atherosclerosis, without proximal mesenteric thrombus. 2. Mesenteric and small volume pelvic fluid, presumably secondary. 3.  Aortic Atherosclerosis (ICD10-I70.0). 4. Left renal lesion as detailed previously. Consider outpatient evaluation with pre and post contrast abdominal MRI at 1 year.   Electronically Signed   By: Abigail Miyamoto M.D.   On: 04/09/2018 15:08  Results for orders placed or performed during the hospital encounter of  04/09/18  CBC  Result Value Ref Range   WBC 7.0 4.0 - 10.5 K/uL   RBC 4.75 3.87 - 5.11 MIL/uL   Hemoglobin 12.6 12.0 - 15.0 g/dL   HCT 39.8 36.0 - 46.0 %   MCV 83.8 80.0 - 100.0 fL   MCH 26.5 26.0 - 34.0 pg   MCHC 31.7 30.0 - 36.0 g/dL   RDW 17.9 (H) 11.5 - 15.5 %   Platelets 293 150 - 400 K/uL   nRBC 0.0 0.0 - 0.2 %  Urinalysis, Complete w Microscopic  Result Value Ref Range   Color, Urine STRAW (A) YELLOW   APPearance CLEAR (A) CLEAR   Specific Gravity, Urine 1.030 1.005 - 1.030   pH 8.0 5.0 - 8.0   Glucose, UA NEGATIVE NEGATIVE mg/dL   Hgb urine dipstick NEGATIVE NEGATIVE   Bilirubin Urine NEGATIVE NEGATIVE   Ketones, ur 20 (A) NEGATIVE mg/dL   Protein, ur NEGATIVE NEGATIVE mg/dL   Nitrite NEGATIVE NEGATIVE   Leukocytes,Ua NEGATIVE NEGATIVE    RBC / HPF 0-5 0 - 5 RBC/hpf   WBC, UA 0-5 0 - 5 WBC/hpf   Bacteria, UA NONE SEEN NONE SEEN   Squamous Epithelial / LPF 0-5 0 - 5   Mucus PRESENT   Comprehensive metabolic panel  Result Value Ref Range   Sodium 140 135 - 145 mmol/L   Potassium 3.9 3.5 - 5.1 mmol/L   Chloride 106 98 - 111 mmol/L   CO2 25 22 - 32 mmol/L   Glucose, Bld 112 (H) 70 - 99 mg/dL   BUN 19 8 - 23 mg/dL   Creatinine, Ser 1.10 (H) 0.44 - 1.00 mg/dL   Calcium 8.5 (L) 8.9 - 10.3 mg/dL   Total Protein 7.1 6.5 - 8.1 g/dL   Albumin 3.1 (L) 3.5 - 5.0 g/dL   AST 24 15 - 41 U/L   ALT 9 0 - 44 U/L   Alkaline Phosphatase 42 38 - 126 U/L   Total Bilirubin 0.9 0.3 - 1.2 mg/dL   GFR calc non Af Amer 45 (L) >60 mL/min   GFR calc Af Amer 53 (L) >60 mL/min   Anion gap 9 5 - 15  Lipase, blood  Result Value Ref Range   Lipase 31 11 - 51 U/L  Lactic acid, plasma  Result Value Ref Range   Lactic Acid, Venous 1.3 0.5 - 1.9 mmol/L  Basic metabolic panel  Result Value Ref Range   Sodium 139 135 - 145 mmol/L   Potassium 3.5 3.5 - 5.1 mmol/L   Chloride 105 98 - 111 mmol/L   CO2 26 22 - 32 mmol/L   Glucose, Bld 84 70 - 99 mg/dL   BUN 18 8 - 23 mg/dL   Creatinine, Ser 1.08 (H) 0.44 - 1.00 mg/dL   Calcium 8.5 (L) 8.9 - 10.3 mg/dL   GFR calc non Af Amer 46 (L) >60 mL/min   GFR calc Af Amer 54 (L) >60 mL/min   Anion gap 8 5 - 15  CBC  Result Value Ref Range   WBC 6.9 4.0 - 10.5 K/uL   RBC 3.95 3.87 - 5.11 MIL/uL   Hemoglobin 10.4 (L) 12.0 - 15.0 g/dL   HCT 33.1 (L) 36.0 - 46.0 %   MCV 83.8 80.0 - 100.0 fL   MCH 26.3 26.0 - 34.0 pg   MCHC 31.4 30.0 - 36.0 g/dL   RDW 18.3 (H) 11.5 - 15.5 %   Platelets 273 150 - 400 K/uL  nRBC 0.0 0.0 - 0.2 %      Assessment & Plan:   Problem List Items Addressed This Visit    Chronic headaches Uncertain etiology, no abnormality of scalp or head, seems consistent with chronic headaches she has experienced on previous encounter  Contact referral coordinator again to help with  patient scheduling, needs to be scheduled back with [facility] neurology for follow-up and further testing / treatment   I advised in meantime, increase gabapentin from 100mg  TID may take higher dose up to 2 pills per dose 200mg  if need in future.    Enteritis - Primary    Improving enteritis, as identified on CT iamging 3/8 S/p IV antibiotics, then transitioned to augmentin PO, tolerating well Consulted by gen surg in hospital, no sign of ischemic symptoms as cause  Plan Agree with current plan, finish 2 week course Augmentin antibiotic for now, no changes, as she is improving Bowel regimen PRN Follow-up as need, may consider refer to GI in future if indicated  No orders of the defined types were placed in this encounter.   Follow up plan: Return in about 2 months (around 06/16/2018), or if symptoms worsen or fail to improve, for keep apt as scheduled May 2020.   [name], [facility] 04/12/2018, 11:28 AM

## 2018-04-12 NOTE — Patient Instructions (Addendum)
Thank you for coming to the office today.  Finish Antibiotic Augmentin for up to 2 weeks.  We will reach out again to [facility] to find out what happened with the referral - I do not see that you have ever seen the Neurologist specialist yet for the headache  [facility] - Neurology Dept [place], [place] [address] Phone: [phone] -----------------------------------------------   Please schedule a Follow-up Appointment to: Return in about 2 months (around 06/16/2018), or if symptoms worsen or fail to improve, for keep apt as scheduled May 2020.  If you have any other questions or concerns, please feel free to call the office or send a message through [place]. You may also schedule an earlier appointment if necessary.  Additionally, you may be receiving a survey about your experience at our office within a few days to 1 week by e-mail or mail. We value your feedback.  [name], DO [place]

## 2018-04-12 NOTE — Telephone Encounter (Signed)
Additionally patient requested refill of prior Pravastatin 20mg  daily - she had run out, and asked if she could restart this one.  [name], [facility] 04/12/2018, 11:34 AM

## 2018-04-12 NOTE — Telephone Encounter (Signed)
Patient still has not seen [facility] for chronic headaches.  She was initially referred 11/2017. She had missed an apt and had to re-schedule to January 2020.  Then she had new diagnosis with breast cancer, and I believe may have had to re-schedule again?  She says that this was last evaluated in July 2019 by [facility] and had Head CT, but she needed an MRI and this has not happened yet.  --------------------------------------  Can you contact Lexine Baton and follow-up with [facility] to see what the barrier is to scheduling her? If there is no problem, then I would go ahead and recommend that she is scheduled for apt and we can just notify the patient of the actual appointment, because it seems that she is not scheduling the apt on her own.  [name], DO [facility] 04/12/2018, 1:10 PM

## 2018-04-13 ENCOUNTER — Ambulatory Visit
Admission: RE | Admit: 2018-04-13 | Discharge: 2018-04-13 | Disposition: A | Discharge status: Home / Self Care | Payer: Medicare Other | Source: Ambulatory Visit | Attending: Radiation Oncology | Admitting: Radiation Oncology

## 2018-04-13 ENCOUNTER — Other Ambulatory Visit: Payer: Self-pay

## 2018-04-13 DIAGNOSIS — Z510 Encounter for antineoplastic radiation therapy: Secondary | ICD-10-CM | POA: Diagnosis not present

## 2018-04-13 DIAGNOSIS — D0512 Intraductal carcinoma in situ of left breast: Secondary | ICD-10-CM | POA: Diagnosis not present

## 2018-04-13 DIAGNOSIS — C50412 Malignant neoplasm of upper-outer quadrant of left female breast: Secondary | ICD-10-CM | POA: Diagnosis not present

## 2018-04-13 DIAGNOSIS — Z170 Estrogen receptor positive status [ER+]: Secondary | ICD-10-CM | POA: Diagnosis not present

## 2018-04-13 NOTE — Telephone Encounter (Signed)
Ellen [name] will work on this referral and her appointment and let us know.

## 2018-04-14 ENCOUNTER — Other Ambulatory Visit: Payer: Self-pay

## 2018-04-14 ENCOUNTER — Ambulatory Visit
Admission: RE | Admit: 2018-04-14 | Discharge: 2018-04-14 | Disposition: A | Discharge status: Home / Self Care | Payer: Medicare Other | Source: Ambulatory Visit | Attending: Radiation Oncology | Admitting: Radiation Oncology

## 2018-04-14 DIAGNOSIS — Z510 Encounter for antineoplastic radiation therapy: Secondary | ICD-10-CM | POA: Diagnosis not present

## 2018-04-14 DIAGNOSIS — Z170 Estrogen receptor positive status [ER+]: Secondary | ICD-10-CM | POA: Diagnosis not present

## 2018-04-14 DIAGNOSIS — C50412 Malignant neoplasm of upper-outer quadrant of left female breast: Secondary | ICD-10-CM | POA: Diagnosis not present

## 2018-04-14 DIAGNOSIS — D0512 Intraductal carcinoma in situ of left breast: Secondary | ICD-10-CM | POA: Diagnosis not present

## 2018-04-17 ENCOUNTER — Other Ambulatory Visit: Payer: Self-pay

## 2018-04-17 ENCOUNTER — Ambulatory Visit
Admission: RE | Admit: 2018-04-17 | Discharge: 2018-04-17 | Disposition: A | Discharge status: Home / Self Care | Payer: Medicare Other | Source: Ambulatory Visit | Attending: Radiation Oncology | Admitting: Radiation Oncology

## 2018-04-17 DIAGNOSIS — D0512 Intraductal carcinoma in situ of left breast: Secondary | ICD-10-CM | POA: Diagnosis not present

## 2018-04-17 DIAGNOSIS — Z170 Estrogen receptor positive status [ER+]: Secondary | ICD-10-CM | POA: Diagnosis not present

## 2018-04-17 DIAGNOSIS — Z510 Encounter for antineoplastic radiation therapy: Secondary | ICD-10-CM | POA: Diagnosis not present

## 2018-04-17 DIAGNOSIS — C50412 Malignant neoplasm of upper-outer quadrant of left female breast: Secondary | ICD-10-CM | POA: Diagnosis not present

## 2018-04-18 ENCOUNTER — Ambulatory Visit
Admission: RE | Admit: 2018-04-18 | Discharge: 2018-04-18 | Disposition: A | Discharge status: Home / Self Care | Payer: Medicare Other | Source: Ambulatory Visit | Attending: Radiation Oncology | Admitting: Radiation Oncology

## 2018-04-18 ENCOUNTER — Other Ambulatory Visit: Payer: Self-pay

## 2018-04-18 DIAGNOSIS — Z510 Encounter for antineoplastic radiation therapy: Secondary | ICD-10-CM | POA: Diagnosis not present

## 2018-04-18 DIAGNOSIS — D0512 Intraductal carcinoma in situ of left breast: Secondary | ICD-10-CM | POA: Diagnosis not present

## 2018-04-18 DIAGNOSIS — C50412 Malignant neoplasm of upper-outer quadrant of left female breast: Secondary | ICD-10-CM | POA: Diagnosis not present

## 2018-04-18 DIAGNOSIS — Z170 Estrogen receptor positive status [ER+]: Secondary | ICD-10-CM | POA: Diagnosis not present

## 2018-04-18 NOTE — Discharge Summary (Signed)
[place] [facility] [place] NAME: Ellen [name]    MR#:  [id number]  DATE OF BIRTH:  February 01, 1930  DATE OF ADMISSION:  04/09/2018 ADMITTING PHYSICIAN: Saundra Shelling, MD  DATE OF DISCHARGE: 04/10/2018  1:31 PM  PRIMARY CARE PHYSICIAN: [name], DO   ADMISSION DIAGNOSIS:  Abdominal Pain  DISCHARGE DIAGNOSIS:  Active Problems:   Enteritis   SECONDARY DIAGNOSIS:   Past Medical History:  Diagnosis Date  . Arthritis   . Cancer ([place]) 01/2018   left breast   . Cataract   . Chronic headaches   . CKD (chronic kidney disease)   . Diverticulitis   . History of syncope   . Hypertension   . Multinodular goiter   . Primary osteoarthritis of left knee   . Pulmonary hypertension ([place])      ADMITTING HISTORY  HISTORY OF PRESENT ILLNESS: Ellen [name]  is a 85 y.o. female with a known history of breast cancer currently on radiation therapy, chronic kidney disease, hypertension, pulmonary hypertension, multinodular goiter presented to the emergency room for abdominal discomfort.  Abdominal pain started last night.  Pain is located around the umbilicus aching in nature 5 out of 10 on a scale of 1-10.  Patient has some nausea but no diarrhea.  No complaints of any rectal bleed.  No fever and chills.  She was evaluated in the emergency room with CT abdomen which showed enteritis.  Hospitalist service was consulted.  No history of recent travel, sick contacts at home.  WBC count is normal.  HOSPITAL COURSE:   *Acute enteritis.  Patient started on IV antibiotics in the emergency room.  Continued as inpatient.  Lactic acid level normal.  Surgery consulted.  CT scan of the abdomen showed normal vasculature.  Nothing acute other than enteritis.  WBC normalized.  Patient discharged home on oral antibiotic pills to follow-up with primary care physician.  Abdominal pain resolved by time of discharge.  Normal WBC.  Breast cancer.  Patient was scheduled  for radiation therapy on day of discharge as outpatient.  Patient stable for discharge home  CONSULTS OBTAINED:    DRUG ALLERGIES:   Allergies  Allergen Reactions  . Codeine Nausea Only  . Morphine And Related Nausea Only    DISCHARGE MEDICATIONS:   Allergies as of 04/10/2018      Reactions   Codeine Nausea Only   Morphine And Related Nausea Only      Medication List    TAKE these medications   acetaminophen 500 MG tablet Commonly known as:  TYLENOL Take 1,000 mg by mouth every 6 (six) hours as needed for moderate pain or headache.   amLODipine 5 MG tablet Commonly known as:  NORVASC Take 5 mg by mouth daily.   amoxicillin-clavulanate 875-125 MG tablet Commonly known as:  Augmentin Take 1 tablet by mouth 2 (two) times daily for 14 days.   aspirin 81 MG chewable tablet Chew 81 mg by mouth daily.   cloNIDine 0.1 MG tablet Commonly known as:  CATAPRES TAKE 1 TABLET BY MOUTH DAILY AS NEEDED FOR ELEVATED BLOOD PRESSURE GREATER THAN 180/100   gabapentin 100 MG capsule Commonly known as:  NEURONTIN Start 1 capsule daily, increase by 1 cap every 1 week as tolerated, next add 1 in evening, up to max 3 pills in evening What changed:    how much to take  how to take this  when to take this  additional instructions   lisinopril-hydrochlorothiazide 20-12.5  MG tablet Commonly known as:  PRINZIDE,ZESTORETIC Take 1 tablet by mouth daily.       Today   VITAL SIGNS:  Blood pressure (!) 113/55, pulse 65, temperature 98.6 F (37 C), temperature source Oral, resp. rate 20, height 5' (1.524 m), weight 71.6 kg, SpO2 97 %.  I/O:  No intake or output data in the 24 hours ending 04/18/18 1643  PHYSICAL EXAMINATION:  Physical Exam  GENERAL:  85 y.o.-year-old patient lying in the bed with no acute distress.  LUNGS: Normal breath sounds bilaterally, no wheezing, rales,rhonchi or crepitation. No use of accessory muscles of respiration.  CARDIOVASCULAR: S1, S2 normal. No  murmurs, rubs, or gallops.  ABDOMEN: Soft, non-tender, non-distended. Bowel sounds present. No organomegaly or mass.  NEUROLOGIC: Moves all 4 extremities. PSYCHIATRIC: The patient is alert and oriented x 3.  SKIN: No obvious rash, lesion, or ulcer.   DATA REVIEW:   CBC No results for input(s): WBC, HGB, HCT, PLT in the last 168 hours.  Chemistries  No results for input(s): NA, K, CL, CO2, GLUCOSE, BUN, CREATININE, CALCIUM, MG, AST, ALT, ALKPHOS, BILITOT in the last 168 hours.  Invalid input(s): GFRCGP  Cardiac Enzymes No results for input(s): TROPONINI in the last 168 hours.  Microbiology Results  Results for orders placed or performed during the hospital encounter of 09/18/17  Blood culture (routine x 2)     Status: Abnormal   Collection Time: 09/18/17  4:02 AM  Result Value Ref Range Status   Specimen Description   Final    BLOOD BLOOD RIGHT HAND Performed at [facility], [address] [place], [place] [address]    Special Requests   Final    BOTTLES DRAWN AEROBIC AND ANAEROBIC Blood Culture adequate volume Performed at [facility], [address] [place], [place] [address]    Culture  Setup Time   Final    GRAM POSITIVE RODS ANAEROBIC BOTTLE ONLY CRITICAL RESULT CALLED TO, READ BACK BY AND VERIFIED WITH: DAVID BESANTI AT [place] 09/19/17.PMH    Culture (A)  Final    DIPHTHEROIDS(CORYNEBACTERIUM SPECIES) Standardized susceptibility testing for this organism is not available. Performed at [facility], [place] [address] [place], [place] [address]    Report Status 09/21/2017 FINAL  Final  Blood culture (routine x 2)     Status: None   Collection Time: 09/18/17  4:02 AM  Result Value Ref Range Status   Specimen Description BLOOD RIGHT ANTECUBITAL  Final   Special Requests   Final    BOTTLES DRAWN AEROBIC AND ANAEROBIC Blood Culture adequate volume   Culture   Final    NO GROWTH 5 DAYS Performed at [facility], [address]  [place], [place] [address]    Report Status 09/23/2017 FINAL  Final  Blood Culture ID Panel (Reflexed)     Status: None   Collection Time: 09/18/17  4:02 AM  Result Value Ref Range Status   Enterococcus species NOT DETECTED NOT DETECTED Final   Listeria monocytogenes NOT DETECTED NOT DETECTED Final   Staphylococcus species NOT DETECTED NOT DETECTED Final   Staphylococcus aureus (BCID) NOT DETECTED NOT DETECTED Final   Streptococcus species NOT DETECTED NOT DETECTED Final   Streptococcus agalactiae NOT DETECTED NOT DETECTED Final   Streptococcus pneumoniae NOT DETECTED NOT DETECTED Final   Streptococcus pyogenes NOT DETECTED NOT DETECTED Final   Acinetobacter baumannii NOT DETECTED NOT DETECTED Final   Enterobacteriaceae species NOT DETECTED NOT DETECTED Final   Enterobacter cloacae  complex NOT DETECTED NOT DETECTED Final   Escherichia coli NOT DETECTED NOT DETECTED Final   Klebsiella oxytoca NOT DETECTED NOT DETECTED Final   Klebsiella pneumoniae NOT DETECTED NOT DETECTED Final   Proteus species NOT DETECTED NOT DETECTED Final   Serratia marcescens NOT DETECTED NOT DETECTED Final   Haemophilus influenzae NOT DETECTED NOT DETECTED Final   Neisseria meningitidis NOT DETECTED NOT DETECTED Final   Pseudomonas aeruginosa NOT DETECTED NOT DETECTED Final   Candida albicans NOT DETECTED NOT DETECTED Final   Candida glabrata NOT DETECTED NOT DETECTED Final   Candida krusei NOT DETECTED NOT DETECTED Final   Candida parapsilosis NOT DETECTED NOT DETECTED Final   Candida tropicalis NOT DETECTED NOT DETECTED Final    Comment: Performed at [facility], [address] [place], [place] [address]    RADIOLOGY:  No results found.  Follow up with PCP in 1 week.  Management plans discussed with the patient, family and they are in agreement.  CODE STATUS:  Code Status History    Date Active Date Inactive Code Status Order ID Comments User Context   04/09/2018 1723 04/10/2018 1636 Full Code  [id number]  Saundra Shelling, MD Inpatient   09/18/2017 0700 09/19/2017 1623 Full Code [id number]  Arta Silence, MD Inpatient      TOTAL TIME TAKING CARE OF THIS PATIENT ON DAY OF DISCHARGE: more than 30 minutes.   Neita Carp M.D on 04/18/2018 at 4:43 PM  Between 7am to 6pm - Pager - [phone]  After 6pm go to [email or web address] - password [email or web address] [place] [facility]  Office  [phone]  CC: Primary care physician; [name], DO  Note: This dictation was prepared with Dragon dictation along with smaller phrase technology. Any transcriptional errors that result from this process are unintentional.

## 2018-04-19 ENCOUNTER — Other Ambulatory Visit: Payer: Self-pay

## 2018-04-19 ENCOUNTER — Ambulatory Visit
Admission: RE | Admit: 2018-04-19 | Discharge: 2018-04-19 | Disposition: A | Discharge status: Home / Self Care | Payer: Medicare Other | Source: Ambulatory Visit | Attending: Radiation Oncology | Admitting: Radiation Oncology

## 2018-04-19 DIAGNOSIS — D0512 Intraductal carcinoma in situ of left breast: Secondary | ICD-10-CM | POA: Diagnosis not present

## 2018-04-19 DIAGNOSIS — Z170 Estrogen receptor positive status [ER+]: Secondary | ICD-10-CM | POA: Diagnosis not present

## 2018-04-19 DIAGNOSIS — C50412 Malignant neoplasm of upper-outer quadrant of left female breast: Secondary | ICD-10-CM | POA: Diagnosis not present

## 2018-04-19 DIAGNOSIS — Z510 Encounter for antineoplastic radiation therapy: Secondary | ICD-10-CM | POA: Diagnosis not present

## 2018-04-20 ENCOUNTER — Other Ambulatory Visit: Payer: Self-pay

## 2018-04-20 ENCOUNTER — Ambulatory Visit
Admission: RE | Admit: 2018-04-20 | Discharge: 2018-04-20 | Disposition: A | Discharge status: Home / Self Care | Payer: Medicare Other | Source: Ambulatory Visit | Attending: Radiation Oncology | Admitting: Radiation Oncology

## 2018-04-20 ENCOUNTER — Other Ambulatory Visit: Payer: Medicare Other

## 2018-04-20 DIAGNOSIS — Z170 Estrogen receptor positive status [ER+]: Secondary | ICD-10-CM | POA: Diagnosis not present

## 2018-04-20 DIAGNOSIS — Z510 Encounter for antineoplastic radiation therapy: Secondary | ICD-10-CM | POA: Diagnosis not present

## 2018-04-20 DIAGNOSIS — D0512 Intraductal carcinoma in situ of left breast: Secondary | ICD-10-CM | POA: Diagnosis not present

## 2018-04-20 DIAGNOSIS — C50412 Malignant neoplasm of upper-outer quadrant of left female breast: Secondary | ICD-10-CM | POA: Diagnosis not present

## 2018-04-21 ENCOUNTER — Ambulatory Visit
Admission: RE | Admit: 2018-04-21 | Discharge: 2018-04-21 | Disposition: A | Discharge status: Home / Self Care | Payer: Medicare Other | Source: Ambulatory Visit | Attending: Radiation Oncology | Admitting: Radiation Oncology

## 2018-04-21 ENCOUNTER — Other Ambulatory Visit: Payer: Self-pay

## 2018-04-21 DIAGNOSIS — Z510 Encounter for antineoplastic radiation therapy: Secondary | ICD-10-CM | POA: Diagnosis not present

## 2018-04-21 DIAGNOSIS — Z170 Estrogen receptor positive status [ER+]: Secondary | ICD-10-CM | POA: Diagnosis not present

## 2018-04-21 DIAGNOSIS — C50412 Malignant neoplasm of upper-outer quadrant of left female breast: Secondary | ICD-10-CM | POA: Diagnosis not present

## 2018-04-21 DIAGNOSIS — D0512 Intraductal carcinoma in situ of left breast: Secondary | ICD-10-CM | POA: Diagnosis not present

## 2018-04-24 ENCOUNTER — Other Ambulatory Visit: Payer: Self-pay

## 2018-04-24 ENCOUNTER — Ambulatory Visit
Admission: RE | Admit: 2018-04-24 | Discharge: 2018-04-24 | Disposition: A | Discharge status: Home / Self Care | Payer: Medicare Other | Source: Ambulatory Visit | Attending: Radiation Oncology | Admitting: Radiation Oncology

## 2018-04-24 ENCOUNTER — Inpatient Hospital Stay: Payer: Medicare Other | Attending: Radiation Oncology

## 2018-04-24 DIAGNOSIS — D0512 Intraductal carcinoma in situ of left breast: Secondary | ICD-10-CM

## 2018-04-24 DIAGNOSIS — C50412 Malignant neoplasm of upper-outer quadrant of left female breast: Secondary | ICD-10-CM | POA: Insufficient documentation

## 2018-04-24 DIAGNOSIS — Z510 Encounter for antineoplastic radiation therapy: Secondary | ICD-10-CM | POA: Diagnosis not present

## 2018-04-24 DIAGNOSIS — Z170 Estrogen receptor positive status [ER+]: Secondary | ICD-10-CM | POA: Diagnosis not present

## 2018-04-24 LAB — CBC
HCT: 35.9 % — ABNORMAL LOW (ref 36.0–46.0)
Hemoglobin: 11.3 g/dL — ABNORMAL LOW (ref 12.0–15.0)
MCH: 26.9 pg (ref 26.0–34.0)
MCHC: 31.5 g/dL (ref 30.0–36.0)
MCV: 85.5 fL (ref 80.0–100.0)
Platelets: 324 10*3/uL (ref 150–400)
RBC: 4.2 MIL/uL (ref 3.87–5.11)
RDW: 18 % — ABNORMAL HIGH (ref 11.5–15.5)
WBC: 6.1 10*3/uL (ref 4.0–10.5)
nRBC: 0 % (ref 0.0–0.2)

## 2018-04-25 ENCOUNTER — Ambulatory Visit
Admission: RE | Admit: 2018-04-25 | Discharge: 2018-04-25 | Disposition: A | Discharge status: Home / Self Care | Payer: Medicare Other | Source: Ambulatory Visit | Attending: Radiation Oncology | Admitting: Radiation Oncology

## 2018-04-25 ENCOUNTER — Other Ambulatory Visit: Payer: Self-pay

## 2018-04-25 DIAGNOSIS — D0512 Intraductal carcinoma in situ of left breast: Secondary | ICD-10-CM | POA: Diagnosis not present

## 2018-04-25 DIAGNOSIS — Z510 Encounter for antineoplastic radiation therapy: Secondary | ICD-10-CM | POA: Diagnosis not present

## 2018-04-25 DIAGNOSIS — C50412 Malignant neoplasm of upper-outer quadrant of left female breast: Secondary | ICD-10-CM | POA: Diagnosis not present

## 2018-04-25 DIAGNOSIS — Z170 Estrogen receptor positive status [ER+]: Secondary | ICD-10-CM | POA: Diagnosis not present

## 2018-04-26 ENCOUNTER — Other Ambulatory Visit: Payer: Self-pay

## 2018-04-26 ENCOUNTER — Ambulatory Visit
Admission: RE | Admit: 2018-04-26 | Discharge: 2018-04-26 | Disposition: A | Discharge status: Home / Self Care | Payer: Medicare Other | Source: Ambulatory Visit | Attending: Radiation Oncology | Admitting: Radiation Oncology

## 2018-04-26 DIAGNOSIS — Z170 Estrogen receptor positive status [ER+]: Secondary | ICD-10-CM | POA: Diagnosis not present

## 2018-04-26 DIAGNOSIS — D0512 Intraductal carcinoma in situ of left breast: Secondary | ICD-10-CM | POA: Diagnosis not present

## 2018-04-26 DIAGNOSIS — C50412 Malignant neoplasm of upper-outer quadrant of left female breast: Secondary | ICD-10-CM | POA: Diagnosis not present

## 2018-04-26 DIAGNOSIS — Z510 Encounter for antineoplastic radiation therapy: Secondary | ICD-10-CM | POA: Diagnosis not present

## 2018-04-27 ENCOUNTER — Ambulatory Visit
Admission: RE | Admit: 2018-04-27 | Discharge: 2018-04-27 | Disposition: A | Discharge status: Home / Self Care | Payer: Medicare Other | Source: Ambulatory Visit | Attending: Radiation Oncology | Admitting: Radiation Oncology

## 2018-04-27 ENCOUNTER — Other Ambulatory Visit: Payer: Self-pay

## 2018-04-27 DIAGNOSIS — Z170 Estrogen receptor positive status [ER+]: Secondary | ICD-10-CM | POA: Diagnosis not present

## 2018-04-27 DIAGNOSIS — C50412 Malignant neoplasm of upper-outer quadrant of left female breast: Secondary | ICD-10-CM | POA: Diagnosis not present

## 2018-04-27 DIAGNOSIS — D0512 Intraductal carcinoma in situ of left breast: Secondary | ICD-10-CM | POA: Diagnosis not present

## 2018-04-27 DIAGNOSIS — Z510 Encounter for antineoplastic radiation therapy: Secondary | ICD-10-CM | POA: Diagnosis not present

## 2018-04-28 ENCOUNTER — Ambulatory Visit
Admission: RE | Admit: 2018-04-28 | Discharge: 2018-04-28 | Disposition: A | Discharge status: Home / Self Care | Payer: Medicare Other | Source: Ambulatory Visit | Attending: Radiation Oncology | Admitting: Radiation Oncology

## 2018-04-28 ENCOUNTER — Other Ambulatory Visit: Payer: Self-pay

## 2018-04-28 DIAGNOSIS — D0512 Intraductal carcinoma in situ of left breast: Secondary | ICD-10-CM | POA: Diagnosis not present

## 2018-04-28 DIAGNOSIS — Z170 Estrogen receptor positive status [ER+]: Secondary | ICD-10-CM | POA: Diagnosis not present

## 2018-04-28 DIAGNOSIS — Z510 Encounter for antineoplastic radiation therapy: Secondary | ICD-10-CM | POA: Diagnosis not present

## 2018-04-28 DIAGNOSIS — C50412 Malignant neoplasm of upper-outer quadrant of left female breast: Secondary | ICD-10-CM | POA: Diagnosis not present

## 2018-05-01 ENCOUNTER — Ambulatory Visit
Admission: RE | Admit: 2018-05-01 | Discharge: 2018-05-01 | Disposition: A | Discharge status: Home / Self Care | Payer: Medicare Other | Source: Ambulatory Visit | Attending: Radiation Oncology | Admitting: Radiation Oncology

## 2018-05-01 ENCOUNTER — Other Ambulatory Visit: Payer: Self-pay

## 2018-05-01 ENCOUNTER — Other Ambulatory Visit: Payer: Self-pay | Admitting: *Deleted

## 2018-05-01 DIAGNOSIS — D0512 Intraductal carcinoma in situ of left breast: Secondary | ICD-10-CM | POA: Diagnosis not present

## 2018-05-01 DIAGNOSIS — Z170 Estrogen receptor positive status [ER+]: Secondary | ICD-10-CM | POA: Diagnosis not present

## 2018-05-01 DIAGNOSIS — C50412 Malignant neoplasm of upper-outer quadrant of left female breast: Secondary | ICD-10-CM | POA: Diagnosis not present

## 2018-05-01 DIAGNOSIS — Z510 Encounter for antineoplastic radiation therapy: Secondary | ICD-10-CM | POA: Diagnosis not present

## 2018-05-01 MED ORDER — SULFAMETHOXAZOLE-TRIMETHOPRIM 800-160 MG PO TABS: 1.0000 | ORAL_TABLET | Freq: Two times a day (BID) | ORAL | 0 refills | Status: DC

## 2018-05-02 ENCOUNTER — Ambulatory Visit
Admission: RE | Admit: 2018-05-02 | Discharge: 2018-05-02 | Disposition: A | Discharge status: Home / Self Care | Payer: Medicare Other | Source: Ambulatory Visit | Attending: Radiation Oncology | Admitting: Radiation Oncology

## 2018-05-02 ENCOUNTER — Other Ambulatory Visit: Payer: Self-pay

## 2018-05-02 DIAGNOSIS — D0512 Intraductal carcinoma in situ of left breast: Secondary | ICD-10-CM | POA: Diagnosis not present

## 2018-05-02 DIAGNOSIS — Z510 Encounter for antineoplastic radiation therapy: Secondary | ICD-10-CM | POA: Diagnosis not present

## 2018-05-02 DIAGNOSIS — Z170 Estrogen receptor positive status [ER+]: Secondary | ICD-10-CM | POA: Diagnosis not present

## 2018-05-02 DIAGNOSIS — C50412 Malignant neoplasm of upper-outer quadrant of left female breast: Secondary | ICD-10-CM | POA: Diagnosis not present

## 2018-05-03 ENCOUNTER — Other Ambulatory Visit: Payer: Self-pay

## 2018-05-03 ENCOUNTER — Ambulatory Visit
Admission: RE | Admit: 2018-05-03 | Discharge: 2018-05-03 | Disposition: A | Discharge status: Home / Self Care | Payer: Medicare Other | Source: Ambulatory Visit | Attending: Radiation Oncology | Admitting: Radiation Oncology

## 2018-05-03 DIAGNOSIS — Z170 Estrogen receptor positive status [ER+]: Secondary | ICD-10-CM | POA: Insufficient documentation

## 2018-05-03 DIAGNOSIS — Z510 Encounter for antineoplastic radiation therapy: Secondary | ICD-10-CM | POA: Diagnosis not present

## 2018-05-03 DIAGNOSIS — C50412 Malignant neoplasm of upper-outer quadrant of left female breast: Secondary | ICD-10-CM | POA: Diagnosis not present

## 2018-05-04 ENCOUNTER — Other Ambulatory Visit: Payer: Medicare Other

## 2018-05-04 ENCOUNTER — Other Ambulatory Visit: Payer: Self-pay

## 2018-05-04 ENCOUNTER — Ambulatory Visit
Admission: RE | Admit: 2018-05-04 | Discharge: 2018-05-04 | Disposition: A | Discharge status: Home / Self Care | Payer: Medicare Other | Source: Ambulatory Visit | Attending: Radiation Oncology | Admitting: Radiation Oncology

## 2018-05-04 DIAGNOSIS — Z170 Estrogen receptor positive status [ER+]: Secondary | ICD-10-CM | POA: Diagnosis not present

## 2018-05-04 DIAGNOSIS — Z510 Encounter for antineoplastic radiation therapy: Secondary | ICD-10-CM | POA: Diagnosis not present

## 2018-05-04 DIAGNOSIS — C50412 Malignant neoplasm of upper-outer quadrant of left female breast: Secondary | ICD-10-CM | POA: Diagnosis not present

## 2018-05-05 ENCOUNTER — Ambulatory Visit
Admission: RE | Admit: 2018-05-05 | Discharge: 2018-05-05 | Disposition: A | Discharge status: Home / Self Care | Payer: Medicare Other | Source: Ambulatory Visit | Attending: Radiation Oncology | Admitting: Radiation Oncology

## 2018-05-05 ENCOUNTER — Other Ambulatory Visit: Payer: Self-pay

## 2018-05-05 DIAGNOSIS — Z510 Encounter for antineoplastic radiation therapy: Secondary | ICD-10-CM | POA: Diagnosis not present

## 2018-05-05 DIAGNOSIS — C50412 Malignant neoplasm of upper-outer quadrant of left female breast: Secondary | ICD-10-CM | POA: Diagnosis not present

## 2018-05-05 DIAGNOSIS — Z170 Estrogen receptor positive status [ER+]: Secondary | ICD-10-CM | POA: Diagnosis not present

## 2018-05-08 ENCOUNTER — Inpatient Hospital Stay: Payer: Medicare Other | Attending: Radiation Oncology

## 2018-05-08 ENCOUNTER — Other Ambulatory Visit: Payer: Self-pay

## 2018-05-08 ENCOUNTER — Encounter: Payer: Self-pay | Admitting: *Deleted

## 2018-05-08 ENCOUNTER — Ambulatory Visit
Admission: RE | Admit: 2018-05-08 | Discharge: 2018-05-08 | Disposition: A | Discharge status: Home / Self Care | Payer: Medicare Other | Source: Ambulatory Visit | Attending: Radiation Oncology | Admitting: Radiation Oncology

## 2018-05-08 DIAGNOSIS — C50412 Malignant neoplasm of upper-outer quadrant of left female breast: Secondary | ICD-10-CM | POA: Insufficient documentation

## 2018-05-08 DIAGNOSIS — D0512 Intraductal carcinoma in situ of left breast: Secondary | ICD-10-CM | POA: Diagnosis not present

## 2018-05-08 DIAGNOSIS — Z510 Encounter for antineoplastic radiation therapy: Secondary | ICD-10-CM | POA: Diagnosis not present

## 2018-05-08 DIAGNOSIS — Z170 Estrogen receptor positive status [ER+]: Secondary | ICD-10-CM | POA: Diagnosis not present

## 2018-05-08 LAB — CBC
HCT: 35.6 % — ABNORMAL LOW (ref 36.0–46.0)
Hemoglobin: 11.4 g/dL — ABNORMAL LOW (ref 12.0–15.0)
MCH: 27.1 pg (ref 26.0–34.0)
MCHC: 32 g/dL (ref 30.0–36.0)
MCV: 84.8 fL (ref 80.0–100.0)
Platelets: 256 10*3/uL (ref 150–400)
RBC: 4.2 MIL/uL (ref 3.87–5.11)
RDW: 17.6 % — ABNORMAL HIGH (ref 11.5–15.5)
WBC: 5.5 10*3/uL (ref 4.0–10.5)
nRBC: 0 % (ref 0.0–0.2)

## 2018-05-08 NOTE — Progress Notes (Signed)
Called patient today to follow up on her treatments.  States this is her last week of radiation therapy.  States she needs to go out and mow her yard.  Discussed that we have a fund at [employer]  That could provide that service for her.  Talked to [place]ique Wood at [employer].  She is going to call the patient by Wednesday to set up that  Service for her.  Patient aware she will get a call from [place] to scheduled her service.

## 2018-05-09 ENCOUNTER — Other Ambulatory Visit: Payer: Self-pay

## 2018-05-09 ENCOUNTER — Ambulatory Visit
Admission: RE | Admit: 2018-05-09 | Discharge: 2018-05-09 | Disposition: A | Discharge status: Home / Self Care | Payer: Medicare Other | Source: Ambulatory Visit | Attending: Radiation Oncology | Admitting: Radiation Oncology

## 2018-05-09 DIAGNOSIS — Z170 Estrogen receptor positive status [ER+]: Secondary | ICD-10-CM | POA: Diagnosis not present

## 2018-05-09 DIAGNOSIS — Z510 Encounter for antineoplastic radiation therapy: Secondary | ICD-10-CM | POA: Diagnosis not present

## 2018-05-09 DIAGNOSIS — C50412 Malignant neoplasm of upper-outer quadrant of left female breast: Secondary | ICD-10-CM | POA: Diagnosis not present

## 2018-05-10 ENCOUNTER — Ambulatory Visit
Admission: RE | Admit: 2018-05-10 | Discharge: 2018-05-10 | Disposition: A | Discharge status: Home / Self Care | Payer: Medicare Other | Source: Ambulatory Visit | Attending: Radiation Oncology | Admitting: Radiation Oncology

## 2018-05-10 ENCOUNTER — Other Ambulatory Visit: Payer: Self-pay

## 2018-05-10 DIAGNOSIS — Z170 Estrogen receptor positive status [ER+]: Secondary | ICD-10-CM | POA: Diagnosis not present

## 2018-05-10 DIAGNOSIS — C50412 Malignant neoplasm of upper-outer quadrant of left female breast: Secondary | ICD-10-CM | POA: Diagnosis not present

## 2018-05-10 DIAGNOSIS — Z510 Encounter for antineoplastic radiation therapy: Secondary | ICD-10-CM | POA: Diagnosis not present

## 2018-05-11 ENCOUNTER — Encounter: Payer: Self-pay | Admitting: *Deleted

## 2018-05-11 ENCOUNTER — Ambulatory Visit
Admission: RE | Admit: 2018-05-11 | Discharge: 2018-05-11 | Disposition: A | Discharge status: Home / Self Care | Payer: Medicare Other | Source: Ambulatory Visit | Attending: Radiation Oncology | Admitting: Radiation Oncology

## 2018-05-11 ENCOUNTER — Other Ambulatory Visit: Payer: Self-pay

## 2018-05-11 DIAGNOSIS — Z170 Estrogen receptor positive status [ER+]: Secondary | ICD-10-CM | POA: Diagnosis not present

## 2018-05-11 DIAGNOSIS — C50412 Malignant neoplasm of upper-outer quadrant of left female breast: Secondary | ICD-10-CM | POA: Diagnosis not present

## 2018-05-11 DIAGNOSIS — Z510 Encounter for antineoplastic radiation therapy: Secondary | ICD-10-CM | POA: Diagnosis not present

## 2018-05-11 NOTE — Progress Notes (Signed)
Patient informed that [facility] Service will come by next week to mow her yard.  They will also come by a few more time to mow until her $500 limit is met.

## 2018-05-12 ENCOUNTER — Other Ambulatory Visit: Payer: Self-pay

## 2018-05-12 ENCOUNTER — Inpatient Hospital Stay: Payer: Medicare Other | Admitting: Oncology

## 2018-05-12 ENCOUNTER — Ambulatory Visit
Admission: RE | Admit: 2018-05-12 | Discharge: 2018-05-12 | Disposition: A | Discharge status: Home / Self Care | Payer: Medicare Other | Source: Ambulatory Visit | Attending: Radiation Oncology | Admitting: Radiation Oncology

## 2018-05-12 DIAGNOSIS — D0512 Intraductal carcinoma in situ of left breast: Secondary | ICD-10-CM | POA: Diagnosis not present

## 2018-05-12 DIAGNOSIS — Z170 Estrogen receptor positive status [ER+]: Secondary | ICD-10-CM | POA: Diagnosis not present

## 2018-05-12 DIAGNOSIS — Z510 Encounter for antineoplastic radiation therapy: Secondary | ICD-10-CM | POA: Diagnosis not present

## 2018-05-12 DIAGNOSIS — C50412 Malignant neoplasm of upper-outer quadrant of left female breast: Secondary | ICD-10-CM | POA: Diagnosis not present

## 2018-05-14 NOTE — Progress Notes (Signed)
[place]  [phone] [phone]) [phone]  ID: Ellen [name] OB: [birth year]  MR#: [id number]  JME#:[id number]  Patient Care Team: [name], DO as PCP - General (Family Medicine)  I connected with Ellen [name] on 05/14/18 at  2:00 PM EDT by telephone visit and verified that I am speaking with the correct person using two identifiers.   I discussed the limitations, risks, security and privacy concerns of performing an evaluation and management service by telemedicine and the availability of in-person appointments. I also discussed with the patient that there may be a patient responsible charge related to this service. The patient expressed understanding and agreed to proceed.   Other persons participating in the visit and their role in the encounter: Nursing  Patient's location: Home Provider's location: Clinic  CHIEF COMPLAINT: Stage Ia ER/PR positive, HER-2 negative invasive carcinoma of the left upper outer quadrant breast.   INTERVAL HISTORY: Patient agreed for evaluation by telephone today.  She recently completed XRT and tolerated treatment relatively well other than skin damage.  She otherwise feels well and is asymptomatic.  She continues to remain active and spent much of the morning in her garden.  She has no neurologic complaints.  She denies any recent fevers or illnesses.  She has a good appetite and denies weight loss.  She denies any chest pain, shortness of breath, cough, or hemoptysis.  She denies any nausea, vomiting, constipation, or diarrhea.  She has no urinary complaints.  Patient offers no further specific complaints today.  REVIEW OF SYSTEMS:   Review of Systems  Constitutional: Negative.  Negative for fever, malaise/fatigue and weight loss.  Respiratory: Negative.  Negative for cough, hemoptysis and shortness of breath.   Cardiovascular: Negative.  Negative for chest pain and leg swelling.  Gastrointestinal:  Negative.  Negative for abdominal pain, blood in stool and melena.  Genitourinary: Negative.  Negative for dysuria.  Musculoskeletal: Negative.  Negative for back pain.  Skin: Negative.  Negative for rash.  Neurological: Negative.  Negative for focal weakness, weakness and headaches.  Psychiatric/Behavioral: Negative.  The patient is not nervous/anxious.     As per HPI. Otherwise, a complete review of systems is negative.  PAST MEDICAL HISTORY: Past Medical History:  Diagnosis Date  . Arthritis   . Cancer ([place]) 01/2018   left breast   . Cataract   . Chronic headaches   . CKD (chronic kidney disease)   . Diverticulitis   . History of syncope   . Hypertension   . Multinodular goiter   . Primary osteoarthritis of left knee   . Pulmonary hypertension ([place])     PAST SURGICAL HISTORY: Past Surgical History:  Procedure Laterality Date  . ABDOMINAL HYSTERECTOMY    . APPENDECTOMY    . BREAST BIOPSY Left 01/03/2018   left breast stereo/x clip/path pending  . BREAST LUMPECTOMY Left 01/12/2018  . BREAST LUMPECTOMY WITH NEEDLE LOCALIZATION Left 01/12/2018   Procedure: BREAST LUMPECTOMY WITH NEEDLE LOCALIZATION;  Surgeon: Jules Husbands, MD;  Location: [facility] ORS;  Service: General;  Laterality: Left;  . CHOLECYSTECTOMY    . COLON SURGERY    . COLOSTOMY  2008  . COLOSTOMY REVERSAL  2008  . EYE SURGERY    . REPLACEMENT TOTAL KNEE Right 2018  . SENTINEL NODE BIOPSY Left 03/07/2018   Procedure: SENTINEL NODE BIOPSY;  Surgeon: Jules Husbands, MD;  Location: [facility] ORS;  Service: General;  Laterality: Left;    FAMILY HISTORY: Family History  Problem Relation  Age of Onset  . Lung cancer Mother   . Diabetes Father   . Stroke Father   . Hypertension Maternal Grandmother   . Hypertension Maternal Uncle   . Hypertension Maternal Aunt   . Ovarian cancer Maternal Aunt   . Heart disease Daughter     ADVANCED DIRECTIVES (Y/N):  N  HEALTH MAINTENANCE: Social History   Tobacco Use  .  Smoking status: Never Smoker  . Smokeless tobacco: Never Used  Substance Use Topics  . Alcohol use: Never    Frequency: Never  . Drug use: Never     Colonoscopy:  PAP:  Bone density:  Lipid panel:  Allergies  Allergen Reactions  . Codeine Nausea Only  . Morphine And Related Nausea Only    Current Outpatient Medications  Medication Sig Dispense Refill  . acetaminophen (TYLENOL) 500 MG tablet Take 1,000 mg by mouth every 6 (six) hours as needed for moderate pain or headache.     Marland Kitchen amLODipine (NORVASC) 5 MG tablet Take 5 mg by mouth daily.     Marland Kitchen aspirin 81 MG chewable tablet Chew 81 mg by mouth daily.     . cloNIDine (CATAPRES) 0.1 MG tablet TAKE 1 TABLET BY MOUTH DAILY AS NEEDED FOR ELEVATED BLOOD PRESSURE GREATER THAN 180/100 90 tablet 1  . gabapentin (NEURONTIN) 100 MG capsule Start 1 capsule daily, increase by 1 cap every 1 week as tolerated, next add 1 in evening, up to max 3 pills in evening (Patient taking differently: Take 100 mg by mouth daily. ) 90 capsule 1  . lisinopril-hydrochlorothiazide (PRINZIDE,ZESTORETIC) 20-12.5 MG tablet Take 1 tablet by mouth daily. 30 tablet 3  . pravastatin (PRAVACHOL) 20 MG tablet Take 1 tablet (20 mg total) by mouth at bedtime. 90 tablet 3  . sulfamethoxazole-trimethoprim (BACTRIM DS,SEPTRA DS) 800-160 MG tablet Take 1 tablet by mouth 2 (two) times daily. 14 tablet 0   No current facility-administered medications for this visit.     OBJECTIVE: There were no vitals filed for this visit.   There is no height or weight on file to calculate BMI.    ECOG FS:0 - Asymptomatic   LAB RESULTS:  Lab Results  Component Value Date   NA 139 04/10/2018   K 3.5 04/10/2018   CL 105 04/10/2018   CO2 26 04/10/2018   GLUCOSE 84 04/10/2018   BUN 18 04/10/2018   CREATININE 1.08 (H) 04/10/2018   CALCIUM 8.5 (L) 04/10/2018   PROT 7.1 04/09/2018   ALBUMIN 3.1 (L) 04/09/2018   AST 24 04/09/2018   ALT 9 04/09/2018   ALKPHOS 42 04/09/2018   BILITOT 0.9  04/09/2018   GFRNONAA 46 (L) 04/10/2018   GFRAA 54 (L) 04/10/2018    Lab Results  Component Value Date   WBC 5.5 05/08/2018   NEUTROABS 2.4 08/06/2017   HGB 11.4 (L) 05/08/2018   HCT 35.6 (L) 05/08/2018   MCV 84.8 05/08/2018   PLT 256 05/08/2018     STUDIES: No results found.  ASSESSMENT: Stage Ia ER/PR positive, HER-2 negative invasive carcinoma of the left upper outer quadrant breast.    PLAN:  1. Stage Ia ER/PR positive, HER-2 negative invasive carcinoma of the left upper outer quadrant breast: Given patient's advanced age, Oncotype DX was not ordered since patient declined chemotherapy.  She recently completed adjuvant XRT.  Patient was given a prescription for letrozole today which she will take for a total of 5 years completing in April 2025.  Patient will require a baseline bone  mineral density in the next several months once restrictions are lifted from the COVID-19 pandemic.  No further interventions are needed.  Return to clinic in 3 months for routine evaluation.  I provided 25 minutes of non face-to-face telephone visit time during this encounter, and > 50% was spent counseling as documented under my assessment & plan.   Patient expressed understanding and was in agreement with this plan. She also understands that She can call clinic at any time with any questions, concerns, or complaints.   Cancer Staging Primary cancer of upper outer quadrant of left female breast [facility]) Staging form: Breast, AJCC 8th Edition - Clinical stage from 01/11/2018: Stage IA (cT1b, cN0, cM0, G2, ER+, PR+, HER2-) - Signed by Lloyd Huger, MD on 02/10/2018   Lloyd Huger, MD   05/14/2018 8:48 PM

## 2018-05-15 ENCOUNTER — Ambulatory Visit: Payer: Medicare Other | Admitting: Oncology

## 2018-05-15 ENCOUNTER — Other Ambulatory Visit: Payer: Self-pay

## 2018-05-15 ENCOUNTER — Inpatient Hospital Stay (HOSPITAL_BASED_OUTPATIENT_CLINIC_OR_DEPARTMENT_OTHER): Payer: Medicare Other | Admitting: Oncology

## 2018-05-15 DIAGNOSIS — Z170 Estrogen receptor positive status [ER+]: Secondary | ICD-10-CM | POA: Diagnosis not present

## 2018-05-15 DIAGNOSIS — Z923 Personal history of irradiation: Secondary | ICD-10-CM

## 2018-05-15 DIAGNOSIS — C50412 Malignant neoplasm of upper-outer quadrant of left female breast: Secondary | ICD-10-CM

## 2018-05-15 MED ORDER — LETROZOLE 2.5 MG PO TABS: 2.5000 mg | ORAL_TABLET | Freq: Every day | ORAL | 3 refills | Status: DC

## 2018-05-15 NOTE — Progress Notes (Signed)
Patient for follow up breast cancer. Patient reports radiation burns, is using hydrocortisone.

## 2018-06-05 DIAGNOSIS — R51 Headache: Secondary | ICD-10-CM | POA: Diagnosis not present

## 2018-06-06 ENCOUNTER — Other Ambulatory Visit: Payer: Self-pay

## 2018-06-07 ENCOUNTER — Ambulatory Visit
Admission: RE | Admit: 2018-06-07 | Discharge: 2018-06-07 | Disposition: A | Discharge status: Home / Self Care | Payer: Medicare Other | Source: Ambulatory Visit | Attending: Radiation Oncology | Admitting: Radiation Oncology

## 2018-06-07 ENCOUNTER — Telehealth: Payer: Self-pay | Admitting: Licensed Clinical Social Worker

## 2018-06-07 ENCOUNTER — Other Ambulatory Visit: Payer: Self-pay

## 2018-06-07 ENCOUNTER — Encounter: Payer: Self-pay | Admitting: Radiation Oncology

## 2018-06-07 DIAGNOSIS — D0512 Intraductal carcinoma in situ of left breast: Secondary | ICD-10-CM | POA: Diagnosis not present

## 2018-06-07 DIAGNOSIS — Z923 Personal history of irradiation: Secondary | ICD-10-CM | POA: Diagnosis not present

## 2018-06-07 DIAGNOSIS — Z170 Estrogen receptor positive status [ER+]: Secondary | ICD-10-CM | POA: Insufficient documentation

## 2018-06-07 NOTE — Progress Notes (Signed)
Radiation Oncology Follow up Note  Name: Ellen [name]   Date:   06/07/2018 MRN:  [id number] DOB: 1930/01/21    This 85 y.o. female presents to the clinic today for 1 month follow-up status post whole breast radiation to her left breast for invasive mammary carcinoma ER PR positive.  REFERRING PROVIDER: [name] *  HPI: Patient is an 85 year old female now at 1 month having completed whole breast radiation to her left breast for stage I ER PR positive invasive mammary carcinoma.  She is seen today in routine follow-up is doing well.  Specifically denies breast tenderness cough or bone pain.  She was offered antiestrogen therapy although based on her interpretation of the side effect profile she has declined that treatment..  COMPLICATIONS OF TREATMENT: none  FOLLOW UP COMPLIANCE: keeps appointments   PHYSICAL EXAM:  BP (!) (P) 147/74 (BP Location: Left Arm, Patient Position: Sitting)   Pulse (P) 61   Temp (P) 97.7 F (36.5 C) (Tympanic)   Resp (P) 16   Wt (P) 153 lb 14.1 oz (69.8 kg)   BMI (P) 30.05 kg/m  Lungs are clear to A&P cardiac examination essentially unremarkable with regular rate and rhythm. No dominant mass or nodularity is noted in either breast in 2 positions examined. Incision is well-healed. No axillary or supraclavicular adenopathy is appreciated. Cosmetic result is excellent.  Still some slight hyperpigmentation of the skin which is clearing nicely.  Well-developed well-nourished patient in NAD. HEENT reveals PERLA, EOMI, discs not visualized.  Oral cavity is clear. No oral mucosal lesions are identified. Neck is clear without evidence of cervical or supraclavicular adenopathy. Lungs are clear to A&P. Cardiac examination is essentially unremarkable with regular rate and rhythm without murmur rub or thrill. Abdomen is benign with no organomegaly or masses noted. Motor sensory and DTR levels are equal and symmetric in the upper and lower extremities. Cranial  nerves II through XII are grossly intact. Proprioception is intact. No peripheral adenopathy or edema is identified. No motor or sensory levels are noted. Crude visual fields are within normal range.  RADIOLOGY RESULTS: No current films for review  PLAN: Present time patient is doing well.  I have again discussed with her the benefits of antiestrogen therapy although the patient is not interested in that therapy.  I have asked to see her back in 4 to 5 months for follow-up.  She continues close follow-up care with medical oncology.  Patient knows to call with any concerns at any time.  I would like to take this opportunity to thank you for allowing me to participate in the care of your patient.Noreene Filbert, MD

## 2018-06-12 NOTE — Telephone Encounter (Signed)
Received chat message that Ms. Postlewaite did not want to keep her genetics appointment in [place], but would prefer to be seen at [facility]. Called her to offer a virtual appointment, she declined and prefers to be seen in person. She is scheduled for August 27th at 1:30 PM.

## 2018-06-13 ENCOUNTER — Other Ambulatory Visit: Payer: Self-pay | Admitting: Nurse Practitioner

## 2018-06-13 DIAGNOSIS — I10 Essential (primary) hypertension: Secondary | ICD-10-CM

## 2018-06-16 ENCOUNTER — Encounter: Payer: Self-pay | Admitting: Family Medicine

## 2018-06-16 ENCOUNTER — Ambulatory Visit (INDEPENDENT_AMBULATORY_CARE_PROVIDER_SITE_OTHER): Payer: Medicare Other | Admitting: Family Medicine

## 2018-06-16 ENCOUNTER — Other Ambulatory Visit: Payer: Self-pay

## 2018-06-16 VITALS — BP 117/52 | HR 58 | Temp 97.9°F | Ht 60.0 in | Wt 154.4 lb

## 2018-06-16 DIAGNOSIS — H04203 Unspecified epiphora, bilateral lacrimal glands: Secondary | ICD-10-CM | POA: Diagnosis not present

## 2018-06-16 DIAGNOSIS — E1121 Type 2 diabetes mellitus with diabetic nephropathy: Secondary | ICD-10-CM | POA: Diagnosis not present

## 2018-06-16 DIAGNOSIS — M12812 Other specific arthropathies, not elsewhere classified, left shoulder: Secondary | ICD-10-CM | POA: Diagnosis not present

## 2018-06-16 DIAGNOSIS — M25511 Pain in right shoulder: Secondary | ICD-10-CM | POA: Diagnosis not present

## 2018-06-16 DIAGNOSIS — M12811 Other specific arthropathies, not elsewhere classified, right shoulder: Secondary | ICD-10-CM | POA: Diagnosis not present

## 2018-06-16 DIAGNOSIS — H93A2 Pulsatile tinnitus, left ear: Secondary | ICD-10-CM

## 2018-06-16 DIAGNOSIS — H6122 Impacted cerumen, left ear: Secondary | ICD-10-CM

## 2018-06-16 DIAGNOSIS — G8929 Other chronic pain: Secondary | ICD-10-CM | POA: Diagnosis not present

## 2018-06-16 DIAGNOSIS — M25512 Pain in left shoulder: Secondary | ICD-10-CM

## 2018-06-16 LAB — POCT GLYCOSYLATED HEMOGLOBIN (HGB A1C): Hemoglobin A1C: 6.1 % — AB (ref 4.0–5.6)

## 2018-06-16 MED ORDER — LIDOCAINE HCL (PF) 1 % IJ SOLN: 4.0000 mL | Freq: Once | INTRAMUSCULAR | Status: DC

## 2018-06-16 MED ORDER — ONETOUCH ULTRA 2 W/DEVICE KIT: PACK | 0 refills | Status: DC

## 2018-06-16 MED ORDER — ONETOUCH ULTRA BLUE VI STRP: ORAL_STRIP | 3 refills | Status: DC

## 2018-06-16 MED ORDER — ONETOUCH ULTRASOFT LANCETS MISC: 3 refills | Status: AC

## 2018-06-16 MED ORDER — METHYLPREDNISOLONE ACETATE 40 MG/ML IJ SUSP: 40.0000 mg | Freq: Once | INTRAMUSCULAR | Status: DC

## 2018-06-16 NOTE — Patient Instructions (Addendum)
Thank you for coming to the office today.  You received a Left and Right Shoulder Joint steroid injection today. - Lidocaine numbing medicine may ease the pain initially for a few hours until it wears off - As discussed, you may experience a "steroid flare" this evening or within 24-48 hours, anytime medicine is injected into an inflamed joint it can cause the pain to get worse temporarily - Everyone responds differently to these injections, it depends on the patient and the severity of the joint problem, it may provide anywhere from days to weeks, to months of relief. Ideal response is >6 months relief - Try to take it easy for next 1-2 days, avoid over activity and strain on joint (limit lifting for shoulder) - Recommend the following:   - For swelling - rest, compression sleeve / ACE wrap, elevation, and ice packs as needed for first few days   - For pain in future may use heating pad or moist heat as needed  Recent Labs    01/11/18 1129 06/16/18 0821  HGBA1C 6.3* 6.1*   Keep up the good work with the sugar and lifestyle   Please schedule a Follow-up Appointment to: Return in about 3 months (around 09/16/2018) for DM A1c shoulder pain.  If you have any other questions or concerns, please feel free to call the office or send a message through [place]. You may also schedule an earlier appointment if necessary.  Additionally, you may be receiving a survey about your experience at our office within a few days to 1 week by e-mail or mail. We value your feedback.  [name], DO [place]

## 2018-06-16 NOTE — Assessment & Plan Note (Signed)
Well-controlled DM with A1c 6.1 today Remains off of medication Complications - CKD-III, other including hyperlipidemia - increases risk of future cardiovascular complications  OFF Metformin for years  Plan:  1. Remain off therapy - not indicated - now still diet controlled 2. Encourage improved lifestyle - low carb, low sugar diet, reduce portion size, continue improving regular exercise 3. May Check CBG  4. Continue ASA, ACEi, Statin 5. Referral to Ophthalmology 6. Follow-up 3-6 months A1c - patient request 3 months

## 2018-06-16 NOTE — Progress Notes (Signed)
Subjective:    Patient ID: Ellen [name], female    DOB: 02-13-1929, 85 y.o.   MRN: [id number]  Ellen [name] is a 85 y.o. female presenting on 06/16/2018 for Diabetes (needs a prescription for a glucometer ); Hypertension (blood pressure elevated in the morning. Pt states she hear swishing sounds in her ears in the morning. Shs describes it as a pulsation feeling.); and Shoulder Pain (bilateral shoulder pain)   HPI   Chronic Bilateral Shoulder Pain / Left Rotator Cuff Arthropathy - Last visit with me for this problem 03/2018 shoulder pain L, treated with steroid subacromial injection, see prior notes for background information. - Interval update with significant improvement on bilateral shoulder injection at that time, with about 3 months of good relief of pain and improved ROM - Today patient reports due for another injection in shoulder, she request one in each shoulder. She has acknowledged history of L shoulder rotator cuff damage and arthropathy, prior orthopedic years ago has advised that her "rotator cuff" is limited. Seems BOTH arms shoulders have similar issue - If worsening she has had difficulty moving her left shoulder up, she can lift it with her R arm. Admits throbbing pain. - Taking Tylenol Ext Str 519m x 2 per dose up to 3 times a day, some relief - Denies any bruising, redness, swelling, numbness, tingling, weakness  CHRONIC DM, Type 2: Last visit 01/2018. She used to be on Metformin >4 years ago but was taken off of this. Last A1c 6.3 (01/2018) due today CBGs: Not checking currently - needs new glucometer OneTouch requested Meds: None Currently on ACEi Lifestyle: - Diet (balanced diet, low carb diet)  - Exercise (limited, but does walking) Due for DM Eye Exam - no longer has eye doctor.needs referral Denies hypoglycemia, polyuria, visual changes, numbness or tingling.  [facility] last seen 05/2018, declined chemotherapy,  completed adjuvant XRT, on rx Letrozole for up to 5 years (starting 05/2018)- However she read into side effects on Letrozole and concerned about side effect of death. She is hopeful she remains cancer free.  Additional updates and concerns:  Chronic Headaches - She has since established with Neurology (Jefm Bryant on 5/4 - increased Gabapentin 2068mBID for headache  Abnormal Ear Pulsing Sound - Left Ear only - she uses Q-tip no actual problem, denies pain, noticed it recently at times. Not always. Denies dizziness.   [facility] 04/12/2018 03/13/2018 01/11/2018  Decreased Interest 0 0 0  Down, Depressed, Hopeless 0 0 0  PHQ - 2 Score 0 0 0    Social History   Tobacco Use  . Smoking status: Never Smoker  . Smokeless tobacco: Never Used  Substance Use Topics  . Alcohol use: Never    Frequency: Never  . Drug use: Never    Review of Systems Per HPI unless specifically indicated above     Objective:    BP (!) 117/52 (BP Location: Left Arm, Patient Position: Sitting, Cuff Size: Normal)   Pulse (!) 58   Temp 97.9 F (36.6 C) (Oral)   Ht 5' (1.524 m)   Wt 154 lb 6.4 oz (70 kg)   BMI 30.15 kg/m   Wt Readings from Last 3 Encounters:  06/16/18 154 lb 6.4 oz (70 kg)  06/07/18 (P) 153 lb 14.1 oz (69.8 kg)  04/12/18 156 lb 3.2 oz (70.9 kg)    Physical Exam Vitals signs and nursing note reviewed.  Constitutional:  General: She is not in acute distress.    Appearance: She is well-developed. She is not diaphoretic.     Comments: Well-appearing, comfortable, cooperative  HENT:     Head: Normocephalic and atraumatic.     Comments: Left Ear - dry cerumen appears impacted.  Right Ear - normal TM Eyes:     General:        Right eye: No discharge.        Left eye: No discharge.     Conjunctiva/sclera: Conjunctivae normal.  Neck:     Musculoskeletal: Normal range of motion and neck supple.     Thyroid: No thyromegaly.  Cardiovascular:     Rate and Rhythm:  Normal rate and regular rhythm.     Heart sounds: Normal heart sounds. No murmur.  Pulmonary:     Effort: Pulmonary effort is normal. No respiratory distress.     Breath sounds: Normal breath sounds. No wheezing or rales.  Musculoskeletal:     Comments: LEFT Shoulder Inspection: Normal appearance bilateral symmetrical Palpation: Non tender to palpation ROM: Significantly still limited AROM, unable to lift forward flexion or abduction due to pain. Passive ROM mostly intact but still limited pain at higher motion arc.  Special Testing: Rotator cuff testing unable to be completed due to pain   Strength: Distal Normal strength 5/5 grip, bicep flex/ext, supination/pronation wrist - cannot test shoulder rotator cuff  Neurovascular: Distally intact pulses, sensation to light touch   RIGHT Shoulder - SIMILAR to last visit. Inspection: Normal appearance bilateral symmetrical Palpation: Non tender to palpation ROM: Moderately limited AROM, can lift forward flex to 90* and abduction. Has limited internal rotation  Special Testing: Rotator cuff testing unable to be completed due to pain and range of motion  Strength: Distal Normal strength 5/5 grip, bicep flex/ext, supination/pronation wrist - cannot test shoulder rotator cuff  Neurovascular: Distally intact pulses, sensation to light touch   Lymphadenopathy:     Cervical: No cervical adenopathy.  Skin:    General: Skin is warm and dry.     Findings: No erythema or rash.  Neurological:     Mental Status: She is alert and oriented to person, place, and time.  Psychiatric:        Behavior: Behavior normal.     Comments: Well groomed, good eye contact, normal speech and thoughts      ________________________________________________________ PROCEDURE NOTE Date: 06/16/18 Left Subacromial Shoulder steroid injection Discussed benefits and risks (including pain, bleeding, infection, steroid flare). Verbal consent given by patient.  Medication:  1 cc Depo-medrol 57m and 4 cc Lidocaine 1% without epi Time Out taken  Landmarks identified. Area cleansed with alcohol wipes. Using 21 gauge and 1, 1/2 inch needle, Left subacromial bursa space was injected (with above listed medication) via posterior approach cold spray used for superficial anesthetic. Sterile bandage placed. Patient tolerated procedure well without bleeding or paresthesias. No complications.  Date: 06/16/18 RIGHT Subacromial Shoulder steroid injection Discussed benefits and risks (including pain, bleeding, infection, steroid flare). Verbal consent given by patient. Medication:  1 cc Depo-medrol 474mand 4 cc Lidocaine 1% without epi Time Out taken  Landmarks identified. Area cleansed with alcohol wipes. Using 21 gauge and 1, 1/2 inch needle, Right subacromial bursa space was injected (with above listed medication) via posterior approach cold spray used for superficial anesthetic. Sterile bandage placed. Patient tolerated procedure well without bleeding or paresthesias. No complications.    Recent Labs    01/11/18 1129 06/16/18 0821  HGBA1C 6.3* 6.1*  Results for orders placed or performed in visit on 06/16/18  POCT glycosylated hemoglobin (Hb A1C)  Result Value Ref Range   Hemoglobin A1C 6.1 (A) 4.0 - 5.6 %      Assessment & Plan:   Problem List Items Addressed This Visit    Chronic pain of both shoulders    Consistent with chronic bilateral shoulder chronic shoulder pain and bursitis with known LEFT rotator cuff tendinopathy with significant reduced active ROM but without significant evidence of muscle tear (no weakness).  - No recent imaging shoulders on chart - Prior injection subacromial last for >3 months - series #3 now  Plan: Right and Left shoulder subacromial steroid injection performed today, see procedure note for details.  May take Tylenol Ex Str 1-2 q 6 hr PRN Relative rest but keep shoulder mobile, demonstrated ROM exercises,  avoid heavy lifting May try heating pad PRN Follow-up 4-6 weeks if not improved for re-evaluation, consider referral to Physical Therapy vs Orthopedic if not improving  Follow in 3 months as scheduled, may repeat injection - use caution may recommend space to 6 months vs return to Orthopedic      Relevant Medications   lidocaine (PF) (XYLOCAINE) 1 % injection 4 mL   methylPREDNISolone acetate (DEPO-MEDROL) injection 40 mg   lidocaine (PF) (XYLOCAINE) 1 % injection 4 mL   methylPREDNISolone acetate (DEPO-MEDROL) injection 40 mg   Rotator cuff arthropathy of left shoulder   Relevant Medications   lidocaine (PF) (XYLOCAINE) 1 % injection 4 mL   methylPREDNISolone acetate (DEPO-MEDROL) injection 40 mg   Rotator cuff arthropathy of right shoulder   Relevant Medications   lidocaine (PF) (XYLOCAINE) 1 % injection 4 mL   methylPREDNISolone acetate (DEPO-MEDROL) injection 40 mg   Type 2 diabetes mellitus with diabetic nephropathy, without long-term current use of insulin (HCC) - Primary    Well-controlled DM with A1c 6.1 today Remains off of medication Complications - CKD-III, other including hyperlipidemia - increases risk of future cardiovascular complications  OFF Metformin for years  Plan:  1. Remain off therapy - not indicated - now still diet controlled 2. Encourage improved lifestyle - low carb, low sugar diet, reduce portion size, continue improving regular exercise 3. May Check CBG  4. Continue ASA, ACEi, Statin 5. Referral to Ophthalmology 6. Follow-up 3-6 months A1c - patient request 3 months      Relevant Medications   Blood Glucose Monitoring Suppl (ONE TOUCH ULTRA 2) w/Device KIT   ONE TOUCH ULTRA TEST test strip   Lancets (ONETOUCH ULTRASOFT) lancets   Other Relevant Orders   POCT glycosylated hemoglobin (Hb A1C) (Completed)   Ambulatory referral to Ophthalmology    Other Visit Diagnoses    Bilateral epiphora       Bilateral eye watering by report, in addition needs  DM eye exam, referral to Ophthalmology   Relevant Orders   Ambulatory referral to Ophthalmology   Pulsatile tinnitus of left ear       Left ear impacted cerumen        Recommend OTC Debrox ear drops for L ear impaction cerumen Monitor pulsatile tinnitus for now, may be vascular, but her HTN has been controlled lately    Orders Placed This Encounter  Procedures  . Ambulatory referral to Ophthalmology    Referral Priority:   Routine    Referral Type:   Consultation    Referral Reason:   Specialty Services Required    Requested Specialty:   Ophthalmology    Number of Visits  Requested:   1  . POCT glycosylated hemoglobin (Hb A1C)    Meds ordered this encounter  Medications  . Blood Glucose Monitoring Suppl (ONE TOUCH ULTRA 2) w/Device KIT    Sig: Use to check blood sugar daily, up to 2 times if need    Dispense:  1 each    Refill:  0  . ONE TOUCH ULTRA TEST test strip    Sig: Use to check blood sugar daily, up to 2 times if need    Dispense:  100 each    Refill:  3  . Lancets (ONETOUCH ULTRASOFT) lancets    Sig: Use to check blood sugar daily, up to 2 times if need    Dispense:  100 each    Refill:  3  . lidocaine (PF) (XYLOCAINE) 1 % injection 4 mL  . methylPREDNISolone acetate (DEPO-MEDROL) injection 40 mg  . lidocaine (PF) (XYLOCAINE) 1 % injection 4 mL  . methylPREDNISolone acetate (DEPO-MEDROL) injection 40 mg      Follow up plan: Return in about 3 months (around 09/16/2018) for DM A1c shoulder pain.   [name], [facility] 06/16/2018, 8:08 AM

## 2018-06-16 NOTE — Assessment & Plan Note (Signed)
Consistent with chronic bilateral shoulder chronic shoulder pain and bursitis with known LEFT rotator cuff tendinopathy with significant reduced active ROM but without significant evidence of muscle tear (no weakness).  - No recent imaging shoulders on chart - Prior injection subacromial last for >3 months - series #3 now  Plan: Right and Left shoulder subacromial steroid injection performed today, see procedure note for details.  May take Tylenol Ex Str 1-2 q 6 hr PRN Relative rest but keep shoulder mobile, demonstrated ROM exercises, avoid heavy lifting May try heating pad PRN Follow-up 4-6 weeks if not improved for re-evaluation, consider referral to Physical Therapy vs Orthopedic if not improving  Follow in 3 months as scheduled, may repeat injection - use caution may recommend space to 6 months vs return to Orthopedic

## 2018-06-19 ENCOUNTER — Other Ambulatory Visit: Payer: Medicare Other

## 2018-06-19 ENCOUNTER — Encounter: Payer: Medicare Other | Admitting: Genetic Counselor

## 2018-07-14 DIAGNOSIS — H2513 Age-related nuclear cataract, bilateral: Secondary | ICD-10-CM | POA: Diagnosis not present

## 2018-07-18 ENCOUNTER — Other Ambulatory Visit: Payer: Self-pay | Admitting: Family Medicine

## 2018-07-18 DIAGNOSIS — I10 Essential (primary) hypertension: Secondary | ICD-10-CM

## 2018-08-20 NOTE — Progress Notes (Signed)
[name]  Telephone:(336) [phone] Fax:(336) [phone]  ID: Ellen [name] OB: 1930-01-22  MR#: [id number]  AVW#:[id number]  Patient Care Team: Ellen Hauser, DO as PCP - General (Family Medicine)   CHIEF COMPLAINT: Stage Ia ER/PR positive, HER-2 negative invasive carcinoma of the left upper outer quadrant breast.   INTERVAL HISTORY: Patient returns to clinic today for routine 29-monthevaluation.  Despite medical recommendation, patient did not initiate treatment with letrozole.  She has some mild left breast tenderness, but otherwise feels well and is asymptomatic.  She continues to remain active spending as much time as she can in her garden. She has no neurologic complaints.  She denies any recent fevers or illnesses.  She has a good appetite and denies weight loss.  She denies any chest pain, shortness of breath, cough, or hemoptysis.  She denies any nausea, vomiting, constipation, or diarrhea.  She has no urinary complaints.  Patient feels at her baseline offers no specific complaints today.  REVIEW OF SYSTEMS:   Review of Systems  Constitutional: Negative.  Negative for fever, malaise/fatigue and weight loss.  Respiratory: Negative.  Negative for cough, hemoptysis and shortness of breath.   Cardiovascular: Negative.  Negative for chest pain and leg swelling.  Gastrointestinal: Negative.  Negative for abdominal pain, blood in stool and melena.  Genitourinary: Negative.  Negative for dysuria.  Musculoskeletal: Negative.  Negative for back pain.  Skin: Negative.  Negative for rash.  Neurological: Negative.  Negative for focal weakness, weakness and headaches.  Psychiatric/Behavioral: Negative.  The patient is not nervous/anxious.     As per HPI. Otherwise, a complete review of systems is negative.  PAST MEDICAL HISTORY: Past Medical History:  Diagnosis Date  . Arthritis   . [place]   [place] breast   . Cataract   . Chronic headaches    . CKD (chronic kidney disease)   . Diverticulitis   . History of syncope   . Hypertension   . Multinodular goiter   . Primary osteoarthritis of left knee   . [place]     PAST SURGICAL HISTORY: Past Surgical History:  Procedure Laterality Date  . ABDOMINAL HYSTERECTOMY    . APPENDECTOMY    . BREAST BIOPSY Left 01/03/2018   left breast stereo/x clip/path pending  . BREAST LUMPECTOMY Left 01/12/2018  . BREAST LUMPECTOMY WITH NEEDLE LOCALIZATION Left 01/12/2018   Procedure: BREAST LUMPECTOMY WITH NEEDLE LOCALIZATION;  Surgeon: PJules Husbands MD;  Location: [facility] ORS;  Service: General;  Laterality: Left;  . CHOLECYSTECTOMY    . COLON SURGERY    . COLOSTOMY  2008  . COLOSTOMY REVERSAL  2008  . EYE SURGERY    . REPLACEMENT TOTAL KNEE Right 2018  . SENTINEL NODE BIOPSY Left 03/07/2018   Procedure: SENTINEL NODE BIOPSY;  Surgeon: PJules Husbands MD;  Location: [facility] ORS;  Service: General;  Laterality: Left;    FAMILY HISTORY: Family History  Problem Relation Age of Onset  . Lung cancer Mother   . Diabetes Father   . Stroke Father   . Hypertension Maternal Grandmother   . Hypertension Maternal Uncle   . Hypertension Maternal Aunt   . Ovarian cancer Maternal Aunt   . Heart disease Daughter     ADVANCED DIRECTIVES (Y/N):  N  HEALTH MAINTENANCE: Social History   Tobacco Use  . Smoking status: Never Smoker  . Smokeless tobacco: Never Used  Substance Use Topics  . Alcohol use: Never    Frequency: Never  .  Drug use: Never     Colonoscopy:  PAP:  Bone density:  Lipid panel:  Allergies  Allergen Reactions  . Codeine Nausea Only  . Morphine And Related Nausea Only    Current Outpatient Medications  Medication Sig Dispense Refill  . acetaminophen (TYLENOL) 500 MG tablet Take 1,000 mg by mouth every 6 (six) hours as needed for moderate pain or headache.     Marland Kitchen amLODipine (NORVASC) 5 MG tablet Take 5 mg by mouth daily.     Marland Kitchen aspirin 81 MG chewable  tablet Chew 81 mg by mouth daily.     . Blood Glucose Monitoring Suppl (ONE TOUCH ULTRA 2) w/Device KIT Use to check blood sugar daily, up to 2 times if need 1 each 0  . cloNIDine (CATAPRES) 0.1 MG tablet TAKE 1 TABLET BY MOUTH DAILY AS NEEDED FOR ELEVATED BLOOD PRESSURE GREATER THAN 180/100/HEADACHE 30 tablet 2  . gabapentin (NEURONTIN) 100 MG capsule Start 1 capsule daily, increase by 1 cap every 1 week as tolerated, next add 1 in evening, up to max 3 pills in evening (Patient taking differently: Take 100 mg by mouth as directed. One in the morning and two tablets at bedtime) 90 capsule 1  . Lancets (ONETOUCH ULTRASOFT) lancets Use to check blood sugar daily, up to 2 times if need 100 each 3  . lisinopril-hydrochlorothiazide (ZESTORETIC) 20-12.5 MG tablet Take 1 tablet by mouth daily. 90 tablet 1  . ONE TOUCH ULTRA TEST test strip Use to check blood sugar daily, up to 2 times if need 100 each 3  . pravastatin (PRAVACHOL) 20 MG tablet Take 1 tablet (20 mg total) by mouth at bedtime. 90 tablet 3   Current Facility-Administered Medications  Medication Dose Route Frequency Provider Last Rate Last Dose  . lidocaine (PF) (XYLOCAINE) 1 % injection 4 mL  4 mL Other Once [name], Ellen J, DO      . lidocaine (PF) (XYLOCAINE) 1 % injection 4 mL  4 mL Other Once [name], Ellen J, DO      . methylPREDNISolone acetate (DEPO-MEDROL) injection 40 mg  40 mg Intra-articular Once [name], Ellen J, DO      . methylPREDNISolone acetate (DEPO-MEDROL) injection 40 mg  40 mg Intra-articular Once Ellen Hauser, DO        OBJECTIVE: Vitals:   08/25/18 1033  BP: (!) 163/65  Pulse: 62  Resp: 18  Temp: 98.3 F (36.8 C)     Body mass index is 29.57 kg/m.    ECOG FS:0 - Asymptomatic  General: Well-developed, well-nourished, no acute distress. Eyes: Pink conjunctiva, anicteric sclera. HEENT: Normocephalic, moist mucous membranes. Breast: Left breast and axilla without lumps or  masses. Lungs: Clear to auscultation bilaterally. Heart: Regular rate and rhythm. No rubs, murmurs, or gallops. Abdomen: Soft, nontender, nondistended. No organomegaly noted, normoactive bowel sounds. Musculoskeletal: No edema, cyanosis, or clubbing. Neuro: Alert, answering all questions appropriately. Cranial nerves grossly intact. Skin: No rashes or petechiae noted. Psych: Normal affect.  LAB RESULTS:  Lab Results  Component Value Date   NA 139 04/10/2018   K 3.5 04/10/2018   CL 105 04/10/2018   CO2 26 04/10/2018   GLUCOSE 84 04/10/2018   BUN 18 04/10/2018   CREATININE 1.08 (H) 04/10/2018   CALCIUM 8.5 (L) 04/10/2018   PROT 7.1 04/09/2018   ALBUMIN 3.1 (L) 04/09/2018   AST 24 04/09/2018   ALT 9 04/09/2018   ALKPHOS 42 04/09/2018   BILITOT 0.9 04/09/2018   GFRNONAA 46 (L) 04/10/2018  GFRAA 54 (L) 04/10/2018    Lab Results  Component Value Date   WBC 5.5 05/08/2018   NEUTROABS 2.4 08/06/2017   HGB 11.4 (L) 05/08/2018   HCT 35.6 (L) 05/08/2018   MCV 84.8 05/08/2018   PLT 256 05/08/2018     STUDIES: No results found.  ASSESSMENT: Stage Ia ER/PR positive, HER-2 negative invasive carcinoma of the left upper outer quadrant breast.    PLAN:  1. Stage Ia ER/PR positive, HER-2 negative invasive carcinoma of the left upper outer quadrant breast: Given patient's advanced age, Oncotype DX was not ordered since patient declined chemotherapy.  Patient completed adjuvant XRT in approximately April 2020.  She was given a prescription for letrozole at that time, but did not initiate treatment.  Patient agreed to try letrozole for 3 months and then decide whether she will continue treatment.  Return to clinic in 3 months to assess her toleration of letrozole and further evaluation. 2.  Bone health: Will get a baseline bone mineral density in the next 1 to 2 weeks.   I spent a total of 20 minutes face-to-face with the patient of which greater than 50% of the visit was spent in  counseling and coordination of care as detailed above.   Patient expressed understanding and was in agreement with this plan. She also understands that She can call clinic at any time with any questions, concerns, or complaints.   Cancer Staging Primary cancer of upper outer quadrant of left female breast [facility]) Staging form: Breast, AJCC 8th Edition - Clinical stage from 01/11/2018: Stage IA (cT1b, cN0, cM0, G2, ER+, PR+, HER2-) - Signed by Lloyd Huger, MD on 02/10/2018   Lloyd Huger, MD   08/27/2018 8:12 AM

## 2018-08-24 ENCOUNTER — Other Ambulatory Visit: Payer: Self-pay

## 2018-08-25 ENCOUNTER — Other Ambulatory Visit: Payer: Self-pay

## 2018-08-25 ENCOUNTER — Inpatient Hospital Stay: Payer: Medicare Other | Attending: Oncology | Admitting: Oncology

## 2018-08-25 ENCOUNTER — Encounter: Payer: Self-pay | Admitting: Oncology

## 2018-08-25 VITALS — BP 163/65 | HR 62 | Temp 98.3°F | Resp 18 | Wt 151.4 lb

## 2018-08-25 DIAGNOSIS — N189 Chronic kidney disease, unspecified: Secondary | ICD-10-CM | POA: Diagnosis not present

## 2018-08-25 DIAGNOSIS — Z801 Family history of malignant neoplasm of trachea, bronchus and lung: Secondary | ICD-10-CM | POA: Insufficient documentation

## 2018-08-25 DIAGNOSIS — Z7982 Long term (current) use of aspirin: Secondary | ICD-10-CM | POA: Diagnosis not present

## 2018-08-25 DIAGNOSIS — Z90710 Acquired absence of both cervix and uterus: Secondary | ICD-10-CM | POA: Diagnosis not present

## 2018-08-25 DIAGNOSIS — Z79811 Long term (current) use of aromatase inhibitors: Secondary | ICD-10-CM

## 2018-08-25 DIAGNOSIS — M8580 Other specified disorders of bone density and structure, unspecified site: Secondary | ICD-10-CM

## 2018-08-25 DIAGNOSIS — Z79899 Other long term (current) drug therapy: Secondary | ICD-10-CM | POA: Diagnosis not present

## 2018-08-25 DIAGNOSIS — Z170 Estrogen receptor positive status [ER+]: Secondary | ICD-10-CM | POA: Diagnosis not present

## 2018-08-25 DIAGNOSIS — Z8041 Family history of malignant neoplasm of ovary: Secondary | ICD-10-CM | POA: Diagnosis not present

## 2018-08-25 DIAGNOSIS — I10 Essential (primary) hypertension: Secondary | ICD-10-CM | POA: Diagnosis not present

## 2018-08-25 DIAGNOSIS — C50412 Malignant neoplasm of upper-outer quadrant of left female breast: Secondary | ICD-10-CM | POA: Diagnosis not present

## 2018-08-25 NOTE — Progress Notes (Signed)
Pt complains of occasional left breast tenderness. No complaints today. Feeling well.

## 2018-09-08 ENCOUNTER — Ambulatory Visit
Admission: RE | Admit: 2018-09-08 | Discharge: 2018-09-08 | Disposition: A | Discharge status: Home / Self Care | Payer: Medicare Other | Source: Ambulatory Visit | Attending: Oncology | Admitting: Oncology

## 2018-09-08 ENCOUNTER — Other Ambulatory Visit: Payer: Self-pay

## 2018-09-08 DIAGNOSIS — M8589 Other specified disorders of bone density and structure, multiple sites: Secondary | ICD-10-CM | POA: Diagnosis not present

## 2018-09-08 DIAGNOSIS — Z13820 Encounter for screening for osteoporosis: Secondary | ICD-10-CM | POA: Insufficient documentation

## 2018-09-08 DIAGNOSIS — M8580 Other specified disorders of bone density and structure, unspecified site: Secondary | ICD-10-CM | POA: Insufficient documentation

## 2018-09-08 DIAGNOSIS — Z780 Asymptomatic menopausal state: Secondary | ICD-10-CM | POA: Diagnosis not present

## 2018-09-08 DIAGNOSIS — M069 Rheumatoid arthritis, unspecified: Secondary | ICD-10-CM | POA: Insufficient documentation

## 2018-09-18 DIAGNOSIS — R51 Headache: Secondary | ICD-10-CM | POA: Diagnosis not present

## 2018-09-18 DIAGNOSIS — R300 Dysuria: Secondary | ICD-10-CM | POA: Diagnosis not present

## 2018-09-21 ENCOUNTER — Ambulatory Visit (INDEPENDENT_AMBULATORY_CARE_PROVIDER_SITE_OTHER): Payer: Medicare Other | Admitting: Family Medicine

## 2018-09-21 ENCOUNTER — Other Ambulatory Visit: Payer: Self-pay

## 2018-09-21 ENCOUNTER — Encounter: Payer: Self-pay | Admitting: Family Medicine

## 2018-09-21 VITALS — BP 128/50 | HR 62 | Temp 98.5°F | Resp 16 | Ht 60.0 in | Wt 151.6 lb

## 2018-09-21 DIAGNOSIS — B9620 Unspecified Escherichia coli [E. coli] as the cause of diseases classified elsewhere: Secondary | ICD-10-CM

## 2018-09-21 DIAGNOSIS — R630 Anorexia: Secondary | ICD-10-CM

## 2018-09-21 DIAGNOSIS — N390 Urinary tract infection, site not specified: Secondary | ICD-10-CM

## 2018-09-21 MED ORDER — CEPHALEXIN 500 MG PO CAPS: 500.0000 mg | ORAL_CAPSULE | Freq: Three times a day (TID) | ORAL | 0 refills | Status: DC

## 2018-09-21 NOTE — Progress Notes (Signed)
Subjective:    Patient ID: [name], female    DOB: 11-23-1929, 85 y.o.   MRN: [id number]  [place] Sinkfield is a 85 y.o. female presenting on 09/21/2018 for Weight Loss and Urinary Tract Infection   HPI   E Coli UTI Describes some lower pelvic abdominal discomfort, has had some difficulty with urination with reduced output has urgency to void but less urine. Report faxed from [facility] collected UA and culture from Friday last week showed E Coli UTI pan-sensitive. She was not treated and asked to come here for UTI. Denies dysuria, hematuria, nausea vomiting, fever chills  Reduced Appetite vs Possible Weight Loss S/p L Breaster Cancer Followed by [facility] Oncology Dr Grayland Ormond, last seen 08/25/2018, declined chemotherapy, completed adjuvant XRT - Now she has been on Letrozole since 08/25/18 for 3 month trial, to determine longer term treatment plan. - There was no concern of weight loss by Oncology at that last visit - Today she endorses her own concern of some reduced weight and reduced appetite. She says appetite comes and goes, but now it is better. She is eating regular meals but forgets sometimes or doesn't feel hungry. She has weight trend of up to 176 lbs in 08/2017 then down to 159 approx in 11/2017, avg from 152 to 159 in past 10 months. - her weight is mostly stable in past 3 months - Denies abdominal pain, nausea vomiting, or other concerns causing her weight loss   Depression screen [facility] 2/9 09/21/2018 04/12/2018 03/13/2018  Decreased Interest 0 0 0  Down, Depressed, Hopeless 0 0 0  PHQ - 2 Score 0 0 0    Social History   Tobacco Use  . Smoking status: Never Smoker  . Smokeless tobacco: Never Used  Substance Use Topics  . Alcohol use: Never    Frequency: Never  . Drug use: Never    Review of Systems Per HPI unless specifically indicated above     Objective:    BP (!) 128/50   Pulse 62   Temp 98.5 F (36.9 C) (Oral)   Resp 16   Ht 5' (1.524 m)   Wt 151  lb 9.6 oz (68.8 kg)   BMI 29.61 kg/m   Wt Readings from Last 3 Encounters:  09/21/18 151 lb 9.6 oz (68.8 kg)  08/25/18 151 lb 6.4 oz (68.7 kg)  06/16/18 154 lb 6.4 oz (70 kg)    Physical Exam Vitals signs and nursing note reviewed.  Constitutional:      General: She is not in acute distress.    Appearance: She is well-developed. She is not diaphoretic.     Comments: Well-appearing, comfortable, cooperative  HENT:     Head: Normocephalic and atraumatic.  Eyes:     General:        Right eye: No discharge.        Left eye: No discharge.     Conjunctiva/sclera: Conjunctivae normal.  Cardiovascular:     Rate and Rhythm: Normal rate.  Pulmonary:     Effort: Pulmonary effort is normal.  Skin:    General: Skin is warm and dry.     Findings: No erythema or rash.  Neurological:     Mental Status: She is alert and oriented to person, place, and time.  Psychiatric:        Behavior: Behavior normal.     Comments: Well groomed, good eye contact, normal speech and thoughts    Results for orders placed or performed in visit on  06/16/18  POCT glycosylated hemoglobin (Hb A1C)  Result Value Ref Range   Hemoglobin A1C 6.1 (A) 4.0 - 5.6 %      Assessment & Plan:   Problem List Items Addressed This Visit    None    Visit Diagnoses    E. coli UTI    -  Primary Confirmed based on recent urine culture E Coli UTI pansensitive from [facility] to be scanned into chart  Treat UTI with Keflex 500 TID x 7 days Follow-up if not resolve    Relevant Medications   cephALEXin (KEFLEX) 500 MG capsule   Decrease in appetite      Clinically improved, seems intermittent, difficulty in explaining this Possibly related to history of breast cancer over past 1 year, but has been relatively stable in past 10 months, also last 3 months stable - Improved appetite now - No other red flags causing weight loss  Recommend continue regular meals, and add protein supplement (she doesn't do dairy) so  protein bar once a day as needed if tolerated F/u 3 months    Meds ordered this encounter  Medications  . cephALEXin (KEFLEX) 500 MG capsule    Sig: Take 1 capsule (500 mg total) by mouth 3 (three) times daily. For 7 days    Dispense:  21 capsule    Refill:  0      Follow up plan: Return in about 3 months (around 12/22/2018) for Follow-up 3 months DM A1c.    [name], [facility] 09/21/2018, 9:43 AM

## 2018-09-21 NOTE — Patient Instructions (Addendum)
Thank you for coming to the office today.  1. You have a Urinary Tract Infection - this is very common, your symptoms are reassuring and you should get better within 1 week on the antibiotics - Start Keflex 500mg  3 times daily for next 7 days, complete entire course, even if feeling better - Please drink plenty of fluids, improve hydration over next 1 week  If symptoms worsening, developing nausea / vomiting, worsening back pain, fevers / chills / sweats, then please return for re-evaluation sooner.   Weight has been stable 152 to 159 lbs since 11/2018  Try protein bar supplement snack between a meal once a day or as needed for energy boost.   Please schedule a Follow-up Appointment to: Return in about 3 months (around 12/22/2018) for Follow-up 3 months DM A1c.  If you have any other questions or concerns, please feel free to call the office or send a message through [place]. You may also schedule an earlier appointment if necessary.  Additionally, you may be receiving a survey about your experience at our office within a few days to 1 week by e-mail or mail. We value your feedback.  [name], DO [place]

## 2018-09-22 ENCOUNTER — Ambulatory Visit: Payer: Medicare Other | Admitting: Family Medicine

## 2018-09-25 ENCOUNTER — Telehealth: Payer: Self-pay | Admitting: Licensed Clinical Social Worker

## 2018-09-26 ENCOUNTER — Other Ambulatory Visit: Payer: Medicare Other

## 2018-09-27 ENCOUNTER — Other Ambulatory Visit: Payer: Self-pay

## 2018-09-27 NOTE — Telephone Encounter (Signed)
Attempted to call Ms. Nuzzo to confirm genetics appt, no answer and no VM

## 2018-09-27 NOTE — Patient Outreach (Signed)
[facility]) Care Management  09/27/2018  Ellen [name] 1929/03/11 [id number]   Medication Adherence call to [name] [employer] spoke with patient she is past due on Lisinopril/Hctz 20/12.5 mg patient explain she pick up this medication from the pharmacy and has plenty for 90 days,patient explain she takes it once daily along Amlodipine. [name] is showing past due under [place].   [place] Management Direct Dial [phone]  Fax [phone] [email or web address]

## 2018-09-28 ENCOUNTER — Inpatient Hospital Stay: Payer: Medicare Other | Attending: Oncology | Admitting: Licensed Clinical Social Worker

## 2018-09-28 ENCOUNTER — Encounter: Payer: Self-pay | Admitting: Licensed Clinical Social Worker

## 2018-09-28 ENCOUNTER — Inpatient Hospital Stay: Payer: Medicare Other

## 2018-09-28 ENCOUNTER — Other Ambulatory Visit: Payer: Self-pay

## 2018-09-28 DIAGNOSIS — C50412 Malignant neoplasm of upper-outer quadrant of left female breast: Secondary | ICD-10-CM | POA: Diagnosis not present

## 2018-09-28 DIAGNOSIS — Z170 Estrogen receptor positive status [ER+]: Secondary | ICD-10-CM | POA: Diagnosis not present

## 2018-09-28 DIAGNOSIS — Z801 Family history of malignant neoplasm of trachea, bronchus and lung: Secondary | ICD-10-CM | POA: Diagnosis not present

## 2018-09-28 DIAGNOSIS — Z8041 Family history of malignant neoplasm of ovary: Secondary | ICD-10-CM | POA: Insufficient documentation

## 2018-09-28 NOTE — Progress Notes (Signed)
REFERRING PROVIDER: Lloyd Huger, MD [address],  [place] [address]  PRIMARY PROVIDER:  [name], DO  PRIMARY REASON FOR VISIT:  1. Family history of ovarian cancer      HISTORY OF PRESENT ILLNESS:   Ms. Boyum, a 85 y.o. female, was seen for a [place] cancer genetics consultation at the request of Dr. Grayland Ormond due to a personal and family history of cancer.  Ms. Sosnowski presents to clinic today to discuss the possibility of a hereditary predisposition to cancer, genetic testing, and to further clarify her future cancer risks, as well as potential cancer risks for family members.   In 2019, at the age of 48, Ms. Klahr was diagnosed with IDC and DCIS of the left breast, ER/PR+, Her2-. This was treated with lumpectomy and radiation.   CANCER HISTORY:  Oncology History  Primary cancer of upper outer quadrant of left female breast ([place])  01/08/2018 Initial Diagnosis   Ductal carcinoma in situ (DCIS) of left breast   01/11/2018 Cancer Staging   Staging form: Breast, AJCC 8th Edition - Clinical stage from 01/11/2018: Stage IA (cT1b, cN0, cM0, G2, ER+, PR+, HER2-) - Signed by Lloyd Huger, MD on 02/10/2018     Past Medical History:  Diagnosis Date  . Arthritis   . Cancer ([place]) 01/2018   left breast   . Cataract   . Chronic headaches   . CKD (chronic kidney disease)   . Diverticulitis   . Family history of ovarian cancer   . History of syncope   . Hypertension   . Multinodular goiter   . Primary osteoarthritis of left knee   . Pulmonary hypertension ([place])     Past Surgical History:  Procedure Laterality Date  . ABDOMINAL HYSTERECTOMY    . APPENDECTOMY    . BREAST BIOPSY Left 01/03/2018   left breast stereo/x clip/path pending  . BREAST LUMPECTOMY Left 01/12/2018  . BREAST LUMPECTOMY WITH NEEDLE LOCALIZATION Left 01/12/2018   Procedure: BREAST LUMPECTOMY WITH NEEDLE LOCALIZATION;  Surgeon: Jules Husbands, MD;   Location: [facility] ORS;  Service: General;  Laterality: Left;  . CHOLECYSTECTOMY    . COLON SURGERY    . COLOSTOMY  2008  . COLOSTOMY REVERSAL  2008  . EYE SURGERY    . REPLACEMENT TOTAL KNEE Right 2018  . SENTINEL NODE BIOPSY Left 03/07/2018   Procedure: SENTINEL NODE BIOPSY;  Surgeon: Jules Husbands, MD;  Location: [facility] ORS;  Service: General;  Laterality: Left;    Social History   Socioeconomic History  . Marital status: Widowed    Spouse name: Not on file  . Number of children: Not on file  . Years of education: Not on file  . Highest education level: Not on file  Occupational History  . Not on file  Social Needs  . Financial resource strain: Not on file  . Food insecurity    Worry: Not on file    Inability: Not on file  . Transportation needs    Medical: Not on file    Non-medical: Not on file  Tobacco Use  . Smoking status: Never Smoker  . Smokeless tobacco: Never Used  Substance and Sexual Activity  . Alcohol use: Never    Frequency: Never  . Drug use: Never  . Sexual activity: Not on file  Lifestyle  . Physical activity    Days per week: Not on file    Minutes per session: Not on file  . Stress: Not  on file  Relationships  . Social Herbalist on phone: Not on file    Gets together: Not on file    Attends religious service: Not on file    Active member of club or organization: Not on file    Attends meetings of clubs or organizations: Not on file    Relationship status: Not on file  Other Topics Concern  . Not on file  Social History Narrative  . Not on file     FAMILY HISTORY:  We obtained a detailed, 4-generation family history.  Significant diagnoses are listed below: Family History  Problem Relation Age of Onset  . Lung cancer Mother 17  . Diabetes Father   . Stroke Father   . Hypertension Maternal Grandmother   . Hypertension Maternal Uncle   . Hypertension Maternal Aunt   . Ovarian cancer Maternal Aunt 58  . Heart disease Daughter     Ms. Rallo has 2 daughters, ages 9 and 43, and one son, age 21, no history of cancer. She has one sister, 38, who has not had cancer.  Ms. Cordell mother had lung cancer at 57 and died at 32. Patient had 4 maternal aunts, 4 maternal uncles. One of her aunts had ovarian cancer at 51 and died at 32. No known cancers in maternal cousins. Her maternal grandmother died at 9, grandfather died at 38.  Ms. Sampley father died at 38. Patient had 7 paternal aunts, 7 paternal uncles, no cancers. No cancer she is aware of in her paternal cousins. Her paternal grandmother died at 47 but did not have cancer, she does not have information about paternal grandfather.  Ms. Fahs is unaware of previous family history of genetic testing for hereditary cancer risks. There is no reported Ashkenazi Jewish ancestry. There is no known consanguinity.  GENETIC COUNSELING ASSESSMENT: Ms. Murchison is a 85 y.o. female with a personal and family history which is somewhat suggestive of a hereditary cancer syndrome and predisposition to cancer. We, therefore, discussed and recommended the following at today's visit.   DISCUSSION: We discussed that 5 - 10% of breast cancer is hereditary, with most cases associated with BRCA1/BRCA2 mutations. Ovarian cancer can also be seen in this syndrome. There are other genes that can be associated with hereditary breast cancer syndromes and hereditary ovarian cancer syndromes. We discussed that testing is beneficial for several reasons including, knowing how to follow individuals for cancer screenings, and understand if other family members could be at risk for cancer and allow them to undergo genetic testing.   We reviewed the characteristics, features and inheritance patterns of hereditary cancer syndromes. We also discussed genetic testing, including the appropriate family members to test, the process of testing, insurance coverage and turn-around-time for results. We  discussed the implications of a negative, positive and/or variant of uncertain significant result. We recommended Ms. Crissman pursue genetic testing for the Common Hereditary Cancers gene panel.   The Common Hereditary Cancers Panel offered by [facility] includes sequencing and/or deletion duplication testing of the following 48 genes: APC, ATM, AXIN2, BARD1, BMPR1A, BRCA1, BRCA2, BRIP1, CDH1, CDKN2A (p14ARF), CDKN2A (p16INK4a), CKD4, CHEK2, CTNNA1, DICER1, EPCAM (Deletion/duplication testing only), GREM1 (promoter region deletion/duplication testing only), KIT, MEN1, MLH1, MSH2, MSH3, MSH6, MUTYH, NBN, NF1, NHTL1, PALB2, PDGFRA, PMS2, POLD1, POLE, PTEN, RAD50, RAD51C, RAD51D, RNF43, SDHB, SDHC, SDHD, SMAD4, SMARCA4. STK11, TP53, TSC1, TSC2, and VHL.  The following genes were evaluated for sequence changes only: SDHA and HOXB13 c.251G>A variant only.  Based on Ms. Balbuena's personal and family history of cancer, she meets medical criteria for genetic testing. Despite that she meets criteria, she may still have an out of pocket cost.  PLAN: After considering the risks, benefits, and limitations, Ms. Morash provided informed consent to pursue genetic testing and the blood sample was sent to [facility] for analysis of the Common Hereditary Cancers Panel. Results should be available within approximately 2-3 weeks' time, at which point they will be disclosed by telephone to Ms. Besse, as will any additional recommendations warranted by these results. Ms. Gaertner will receive a summary of her genetic counseling visit and a copy of her results once available. This information will also be available in Epic.   Ms. Edgington questions were answered to her satisfaction today. Our contact information was provided should additional questions or concerns arise. Thank you for the referral and allowing [place] to share in the care of your patient.   Faith Rogue, MS, [facility] Genetic  Counselor [email or web address] Phone: [phone]  The patient was seen for a total of 25 minutes in face-to-face genetic counseling.  Dr. Grayland Ormond was available for discussion regarding this case.   _______________________________________________________________________ For Office Staff:  Number of people involved in session: 1 Was an Intern/ student involved with case: no

## 2018-10-06 ENCOUNTER — Ambulatory Visit: Payer: Self-pay | Admitting: Licensed Clinical Social Worker

## 2018-10-06 ENCOUNTER — Encounter: Payer: Self-pay | Admitting: Licensed Clinical Social Worker

## 2018-10-06 ENCOUNTER — Telehealth: Payer: Self-pay | Admitting: Licensed Clinical Social Worker

## 2018-10-06 DIAGNOSIS — C50412 Malignant neoplasm of upper-outer quadrant of left female breast: Secondary | ICD-10-CM

## 2018-10-06 DIAGNOSIS — Z1379 Encounter for other screening for genetic and chromosomal anomalies: Secondary | ICD-10-CM | POA: Insufficient documentation

## 2018-10-06 DIAGNOSIS — Z8041 Family history of malignant neoplasm of ovary: Secondary | ICD-10-CM

## 2018-10-06 NOTE — Progress Notes (Signed)
HPI:  [name] was previously seen in the [facility] due to a personal and family history of cancer and concerns regarding a hereditary predisposition to cancer. Please refer to our prior cancer genetics clinic note for more information regarding our discussion, assessment and recommendations, at the time. [name] recent genetic test results were disclosed to her, as were recommendations warranted by these results. These results and recommendations are discussed in more detail below.  CANCER HISTORY:  Oncology History  Primary cancer of upper outer quadrant of left female breast ([place])  01/08/2018 Initial Diagnosis   Ductal carcinoma in situ (DCIS) of left breast   01/11/2018 Cancer Staging   Staging form: Breast, AJCC 8th Edition - Clinical stage from 01/11/2018: Stage IA (cT1b, cN0, cM0, G2, ER+, PR+, HER2-) - Signed by Ellen Huger, MD on 02/10/2018     FAMILY HISTORY:  We obtained a detailed, 4-generation family history.  Significant diagnoses are listed below: Family History  Problem Relation Age of Onset   Lung cancer Mother 44   Diabetes Father    Stroke Father    Hypertension Maternal Grandmother    Hypertension Maternal Uncle    Hypertension Maternal Aunt    Ovarian cancer Maternal Aunt 48   Heart disease Daughter    [name] has 2 daughters, ages 79 and 90, and one son, age 10, no history of cancer. She has one sister, 64, who has not had cancer.  [name] mother had lung cancer at 71 and died at 34. Patient had 4 maternal aunts, 4 maternal uncles. One of her aunts had ovarian cancer at 16 and died at 19. No known cancers in maternal cousins. Her maternal grandmother died at 22, grandfather died at 78.  [name] father died at 45. Patient had 7 paternal aunts, 7 paternal uncles, no cancers. No cancer she is aware of in her paternal cousins. Her paternal grandmother died at 46 but did not have cancer, she  does not have information about paternal grandfather.  [name] is unaware of previous family history of genetic testing for hereditary cancer risks. There is no reported Ashkenazi Jewish ancestry. There is no known consanguinity.   GENETIC TEST RESULTS: Genetic testing reported out on 10/04/2018 through the [facility] Common Hereditary  cancer panel found no pathogenic mutations. The Common Hereditary Cancers Panel offered by [facility] includes sequencing and/or deletion duplication testing of the following 48 genes: APC, ATM, AXIN2, BARD1, BMPR1A, BRCA1, BRCA2, BRIP1, CDH1, CDKN2A (p14ARF), CDKN2A (p16INK4a), CKD4, CHEK2, CTNNA1, DICER1, EPCAM (Deletion/duplication testing only), GREM1 (promoter region deletion/duplication testing only), KIT, MEN1, MLH1, MSH2, MSH3, MSH6, MUTYH, NBN, NF1, NHTL1, PALB2, PDGFRA, PMS2, POLD1, POLE, PTEN, RAD50, RAD51C, RAD51D, RNF43, SDHB, SDHC, SDHD, SMAD4, SMARCA4. STK11, TP53, TSC1, TSC2, and VHL.  The following genes were evaluated for sequence changes only: SDHA and HOXB13 c.251G>A variant only. The test report has been scanned into EPIC and is located under the Molecular Pathology section of the Results Review tab.  A portion of the result report is included below for reference.     We discussed with Ms. Banes that because current genetic testing is not perfect, it is possible there may be a gene mutation in one of these genes that current testing cannot detect, but that chance is small.  We also discussed, that there could be another gene that has not yet been discovered, or that we have not yet tested, that is responsible for the cancer diagnoses in the family.  It is also possible there is a hereditary cause for the cancer in the family that [name] did not inherit and therefore was not identified in her testing.  Therefore, it is important to remain in touch with cancer genetics in the future so that we can continue to offer [name] the most up to  date genetic testing.   Genetic testing did identify a variant of uncertain significance (VUS) in the POLE gene called c.1550C>G.  At this time, it is unknown if this variant is associated with increased cancer risk or if this is a normal finding, but most variants such as this get reclassified to being inconsequential. It should not be used to make medical management decisions. With time, we suspect the lab will determine the significance of this variant, if any. If we do learn more about it, we will try to contact [name] to discuss it further. However, it is important to stay in touch with [place] periodically and keep the address and phone number up to date.  ADDITIONAL GENETIC TESTING: We discussed with [name] that her genetic testing was fairly extensive.  If there are genes identified to increase cancer risk that can be analyzed in the future, we would be happy to discuss and coordinate this testing at that time.    CANCER SCREENING RECOMMENDATIONS: [name] test result is considered negative (normal).  This means that we have not identified a hereditary cause for her personal and family history of cancer at this time. Most cancers happen by chance and this negative test suggests that her cancer may fall into this category.    While reassuring, this does not definitively rule out a hereditary predisposition to cancer. It is still possible that there could be genetic mutations that are undetectable by current technology. There could be genetic mutations in genes that have not been tested or identified to increase cancer risk.  Therefore, it is recommended she continue to follow the cancer management and screening guidelines provided by her oncology and primary healthcare provider.   An individual's cancer risk and medical management are not determined by genetic test results alone. Overall cancer risk assessment incorporates additional factors, including personal medical history,  family history, and any available genetic information that may result in a personalized plan for cancer prevention and surveillance  RECOMMENDATIONS FOR FAMILY MEMBERS:  Relatives in this family might be at some increased risk of developing cancer, over the general population risk, simply due to the family history of cancer.  We recommended female relatives in this family have a yearly mammogram beginning at age 9, or 65 years younger than the earliest onset of cancer, an annual clinical breast exam, and perform monthly breast self-exams. Female relatives in this family should also have a gynecological exam as recommended by their primary provider. All family members should have a colonoscopy by age 67, or as directed by their physicians.   It is also possible there is a hereditary cause for the cancer in [name]'s family that she did not inherit and therefore was not identified in her.  Based on [name]'s family history, we recommended her maternal relatives have genetic counseling and testing. [name] will let us know if we can be of any assistance in coordinating genetic counseling and/or testing for these family members.  FOLLOW-UP: Lastly, we discussed with [name] that cancer genetics is a rapidly advancing field and it is possible that new genetic tests will be appropriate for her and/or her  family members in the future. We encouraged her to remain in contact with cancer genetics on an annual basis so we can update her personal and family histories and let her know of advances in cancer genetics that may benefit this family.   Our contact number was provided. [name] questions were answered to her satisfaction, and she knows she is welcome to call us at anytime with additional questions or concerns.   Faith Rogue, MS, [facility] Genetic Counselor [email or web address] Phone: [phone]

## 2018-10-06 NOTE — Telephone Encounter (Signed)
Revealed negative genetic testing.  Revealed that a VUS in POLE was identified. This normal result is reassuring and indicates that it is unlikely Ellen [name]'s cancer is due to a hereditary cause.  It is unlikely that there is an increased risk of another cancer due to a mutation in one of these genes.  However, genetic testing is not perfect, and cannot definitively rule out a hereditary cause.  It will be important for her to keep in contact with genetics to learn if any additional testing may be needed in the future.

## 2018-10-18 ENCOUNTER — Other Ambulatory Visit: Payer: Self-pay | Admitting: Family Medicine

## 2018-10-18 ENCOUNTER — Other Ambulatory Visit: Payer: Self-pay | Admitting: Nurse Practitioner

## 2018-10-18 DIAGNOSIS — I10 Essential (primary) hypertension: Secondary | ICD-10-CM

## 2018-10-24 ENCOUNTER — Other Ambulatory Visit: Payer: Self-pay

## 2018-10-24 NOTE — Patient Outreach (Signed)
[facility]) Care Management  10/24/2018  Ellen [name] Jan 21, 1930 [id number]   Medication Adherence call to [place] spoke with patient she is past due on Pravastatin 20 mg and Lisinopril/Hctz 20/12.5 mg patient explain she has already pick up all her medications and has enough for 90 days,patient explain she is having a hard time ordering a medical alarm she want to get one because she is having headaches every other day and is afraid something might happen to her she explain her daughter is looking into it buying one for her, [name] is showing past due under [place].   [place] Management Direct Dial [phone]  Fax [phone] [email or web address]

## 2018-11-08 ENCOUNTER — Ambulatory Visit: Payer: Medicare Other | Attending: Radiation Oncology | Admitting: Radiation Oncology

## 2018-11-17 ENCOUNTER — Other Ambulatory Visit: Payer: Self-pay | Admitting: Nurse Practitioner

## 2018-11-17 DIAGNOSIS — I10 Essential (primary) hypertension: Secondary | ICD-10-CM

## 2018-11-23 NOTE — Progress Notes (Signed)
[place]  Telephone:([phone]) [phone] [phone]) [phone]  ID: Ellen [name] OB: 11-14-1929  MR#: [id number]  AOZ#:[id number]  Patient Care Team: [name], DO as PCP - General (Family Medicine)   CHIEF COMPLAINT: Stage Ia ER/PR positive, HER-2 negative invasive carcinoma of the left upper outer quadrant breast.   INTERVAL HISTORY: Patient returns to clinic today for routine 8-monthevaluation.  She continues to refuse treatment with letrozole despite acknowledging the increased risk of recurrence.  She is having significant left breast pain, but otherwise feels well.  She continues to remain active spending as much time as she can in her garden. She has no neurologic complaints.  She denies any recent fevers or illnesses.  She has a good appetite and denies weight loss.  She denies any chest pain, shortness of breath, cough, or hemoptysis.  She denies any nausea, vomiting, constipation, or diarrhea.  She has no urinary complaints.  Patient offers no further specific complaints today.  REVIEW OF SYSTEMS:   Review of Systems  Constitutional: Negative.  Negative for fever, malaise/fatigue and weight loss.  Respiratory: Negative.  Negative for cough, hemoptysis and shortness of breath.   Cardiovascular: Negative.  Negative for chest pain and leg swelling.  Gastrointestinal: Negative.  Negative for abdominal pain, blood in stool and melena.  Genitourinary: Negative.  Negative for dysuria.  Musculoskeletal: Negative.  Negative for back pain.  Skin: Negative.  Negative for rash.  Neurological: Negative.  Negative for focal weakness, weakness and headaches.  Psychiatric/Behavioral: Negative.  The patient is not nervous/anxious.     As per HPI. Otherwise, a complete review of systems is negative.  PAST MEDICAL HISTORY: Past Medical History:  Diagnosis Date  . Arthritis   . Cancer ([place] 01/2018   left breast   . Cataract   . Chronic headaches   . CKD  (chronic kidney disease)   . Diverticulitis   . Family history of ovarian cancer   . History of syncope   . Hypertension   . Multinodular goiter   . Primary osteoarthritis of left knee   . Pulmonary hypertension ([place]     PAST SURGICAL HISTORY: Past Surgical History:  Procedure Laterality Date  . ABDOMINAL HYSTERECTOMY    . APPENDECTOMY    . BREAST BIOPSY Left 01/03/2018   left breast stereo/x clip/path pending  . BREAST LUMPECTOMY Left 01/12/2018  . BREAST LUMPECTOMY WITH NEEDLE LOCALIZATION Left 01/12/2018   Procedure: BREAST LUMPECTOMY WITH NEEDLE LOCALIZATION;  Surgeon: PJules Husbands MD;  Location: [facility] ORS;  Service: General;  Laterality: Left;  . CHOLECYSTECTOMY    . COLON SURGERY    . COLOSTOMY  2008  . COLOSTOMY REVERSAL  2008  . EYE SURGERY    . REPLACEMENT TOTAL KNEE Right 2018  . SENTINEL NODE BIOPSY Left 03/07/2018   Procedure: SENTINEL NODE BIOPSY;  Surgeon: PJules Husbands MD;  Location: [facility] ORS;  Service: General;  Laterality: Left;    FAMILY HISTORY: Family History  Problem Relation Age of Onset  . Lung cancer Mother 728 . Diabetes Father   . Stroke Father   . Hypertension Maternal Grandmother   . Hypertension Maternal Uncle   . Hypertension Maternal Aunt   . Ovarian cancer Maternal Aunt 536 . Heart disease Daughter     ADVANCED DIRECTIVES (Y/N):  N  HEALTH MAINTENANCE: Social History   Tobacco Use  . Smoking status: Never Smoker  . Smokeless tobacco: Never Used  Substance Use Topics  . Alcohol use:  Never    Frequency: Never  . Drug use: Never     Colonoscopy:  PAP:  Bone density:  Lipid panel:  Allergies  Allergen Reactions  . Codeine Nausea Only  . Morphine And Related Nausea Only    Current Outpatient Medications  Medication Sig Dispense Refill  . acetaminophen (TYLENOL) 500 MG tablet Take 1,000 mg by mouth every 6 (six) hours as needed for moderate pain or headache.     Marland Kitchen amLODipine (NORVASC) 5 MG tablet TAKE 1 TABLET(5 MG)  BY MOUTH DAILY 90 tablet 1  . aspirin 81 MG chewable tablet Chew 81 mg by mouth daily.     . Blood Glucose Monitoring Suppl (ONE TOUCH ULTRA 2) w/Device KIT Use to check blood sugar daily, up to 2 times if need 1 each 0  . cloNIDine (CATAPRES) 0.1 MG tablet TAKE 1 TABLET BY MOUTH DAILY AS NEEDED FOR ELEVATED BLOOD PRESSURE GREATER THAN 180/100/HEADACHE 30 tablet 2  . gabapentin (NEURONTIN) 100 MG capsule Start 1 capsule daily, increase by 1 cap every 1 week as tolerated, next add 1 in evening, up to max 3 pills in evening (Patient taking differently: Take 100 mg by mouth as directed. One in the morning and two tablets at bedtime) 90 capsule 1  . Lancets (ONETOUCH ULTRASOFT) lancets Use to check blood sugar daily, up to 2 times if need 100 each 3  . lisinopril-hydrochlorothiazide (ZESTORETIC) 20-12.5 MG tablet Take 1 tablet by mouth daily. 90 tablet 1  . ONE TOUCH ULTRA TEST test strip Use to check blood sugar daily, up to 2 times if need 100 each 3  . pravastatin (PRAVACHOL) 20 MG tablet Take 1 tablet (20 mg total) by mouth at bedtime. 90 tablet 3  . predniSONE (STERAPRED UNI-PAK 21 TAB) 10 MG (21) TBPK tablet Take 6 tablets (76m) on day one, 5 tablets (549m day two, 4 tablets (4016mday three, 3 tablets (2m3may four, 2 tablets (20mg12my five, 1 tablet (10mg)37m six 21 tablet 0   No current facility-administered medications for this visit.     OBJECTIVE: Vitals:   11/24/18 1044  BP: (!) 153/55  Pulse: 60  Resp: 18  Temp: 98.9 F (37.2 C)     Body mass index is 29.41 kg/m.    ECOG FS:0 - Asymptomatic  General: Well-developed, well-nourished, no acute distress. Eyes: Pink conjunctiva, anicteric sclera. HEENT: Normocephalic, moist mucous membranes. Breast: Left breast and axilla with well-healed surgical scars and no evidence of recurrence.  Tender to palpation. Lungs: Clear to auscultation bilaterally. Heart: Regular rate and rhythm. No rubs, murmurs, or gallops. Abdomen: Soft,  nontender, nondistended. No organomegaly noted, normoactive bowel sounds. Musculoskeletal: No edema, cyanosis, or clubbing. Neuro: Alert, answering all questions appropriately. Cranial nerves grossly intact. Skin: No rashes or petechiae noted. Psych: Normal affect.  LAB RESULTS:  Lab Results  Component Value Date   NA 139 04/10/2018   K 3.5 04/10/2018   CL 105 04/10/2018   CO2 26 04/10/2018   GLUCOSE 84 04/10/2018   BUN 18 04/10/2018   CREATININE 1.08 (H) 04/10/2018   CALCIUM 8.5 (L) 04/10/2018   PROT 7.1 04/09/2018   ALBUMIN 3.1 (L) 04/09/2018   AST 24 04/09/2018   ALT 9 04/09/2018   ALKPHOS 42 04/09/2018   BILITOT 0.9 04/09/2018   GFRNONAA 46 (L) 04/10/2018   GFRAA 54 (L) 04/10/2018    Lab Results  Component Value Date   WBC 5.5 05/08/2018   NEUTROABS 2.4 08/06/2017   HGB  11.4 (L) 05/08/2018   HCT 35.6 (L) 05/08/2018   MCV 84.8 05/08/2018   PLT 256 05/08/2018     STUDIES: No results found.  ASSESSMENT: Stage Ia ER/PR positive, HER-2 negative invasive carcinoma of the left upper outer quadrant breast.    PLAN:  1. Stage Ia ER/PR positive, HER-2 negative invasive carcinoma of the left upper outer quadrant breast: Given patient's advanced age, Oncotype DX was not ordered since patient declined chemotherapy.  Patient completed adjuvant XRT in approximately April 2020.  Patient refuses treatment with letrozole despite acknowledging the increased risk of recurrence.  No intervention is needed at this time.  Return to clinic in 3 months for routine evaluation. 2.  Breast pain: Possibly radiation recall.  Patient was given a prednisone taper.  I have also referred back to surgery for further evaluation. 3.  Osteopenia: Patient's most recent bone mineral density on September 08, 2018 reported T score of -1.7.  I recommended calcium and vitamin D supplementation.  Since patient has refused letrozole, bone marrow densities can be monitored by primary care moving forward.     Patient expressed understanding and was in agreement with this plan. She also understands that She can call clinic at any time with any questions, concerns, or complaints.   Cancer Staging Primary cancer of upper outer quadrant of left female breast [facility]) Staging form: Breast, AJCC 8th Edition - Clinical stage from 01/11/2018: Stage IA (cT1b, cN0, cM0, G2, ER+, PR+, HER2-) - Signed by Lloyd Huger, MD on 02/10/2018   Lloyd Huger, MD   11/25/2018 7:08 AM

## 2018-11-23 NOTE — Progress Notes (Signed)
Patient is coming in for follow up, she is doing well

## 2018-11-24 ENCOUNTER — Encounter: Payer: Self-pay | Admitting: Oncology

## 2018-11-24 ENCOUNTER — Other Ambulatory Visit: Payer: Self-pay

## 2018-11-24 ENCOUNTER — Inpatient Hospital Stay: Payer: Medicare Other | Attending: Oncology | Admitting: Oncology

## 2018-11-24 VITALS — BP 153/55 | HR 60 | Temp 98.9°F | Resp 18 | Wt 150.6 lb

## 2018-11-24 DIAGNOSIS — Z885 Allergy status to narcotic agent status: Secondary | ICD-10-CM | POA: Diagnosis not present

## 2018-11-24 DIAGNOSIS — Z801 Family history of malignant neoplasm of trachea, bronchus and lung: Secondary | ICD-10-CM | POA: Diagnosis not present

## 2018-11-24 DIAGNOSIS — Z823 Family history of stroke: Secondary | ICD-10-CM | POA: Diagnosis not present

## 2018-11-24 DIAGNOSIS — I10 Essential (primary) hypertension: Secondary | ICD-10-CM | POA: Insufficient documentation

## 2018-11-24 DIAGNOSIS — C50412 Malignant neoplasm of upper-outer quadrant of left female breast: Secondary | ICD-10-CM | POA: Diagnosis not present

## 2018-11-24 DIAGNOSIS — Z833 Family history of diabetes mellitus: Secondary | ICD-10-CM | POA: Diagnosis not present

## 2018-11-24 DIAGNOSIS — N644 Mastodynia: Secondary | ICD-10-CM | POA: Diagnosis not present

## 2018-11-24 DIAGNOSIS — Z8041 Family history of malignant neoplasm of ovary: Secondary | ICD-10-CM | POA: Insufficient documentation

## 2018-11-24 DIAGNOSIS — Z170 Estrogen receptor positive status [ER+]: Secondary | ICD-10-CM | POA: Insufficient documentation

## 2018-11-24 DIAGNOSIS — Z8249 Family history of ischemic heart disease and other diseases of the circulatory system: Secondary | ICD-10-CM | POA: Insufficient documentation

## 2018-11-24 DIAGNOSIS — Z79899 Other long term (current) drug therapy: Secondary | ICD-10-CM | POA: Insufficient documentation

## 2018-11-24 DIAGNOSIS — M8580 Other specified disorders of bone density and structure, unspecified site: Secondary | ICD-10-CM | POA: Insufficient documentation

## 2018-11-24 NOTE — Progress Notes (Signed)
Pt in for follow up, reports been having pain in left breast for 3 weeks.

## 2018-11-25 MED ORDER — PREDNISONE 10 MG (21) PO TBPK: ORAL_TABLET | ORAL | 0 refills | Status: DC

## 2018-12-20 DIAGNOSIS — R1084 Generalized abdominal pain: Secondary | ICD-10-CM | POA: Diagnosis not present

## 2018-12-20 DIAGNOSIS — K529 Noninfective gastroenteritis and colitis, unspecified: Secondary | ICD-10-CM | POA: Diagnosis not present

## 2018-12-20 DIAGNOSIS — C50919 Malignant neoplasm of unspecified site of unspecified female breast: Secondary | ICD-10-CM | POA: Diagnosis not present

## 2018-12-20 DIAGNOSIS — R112 Nausea with vomiting, unspecified: Secondary | ICD-10-CM | POA: Diagnosis not present

## 2018-12-20 DIAGNOSIS — I10 Essential (primary) hypertension: Secondary | ICD-10-CM | POA: Diagnosis not present

## 2018-12-20 DIAGNOSIS — R300 Dysuria: Secondary | ICD-10-CM | POA: Diagnosis not present

## 2018-12-20 DIAGNOSIS — K5641 Fecal impaction: Secondary | ICD-10-CM | POA: Diagnosis not present

## 2018-12-21 DIAGNOSIS — R109 Unspecified abdominal pain: Secondary | ICD-10-CM | POA: Diagnosis not present

## 2019-02-16 NOTE — Progress Notes (Deleted)
[facility]  [phone] [phone]  ID: [name] OB: 03-02-1929  MR#: [id number]  FGH#:[id number]  Patient Care Team: [name], DO as PCP - General (Family Medicine)   CHIEF COMPLAINT: Stage Ia ER/PR positive, HER-2 negative invasive carcinoma of the left upper outer quadrant breast.   INTERVAL HISTORY: Patient returns to clinic today for routine 29-monthevaluation.  She continues to refuse treatment with letrozole despite acknowledging the increased risk of recurrence.  She is having significant left breast pain, but otherwise feels well.  She continues to remain active spending as much time as she can in her garden. She has no neurologic complaints.  She denies any recent fevers or illnesses.  She has a good appetite and denies weight loss.  She denies any chest pain, shortness of breath, cough, or hemoptysis.  She denies any nausea, vomiting, constipation, or diarrhea.  She has no urinary complaints.  Patient offers no further specific complaints today.  REVIEW OF SYSTEMS:   Review of Systems  Constitutional: Negative.  Negative for fever, malaise/fatigue and weight loss.  Respiratory: Negative.  Negative for cough, hemoptysis and shortness of breath.   Cardiovascular: Negative.  Negative for chest pain and leg swelling.  Gastrointestinal: Negative.  Negative for abdominal pain, blood in stool and melena.  Genitourinary: Negative.  Negative for dysuria.  Musculoskeletal: Negative.  Negative for back pain.  Skin: Negative.  Negative for rash.  Neurological: Negative.  Negative for focal weakness, weakness and headaches.  Psychiatric/Behavioral: Negative.  The patient is not nervous/anxious.     As per HPI. Otherwise, a complete review of systems is negative.  PAST MEDICAL HISTORY: Past Medical History:  Diagnosis Date  . Arthritis   . [place]   left breast   . Cataract   . Chronic headaches   . CKD  (chronic kidney disease)   . Diverticulitis   . Family history of ovarian cancer   . History of syncope   . Hypertension   . Multinodular goiter   . Primary osteoarthritis of left knee   . [place]monary [place]     PAST SURGICAL HISTORY: Past Surgical History:  Procedure Laterality Date  . ABDOMINAL HYSTERECTOMY    . APPENDECTOMY    . BREAST BIOPSY Left 01/03/2018   left breast stereo/x clip/path pending  . BREAST LUMPECTOMY Left 01/12/2018  . BREAST LUMPECTOMY WITH NEEDLE LOCALIZATION Left 01/12/2018   Procedure: BREAST LUMPECTOMY WITH NEEDLE LOCALIZATION;  Surgeon: PJules Husbands MD;  Location: [facility] ORS;  Service: General;  Laterality: Left;  . CHOLECYSTECTOMY    . COLON SURGERY    . COLOSTOMY  2008  . COLOSTOMY REVERSAL  2008  . EYE SURGERY    . REPLACEMENT TOTAL KNEE Right 2018  . SENTINEL NODE BIOPSY Left 03/07/2018   Procedure: SENTINEL NODE BIOPSY;  Surgeon: PJules Husbands MD;  Location: [facility] ORS;  Service: General;  Laterality: Left;    FAMILY HISTORY: Family History  Problem Relation Age of Onset  . Lung cancer Mother 783 . Diabetes Father   . Stroke Father   . Hypertension Maternal Grandmother   . Hypertension Maternal Uncle   . Hypertension Maternal Aunt   . Ovarian cancer Maternal Aunt 557 . Heart disease Daughter     ADVANCED DIRECTIVES (Y/N):  N  HEALTH MAINTENANCE: Social History   Tobacco Use  . Smoking status: Never Smoker  . Smokeless tobacco: Never Used  Substance Use Topics  . Alcohol use:  Never  . Drug use: Never     Colonoscopy:  PAP:  Bone density:  Lipid panel:  Allergies  Allergen Reactions  . Codeine Nausea Only  . Morphine And Related Nausea Only    Current Outpatient Medications  Medication Sig Dispense Refill  . acetaminophen (TYLENOL) 500 MG tablet Take 1,000 mg by mouth every 6 (six) hours as needed for moderate pain or headache.     Marland Kitchen amLODipine (NORVASC) 5 MG tablet TAKE 1 TABLET(5 MG) BY MOUTH DAILY 90  tablet 1  . aspirin 81 MG chewable tablet Chew 81 mg by mouth daily.     . Blood Glucose Monitoring Suppl (ONE TOUCH ULTRA 2) w/Device KIT Use to check blood sugar daily, up to 2 times if need 1 each 0  . cloNIDine (CATAPRES) 0.1 MG tablet TAKE 1 TABLET BY MOUTH DAILY AS NEEDED FOR ELEVATED BLOOD PRESSURE GREATER THAN 180/100/HEADACHE 30 tablet 2  . gabapentin (NEURONTIN) 100 MG capsule Start 1 capsule daily, increase by 1 cap every 1 week as tolerated, next add 1 in evening, up to max 3 pills in evening (Patient taking differently: Take 100 mg by mouth as directed. One in the morning and two tablets at bedtime) 90 capsule 1  . Lancets (ONETOUCH ULTRASOFT) lancets Use to check blood sugar daily, up to 2 times if need 100 each 3  . lisinopril-hydrochlorothiazide (ZESTORETIC) 20-12.5 MG tablet Take 1 tablet by mouth daily. 90 tablet 1  . ONE TOUCH ULTRA TEST test strip Use to check blood sugar daily, up to 2 times if need 100 each 3  . pravastatin (PRAVACHOL) 20 MG tablet Take 1 tablet (20 mg total) by mouth at bedtime. 90 tablet 3  . predniSONE (STERAPRED UNI-PAK 21 TAB) 10 MG (21) TBPK tablet Take 6 tablets (79m) on day one, 5 tablets (512m day two, 4 tablets (4056mday three, 3 tablets (54m50may four, 2 tablets (20mg65my five, 1 tablet (10mg)33m six 21 tablet 0   No current facility-administered medications for this visit.    OBJECTIVE: There were no vitals filed for this visit.   There is no height or weight on file to calculate BMI.    ECOG FS:0 - Asymptomatic  General: Well-developed, well-nourished, no acute distress. Eyes: Pink conjunctiva, anicteric sclera. HEENT: Normocephalic, moist mucous membranes. Breast: Left breast and axilla with well-healed surgical scars and no evidence of recurrence.  Tender to palpation. Lungs: Clear to auscultation bilaterally. Heart: Regular rate and rhythm. No rubs, murmurs, or gallops. Abdomen: Soft, nontender, nondistended. No organomegaly noted,  normoactive bowel sounds. Musculoskeletal: No edema, cyanosis, or clubbing. Neuro: Alert, answering all questions appropriately. Cranial nerves grossly intact. Skin: No rashes or petechiae noted. Psych: Normal affect.  LAB RESULTS:  Lab Results  Component Value Date   NA 139 04/10/2018   K 3.5 04/10/2018   CL 105 04/10/2018   CO2 26 04/10/2018   GLUCOSE 84 04/10/2018   BUN 18 04/10/2018   CREATININE 1.08 (H) 04/10/2018   CALCIUM 8.5 (L) 04/10/2018   PROT 7.1 04/09/2018   ALBUMIN 3.1 (L) 04/09/2018   AST 24 04/09/2018   ALT 9 04/09/2018   ALKPHOS 42 04/09/2018   BILITOT 0.9 04/09/2018   GFRNONAA 46 (L) 04/10/2018   GFRAA 54 (L) 04/10/2018    Lab Results  Component Value Date   WBC 5.5 05/08/2018   NEUTROABS 2.4 08/06/2017   HGB 11.4 (L) 05/08/2018   HCT 35.6 (L) 05/08/2018   MCV 84.8 05/08/2018  PLT 256 05/08/2018     STUDIES: No results found.  ASSESSMENT: Stage Ia ER/PR positive, HER-2 negative invasive carcinoma of the left upper outer quadrant breast.    PLAN:  1. Stage Ia ER/PR positive, HER-2 negative invasive carcinoma of the left upper outer quadrant breast: Given patient's advanced age, Oncotype DX was not ordered since patient declined chemotherapy.  Patient completed adjuvant XRT in approximately April 2020.  Patient refuses treatment with letrozole despite acknowledging the increased risk of recurrence.  No intervention is needed at this time.  Return to clinic in 3 months for routine evaluation. 2.  Breast pain: Possibly radiation recall.  Patient was given a prednisone taper.  I have also referred back to surgery for further evaluation. 3.  Osteopenia: Patient's most recent bone mineral density on September 08, 2018 reported T score of -1.7.  I recommended calcium and vitamin D supplementation.  Since patient has refused letrozole, bone marrow densities can be monitored by primary care moving forward.    Patient expressed understanding and was in  agreement with this plan. She also understands that She can call clinic at any time with any questions, concerns, or complaints.   Cancer Staging Primary cancer of upper outer quadrant of left female breast [facility]) Staging form: Breast, AJCC 8th Edition - Clinical stage from 01/11/2018: Stage IA (cT1b, cN0, cM0, G2, ER+, PR+, HER2-) - Signed by Lloyd Huger, MD on 02/10/2018   Lloyd Huger, MD   02/16/2019 2:44 PM

## 2019-02-23 ENCOUNTER — Inpatient Hospital Stay: Payer: Medicare Other | Admitting: Oncology

## 2019-02-24 ENCOUNTER — Other Ambulatory Visit: Payer: Self-pay | Admitting: Family Medicine

## 2019-02-24 DIAGNOSIS — I10 Essential (primary) hypertension: Secondary | ICD-10-CM

## 2019-03-19 ENCOUNTER — Ambulatory Visit (INDEPENDENT_AMBULATORY_CARE_PROVIDER_SITE_OTHER): Payer: Medicare Other | Admitting: Family Medicine

## 2019-03-19 ENCOUNTER — Other Ambulatory Visit: Payer: Self-pay

## 2019-03-19 ENCOUNTER — Telehealth: Payer: Self-pay

## 2019-03-19 ENCOUNTER — Encounter: Payer: Self-pay | Admitting: Family Medicine

## 2019-03-19 DIAGNOSIS — R519 Headache, unspecified: Secondary | ICD-10-CM | POA: Diagnosis not present

## 2019-03-19 DIAGNOSIS — G8929 Other chronic pain: Secondary | ICD-10-CM | POA: Diagnosis not present

## 2019-03-19 MED ORDER — GABAPENTIN 300 MG PO CAPS: 300.0000 mg | ORAL_CAPSULE | Freq: Two times a day (BID) | ORAL | 1 refills | Status: DC

## 2019-03-19 NOTE — Telephone Encounter (Signed)
Acknowledged. Thank you. Will follow up based on neurology recommendations.  [name], [facility] 03/19/2019, 11:31 AM
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
## 2019-03-19 NOTE — Telephone Encounter (Signed)
I called [facility] Neurology per Dr. Parks Ranger verbal request to schedule a f/u appt for the patient concerning her worsening headaches. Appointment scheduled for tomorrow with Chipper Herb, NP at 9:30am. I also called and notified the patient of the appointment.

## 2019-03-22 ENCOUNTER — Telehealth: Payer: Self-pay | Admitting: Family Medicine

## 2019-03-22 NOTE — Chronic Care Management (AMB) (Signed)
  Chronic Care Management   Note  03/22/2019 Name: Ellen [name] MRN: [id number] DOB: 1929-10-20  Ellen [name] is a 86 y.o. year old female who is a primary care patient of [name], DO. I reached out to [employer] by phone today in response to a referral sent by Ellen [name] health plan.     Ms. Riemenschneider was given information about Chronic Care Management services today including:  1. CCM service includes personalized support from designated clinical staff supervised by her physician, including individualized plan of care and coordination with other care providers 2. 24/7 contact phone numbers for assistance for urgent and routine care needs. 3. Service will only be billed when office clinical staff spend 20 minutes or more in a month to coordinate care. 4. Only one practitioner may furnish and bill the service in a calendar month. 5. The patient may stop CCM services at any time (effective at the end of the month) by phone call to the office staff. 6. The patient will be responsible for cost sharing (co-pay) of up to 20% of the service fee (after annual deductible is met).  Patient agreed to services and verbal consent obtained.   Follow up plan: Telephone appointment with care management team member scheduled for:04/23/2019  Noreene Larsson, [address], [place], [place] [address] Direct Dial: [phone] [email or web address] Website: [email or web address]

## 2019-04-01 ENCOUNTER — Ambulatory Visit: Payer: Medicare Other | Attending: Internal Medicine

## 2019-04-01 DIAGNOSIS — Z23 Encounter for immunization: Secondary | ICD-10-CM

## 2019-04-01 NOTE — Progress Notes (Signed)
   Covid-19 Vaccination Clinic  Name:  Ellen [name]    MRN: [id number] DOB: [birth year]  04/01/2019  Ms. [name] was observed post Covid-19 immunization for 15 minutes without incidence. She was provided with Vaccine Information Sheet and instruction to access the V-Safe system.   Ms. [name] was instructed to call 911 with any severe reactions post vaccine: Marland Kitchen Difficulty breathing  . Swelling of your face and throat  . A fast heartbeat  . A bad rash all over your body  . Dizziness and weakness    Immunizations Administered    Name Date Dose VIS Date Route   Pfizer COVID-19 Vaccine 04/01/2019 10:00 AM 0.3 mL 01/12/2019 Intramuscular   Manufacturer: [place]   Lot: [id number]   [place]: [id number]

## 2019-04-23 ENCOUNTER — Telehealth: Payer: Medicare Other | Admitting: General Practice

## 2019-04-23 ENCOUNTER — Ambulatory Visit (INDEPENDENT_AMBULATORY_CARE_PROVIDER_SITE_OTHER): Payer: Medicare Other | Admitting: General Practice

## 2019-04-23 ENCOUNTER — Other Ambulatory Visit: Payer: Self-pay | Admitting: Family Medicine

## 2019-04-23 DIAGNOSIS — I10 Essential (primary) hypertension: Secondary | ICD-10-CM

## 2019-04-23 DIAGNOSIS — E1121 Type 2 diabetes mellitus with diabetic nephropathy: Secondary | ICD-10-CM | POA: Diagnosis not present

## 2019-04-23 DIAGNOSIS — E1169 Type 2 diabetes mellitus with other specified complication: Secondary | ICD-10-CM

## 2019-04-23 DIAGNOSIS — G8929 Other chronic pain: Secondary | ICD-10-CM

## 2019-04-23 DIAGNOSIS — E785 Hyperlipidemia, unspecified: Secondary | ICD-10-CM | POA: Diagnosis not present

## 2019-04-23 MED ORDER — CLONIDINE HCL 0.1 MG PO TABS: ORAL_TABLET | ORAL | 2 refills | Status: DC

## 2019-04-23 NOTE — Patient Instructions (Signed)
Visit Information  Goals Addressed            This Visit's Progress    RNCM: Pt- "I take my blood pressure sometimes" (pt-stated)       CARE PLAN ENTRY (see longtitudinal plan of care for additional care plan information)  Current Barriers:   Chronic Disease Management support, education, and care coordination needs related to HTN, HLD, DMII, and Chronic headache pain   Lack of social support to help if needs to go to appointments and can not drive  Clinical Goal(s) related to HTN, HLD, DMII, and Chronic headache pain :  Over the next 120 days, patient will:   Work with the care management team to address educational, disease management, and care coordination needs   Begin or continue self health monitoring activities as directed today Measure and record cbg (blood glucose) 2 times a week or as instructed by her pcp, Measure and record blood pressure 5 times per week, and adhere to a Heart healthy Diet  Call provider office for new or worsened signs and symptoms Blood glucose findings outside established parameters, Blood pressure findings outside established parameters, Shortness of breath, and New or worsened symptom related to HLD or pain unresolved with chronic headaches and discomfort.   Call care management team with questions or concerns  Verbalize basic understanding of patient centered plan of care established today  Interventions related to HTN, HLD, DMII, and Chronic headache pain :   Evaluation of current treatment plans and patient's adherence to plan as established by provider. The patient has a neurologist but she says she does not have the ability to do Facetime so she has not been able to follow up with them. She feels she needs a CT scan. The headaches are now happening mainly at night.  She is taking 300mg  of Gabapentin BID.  She says when she has the headaches she really does not see where it is helping. At the time of the call she had no headache pain.    Assessed patient understanding of disease states- the patient says she does not take anything for blood sugar elevation. She does not check her blood sugars often. She says she knows when it gets elevated because of the way she feels. Education on asking the provider how often she should take her blood sugar readings and record. Education on hyperglycemia and hypoglycemia.   Assessed patient's education and care coordination needs- the patient takes her blood pressure readings and records. Has not been taking during episodes of headaches. Ask the patient to take blood pressures when she is having headaches to see what they are running. She says when she has one of the headaches it feels like a "train" running through her head and in her ears.  She is prescribed Clonidine 0.1 mg for blood pressure >180/100/headache but she states the prescription has expired. Will ask Dr. Parks Ranger for recommendations.   Notified the front desk staff to please call and get the patient an appointment for blood work and to see Dr. Parks Ranger. The patient is concerned that the "cholesterol medicine is hurting my kidneys".  Someone told her that cholesterol medication damages kidneys.  Education and support given.   Provided disease specific education to patient- education on the benefits of heart healthy diet.  The patient tries to watch her sodium. Education on low sodium options. The patient loves to fish and eats a lot of fish. The patient educated on diet high in sodium can cause issues with  blood pressure, kidneys, and eyes.    Collaborated with appropriate clinical care team members regarding patient needs- the patient denies any concerns for LCSW and pharmacist.   Patient Self Care Activities related to CAD, HLD, DMII, and Chronic headache pain :   Patient is unable to independently self-manage chronic health conditions  Initial goal documentation        Patient verbalizes understanding of instructions  provided today.   Telephone follow up appointment with care management team member scheduled for: 06-21-2019 at 0900  Reform, MSN, [place] Mobile: [phone]

## 2019-04-23 NOTE — Chronic Care Management (AMB) (Signed)
Chronic Care Management   Follow Up Note   04/23/2019 Name: Ellen [name] MRN: [id number] DOB: Oct 16, 1929  Referred by: [name], DO Reason for referral : Chronic Care Management (Inital: HTN/DM2/HLD- Chronic headache)   Ellen [name] is a 86 y.o. year old female who is a primary care patient of [name], DO. The CCM team was consulted for assistance with chronic disease management and care coordination needs.    Review of patient status, including review of consultants reports, relevant laboratory and other test results, and collaboration with appropriate care team members and the patient's provider was performed as part of comprehensive patient evaluation and provision of chronic care management services.    SDOH (Social Determinants of Health) assessments performed: Yes See Care Plan activities for detailed interventions related to SDOH)  SDOH Interventions     Most Recent Value  SDOH Interventions  SDOH Interventions for the Following Domains  Social Connections  Social Connections Interventions  Other (Comment) [has friends she goes fishing with. Has adult children]       Outpatient Encounter Medications as of 04/23/2019  Medication Sig  . acetaminophen (TYLENOL) 500 MG tablet Take 1,000 mg by mouth every 6 (six) hours as needed for moderate pain or headache.   Marland Kitchen amLODipine (NORVASC) 5 MG tablet TAKE 1 TABLET(5 MG) BY MOUTH DAILY  . aspirin 81 MG chewable tablet Chew 81 mg by mouth daily.   . Blood Glucose Monitoring Suppl (ONE TOUCH ULTRA 2) w/Device KIT Use to check blood sugar daily, up to 2 times if need  . gabapentin (NEURONTIN) 300 MG capsule Take 1 capsule (300 mg total) by mouth in the morning and at bedtime.  . Garlic 10 MG CAPS Take by mouth.  . Lancets (ONETOUCH ULTRASOFT) lancets Use to check blood sugar daily, up to 2 times if need  . lisinopril-hydrochlorothiazide (ZESTORETIC) 20-12.5 MG tablet TAKE 1 TABLET BY MOUTH DAILY  .  Multiple Vitamins-Minerals (CERTAVITE SENIOR/ANTIOXIDANT) TABS Take by mouth.  . ONE TOUCH ULTRA TEST test strip Use to check blood sugar daily, up to 2 times if need  . pravastatin (PRAVACHOL) 20 MG tablet Take 1 tablet (20 mg total) by mouth at bedtime.  . cloNIDine (CATAPRES) 0.1 MG tablet TAKE 1 TABLET BY MOUTH DAILY AS NEEDED FOR ELEVATED BLOOD PRESSURE GREATER THAN 180/100/HEADACHE (Patient not taking: Reported on 03/19/2019)   No facility-administered encounter medications on file as of 04/23/2019.     Objective:   Goals Addressed            This Visit's Progress   . RNCM: Pt- "I take my blood pressure sometimes" (pt-stated)       CARE PLAN ENTRY (see longtitudinal plan of care for additional care plan information)  Current Barriers:  . Chronic Disease Management support, education, and care coordination needs related to HTN, HLD, DMII, and Chronic headache pain  . Lack of social support to help if needs to go to appointments and can not drive  Clinical Goal(s) related to HTN, HLD, DMII, and Chronic headache pain :  Over the next 120 days, patient will:  . Work with the care management team to address educational, disease management, and care coordination needs  . Begin or continue self health monitoring activities as directed today Measure and record cbg (blood glucose) 2 times a week or as instructed by her pcp, Measure and record blood pressure 5 times per week, and adhere to a Heart healthy Diet . Call provider office for new or  worsened signs and symptoms Blood glucose findings outside established parameters, Blood pressure findings outside established parameters, Shortness of breath, and New or worsened symptom related to HLD or pain unresolved with chronic headaches and discomfort.  . Call care management team with questions or concerns . Verbalize basic understanding of patient centered plan of care established today  Interventions related to HTN, HLD, DMII, and Chronic  headache pain :  . Evaluation of current treatment plans and patient's adherence to plan as established by provider. The patient has a neurologist but she says she does not have the ability to do Facetime so she has not been able to follow up with them. She feels she needs a CT scan. The headaches are now happening mainly at night.  She is taking 38m of Gabapentin BID.  She says when she has the headaches she really does not see where it is helping. At the time of the call she had no headache pain.  . Assessed patient understanding of disease states- the patient says she does not take anything for blood sugar elevation. She does not check her blood sugars often. She says she knows when it gets elevated because of the way she feels. Education on asking the provider how often she should take her blood sugar readings and record. Education on hyperglycemia and hypoglycemia.  . Assessed patient's education and care coordination needs- the patient takes her blood pressure readings and records. Has not been taking during episodes of headaches. Ask the patient to take blood pressures when she is having headaches to see what they are running. She says when she has one of the headaches it feels like a "train" running through her head and in her ears.  She is prescribed Clonidine 0.1 mg for blood pressure >180/100/headache but she states the prescription has expired. Will ask Dr. KParks Rangerfor recommendations.  . Notified the front desk staff to please call and get the patient an appointment for blood work and to see Dr. KParks Ranger The patient is concerned that the "cholesterol medicine is hurting my kidneys".  Someone told her that cholesterol medication damages kidneys.  Education and support given.  . Provided disease specific education to patient- education on the benefits of heart healthy diet.  The patient tries to watch her sodium. Education on low sodium options. The patient loves to fish and eats a lot of  fish. The patient educated on diet high in sodium can cause issues with blood pressure, kidneys, and eyes.   .Nash Dimmerwith appropriate clinical care team members regarding patient needs- the patient denies any concerns for LCSW and pharmacist.   Patient Self Care Activities related to CAD, HLD, DMII, and Chronic headache pain :  . Patient is unable to independently self-manage chronic health conditions  Initial goal documentation         Plan:   Telephone follow up appointment with care management team member scheduled for: Jun 21, 2019 at 0[place] MSN, [place]: [phone]

## 2019-04-24 ENCOUNTER — Ambulatory Visit: Payer: Medicare Other | Attending: Internal Medicine

## 2019-04-24 DIAGNOSIS — Z23 Encounter for immunization: Secondary | ICD-10-CM

## 2019-04-24 NOTE — Progress Notes (Signed)
   Covid-19 Vaccination Clinic  Name:  Ellen [name]    MRN: [id number] DOB: 1929-10-24  04/24/2019  Ellen [name] was observed post Covid-19 immunization for 15 minutes without incident. She was provided with Vaccine Information Sheet and instruction to access the V-Safe system.   Ellen [name] was instructed to call 911 with any severe reactions post vaccine: Marland Kitchen Difficulty breathing  . Swelling of face and throat  . A fast heartbeat  . A bad rash all over body  . Dizziness and weakness   Immunizations Administered    Name Date Dose VIS Date Route   Pfizer COVID-19 Vaccine 04/24/2019  1:09 PM 0.3 mL 01/12/2019 Intramuscular   Manufacturer: [place]   Lot: [id number]   [place]: [id number]

## 2019-05-04 ENCOUNTER — Other Ambulatory Visit: Payer: Self-pay

## 2019-05-04 ENCOUNTER — Encounter: Payer: Self-pay | Admitting: Family Medicine

## 2019-05-04 ENCOUNTER — Ambulatory Visit (INDEPENDENT_AMBULATORY_CARE_PROVIDER_SITE_OTHER): Payer: Medicare Other | Admitting: Family Medicine

## 2019-05-04 VITALS — BP 119/48 | HR 58 | Temp 97.5°F | Ht 60.0 in | Wt 151.0 lb

## 2019-05-04 DIAGNOSIS — R29818 Other symptoms and signs involving the nervous system: Secondary | ICD-10-CM

## 2019-05-04 DIAGNOSIS — R519 Headache, unspecified: Secondary | ICD-10-CM | POA: Diagnosis not present

## 2019-05-04 DIAGNOSIS — R200 Anesthesia of skin: Secondary | ICD-10-CM

## 2019-05-04 DIAGNOSIS — G8929 Other chronic pain: Secondary | ICD-10-CM | POA: Diagnosis not present
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
## 2019-05-04 NOTE — Patient Instructions (Addendum)
Thank you for coming to the office today.  Stay tuned for MRI scan at [facility], we will call you with apt.  I will call Neurologist next week and ask for their help.  Keep on gabapentin.  If severe worsening headache that doesn't resolve - then may go to hospital / 911  Please schedule a Follow-up Appointment to: Return if symptoms worsen or fail to improve, for headache.  If you have any other questions or concerns, please feel free to call the office or send a message through [place]. You may also schedule an earlier appointment if necessary.  Additionally, you may be receiving a survey about your experience at our office within a few days to 1 week by e-mail or mail. We value your feedback.  [name], DO [place]

## 2019-05-18 ENCOUNTER — Other Ambulatory Visit: Payer: Self-pay

## 2019-05-18 ENCOUNTER — Ambulatory Visit
Admission: RE | Admit: 2019-05-18 | Discharge: 2019-05-18 | Disposition: A | Discharge status: Home / Self Care | Payer: Medicare Other | Source: Ambulatory Visit | Attending: Family Medicine | Admitting: Family Medicine

## 2019-05-18 DIAGNOSIS — R519 Headache, unspecified: Secondary | ICD-10-CM | POA: Insufficient documentation

## 2019-05-18 DIAGNOSIS — G8929 Other chronic pain: Secondary | ICD-10-CM | POA: Insufficient documentation

## 2019-05-18 DIAGNOSIS — R200 Anesthesia of skin: Secondary | ICD-10-CM | POA: Diagnosis not present

## 2019-05-18 MED ORDER — GADOBUTROL 1 MMOL/ML IV SOLN
7.0000 mL | Freq: Once | INTRAVENOUS | Status: AC | PRN
  Administered 2019-05-18: 6 mL via INTRAVENOUS

## 2019-06-03 ENCOUNTER — Other Ambulatory Visit: Payer: Self-pay | Admitting: Family Medicine

## 2019-06-03 DIAGNOSIS — E785 Hyperlipidemia, unspecified: Secondary | ICD-10-CM

## 2019-06-03 DIAGNOSIS — I10 Essential (primary) hypertension: Secondary | ICD-10-CM

## 2019-06-03 DIAGNOSIS — E1169 Type 2 diabetes mellitus with other specified complication: Secondary | ICD-10-CM

## 2019-06-03 NOTE — Telephone Encounter (Signed)
Requested Prescriptions  Pending Prescriptions Disp Refills  . amLODipine (NORVASC) 5 MG tablet [Pharmacy Med Name: AMLODIPINE BESYLATE 5MG  TABLETS] 90 tablet 1    Sig: TAKE 1 TABLET(5 MG) BY MOUTH DAILY     Cardiovascular:  Calcium Channel Blockers Failed - 06/03/2019  3:46 AM      Failed - Last BP in normal range    BP Readings from Last 1 Encounters:  05/04/19 (!) 119/48         Passed - Valid encounter within last 6 months    Recent Outpatient Visits          1 month ago Headache with neurologic deficit   [place], DO   2 months ago Chronic intractable headache, unspecified headache type   [place], DO   8 months ago E. coli UTI   [facility] [place], Devonne Doughty, DO   11 months ago Type 2 diabetes mellitus with diabetic nephropathy, without long-term current use of insulin ([place])   [place], Devonne Doughty, DO   1 year ago [place], DO             . pravastatin (PRAVACHOL) 20 MG tablet [Pharmacy Med Name: PRAVASTATIN 20MG  TABLETS] 90 tablet 3    Sig: TAKE 1 TABLET(20 MG) BY MOUTH AT BEDTIME     Cardiovascular:  Antilipid - Statins Failed - 06/03/2019  3:46 AM      Failed - Total Cholesterol in normal range and within 360 days    No results found for: CHOL, POCCHOL, CHOLTOT       Failed - LDL in normal range and within 360 days    No results found for: LDLCALC, LDLC, HIRISKLDL, POCLDL, LDLDIRECT, REALLDLC, TOTLDLC       Failed - HDL in normal range and within 360 days    No results found for: HDL, POCHDL       Failed - Triglycerides in normal range and within 360 days    No results found for: TRIG, POCTRIG       Passed - Patient is not pregnant      Passed - Valid encounter within last 12 months    Recent Outpatient Visits          1 month ago Headache with neurologic deficit    [place], DO   2 months ago Chronic intractable headache, unspecified headache type   [place], DO   8 months ago E. coli UTI   [facility] [place], Devonne Doughty, DO   11 months ago Type 2 diabetes mellitus with diabetic nephropathy, without long-term current use of insulin ([place])   [facility] [name], DO   1 year ago [place], Devonne Doughty, [place]

## 2019-06-03 NOTE — Telephone Encounter (Signed)
Requested medications are due for refill today?  Yes  Requested medications are on active medication list? Yes  Last Refill:  04/12/2018  # 90 with 3 refills   Future visit scheduled?  No  Notes to Clinic:  Medication failed Rx refill protocol due to no labs within past 360 days.

## 2019-06-05 DIAGNOSIS — R519 Headache, unspecified: Secondary | ICD-10-CM | POA: Diagnosis not present

## 2019-06-21 ENCOUNTER — Telehealth: Payer: Self-pay

## 2019-06-21 ENCOUNTER — Ambulatory Visit: Payer: Self-pay | Admitting: General Practice

## 2019-06-21 DIAGNOSIS — Z79899 Other long term (current) drug therapy: Secondary | ICD-10-CM | POA: Diagnosis not present

## 2019-06-21 DIAGNOSIS — S02401A Maxillary fracture, unspecified, initial encounter for closed fracture: Secondary | ICD-10-CM | POA: Diagnosis not present

## 2019-06-21 DIAGNOSIS — S0240CA Maxillary fracture, right side, initial encounter for closed fracture: Secondary | ICD-10-CM | POA: Diagnosis not present

## 2019-06-21 DIAGNOSIS — I10 Essential (primary) hypertension: Secondary | ICD-10-CM | POA: Diagnosis not present

## 2019-06-21 DIAGNOSIS — R079 Chest pain, unspecified: Secondary | ICD-10-CM | POA: Diagnosis not present

## 2019-06-21 DIAGNOSIS — S01121A Laceration with foreign body of right eyelid and periocular area, initial encounter: Secondary | ICD-10-CM | POA: Diagnosis not present

## 2019-06-21 DIAGNOSIS — R0600 Dyspnea, unspecified: Secondary | ICD-10-CM | POA: Diagnosis not present

## 2019-06-21 DIAGNOSIS — Z96651 Presence of right artificial knee joint: Secondary | ICD-10-CM | POA: Diagnosis not present

## 2019-06-21 DIAGNOSIS — S01111A Laceration without foreign body of right eyelid and periocular area, initial encounter: Secondary | ICD-10-CM | POA: Diagnosis not present

## 2019-06-21 DIAGNOSIS — S0531XA Ocular laceration without prolapse or loss of intraocular tissue, right eye, initial encounter: Secondary | ICD-10-CM | POA: Diagnosis not present

## 2019-06-21 DIAGNOSIS — M25561 Pain in right knee: Secondary | ICD-10-CM | POA: Diagnosis not present

## 2019-06-21 DIAGNOSIS — S0990XA Unspecified injury of head, initial encounter: Secondary | ICD-10-CM | POA: Diagnosis not present

## 2019-06-21 DIAGNOSIS — S0231XA Fracture of orbital floor, right side, initial encounter for closed fracture: Secondary | ICD-10-CM | POA: Diagnosis not present

## 2019-06-21 DIAGNOSIS — Z7982 Long term (current) use of aspirin: Secondary | ICD-10-CM | POA: Diagnosis not present

## 2019-06-21 DIAGNOSIS — S80211A Abrasion, right knee, initial encounter: Secondary | ICD-10-CM | POA: Diagnosis not present

## 2019-06-21 DIAGNOSIS — Z23 Encounter for immunization: Secondary | ICD-10-CM | POA: Diagnosis not present

## 2019-06-21 DIAGNOSIS — S199XXA Unspecified injury of neck, initial encounter: Secondary | ICD-10-CM | POA: Diagnosis not present

## 2019-06-21 NOTE — Chronic Care Management (AMB) (Signed)
  Chronic Care Management   Outreach Note  06/21/2019 Name: Ellen [name] MRN: [id number] DOB: 1929-05-19  Referred by: [name], DO Reason for referral : Chronic Care Management (Follow up on Headaches/HTN/HLD/DM2 and other needs)   An unsuccessful telephone outreach was attempted today. The patient was referred to the case management team for assistance with care management and care coordination.   Follow Up Plan: The care management team will reach out to the patient again over the next 30 to 60 days.   Noreene Larsson RN, MSN, [place] Mobile: [phone]

## 2019-06-22 DIAGNOSIS — R079 Chest pain, unspecified: Secondary | ICD-10-CM | POA: Diagnosis not present

## 2019-06-22 DIAGNOSIS — R0600 Dyspnea, unspecified: Secondary | ICD-10-CM | POA: Diagnosis not present

## 2019-06-26 ENCOUNTER — Encounter: Payer: Self-pay | Admitting: Emergency Medicine

## 2019-06-26 ENCOUNTER — Encounter: Payer: Self-pay | Admitting: Family Medicine

## 2019-06-26 ENCOUNTER — Other Ambulatory Visit: Payer: Self-pay

## 2019-06-26 ENCOUNTER — Ambulatory Visit (INDEPENDENT_AMBULATORY_CARE_PROVIDER_SITE_OTHER): Payer: Medicare Other | Admitting: Family Medicine

## 2019-06-26 ENCOUNTER — Emergency Department
Admission: EM | Admit: 2019-06-26 | Discharge: 2019-06-26 | Disposition: A | Discharge status: Home / Self Care | Payer: Medicare Other | Attending: Emergency Medicine | Admitting: Emergency Medicine

## 2019-06-26 VITALS — BP 190/63 | HR 58 | Temp 96.9°F | Resp 16 | Ht 60.0 in | Wt 146.6 lb

## 2019-06-26 DIAGNOSIS — Z853 Personal history of malignant neoplasm of breast: Secondary | ICD-10-CM | POA: Insufficient documentation

## 2019-06-26 DIAGNOSIS — Z7982 Long term (current) use of aspirin: Secondary | ICD-10-CM | POA: Insufficient documentation

## 2019-06-26 DIAGNOSIS — C50412 Malignant neoplasm of upper-outer quadrant of left female breast: Secondary | ICD-10-CM | POA: Diagnosis not present

## 2019-06-26 DIAGNOSIS — Z4802 Encounter for removal of sutures: Secondary | ICD-10-CM

## 2019-06-26 DIAGNOSIS — Z79899 Other long term (current) drug therapy: Secondary | ICD-10-CM | POA: Insufficient documentation

## 2019-06-26 DIAGNOSIS — S02401A Maxillary fracture, unspecified, initial encounter for closed fracture: Secondary | ICD-10-CM | POA: Diagnosis not present

## 2019-06-26 DIAGNOSIS — N189 Chronic kidney disease, unspecified: Secondary | ICD-10-CM | POA: Insufficient documentation

## 2019-06-26 DIAGNOSIS — S0231XA Fracture of orbital floor, right side, initial encounter for closed fracture: Secondary | ICD-10-CM

## 2019-06-26 DIAGNOSIS — I129 Hypertensive chronic kidney disease with stage 1 through stage 4 chronic kidney disease, or unspecified chronic kidney disease: Secondary | ICD-10-CM | POA: Diagnosis not present

## 2019-06-26 DIAGNOSIS — E119 Type 2 diabetes mellitus without complications: Secondary | ICD-10-CM | POA: Insufficient documentation

## 2019-06-26 NOTE — ED Triage Notes (Signed)
Pt sent over from her MD office for suture removal. MD office was able to get some out but not the last one.

## 2019-06-26 NOTE — Progress Notes (Signed)
Subjective:    Patient ID: Ellen [name], female    DOB: Aug 18, 1929, 86 y.o.   MRN: [id number]  Ellen [name] is a 86 y.o. female presenting on 06/26/2019 for Suture / Staple Removal (7 suture right eyebrow.) and orbital / sinus fracture   HPI   ED FOLLOW-UP VISIT  Hospital/Location: [facility] ED / [facility] Date of ED Visit: 06/21/19  Reason for Presenting to ED: Fall, facial injury Primary (+Secondary) Diagnosis: Closed Orbital Floor R fracture non displaced, Non displaced R maxillary sinus fracture  FOLLOW-UP  - ED provider note and record have been reviewed - Patient presents today about 5 days after recent ED visit. Brief summary of recent course, patient had symptoms of accidental mechanical fall and suffered injury to R side of head/face. Presented to ED on 06/21/19, testing in ED with CT imaging and x-rays, highlighting CT facial sinus images below with fracture, treated with R eyebrow laceration x 7 sutures and asked to see ENT with referral for fracutre - Today reports overall has done well after discharge from ED. Symptoms of pain have improved but still has some pain. Has sutures ready to remove today. Also needs to see ENT. - Admits injured her L side as well on fall and does want to talk with her Oncologist to follow-up breast cancer   Depression screen [facility] 2/9 04/23/2019 09/21/2018 04/12/2018  Decreased Interest 0 0 0  Down, Depressed, Hopeless 0 0 0  PHQ - 2 Score 0 0 0    Social History   Tobacco Use  . Smoking status: Never Smoker  . Smokeless tobacco: Never Used  Substance Use Topics  . Alcohol use: Never  . Drug use: Never    Review of Systems Per HPI unless specifically indicated above     Objective:    BP (!) 190/63   Pulse (!) 58   Temp (!) 96.9 F (36.1 C) (Temporal)   Resp 16   Ht 5' (1.524 m)   Wt 146 lb 9.6 oz (66.5 kg)   SpO2 100%   BMI 28.63 kg/m   Wt Readings from Last 3 Encounters:  06/26/19 146 lb 9.6 oz (66.5 kg)  05/04/19 151  lb (68.5 kg)  11/24/18 150 lb 9.6 oz (68.3 kg)    Physical Exam Vitals and nursing note reviewed.  Constitutional:      General: She is not in acute distress.    Appearance: She is well-developed. She is not diaphoretic.     Comments: Well-appearing, comfortable, cooperative  HENT:     Head: Normocephalic and atraumatic.  Eyes:     General:        Right eye: No discharge.        Left eye: No discharge.     Conjunctiva/sclera: Conjunctivae normal.  Cardiovascular:     Rate and Rhythm: Normal rate.  Pulmonary:     Effort: Pulmonary effort is normal.  Skin:    General: Skin is warm and dry.     Findings: No erythema or rash.     Comments: Right facial area with mild edema periorbital, no erythema  7 x blue prolene simple interrupted sutures across R eyebrow  Neurological:     Mental Status: She is alert and oriented to person, place, and time.  Psychiatric:        Behavior: Behavior normal.     Comments: Well groomed, good eye contact, normal speech and thoughts     ________________________________________________________ PROCEDURE NOTE Date - 06/26/19 Suture Removal -  Right Eyebrow location - Location / Date Sutures were placed: [facility] / 06/21/19  - # of Sutures: 7 Discussed benefits and risks (including pain, bleeding, infection, wound separation). Verbal consent given by patient Medication: None  Time Out taken Examination of the sutured laceration on R eyebrow appears to be well healed. Area cleansed with alcohol wipes. Appropriate # of sutures counted and confirmed. Removal of sutures was attempted with suture removal kit, however due to small size of suture and close approximation to skin was unable to use the available suture removal scissors in our kit to successfully wedge under the suture and safely remove each suture. - Only one suture was successfully removed, from medial aspect the 2nd suture was removed. - 1 out of 7 sutures were successfully  removed, remaining sutures were left in place    I have personally reviewed the radiology report from 06/22/19 [facility] CT Head / Facial Bones.  CT Facial Bones Wo Contrast5/21/2021 [facility] Result Impression   Acute nondisplaced right orbital floor and posterolateral maxillary sinus wall fracture.  Signed (Electronic Signature): 06/22/2019 8:16 AM Signed By: Sherren Kerns, MD  Result Narrative   Reason For Procedure: fall, struck head on a tree root, laceration to right eyebrow, right sided facial pain ; Facial trauma  Procedure: CT FACIAL BONES WO CONTRAST  TECHNIQUE: .65 mm axial source images were obtained with contrast with multiplanar reconstructions. Contrast:   COMPARISON: No prior studies for comparison  FINDINGS:  Acute nondisplaced fracture involving the anterior right orbital floor with small amount of adjacent gas locules. The fracture extends into the anterior wall of right maxillary sinus. Additional fracture lucency along the posterolateral lateral wall of right maxillary sinus. Mild adjacent soft tissue gas and periorbital soft tissue edema. Small amount of right maxillary sinus fluid. Nasal bone, and mandible are intact. Globes are intact. No extraconal hematoma.  Other Result Information  Interface, Rad Results In - 06/22/2019  8:18 AM EDT Formatting of this note might be different from the original.  Reason For Procedure: fall, struck head on a tree root, laceration to right eyebrow, right sided facial pain ; Facial trauma  Procedure: CT FACIAL BONES WO CONTRAST  TECHNIQUE: .65 mm axial source images were obtained with contrast with multiplanar reconstructions. Contrast:   COMPARISON: No prior studies for comparison  FINDINGS:  Acute nondisplaced fracture involving the anterior right orbital floor with small amount of adjacent gas locules. The fracture extends into the anterior wall of right maxillary sinus. Additional fracture lucency along the  posterolateral lateral wall of right maxillary sinus. Mild adjacent soft tissue gas and periorbital soft tissue edema. Small amount of right maxillary sinus fluid. Nasal bone, and mandible are intact. Globes are intact. No extraconal hematoma.  IMPRESSION:  Acute nondisplaced right orbital floor and posterolateral maxillary sinus wall fracture.  Signed (Electronic Signature): 06/22/2019 8:16 AM Signed By:  Sherren Kerns, MD  Status   CT Head Wo Contrast5/21/2021 [facility] Result Narrative   PROCEDURE: CT HEAD WO CONTRAST  CLINICAL HISTORY: Altered mental status/cerebral vascular disease  COMPARISON: Previous available report and/or image comparisons reviewed  TECHNIQUE: 0.65 mm serial axial images were obtained without IV contrast using multiplanar reconstructions. Automated exposure control adjustment of MA and/or KV according to patient size   FINDINGS: Cortical atrophy and periventricular white matter disease are identified. There is no intracranial hemorrhage, midline shift or mass effect. The basilar cisterns are maintained.  The paranasal sinuses, middle ears and mastoids are  clear. Calvarium and soft tissues are unremarkable.   IMPRESSION:  1. No acute intracranial hemorrhage.  2. Chronic cerebral vascular disease.   Signed (Electronic Signature): 06/22/2019 8:18 AM Signed By: Sherren Kerns, MD  Other Result Information  Interface, Rad Results In - 06/22/2019  8:20 AM EDT Formatting of this note might be different from the original.  PROCEDURE: CT HEAD WO CONTRAST  CLINICAL HISTORY: Altered mental status/cerebral vascular disease  COMPARISON: Previous available report and/or image comparisons reviewed  TECHNIQUE: 0.65 mm serial axial images were obtained without IV contrast using multiplanar reconstructions. Automated exposure control adjustment of MA and/or KV according to patient size   FINDINGS: Cortical  atrophy and periventricular white matter  disease are identified. There is no intracranial hemorrhage, midline shift or mass effect. The basilar cisterns are maintained.  The paranasal sinuses, middle ears and mastoids are clear. Calvarium and soft tissues are unremarkable.   IMPRESSION:  1. No acute intracranial hemorrhage.  2. Chronic cerebral vascular disease.   Signed (Electronic Signature): 06/22/2019 8:18 AM Signed By:  Sherren Kerns, MD     Results for orders placed or performed in visit on 06/16/18  POCT glycosylated hemoglobin (Hb A1C)  Result Value Ref Range   Hemoglobin A1C 6.1 (A) 4.0 - 5.6 %      Assessment & Plan:   Problem List Items Addressed This Visit    Primary cancer of upper outer quadrant of left female breast ([place])    Followed by current team at [place] Dr Grayland Ormond and Gen Surgery Dr Dahlia Byes and Dr Baruch Gouty Rad-Onc S/p biopsy diagnosis S/p sentinel lymph node biopsy negative S/p lumpectomy Radiation 05/2018 Has had breast pain in past Now request to return to Oncology for follow-up       Other Visit Diagnoses    Closed fracture of right orbital floor, initial encounter [facility])    -  Primary   Relevant Orders   Ambulatory referral to ENT   Closed fracture of maxillary sinus, initial encounter [facility])       Relevant Orders   Ambulatory referral to ENT   Visit for suture removal          Improving overall after mechanical fall injury R eyebrow lac is healing well and approximated Unfortunately difficulty with small size of sutures and inadequate scissor equipment currently in our stock to remove these all successfully. Given the location, advised her to have sutures removed at urgent care or ED she prefers ED based on location and familiarity, called them to notify them. She will arrive today. For remaining 6 out of 7 suture removal R eyebrow  Referral to [place] [facility] for follow-up facial/maxillary orbital bone fractures.  Gave her contact info # for Dr Grayland Ormond to follow-up with  Oncology with breast cancer.  Orders Placed This Encounter  Procedures  . Ambulatory referral to ENT    Referral Priority:   Routine    Referral Type:   Consultation    Referral Reason:   Specialty Services Required    Requested Specialty:   Otolaryngology    Number of Visits Requested:   1     No orders of the defined types were placed in this encounter.     Follow up plan: Return if symptoms worsen or fail to improve.   [name], [facility] 06/26/2019, 11:58 AM

## 2019-06-26 NOTE — Discharge Instructions (Addendum)
Follow discharge care instruction. 

## 2019-06-26 NOTE — ED Provider Notes (Signed)
[facility] Emergency Department Provider Note  ____________________________________________   First MD Initiated Contact with Patient 06/26/19 1325     (approximate)  I have reviewed the triage vital signs and the nursing notes.   HISTORY  Chief Complaint Suture / Staple Removal    HPI Ellen [name] is a 86 y.o. female centrilobular by PCP for suture removal.  In the office tried to remove the sutures but were unsuccessful.  Patient sustained a laceration to the right supraorbital area 5 days ago.  Patient also sustained a fracture on the periorbital area.  Patient denies vision disturbance of pain at this time.         Past Medical History:  Diagnosis Date  . Arthritis   . Cancer ([place]) 01/2018   left breast   . Cataract   . Chronic headaches   . CKD (chronic kidney disease)   . Diverticulitis   . Family history of ovarian cancer   . History of syncope   . Hypertension   . Multinodular goiter   . Primary osteoarthritis of left knee   . Pulmonary hypertension [facility])     Patient Active Problem List   Diagnosis Date Noted  . Genetic testing 10/06/2018  . Family history of ovarian cancer   . Rotator cuff arthropathy of right shoulder 06/16/2018  . Rotator cuff arthropathy of left shoulder 03/13/2018  . Chronic pain of both shoulders 03/13/2018  . Primary cancer of upper outer quadrant of left female breast ([place]) 01/08/2018  . Type 2 diabetes mellitus with diabetic nephropathy, without long-term current use of insulin ([place]) 11/10/2017  . Chronic headaches 11/10/2017  . Hyperlipidemia associated with type 2 diabetes mellitus ([place]) 11/10/2017  . Essential hypertension 11/10/2017  . Enteritis 09/18/2017    Past Surgical History:  Procedure Laterality Date  . ABDOMINAL HYSTERECTOMY    . APPENDECTOMY    . BREAST BIOPSY Left 01/03/2018   left breast stereo/x clip/path pending  . BREAST LUMPECTOMY Left 01/12/2018  . BREAST LUMPECTOMY WITH  NEEDLE LOCALIZATION Left 01/12/2018   Procedure: BREAST LUMPECTOMY WITH NEEDLE LOCALIZATION;  Surgeon: Jules Husbands, MD;  Location: [facility] ORS;  Service: General;  Laterality: Left;  . CHOLECYSTECTOMY    . COLON SURGERY    . COLOSTOMY  2008  . COLOSTOMY REVERSAL  2008  . EYE SURGERY    . REPLACEMENT TOTAL KNEE Right 2018  . SENTINEL NODE BIOPSY Left 03/07/2018   Procedure: SENTINEL NODE BIOPSY;  Surgeon: Jules Husbands, MD;  Location: [facility] ORS;  Service: General;  Laterality: Left;    Prior to Admission medications   Medication Sig Start Date End Date Taking? Authorizing Provider  acetaminophen (TYLENOL) 500 MG tablet Take 1,000 mg by mouth every 6 (six) hours as needed for moderate pain or headache.     [provider]  amLODipine (NORVASC) 5 MG tablet TAKE 1 TABLET(5 MG) BY MOUTH DAILY 06/03/19   Parks Ranger, Devonne Doughty, DO  aspirin 81 MG chewable tablet Chew 81 mg by mouth daily.     [provider]  Blood Glucose Monitoring Suppl (ONE TOUCH ULTRA 2) w/Device KIT Use to check blood sugar daily, up to 2 times if need 06/16/18   [name], DO  cloNIDine (CATAPRES) 0.1 MG tablet TAKE 1 TABLET BY MOUTH DAILY AS NEEDED FOR ELEVATED BLOOD PRESSURE GREATER THAN 180/100/HEADACHE 04/23/19   Karamalegos, Devonne Doughty, DO  gabapentin (NEURONTIN) 300 MG capsule Take 1 capsule (300 mg total) by mouth in the  morning and at bedtime. 03/19/19   Karamalegos, Devonne Doughty, DO  Garlic 10 MG CAPS Take by mouth.    [provider]  Garlic 301 MG TABS Take by mouth.    [provider]  Lancets Glory Rosebush ULTRASOFT) lancets Use to check blood sugar daily, up to 2 times if need 06/16/18   [name], DO  lisinopril-hydrochlorothiazide (ZESTORETIC) 20-12.5 MG tablet TAKE 1 TABLET BY MOUTH DAILY 02/26/19   [name], DO  Multiple Vitamins-Minerals (CERTAVITE SENIOR/ANTIOXIDANT) TABS Take by mouth.    [provider]  ONE TOUCH ULTRA TEST  test strip Use to check blood sugar daily, up to 2 times if need 06/16/18   [name], DO  pravastatin (PRAVACHOL) 20 MG tablet TAKE 1 TABLET(20 MG) BY MOUTH AT BEDTIME 06/05/19   Karamalegos, Devonne Doughty, DO    Allergies Codeine and Morphine and related  Family History  Problem Relation Age of Onset  . Lung cancer Mother 62  . Diabetes Father   . Stroke Father   . Hypertension Maternal Grandmother   . Hypertension Maternal Uncle   . Hypertension Maternal Aunt   . Ovarian cancer Maternal Aunt 28  . Heart disease Daughter     Social History Social History   Tobacco Use  . Smoking status: Never Smoker  . Smokeless tobacco: Never Used  Substance Use Topics  . Alcohol use: Never  . Drug use: Never    Review of Systems Constitutional: No fever/chills Eyes: No visual changes. ENT: No sore throat. Cardiovascular: Denies chest pain. Respiratory: Denies shortness of breath. Gastrointestinal: No abdominal pain.  No nausea, no vomiting.  No diarrhea.  No constipation. Genitourinary: Negative for dysuria. Musculoskeletal: Negative for back pain. Skin: Negative for rash.  Status post facial laceration Neurological: Negative for headaches, focal weakness or numbness. Endocrine:  Diabetes, end-stage renal disease, hyperlipidemia, hypertension.  Allergic/Immunilogical: Codeine and morphine.  ____________________________________________   PHYSICAL EXAM:  VITAL SIGNS: ED Triage Vitals  Enc Vitals Group     BP 06/26/19 1321 (!) 173/54     Pulse Rate 06/26/19 1321 (!) 59     Resp 06/26/19 1321 18     Temp 06/26/19 1321 97.9 F (36.6 C)     Temp Source 06/26/19 1321 Oral     SpO2 06/26/19 1321 96 %     Weight 06/26/19 1259 146 lb (66.2 kg)     Height 06/26/19 1259 5' (1.524 m)     Head Circumference --      Peak Flow --      Pain Score 06/26/19 1259 0     Pain Loc --      Pain Edu? --      Excl. in [place]? --    Constitutional: Alert and oriented. Well appearing  and in no acute distress. Cardiovascular: Normal rate, regular rhythm. Grossly normal heart sounds.  Good peripheral circulation.  Elevated blood pressure. Respiratory: Normal respiratory effort.  No retractions. Lungs CTAB. Musculoskeletal: No lower extremity tenderness nor edema.  No joint effusions. Neurologic:  Normal speech and language. No gross focal neurologic deficits are appreciated. No gait instability. Skin:  Skin is warm, dry and intact. No rash noted.  Laceration right supraorbital area. Psychiatric: Mood and affect are normal. Speech and behavior are normal.  ____________________________________________   LABS (all labs ordered are listed, but only abnormal results are displayed)  Labs Reviewed - No data to display ____________________________________________  EKG   ____________________________________________  RADIOLOGY  ED MD interpretation:  Official radiology report(s): No results found.  ____________________________________________   PROCEDURES  Procedure(s) performed (including Critical Care):  .Suture Removal  Date/Time: 06/26/2019 1:50 PM Performed by: Sable Feil, PA-C Authorized by: Sable Feil, PA-C   Consent:    Consent obtained:  Verbal   Consent given by:  Patient   Risks discussed:  Bleeding, pain and wound separation Location:    Location:  Head/neck   Head/neck location:  Eyebrow   Eyebrow location:  R eyebrow Procedure details:    Wound appearance:  No signs of infection, good wound healing and clean   Number of sutures removed:  5 Post-procedure details:    Post-removal:  No dressing applied   Patient tolerance of procedure:  Tolerated well, no immediate complications     ____________________________________________   INITIAL IMPRESSION / ASSESSMENT AND PLAN / ED COURSE  As part of my medical decision making, I reviewed the following data within the [place]     Patient here for suture  removal.  See procedure note.  Patient given discharge care instruction advised follow-up PCP.    Ellen [name] was evaluated in Emergency Department on 06/26/2019 for the symptoms described in the history of present illness. She was evaluated in the context of the global COVID-19 pandemic, which necessitated consideration that the patient might be at risk for infection with the SARS-CoV-2 virus that causes COVID-19. Institutional protocols and algorithms that pertain to the evaluation of patients at risk for COVID-19 are in a state of rapid change based on information released by regulatory bodies including the CDC and federal and state organizations. These policies and algorithms were followed during the patient's care in the ED.       ____________________________________________   FINAL CLINICAL IMPRESSION(S) / ED DIAGNOSES  Final diagnoses:  Encounter for removal of sutures     ED Discharge Orders    None       Note:  This document was prepared using Dragon voice recognition software and may include unintentional dictation errors.    Sable Feil, PA-C 06/26/19 1355    Duffy Bruce, MD 06/26/19 1430

## 2019-06-26 NOTE — Patient Instructions (Addendum)
Thank you for coming to the office today.  Referral sent to ENT locally - stay tuned for apt. They should call you. If not heard back in 1 week, then you can call them or [place] as well.  [address]  [place], [place] [address] Ph: [phone]  [facility] ED - Removal of Sutures 6 out of 7 - we removed ONE suture. Only.  [facility] Address: [address], [place], [place] [address] Phone: [phone]   Call Dr Grayland Ormond to schedule follow-up no breast cancer.  [place] [facility] (Hematology/Oncology) [place], [place] [address] Phone: [phone]   Suture Removal, Care After This sheet gives you information about how to care for yourself after your procedure. Your health care provider may also give you more specific instructions. If you have problems or questions, contact your health care provider. What can I expect after the procedure? After your stitches (sutures) are removed, it is common to have:  Some discomfort and swelling in the area.  Slight redness in the area. Follow these instructions at home: If you have a bandage:  Wash your hands with soap and water before you change your bandage (dressing). If soap and water are not available, use hand sanitizer.  Change your dressing as told by your health care provider. If your dressing becomes wet or dirty, or develops a bad smell, change it as soon as possible.  If your dressing sticks to your skin, soak it in warm water to loosen it. Wound care   Check your wound every day for signs of infection. Check for: ? More redness, swelling, or pain. ? Fluid or blood. ? Warmth. ? Pus or a bad smell.  Wash your hands with soap and water before and after touching your wound.  Apply cream or ointment only as directed by your health care provider. If you are using cream or ointment, wash the area with soap and water 2  times a day to remove all the cream or ointment. Rinse off the soap and pat the area dry with a clean towel.  If you have skin glue or adhesive strips on your wound, leave these closures in place. They may need to stay in place for 2 weeks or longer. If adhesive strip edges start to loosen and curl up, you may trim the loose edges. Do not remove adhesive strips completely unless your health care provider tells you to do that.  Keep the wound area dry and clean. Do not take baths, swim, or use a hot tub until your health care provider approves.  Continue to protect the wound from injury.  Do not pick at your wound. Picking can cause an infection.  When your wound has completely healed, wear sunscreen over it or cover it with clothing when you are outside. New scars get sunburned easily, which can make scarring worse. General instructions  Take over-the-counter and prescription medicines only as told by your health care provider.  Keep all follow-up visits as told by your health care provider. This is important. Contact a health care provider if:  You have redness, swelling, or pain around your wound.  You have fluid or blood coming from your wound.  Your wound feels warm to the touch.  You have pus or a bad smell coming from your wound.  Your wound opens up. Get help right away if:  You have a fever.  You  have redness that is spreading from your wound. Summary  After your sutures are removed, it is common to have some discomfort and swelling in the area.  Wash your hands with soap and water before you change your bandage (dressing).  Keep the wound area dry and clean. Do not take baths, swim, or use a hot tub until your health care provider approves. This information is not intended to replace advice given to you by your health care provider. Make sure you discuss any questions you have with your health care provider. Document Revised: 12/31/2016 Document Reviewed:  02/24/2016 Elsevier Patient Education  2020 [employer].   Please schedule a Follow-up Appointment to: Return if symptoms worsen or fail to improve.  If you have any other questions or concerns, please feel free to call the office or send a message through [place]. You may also schedule an earlier appointment if necessary.  Additionally, you may be receiving a survey about your experience at our office within a few days to 1 week by e-mail or mail. We value your feedback.  [name], DO [place]

## 2019-06-26 NOTE — Assessment & Plan Note (Signed)
Followed by current team at [place] Dr Grayland Ormond and Gen Surgery Dr Dahlia Byes and Dr Baruch Gouty Rad-Onc S/p biopsy diagnosis S/p sentinel lymph node biopsy negative S/p lumpectomy Radiation 05/2018 Has had breast pain in past Now request to return to Oncology for follow-up

## 2019-08-09 ENCOUNTER — Telehealth: Payer: Self-pay

## 2019-08-09 ENCOUNTER — Ambulatory Visit: Payer: Self-pay | Admitting: General Practice

## 2019-08-09 NOTE — Chronic Care Management (AMB) (Signed)
  Chronic Care Management   Outreach Note  08/09/2019 Name: Frederick Klinger MRN: [id number] DOB: 25-Jan-1930  Referred by: [name], DO Reason for referral : Chronic Care Management (Follow up Call: 2nd attempt: Chronic Disease Management and Care Coordination Needs )   A second unsuccessful telephone outreach was attempted today. The patient was referred to the case management team for assistance with care management and care coordination.   Follow Up Plan: A HIPPA compliant phone message was left for the patient providing contact information and requesting a return call.   Noreene Larsson RN, MSN, [place] Mobile: [phone]

## 2019-08-10 ENCOUNTER — Other Ambulatory Visit: Payer: Self-pay

## 2019-08-10 ENCOUNTER — Ambulatory Visit (INDEPENDENT_AMBULATORY_CARE_PROVIDER_SITE_OTHER): Payer: Medicare Other | Admitting: Family Medicine

## 2019-08-10 ENCOUNTER — Encounter: Payer: Self-pay | Admitting: Family Medicine

## 2019-08-10 VITALS — BP 136/61 | HR 61 | Temp 97.7°F | Resp 16 | Ht 60.0 in | Wt 141.0 lb

## 2019-08-10 DIAGNOSIS — B029 Zoster without complications: Secondary | ICD-10-CM | POA: Diagnosis not present

## 2019-08-10 DIAGNOSIS — R21 Rash and other nonspecific skin eruption: Secondary | ICD-10-CM | POA: Diagnosis not present

## 2019-08-10 MED ORDER — ACYCLOVIR 800 MG PO TABS: 800.0000 mg | ORAL_TABLET | Freq: Every day | ORAL | 0 refills | Status: AC

## 2019-08-10 MED ORDER — TRIAMCINOLONE ACETONIDE 0.5 % EX CREA: 1.0000 "application " | TOPICAL_CREAM | Freq: Two times a day (BID) | CUTANEOUS | 0 refills | Status: DC

## 2019-08-10 NOTE — Patient Instructions (Addendum)
Thank you for coming to the office today.  Start the anti-viral medication - Acyclovir take one pill 5  times a day for 7 days to complete the entire course. This will help the current flare up heal, including the rash to dry up and resolve quicker. It may take several days to weeks for the rash to completely resolve. Typically it will dry up and scab off.  Blister and Rash Care  Take a cool bath or apply cool compresses to the area of the rash or blisters as directed by your health care provider. This may help with pain and itching.  Keep your rash covered with a loose bandage (dressing). Wear loose-fitting clothing to help ease the pain of material rubbing against the rash.  Keep your rash and blisters clean with mild soap and cool water or as directed by your health care provider.  Check your rash every day for signs of infection. These include redness, swelling, and pain that lasts or increases.  Do not pick your blisters.  Do not scratch your rash.  As mentioned, some patients experience "Post-Herpetic Neuralgia" or a persistent sensation or feeling of "nerve irritation or pain" that can last for days to weeks to months or longer in this same area.  You may get the Shingles vaccine (Shingrix) approximately 6 weeks after the rash and episode is resolved. It is a two dose series of vaccines. You may get this at the pharmacy, as Medicare does not cover this vaccine at our office.   Contact your doctor if:  Your pain is not relieved with prescribed medicines.  Your pain does not get better after the rash heals.  Your rash looks infected. Signs of infection include redness, swelling, and pain that lasts or increases.  Please seek more immediate medical attention at the [facility] Emergency [facility] IF  The rash is on your face or nose.  You have facial pain, pain around your eye area, or loss of feeling on one side of your face.  You have ear pain or you have ringing in your  ear.  You have loss of taste.  Your condition gets worse.    ------------------------------------------------------------------------------------------------------- Please review the additional information below about Shingles, if you have additional concerns.  Shingles Shingles, which is also known as herpes zoster, is an infection that causes a painful skin rash and fluid-filled blisters. Shingles is not related to genital herpes, which is a sexually transmitted infection. Shingles only develops in people who:  Have had chickenpox.  Have received the chickenpox vaccine. (This is rare.)  What are the causes? Shingles is caused by varicella-zoster virus (VZV). This is the same virus that causes chickenpox. After exposure to VZV, the virus stays in the body in an inactive (dormant) state. Shingles develops if the virus reactivates. This can happen many years after the initial exposure to VZV. It is not known what causes this virus to reactivate. What increases the risk? People who have had chickenpox or received the chickenpox vaccine are at risk for shingles. Infection is more common in people who:  Are older than age 81.  Have a weakened defense (immune) system, such as those with HIV, AIDS, or cancer.  Are taking medicines that weaken the immune system, such as transplant medicines.  Are under great stress.  What are the signs or symptoms? Early symptoms of this condition include itching, tingling, and pain in an area on your skin. Pain may be described as burning, stabbing, or throbbing. A few  days or weeks after symptoms start, a painful red rash appears, usually on one side of the body in a bandlike or beltlike pattern. The rash eventually turns into fluid-filled blisters that break open, scab over, and dry up in about 2-3 weeks. At any time during the infection, you may also develop:  A fever.  Chills.  A headache.  An upset stomach.  How is this diagnosed? This  condition is diagnosed with a skin exam. Sometimes, skin or fluid samples are taken from the blisters before a diagnosis is made. These samples are examined under a microscope or sent to a lab for testing. How is this treated? There is no specific cure for this condition. Your health care provider will probably prescribe medicines to help you manage pain, recover more quickly, and avoid long-term problems. Medicines may include:  Antiviral drugs.  Anti-inflammatory drugs.  Pain medicines.  If the area involved is on your face, you may be referred to a specialist, such as an eye doctor (ophthalmologist) or an ear, nose, and throat (ENT) doctor to help you avoid eye problems, chronic pain, or disability. Follow these instructions at home: Medicines  Take medicines only as directed by your health care provider.  Apply an anti-itch or numbing cream to the affected area as directed by your health care provider. General instructions  Rest as directed by your health care provider.  Keep all follow-up visits as directed by your health care provider. This is important.  Until your blisters scab over, your infection can cause chickenpox in people who have never had it or been vaccinated against it. To prevent this from happening, avoid contact with other people, especially: ? Babies. ? Pregnant women. ? Children who have eczema. ? Elderly people who have transplants. ? People who have chronic illnesses, such as leukemia or AIDS. This information is not intended to replace advice given to you by your health care provider. Make sure you discuss any questions you have with your health care provider. Document Released: 01/18/2005 Document Revised: 09/14/2015 Document Reviewed: 11/29/2013 Elsevier Interactive Patient Education  2018 [employer].     Please schedule a Follow-up Appointment to: Return in about 6 weeks (around 09/21/2019) for 6 week follow-up DM A1c, foot.  If you have any other  questions or concerns, please feel free to call the office or send a message through [place]. You may also schedule an earlier appointment if necessary.  Additionally, you may be receiving a survey about your experience at our office within a few days to 1 week by e-mail or mail. We value your feedback.  [name], DO [place]

## 2019-08-10 NOTE — Progress Notes (Signed)
Subjective:    Patient ID: Ellen [name], female    DOB: [birth year], 86 y.o.   MRN: [id number]  Ellen [name] is a 86 y.o. female presenting on 08/10/2019 for Rash (onset day--right thigh area)   HPI   ACUTE SHINGLES / RASH Reports new onset problem 1 day ago developed itching and then woke up today with rash R inner thigh, red spots with some early vesicle/blistering without any open skin sores. She scratched it. No bleeding no drainage. No pain. No other areas of rash. History of chicken pox. Never received shingles vaccine. Tried topical cortisone some relief Denies spreading redness fever chills  Depression screen [facility] 2/9 04/23/2019 09/21/2018 04/12/2018  Decreased Interest 0 0 0  Down, Depressed, Hopeless 0 0 0  PHQ - 2 Score 0 0 0    Social History   Tobacco Use  . Smoking status: Never Smoker  . Smokeless tobacco: Never Used  Vaping Use  . Vaping Use: Never used  Substance Use Topics  . Alcohol use: Never  . Drug use: Never    Review of Systems Per HPI unless specifically indicated above     Objective:    BP 136/61   Pulse 61   Temp 97.7 F (36.5 C) (Temporal)   Resp 16   Ht 5' (1.524 m)   Wt 141 lb (64 kg)   SpO2 98%   BMI 27.54 kg/m   Wt Readings from Last 3 Encounters:  08/10/19 141 lb (64 kg)  06/26/19 146 lb (66.2 kg)  06/26/19 146 lb 9.6 oz (66.5 kg)    Physical Exam Vitals and nursing note reviewed.  Constitutional:      General: She is not in acute distress.    Appearance: She is well-developed. She is not diaphoretic.     Comments: Well-appearing, comfortable, cooperative  HENT:     Head: Normocephalic and atraumatic.  Eyes:     General:        Right eye: No discharge.        Left eye: No discharge.     Conjunctiva/sclera: Conjunctivae normal.  Cardiovascular:     Rate and Rhythm: Normal rate.  Pulmonary:     Effort: Pulmonary effort is normal.  Skin:    General: Skin is warm and dry.     Findings: Rash (R inner thigh patchy  erythematous vesicular rash early stages appears in dermatomal pattern) present. No erythema.  Neurological:     Mental Status: She is alert and oriented to person, place, and time.  Psychiatric:        Behavior: Behavior normal.     Comments: Well groomed, good eye contact, normal speech and thoughts    Results for orders placed or performed in visit on 06/16/18  POCT glycosylated hemoglobin (Hb A1C)  Result Value Ref Range   Hemoglobin A1C 6.1 (A) 4.0 - 5.6 %      Assessment & Plan:   Problem List Items Addressed This Visit    None    Visit Diagnoses    Herpes zoster without complication    -  Primary   Relevant Medications   acyclovir (ZOVIRAX) 800 MG tablet   Rash and nonspecific skin eruption       Relevant Medications   triamcinolone cream (KENALOG) 0.5 %      Clinically consistent with new acute shingles herpes zoster outbreak onset 1-2 days ago, with mild itching symptoms, apparent blistering formation in localized dermatomal pattern Right inner thigh - Never received zoster  vaccine - No evidence of complications, such as ocular involvement  Plan: 1. Start Acyclovir 800mg  5 x daily x 7 days, counseled on treatment course and side effects 2. Discussed potential course of post-herpetic neuralgia symptoms -  - Add rx Triamcinolone cream 0.5% PRN 3. Counseling on infectious nature of problem with avoiding close contact with high risk populations 4. Follow-up as needed within 4-6 weeks if not resolving or if residual pain, strict return criteria if facial or ocular involvement or other acute concerns  Future can get Shingrix vaccine >6 weeks after shingles has resolved.   Meds ordered this encounter  Medications  . triamcinolone cream (KENALOG) 0.5 %    Sig: Apply 1 application topically 2 (two) times daily. To affected areas, for up to 2 weeks.    Dispense:  30 g    Refill:  0  . acyclovir (ZOVIRAX) 800 MG tablet    Sig: Take 1 tablet (800 mg total) by mouth 5  (five) times daily for 7 days.    Dispense:  35 tablet    Refill:  0      Follow up plan: Return in about 6 weeks (around 09/21/2019) for 6 week follow-up DM A1c, foot.   [name], [facility] 08/10/2019, 4:41 PM

## 2019-08-11 ENCOUNTER — Encounter: Payer: Self-pay | Admitting: Family Medicine

## 2019-08-13 ENCOUNTER — Other Ambulatory Visit: Payer: Self-pay

## 2019-08-13 ENCOUNTER — Emergency Department: Payer: Medicare Other

## 2019-08-13 ENCOUNTER — Emergency Department
Admission: EM | Admit: 2019-08-13 | Discharge: 2019-08-13 | Disposition: A | Discharge status: Home / Self Care | Payer: Medicare Other | Attending: Emergency Medicine | Admitting: Emergency Medicine

## 2019-08-13 ENCOUNTER — Encounter: Payer: Self-pay | Admitting: Emergency Medicine

## 2019-08-13 DIAGNOSIS — Z79899 Other long term (current) drug therapy: Secondary | ICD-10-CM | POA: Diagnosis not present

## 2019-08-13 DIAGNOSIS — I129 Hypertensive chronic kidney disease with stage 1 through stage 4 chronic kidney disease, or unspecified chronic kidney disease: Secondary | ICD-10-CM | POA: Insufficient documentation

## 2019-08-13 DIAGNOSIS — Z853 Personal history of malignant neoplasm of breast: Secondary | ICD-10-CM | POA: Insufficient documentation

## 2019-08-13 DIAGNOSIS — R109 Unspecified abdominal pain: Secondary | ICD-10-CM | POA: Diagnosis not present

## 2019-08-13 DIAGNOSIS — R1032 Left lower quadrant pain: Secondary | ICD-10-CM | POA: Diagnosis not present

## 2019-08-13 DIAGNOSIS — R1033 Periumbilical pain: Secondary | ICD-10-CM | POA: Diagnosis not present

## 2019-08-13 DIAGNOSIS — Z7982 Long term (current) use of aspirin: Secondary | ICD-10-CM | POA: Insufficient documentation

## 2019-08-13 DIAGNOSIS — Z9049 Acquired absence of other specified parts of digestive tract: Secondary | ICD-10-CM | POA: Diagnosis not present

## 2019-08-13 DIAGNOSIS — R1084 Generalized abdominal pain: Secondary | ICD-10-CM | POA: Diagnosis not present

## 2019-08-13 DIAGNOSIS — E1121 Type 2 diabetes mellitus with diabetic nephropathy: Secondary | ICD-10-CM | POA: Diagnosis not present

## 2019-08-13 DIAGNOSIS — N189 Chronic kidney disease, unspecified: Secondary | ICD-10-CM | POA: Insufficient documentation

## 2019-08-13 DIAGNOSIS — I700 Atherosclerosis of aorta: Secondary | ICD-10-CM | POA: Diagnosis not present

## 2019-08-13 DIAGNOSIS — Z743 Need for continuous supervision: Secondary | ICD-10-CM | POA: Diagnosis not present

## 2019-08-13 DIAGNOSIS — R1030 Lower abdominal pain, unspecified: Secondary | ICD-10-CM | POA: Diagnosis present

## 2019-08-13 LAB — COMPREHENSIVE METABOLIC PANEL
ALT: 10 U/L (ref 0–44)
AST: 22 U/L (ref 15–41)
Albumin: 3.5 g/dL (ref 3.5–5.0)
Alkaline Phosphatase: 58 U/L (ref 38–126)
Anion gap: 9 (ref 5–15)
BUN: 16 mg/dL (ref 8–23)
CO2: 26 mmol/L (ref 22–32)
Calcium: 9.2 mg/dL (ref 8.9–10.3)
Chloride: 103 mmol/L (ref 98–111)
Creatinine, Ser: 1.1 mg/dL — ABNORMAL HIGH (ref 0.44–1.00)
GFR calc Af Amer: 52 mL/min — ABNORMAL LOW (ref >60)
GFR calc non Af Amer: 45 mL/min — ABNORMAL LOW (ref >60)
Glucose, Bld: 95 mg/dL (ref 70–99)
Potassium: 3.8 mmol/L (ref 3.5–5.1)
Sodium: 138 mmol/L (ref 135–145)
Total Bilirubin: 0.6 mg/dL (ref 0.3–1.2)
Total Protein: 8.2 g/dL — ABNORMAL HIGH (ref 6.5–8.1)

## 2019-08-13 LAB — URINALYSIS, COMPLETE (UACMP) WITH MICROSCOPIC
Bacteria, UA: NONE SEEN
Bilirubin Urine: NEGATIVE
Glucose, UA: NEGATIVE mg/dL
Hgb urine dipstick: NEGATIVE
Ketones, ur: NEGATIVE mg/dL
Leukocytes,Ua: NEGATIVE
Nitrite: NEGATIVE
Protein, ur: NEGATIVE mg/dL
Specific Gravity, Urine: 1.011 (ref 1.005–1.030)
pH: 5 (ref 5.0–8.0)

## 2019-08-13 LAB — CBC
HCT: 35 % — ABNORMAL LOW (ref 36.0–46.0)
Hemoglobin: 11.2 g/dL — ABNORMAL LOW (ref 12.0–15.0)
MCH: 27 pg (ref 26.0–34.0)
MCHC: 32 g/dL (ref 30.0–36.0)
MCV: 84.3 fL (ref 80.0–100.0)
Platelets: 263 10*3/uL (ref 150–400)
RBC: 4.15 MIL/uL (ref 3.87–5.11)
RDW: 16.5 % — ABNORMAL HIGH (ref 11.5–15.5)
WBC: 5.4 10*3/uL (ref 4.0–10.5)
nRBC: 0 % (ref 0.0–0.2)

## 2019-08-13 LAB — LIPASE, BLOOD: Lipase: 34 U/L (ref 11–51)

## 2019-08-13 MED ORDER — DICYCLOMINE HCL 10 MG PO CAPS: 10.0000 mg | ORAL_CAPSULE | Freq: Three times a day (TID) | ORAL | 0 refills | Status: DC | PRN

## 2019-08-13 MED ORDER — ONDANSETRON HCL 4 MG/2ML IJ SOLN
4.0000 mg | Freq: Once | INTRAMUSCULAR | Status: AC
  Administered 2019-08-13: 4 mg via INTRAVENOUS
  Filled 2019-08-13: qty 2

## 2019-08-13 MED ORDER — SODIUM CHLORIDE 0.9 % IV BOLUS
500.0000 mL | Freq: Once | INTRAVENOUS | Status: AC
  Administered 2019-08-13: 500 mL via INTRAVENOUS

## 2019-08-13 MED ORDER — FENTANYL CITRATE (PF) 100 MCG/2ML IJ SOLN
25.0000 ug | Freq: Once | INTRAMUSCULAR | Status: AC
  Administered 2019-08-13: 25 ug via INTRAVENOUS
  Filled 2019-08-13: qty 2

## 2019-08-13 MED ORDER — IOHEXOL 300 MG/ML  SOLN
100.0000 mL | Freq: Once | INTRAMUSCULAR | Status: AC | PRN
  Administered 2019-08-13: 100 mL via INTRAVENOUS

## 2019-08-13 NOTE — ED Notes (Signed)
Pt ambulated back to bed at this time, patient hooked back up to monitor and has no further needs at this time. Will continue to monitor.

## 2019-08-13 NOTE — ED Triage Notes (Signed)
Patient from home via [facility]. Patient reports abdominal pain that radiates to back that started yesterday afternoon. Reports diarrhea and burning with urination. Denies N/V.

## 2019-08-13 NOTE — ED Notes (Signed)
Pt ate food in tray and tolerated well, patient denies pain or nausea post eating.

## 2019-08-13 NOTE — ED Notes (Signed)
Pt given [place] sandwich tray and sprite at this time to eat.

## 2019-08-13 NOTE — ED Provider Notes (Signed)
[facility] Emergency Department Provider Note  ____________________________________________   First MD Initiated Contact with Patient 08/13/19 [phone]     (approximate)  I have reviewed the triage vital signs and the nursing notes.   HISTORY  Chief Complaint Abdominal Pain    HPI Ellen [name] is a 86 y.o. female with history of hypertension, CKD, recurrent abdominal pain, here with abdominal pain.  Patient states her symptoms started yesterday.  Began fairly gradually.  She states that she has had some mild lower abdominal cramping, as well as what she describes as a cramping like sensation with urination.  Denies any fevers or chills.  No nausea or vomiting.  She has a history of enteritis/colitis with similar symptoms.  No recent medication changes, suspicious food intake, or known sick contacts.  Denies any hematuria or foul-smelling urine.  Symptoms are fairly constant, worse with eating.  No alleviating factors.        Past Medical History:  Diagnosis Date  . Arthritis   . Cancer ([place]) 01/2018   left breast   . Cataract   . Chronic headaches   . CKD (chronic kidney disease)   . Diverticulitis   . Family history of ovarian cancer   . History of syncope   . Hypertension   . Multinodular goiter   . Primary osteoarthritis of left knee   . Pulmonary hypertension [facility])     Patient Active Problem List   Diagnosis Date Noted  . Genetic testing 10/06/2018  . Family history of ovarian cancer   . Rotator cuff arthropathy of right shoulder 06/16/2018  . Rotator cuff arthropathy of left shoulder 03/13/2018  . Chronic pain of both shoulders 03/13/2018  . Primary cancer of upper outer quadrant of left female breast ([place]) 01/08/2018  . Type 2 diabetes mellitus with diabetic nephropathy, without long-term current use of insulin ([place]) 11/10/2017  . Chronic headaches 11/10/2017  . Hyperlipidemia associated with type 2 diabetes mellitus ([place]) 11/10/2017   . Essential hypertension 11/10/2017  . Enteritis 09/18/2017    Past Surgical History:  Procedure Laterality Date  . ABDOMINAL HYSTERECTOMY    . APPENDECTOMY    . BREAST BIOPSY Left 01/03/2018   left breast stereo/x clip/path pending  . BREAST LUMPECTOMY Left 01/12/2018  . BREAST LUMPECTOMY WITH NEEDLE LOCALIZATION Left 01/12/2018   Procedure: BREAST LUMPECTOMY WITH NEEDLE LOCALIZATION;  Surgeon: Jules Husbands, MD;  Location: [facility] ORS;  Service: General;  Laterality: Left;  . CHOLECYSTECTOMY    . COLON SURGERY    . COLOSTOMY  2008  . COLOSTOMY REVERSAL  2008  . EYE SURGERY    . REPLACEMENT TOTAL KNEE Right 2018  . SENTINEL NODE BIOPSY Left 03/07/2018   Procedure: SENTINEL NODE BIOPSY;  Surgeon: Jules Husbands, MD;  Location: [facility] ORS;  Service: General;  Laterality: Left;    Prior to Admission medications   Medication Sig Start Date End Date Taking? Authorizing Provider  acetaminophen (TYLENOL) 500 MG tablet Take 1,000 mg by mouth every 6 (six) hours as needed for moderate pain or headache.     [provider]  acyclovir (ZOVIRAX) 800 MG tablet Take 1 tablet (800 mg total) by mouth 5 (five) times daily for 7 days. 08/10/19 08/17/19  Karamalegos, Devonne Doughty, DO  amLODipine (NORVASC) 5 MG tablet TAKE 1 TABLET(5 MG) BY MOUTH DAILY 06/03/19   Parks Ranger, Devonne Doughty, DO  aspirin 81 MG chewable tablet Chew 81 mg by mouth daily.     [provider]  Blood Glucose Monitoring Suppl (ONE TOUCH ULTRA 2) w/Device KIT Use to check blood sugar daily, up to 2 times if need 06/16/18   [name], DO  cloNIDine (CATAPRES) 0.1 MG tablet TAKE 1 TABLET BY MOUTH DAILY AS NEEDED FOR ELEVATED BLOOD PRESSURE GREATER THAN 180/100/HEADACHE 04/23/19   Karamalegos, Devonne Doughty, DO  dicyclomine (BENTYL) 10 MG capsule Take 1 capsule (10 mg total) by mouth 3 (three) times daily as needed for up to 5 days for spasms (abdominal cramping). 08/13/19 08/18/19  Duffy Bruce, MD  gabapentin  (NEURONTIN) 300 MG capsule Take 1 capsule (300 mg total) by mouth in the morning and at bedtime. 03/19/19   Karamalegos, Devonne Doughty, DO  Garlic 10 MG CAPS Take by mouth.    [provider]  Garlic 161 MG TABS Take by mouth.    [provider]  Lancets Glory Rosebush ULTRASOFT) lancets Use to check blood sugar daily, up to 2 times if need 06/16/18   [name], DO  lisinopril-hydrochlorothiazide (ZESTORETIC) 20-12.5 MG tablet TAKE 1 TABLET BY MOUTH DAILY 02/26/19   [name], DO  Multiple Vitamins-Minerals (CERTAVITE SENIOR/ANTIOXIDANT) TABS Take by mouth.    [provider]  ONE TOUCH ULTRA TEST test strip Use to check blood sugar daily, up to 2 times if need 06/16/18   [name], DO  pravastatin (PRAVACHOL) 20 MG tablet TAKE 1 TABLET(20 MG) BY MOUTH AT BEDTIME 06/05/19   Karamalegos, Devonne Doughty, DO  triamcinolone cream (KENALOG) 0.5 % Apply 1 application topically 2 (two) times daily. To affected areas, for up to 2 weeks. 08/10/19   Karamalegos, Devonne Doughty, DO    Allergies Codeine and Morphine and related  Family History  Problem Relation Age of Onset  . Lung cancer Mother 8  . Diabetes Father   . Stroke Father   . Hypertension Maternal Grandmother   . Hypertension Maternal Uncle   . Hypertension Maternal Aunt   . Ovarian cancer Maternal Aunt 15  . Heart disease Daughter     Social History Social History   Tobacco Use  . Smoking status: Never Smoker  . Smokeless tobacco: Never Used  Vaping Use  . Vaping Use: Never used  Substance Use Topics  . Alcohol use: Never  . Drug use: Never    Review of Systems  Review of Systems  Constitutional: Positive for fatigue. Negative for fever.  HENT: Negative for congestion and sore throat.   Eyes: Negative for visual disturbance.  Respiratory: Negative for cough and shortness of breath.   Cardiovascular: Negative for chest pain.  Gastrointestinal: Positive for abdominal  pain and nausea. Negative for diarrhea and vomiting.  Genitourinary: Negative for flank pain.  Musculoskeletal: Negative for back pain and neck pain.  Skin: Negative for rash and wound.  Neurological: Positive for weakness.  All other systems reviewed and are negative.    ____________________________________________  PHYSICAL EXAM:      VITAL SIGNS: ED Triage Vitals [08/13/19 0920]  Enc Vitals Group     BP (!) 166/70     Pulse Rate 62     Resp 14     Temp 97.9 F (36.6 C)     Temp Source Oral     SpO2 99 %     Weight 141 lb (64 kg)     Height 5' (1.524 m)     Head Circumference      Peak Flow      Pain Score 9     Pain  Loc      Pain Edu?      Excl. in [place]?      Physical Exam Vitals and nursing note reviewed.  Constitutional:      General: She is not in acute distress.    Appearance: She is well-developed.  HENT:     Head: Normocephalic and atraumatic.  Eyes:     Conjunctiva/sclera: Conjunctivae normal.  Cardiovascular:     Rate and Rhythm: Normal rate and regular rhythm.     Heart sounds: Normal heart sounds. No murmur heard.  No friction rub.  Pulmonary:     Effort: Pulmonary effort is normal. No respiratory distress.     Breath sounds: Normal breath sounds. No wheezing or rales.  Abdominal:     General: There is no distension.     Palpations: Abdomen is soft.     Tenderness: There is abdominal tenderness in the periumbilical area and suprapubic area.  Musculoskeletal:     Cervical back: Neck supple.  Skin:    General: Skin is warm.     Capillary Refill: Capillary refill takes less than 2 seconds.     Findings: No rash.  Neurological:     Mental Status: She is alert and oriented to person, place, and time.     Motor: No abnormal muscle tone.       ____________________________________________   LABS (all labs ordered are listed, but only abnormal results are displayed)  Labs Reviewed  COMPREHENSIVE METABOLIC PANEL - Abnormal; Notable for the  following components:      Result Value   Creatinine, Ser 1.10 (*)    Total Protein 8.2 (*)    GFR calc non Af Amer 45 (*)    GFR calc Af Amer 52 (*)    All other components within normal limits  CBC - Abnormal; Notable for the following components:   Hemoglobin 11.2 (*)    HCT 35.0 (*)    RDW 16.5 (*)    All other components within normal limits  URINALYSIS, COMPLETE (UACMP) WITH MICROSCOPIC - Abnormal; Notable for the following components:   Color, Urine YELLOW (*)    APPearance CLEAR (*)    All other components within normal limits  LIPASE, BLOOD    ____________________________________________  EKG:  ________________________________________  RADIOLOGY All imaging, including plain films, CT scans, and ultrasounds, independently reviewed by me, and interpretations confirmed via formal radiology reads.  ED MD interpretation:   CT abdomen/pelvis: No acute abnormality  Official radiology report(s): CT ABDOMEN PELVIS W CONTRAST  Result Date: 08/13/2019 CLINICAL DATA:  Acute generalized abdominal pain. EXAM: CT ABDOMEN AND PELVIS WITH CONTRAST TECHNIQUE: Multidetector CT imaging of the abdomen and pelvis was performed using the standard protocol following bolus administration of intravenous contrast. CONTRAST:  145m OMNIPAQUE IOHEXOL 300 MG/ML  SOLN COMPARISON:  April 09, 2018. FINDINGS: Lower chest: No acute abnormality. Hepatobiliary: No focal liver abnormality is seen. Status post cholecystectomy. No biliary dilatation. Pancreas: Unremarkable. No pancreatic ductal dilatation or surrounding inflammatory changes. Spleen: Status post splenectomy. Adrenals/Urinary Tract: Adrenal glands are unremarkable. No hydronephrosis or renal obstruction is noted. Bladder is unremarkable. Stable 9 mm partially exophytic left renal lesion is noted in the interpolar region. Stomach/Bowel: The stomach appears normal. There is no evidence of bowel obstruction or inflammation. Status post appendectomy.  Vascular/Lymphatic: Aortic atherosclerosis. No enlarged abdominal or pelvic lymph nodes. Reproductive: Status post hysterectomy. No adnexal masses. Other: No abdominal wall hernia or abnormality. No abdominopelvic ascites. Musculoskeletal: No acute or significant osseous findings.  IMPRESSION: No acute abnormality seen in the abdomen or pelvis. Aortic Atherosclerosis (ICD10-I70.0). Electronically Signed   By: Marijo Conception M.D.   On: 08/13/2019 10:41    ____________________________________________  PROCEDURES   Procedure(s) performed (including Critical Care):  Procedures  ____________________________________________  INITIAL IMPRESSION / MDM / [place] / ED COURSE  As part of my medical decision making, I reviewed the following data within the [place] notes reviewed and incorporated, Old chart reviewed, Notes from prior ED visits, and [place] Controlled Substance Database       *Ellen [name] was evaluated in Emergency Department on 08/13/2019 for the symptoms described in the history of present illness. She was evaluated in the context of the global COVID-19 pandemic, which necessitated consideration that the patient might be at risk for infection with the SARS-CoV-2 virus that causes COVID-19. Institutional protocols and algorithms that pertain to the evaluation of patients at risk for COVID-19 are in a state of rapid change based on information released by regulatory bodies including the CDC and federal and state organizations. These policies and algorithms were followed during the patient's care in the ED.  Some ED evaluations and interventions may be delayed as a result of limited staffing during the pandemic.*     Medical Decision Making: 87 year old female here with left lower/generalized abdominal pain.  History of similar episodes.  On arrival, the patient is very well-appearing and in no distress.  She is afebrile and hemodynamically stable.   Screening lab work obtained and reviewed.  Patient has no significant leukocytosis, with normal renal function, normal LFTs, and no evidence of acute abnormality.  She is tolerating p.o.  CT scan obtained shows no evidence of colitis, diverticulitis, appendicitis, obstruction, or other abnormality.  She feels markedly improved after fluids and a single dose of analgesia in the ED.  She does have a history of similar episodes, raising question of IBS. Feels much better and is tolerating PO. Will discharge with outpatient follow-up.  ____________________________________________  FINAL CLINICAL IMPRESSION(S) / ED DIAGNOSES  Final diagnoses:  Abdominal pain, unspecified abdominal location     MEDICATIONS GIVEN DURING THIS VISIT:  Medications  sodium chloride 0.9 % bolus 500 mL (0 mLs Intravenous Stopped 08/13/19 1119)  fentaNYL (SUBLIMAZE) injection 25 mcg (25 mcg Intravenous Given 08/13/19 1041)  ondansetron (ZOFRAN) injection 4 mg (4 mg Intravenous Given 08/13/19 1039)  iohexol (OMNIPAQUE) 300 MG/ML solution 100 mL (100 mLs Intravenous Contrast Given 08/13/19 1020)     ED Discharge Orders         Ordered    dicyclomine (BENTYL) 10 MG capsule  3 times daily PRN     Discontinue  Reprint     08/13/19 1257           Note:  This document was prepared using Dragon voice recognition software and may include unintentional dictation errors.   Duffy Bruce, MD 08/13/19 1524

## 2019-08-13 NOTE — ED Notes (Signed)
Pt ambulated to the bedside toilet at this time with this RN observing. Patient tolerated well, no assistance needed.

## 2019-08-13 NOTE — Discharge Instructions (Signed)
Take the Bentyl as needed for abdominal pain and cramping

## 2019-08-14 ENCOUNTER — Ambulatory Visit: Payer: Medicare Other

## 2019-08-14 ENCOUNTER — Telehealth: Payer: Self-pay

## 2019-08-14 NOTE — Telephone Encounter (Signed)
This nurse called patient in order to perform scheduled AWV. Patient stated that she had to go to the hospital yesterday and does not feel up to doing the visit. States she will call to reschedule for another time.

## 2019-08-23 ENCOUNTER — Other Ambulatory Visit: Payer: Self-pay | Admitting: Family Medicine

## 2019-08-23 DIAGNOSIS — G8929 Other chronic pain: Secondary | ICD-10-CM

## 2019-09-04 ENCOUNTER — Telehealth: Payer: Self-pay

## 2019-09-04 NOTE — Telephone Encounter (Signed)
Copied from [place] [id number]. Topic: General - Other >> Sep 04, 2019  2:47 PM Celene Kras wrote: Reason for CRM: Pt called stating that she has shingles. She states that she has an appt with the neurologist in the morning, but that she cannot remember the name of the office. Please advise.

## 2019-09-04 NOTE — Telephone Encounter (Signed)
Spoke to the patient she was diagnosed with Shingles, and did not want to go for Neurologist appointment, she has appointment with Dr Melrose Nakayama tomorrow around 8:30 am the number given to the patient so she can call and cancel the appointment.

## 2019-09-06 ENCOUNTER — Telehealth: Payer: Self-pay

## 2019-09-06 ENCOUNTER — Telehealth: Payer: Self-pay | Admitting: General Practice

## 2019-09-06 NOTE — Telephone Encounter (Signed)
  Chronic Care Management   Outreach Note  09/06/2019 Name: Ellen [name] MRN: [id number] DOB: [birth year]  Referred by: [name], DO Reason for referral : Appointment (RNCM Follow up appointment- 3rd attempt for Chronic Case Management and Care coordination Needs: rescheduled for 8/9)   An unsuccessful telephone outreach was attempted today. The patient was referred to the case management team for assistance with care management and care coordination. This was the third attempt. The patient answered the phone; [place] The patient ask for a call back and agreed for the [facility] to call back on Monday. Rescheduled the patients appointment for Monday.   Follow Up Plan: Telephone follow up appointment with care management team member scheduled for: 09-10-2019 at 900 am  Noreene Larsson RN, MSN, [place] Mobile: [phone]

## 2019-09-10 ENCOUNTER — Ambulatory Visit (INDEPENDENT_AMBULATORY_CARE_PROVIDER_SITE_OTHER): Payer: Medicare Other | Admitting: General Practice

## 2019-09-10 ENCOUNTER — Telehealth: Payer: Self-pay | Admitting: General Practice

## 2019-09-10 DIAGNOSIS — I10 Essential (primary) hypertension: Secondary | ICD-10-CM

## 2019-09-10 DIAGNOSIS — E1121 Type 2 diabetes mellitus with diabetic nephropathy: Secondary | ICD-10-CM

## 2019-09-10 DIAGNOSIS — E1169 Type 2 diabetes mellitus with other specified complication: Secondary | ICD-10-CM

## 2019-09-10 DIAGNOSIS — G8929 Other chronic pain: Secondary | ICD-10-CM

## 2019-09-10 DIAGNOSIS — E785 Hyperlipidemia, unspecified: Secondary | ICD-10-CM | POA: Diagnosis not present

## 2019-09-10 NOTE — Patient Instructions (Signed)
Visit Information  Goals Addressed              This Visit's Progress     RNCM: Pt- "I take my blood pressure sometimes" (pt-stated)        CARE PLAN ENTRY (see longtitudinal plan of care for additional care plan information)  Current Barriers:   Chronic Disease Management support, education, and care coordination needs related to HTN, HLD, DMII, and Chronic headache pain   Limited social support  Clinical Goal(s) related to HTN, HLD, DMII, and Chronic headache pain :  Over the next 120 days, patient will:   Work with the care management team to address educational, disease management, and care coordination needs   Begin or continue self health monitoring activities as directed today Measure and record cbg (blood glucose) 2 times a week or as instructed by her pcp, Measure and record blood pressure 5 times per week, and adhere to a Heart healthy Diet  Call provider office for new or worsened signs and symptoms Blood glucose findings outside established parameters, Blood pressure findings outside established parameters, Shortness of breath, and New or worsened symptom related to HLD or pain unresolved with chronic headaches and discomfort.   Call care management team with questions or concerns  Verbalize basic understanding of patient centered plan of care established today  Interventions related to HTN, HLD, DMII, and Chronic headache pain :   Evaluation of current treatment plans and patient's adherence to plan as established by provider. 09-10-2019: The patient says since they have adjusted her medications she is not having  headaches. She is doing well.    Assessed patient understanding of disease states- the patient says she does not take anything for blood sugar elevation. She does not check her blood sugars often. She says she knows when it gets elevated because of the way she feels. Education on asking the provider how often she should take her blood sugar readings and  record. Education on hyperglycemia and hypoglycemia. 09-10-2019: The patient says her blood sugars vary. Sometimes it is 87, 90, and 120.  She knows the sx/sx hyperglycemia and hypoglycemia.  Denies any issues at this time.   Assessed patient's education and care coordination needs- the patient takes her blood pressure readings and records. 09-10-2019: The patient says her blood pressure is consistently 140/70.  Denies any episodes of hypertension at this time.   Provided disease specific education to patient- education on the benefits of heart healthy diet.  The patient tries to watch her sodium. Education on low sodium options. The patient loves to fish and eats a lot of fish. The patient educated on diet high in sodium can cause issues with blood pressure, kidneys, and eyes. 09-10-2019: Review of heart healthy/ADA diet.  The patient is eating vegetables and fruits from her garden.   The patient tries to watch what she is eating but sometimes does not. Encouraged the patient to monitor dietary intake and avoid foods high is fats, salt, and sugars.   Evaluation of activity level. 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 The patient has a son who is currently out of the country and a daughter that lives about 72 miles from her. She goes often to her daughters house. She says she is very independent. Has someone that helps her with the yard sometimes. Review of the care guides being able to assist with finding help with resources  in the area. The patient will let the RNCM know if she needs an assistance in the future.   Collaborated with appropriate clinical care team members regarding patient needs- the patient denies any concerns for LCSW and pharmacist.   Evaluation of upcoming appointments: Sees the pcp on 09-24-2019 at 1:20 pm.    Patient Self Care Activities related to CAD, HLD, DMII, and Chronic headache pain :   Patient is  unable to independently self-manage chronic health conditions  Please see past updates related to this goal by clicking on the "Past Updates" button in the selected goal         Patient verbalizes understanding of instructions provided today.   Telephone follow up appointment with care management team member scheduled for: 11-12-2019 at 0900  [place], MSN, [place] Mobile: [phone]

## 2019-09-10 NOTE — Chronic Care Management (AMB) (Signed)
Chronic Care Management   Follow Up Note   09/10/2019 Name: Ellen [name] MRN: [id number] DOB: [birth year]  Referred by: [name], DO Reason for referral : Chronic Care Management (RNCM: Chronic Disease Management and Care Coordination Needs)   Ellen [name] is a 86 y.o. year old female who is a primary care patient of [name], DO. The CCM team was consulted for assistance with chronic disease management and care coordination needs.    Review of patient status, including review of consultants reports, relevant laboratory and other test results, and collaboration with appropriate care team members and the patient's provider was performed as part of comprehensive patient evaluation and provision of chronic care management services.    SDOH (Social Determinants of Health) assessments performed: Yes See Care Plan activities for detailed interventions related to [facility])     Outpatient Encounter Medications as of 09/10/2019  Medication Sig  . acetaminophen (TYLENOL) 500 MG tablet Take 1,000 mg by mouth every 6 (six) hours as needed for moderate pain or headache.   Marland Kitchen amLODipine (NORVASC) 5 MG tablet TAKE 1 TABLET(5 MG) BY MOUTH DAILY  . aspirin 81 MG chewable tablet Chew 81 mg by mouth daily.   . Blood Glucose Monitoring Suppl (ONE TOUCH ULTRA 2) w/Device KIT Use to check blood sugar daily, up to 2 times if need  . cloNIDine (CATAPRES) 0.1 MG tablet TAKE 1 TABLET BY MOUTH DAILY AS NEEDED FOR ELEVATED BLOOD PRESSURE GREATER THAN 180/100/HEADACHE  . dicyclomine (BENTYL) 10 MG capsule Take 1 capsule (10 mg total) by mouth 3 (three) times daily as needed for up to 5 days for spasms (abdominal cramping).  . gabapentin (NEURONTIN) 300 MG capsule TAKE 1 CAPSULE(300 MG) BY MOUTH IN THE MORNING AND AT BEDTIME  . Garlic 10 MG CAPS Take by mouth.  . Garlic 128 MG TABS Take by mouth.  . Lancets (ONETOUCH ULTRASOFT) lancets Use to check blood sugar daily, up to 2 times if need   . lisinopril-hydrochlorothiazide (ZESTORETIC) 20-12.5 MG tablet TAKE 1 TABLET BY MOUTH DAILY  . Multiple Vitamins-Minerals (CERTAVITE SENIOR/ANTIOXIDANT) TABS Take by mouth.  . ONE TOUCH ULTRA TEST test strip Use to check blood sugar daily, up to 2 times if need  . pravastatin (PRAVACHOL) 20 MG tablet TAKE 1 TABLET(20 MG) BY MOUTH AT BEDTIME  . triamcinolone cream (KENALOG) 0.5 % Apply 1 application topically 2 (two) times daily. To affected areas, for up to 2 weeks.   No facility-administered encounter medications on file as of 09/10/2019.     Objective:  BP Readings from Last 3 Encounters:  08/13/19 (!) 186/76  08/10/19 136/61  06/26/19 (!) 173/54    Goals Addressed              This Visit's Progress   .  RNCM: Pt- "I take my blood pressure sometimes" (pt-stated)        CARE PLAN ENTRY (see longtitudinal plan of care for additional care plan information)  Current Barriers:  . Chronic Disease Management support, education, and care coordination needs related to HTN, HLD, DMII, and Chronic headache pain  . Limited social support  Clinical Goal(s) related to HTN, HLD, DMII, and Chronic headache pain :  Over the next 120 days, patient will:  . Work with the care management team to address educational, disease management, and care coordination needs  . Begin or continue self health monitoring activities as directed today Measure and record cbg (blood glucose) 2 times a week or as instructed by  her pcp, Measure and record blood pressure 5 times per week, and adhere to a Heart healthy Diet . Call provider office for new or worsened signs and symptoms Blood glucose findings outside established parameters, Blood pressure findings outside established parameters, Shortness of breath, and New or worsened symptom related to HLD or pain unresolved with chronic headaches and discomfort.  . Call care management team with questions or concerns . Verbalize basic understanding of patient centered  plan of care established today  Interventions related to HTN, HLD, DMII, and Chronic headache pain :  . Evaluation of current treatment plans and patient's adherence to plan as established by provider. 09-10-2019: The patient says since they have adjusted her medications she is not having  headaches. She is doing well.   . Assessed patient understanding of disease states- the patient says she does not take anything for blood sugar elevation. She does not check her blood sugars often. She says she knows when it gets elevated because of the way she feels. Education on asking the provider how often she should take her blood sugar readings and record. Education on hyperglycemia and hypoglycemia. 09-10-2019: The patient says her blood sugars vary. Sometimes it is 87, 90, and 120.  She knows the sx/sx hyperglycemia and hypoglycemia.  Denies any issues at this time.  . Assessed patient's education and care coordination needs- the patient takes her blood pressure readings and records. 09-10-2019: The patient says her blood pressure is consistently 140/70.  Denies any episodes of hypertension at this time.  . Provided disease specific education to patient- education on the benefits of heart healthy diet.  The patient tries to watch her sodium. Education on low sodium options. The patient loves to fish and eats a lot of fish. The patient educated on diet high in sodium can cause issues with blood pressure, kidneys, and eyes. 09-10-2019: Review of heart healthy/ADA diet.  The patient is eating vegetables and fruits from her garden.   The patient tries to watch what she is eating but sometimes does not. Encouraged the patient to monitor dietary intake and avoid foods high is fats, salt, and sugars.  . Evaluation of activity level. 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 The patient has a son who is currently out of the country and a daughter that  lives about 34 miles from her. She goes often to her daughters house. She says she is very independent. Has someone that helps her with the yard sometimes. Review of the care guides being able to assist with finding help with resources in the area. The patient will let the RNCM know if she needs an assistance in the future.  Nash Dimmer with appropriate clinical care team members regarding patient needs- the patient denies any concerns for LCSW and pharmacist.  . Evaluation of upcoming appointments: Sees the pcp on 09-24-2019 at 1:20 pm.    Patient Self Care Activities related to CAD, HLD, DMII, and Chronic headache pain :  . Patient is unable to independently self-manage chronic health conditions  Please see past updates related to this goal by clicking on the "Past Updates" button in the selected goal          Plan:   Telephone follow up appointment with care management team member scheduled for: 11-12-2019 at 0900   Grace, MSN, [place]  [facility] Mobile: [phone]

## 2019-09-21 ENCOUNTER — Ambulatory Visit: Payer: Medicare Other | Admitting: Family Medicine

## 2019-09-24 ENCOUNTER — Ambulatory Visit (INDEPENDENT_AMBULATORY_CARE_PROVIDER_SITE_OTHER): Payer: Medicare Other | Admitting: Family Medicine

## 2019-09-24 ENCOUNTER — Encounter: Payer: Self-pay | Admitting: Family Medicine

## 2019-09-24 ENCOUNTER — Other Ambulatory Visit: Payer: Self-pay

## 2019-09-24 VITALS — BP 141/52 | HR 64 | Temp 97.7°F | Resp 16 | Ht 60.0 in | Wt 143.0 lb

## 2019-09-24 DIAGNOSIS — C50412 Malignant neoplasm of upper-outer quadrant of left female breast: Secondary | ICD-10-CM

## 2019-09-24 DIAGNOSIS — E1121 Type 2 diabetes mellitus with diabetic nephropathy: Secondary | ICD-10-CM

## 2019-09-24 LAB — POCT GLYCOSYLATED HEMOGLOBIN (HGB A1C): Hemoglobin A1C: 5.8 % — AB (ref 4.0–5.6)

## 2019-09-24 NOTE — Assessment & Plan Note (Signed)
Well-controlled DM with A1c 5.8 today Remains off of medication Complications - CKD-III, other including hyperlipidemia - increases risk of future cardiovascular complications  OFF Metformin for years  Plan:  1. Remain off therapy - not indicated - now still diet controlled 2. Encourage improved lifestyle - low carb, low sugar diet, reduce portion size, continue improving regular exercise 3. May Check CBG  4. Continue ASA, ACEi, Statin 5. Call [place] to schedule DM eye visit 6. DM Foot exam today Follow-up

## 2019-09-24 NOTE — Progress Notes (Signed)
Subjective:    Patient ID: Ellen [name], female    DOB: [birth year], 86 y.o.   MRN: [id number]  Ellen [name] is a 86 y.o. female presenting on 09/24/2019 for Diabetes and Breast Mass (left side--- felt some lump where she had Breast lumpectomy)   HPI   CHRONIC DM, Type 2: Last visit 01/2018. She used to be on Metformin >4 years ago but was taken off of this. Last A1c 6 range, now due today Meds:None Currently on ACEi Lifestyle: - Diet (balanced diet, low carb diet)  - Exercise (limited, but does walking) Due for DM Eye Exam - previously referred to [facility] Denies hypoglycemia, polyuria, visual changes, numbness or tingling.  Follow-up Left Breast Cancer, invasive ductal carcinoma Followed by Dr Grayland Ormond [facility] Oncology, last see 11/2018, she completed adjuvant XRT but declined chemotherapy and letrozole - Now today reports L breast pain around nipple and surgical site and more "lumps" in the area of her prior surgery Denies discharge   Depression screen [facility] 2/9 04/23/2019 09/21/2018 04/12/2018  Decreased Interest 0 0 0  Down, Depressed, Hopeless 0 0 0  PHQ - 2 Score 0 0 0    Social History   Tobacco Use  . Smoking status: Never Smoker  . Smokeless tobacco: Never Used  Vaping Use  . Vaping Use: Never used  Substance Use Topics  . Alcohol use: Never  . Drug use: Never    Review of Systems Per HPI unless specifically indicated above     Objective:    BP (!) 141/52   Pulse 64   Temp 97.7 F (36.5 C) (Temporal)   Resp 16   Ht 5' (1.524 m)   Wt 143 lb (64.9 kg)   SpO2 100%   BMI 27.93 kg/m   Wt Readings from Last 3 Encounters:  09/24/19 143 lb (64.9 kg)  08/13/19 141 lb (64 kg)  08/10/19 141 lb (64 kg)    Physical Exam Vitals and nursing note reviewed.  Constitutional:      General: She is not in acute distress.    Appearance: She is well-developed. She is not diaphoretic.     Comments: Well-appearing, comfortable, cooperative  HENT:     Head:  Normocephalic and atraumatic.  Eyes:     General:        Right eye: No discharge.        Left eye: No discharge.     Conjunctiva/sclera: Conjunctivae normal.  Cardiovascular:     Rate and Rhythm: Normal rate.  Pulmonary:     Effort: Pulmonary effort is normal.  Chest:     Comments: Declined breast exam today Skin:    General: Skin is warm and dry.     Findings: No erythema or rash.  Neurological:     Mental Status: She is alert and oriented to person, place, and time.  Psychiatric:        Behavior: Behavior normal.     Comments: Well groomed, good eye contact, normal speech and thoughts     Diabetic Foot Exam - Simple   Simple Foot Form Diabetic Foot exam was performed with the following findings: Yes 09/24/2019  1:43 PM  Visual Inspection See comments: Yes Sensation Testing Intact to touch and monofilament testing bilaterally: Yes Pulse Check Posterior Tibialis and Dorsalis pulse intact bilaterally: Yes Comments Toes bilateral with some deformity of position with some overlap. No ulceration. No callus     Results for orders placed or performed in visit on 09/24/19  POCT HgB A1C  Result Value Ref Range   Hemoglobin A1C 5.8 (A) 4.0 - 5.6 %      Assessment & Plan:   Problem List Items Addressed This Visit    Type 2 diabetes mellitus with diabetic nephropathy, without long-term current use of insulin (HCC) - Primary    Well-controlled DM with A1c 5.8 today Remains off of medication Complications - CKD-III, other including hyperlipidemia - increases risk of future cardiovascular complications  OFF Metformin for years  Plan:  1. Remain off therapy - not indicated - now still diet controlled 2. Encourage improved lifestyle - low carb, low sugar diet, reduce portion size, continue improving regular exercise 3. May Check CBG  4. Continue ASA, ACEi, Statin 5. Call [place] to schedule DM eye visit 6. DM Foot exam today Follow-up      Relevant Orders   POCT HgB  A1C (Completed)   Primary cancer of upper outer quadrant of left female breast [facility])    Today with recurrent Left breast and nipple pain recently  Followed by current team at [place] Dr Grayland Ormond and Gen Surgery Dr Dahlia Byes and Dr Baruch Gouty Rad-Onc S/p biopsy diagnosis S/p sentinel lymph node biopsy negative S/p lumpectomy Radiation 05/2018 Has had breast pain in past  Now request to return to Oncology for follow-up         No orders of the defined types were placed in this encounter.    Follow up plan: Return in about 6 months (around 03/26/2020) for 6 month follow-up DM A1c.  CC'd chart to Dr Grayland Ormond with request for his office to contact patient and schedule closer follow up on the Left breast complaints now, I also asked patient to call the Oncology office to follow-up  [name], [facility] 09/24/2019, 1:39 PM

## 2019-09-24 NOTE — Assessment & Plan Note (Signed)
Today with recurrent Left breast and nipple pain recently  Followed by current team at [place] Dr Grayland Ormond and Gen Surgery Dr Dahlia Byes and Dr Baruch Gouty Rad-Onc S/p biopsy diagnosis S/p sentinel lymph node biopsy negative S/p lumpectomy Radiation 05/2018 Has had breast pain in past  Now request to return to Oncology for follow-up

## 2019-09-24 NOTE — Patient Instructions (Addendum)
Thank you for coming to the office today.  Go ahead and contact Dr Gary Fleet office to schedule follow-up and notify them that you are having Left breast pain and nodularity.  [place] at [facility] (Hematology/Oncology) [address], [place] [address] Phone: [phone]  -----------------------  Please re-schedule your Diabetic Eye Appointment.  [facility] [address], [place], [place] [address] Phone: [phone] [email or web address]  Recent Labs    09/24/19 1352  HGBA1C 5.8*        Please schedule a Follow-up Appointment to: Return in about 6 months (around 03/26/2020) for 6 month follow-up DM A1c.  If you have any other questions or concerns, please feel free to call the office or send a message through [place]. You may also schedule an earlier appointment if necessary.  Additionally, you may be receiving a survey about your experience at our office within a few days to 1 week by e-mail or mail. We value your feedback.  [name], DO [place]

## 2019-09-27 ENCOUNTER — Ambulatory Visit: Payer: Self-pay | Admitting: Family Medicine

## 2019-09-27 ENCOUNTER — Encounter: Payer: Self-pay | Admitting: Emergency Medicine

## 2019-09-27 ENCOUNTER — Emergency Department
Admission: EM | Admit: 2019-09-27 | Discharge: 2019-09-27 | Disposition: A | Discharge status: Home / Self Care | Payer: Medicare Other | Attending: Emergency Medicine | Admitting: Emergency Medicine

## 2019-09-27 ENCOUNTER — Other Ambulatory Visit: Payer: Self-pay

## 2019-09-27 ENCOUNTER — Emergency Department: Payer: Medicare Other

## 2019-09-27 DIAGNOSIS — E1121 Type 2 diabetes mellitus with diabetic nephropathy: Secondary | ICD-10-CM | POA: Diagnosis not present

## 2019-09-27 DIAGNOSIS — Z96651 Presence of right artificial knee joint: Secondary | ICD-10-CM | POA: Diagnosis not present

## 2019-09-27 DIAGNOSIS — Z79899 Other long term (current) drug therapy: Secondary | ICD-10-CM | POA: Diagnosis not present

## 2019-09-27 DIAGNOSIS — E1122 Type 2 diabetes mellitus with diabetic chronic kidney disease: Secondary | ICD-10-CM | POA: Diagnosis not present

## 2019-09-27 DIAGNOSIS — Z7982 Long term (current) use of aspirin: Secondary | ICD-10-CM | POA: Diagnosis not present

## 2019-09-27 DIAGNOSIS — I129 Hypertensive chronic kidney disease with stage 1 through stage 4 chronic kidney disease, or unspecified chronic kidney disease: Secondary | ICD-10-CM | POA: Diagnosis not present

## 2019-09-27 DIAGNOSIS — M47816 Spondylosis without myelopathy or radiculopathy, lumbar region: Secondary | ICD-10-CM | POA: Diagnosis not present

## 2019-09-27 DIAGNOSIS — N390 Urinary tract infection, site not specified: Secondary | ICD-10-CM | POA: Diagnosis not present

## 2019-09-27 DIAGNOSIS — R1012 Left upper quadrant pain: Secondary | ICD-10-CM | POA: Diagnosis not present

## 2019-09-27 DIAGNOSIS — I700 Atherosclerosis of aorta: Secondary | ICD-10-CM | POA: Diagnosis not present

## 2019-09-27 DIAGNOSIS — R109 Unspecified abdominal pain: Secondary | ICD-10-CM | POA: Diagnosis not present

## 2019-09-27 DIAGNOSIS — W19XXXA Unspecified fall, initial encounter: Secondary | ICD-10-CM | POA: Diagnosis not present

## 2019-09-27 DIAGNOSIS — I10 Essential (primary) hypertension: Secondary | ICD-10-CM | POA: Diagnosis not present

## 2019-09-27 DIAGNOSIS — Z743 Need for continuous supervision: Secondary | ICD-10-CM | POA: Diagnosis not present

## 2019-09-27 DIAGNOSIS — R1030 Lower abdominal pain, unspecified: Secondary | ICD-10-CM

## 2019-09-27 DIAGNOSIS — Z9049 Acquired absence of other specified parts of digestive tract: Secondary | ICD-10-CM | POA: Diagnosis not present

## 2019-09-27 DIAGNOSIS — N189 Chronic kidney disease, unspecified: Secondary | ICD-10-CM | POA: Insufficient documentation

## 2019-09-27 DIAGNOSIS — R6889 Other general symptoms and signs: Secondary | ICD-10-CM | POA: Diagnosis not present

## 2019-09-27 DIAGNOSIS — R1032 Left lower quadrant pain: Secondary | ICD-10-CM | POA: Diagnosis not present

## 2019-09-27 DIAGNOSIS — R0902 Hypoxemia: Secondary | ICD-10-CM | POA: Diagnosis not present

## 2019-09-27 LAB — URINALYSIS, COMPLETE (UACMP) WITH MICROSCOPIC
Bacteria, UA: NONE SEEN
Bilirubin Urine: NEGATIVE
Glucose, UA: NEGATIVE mg/dL
Hgb urine dipstick: NEGATIVE
Ketones, ur: NEGATIVE mg/dL
Leukocytes,Ua: NEGATIVE
Nitrite: NEGATIVE
Protein, ur: 100 mg/dL — AB
Specific Gravity, Urine: 1.023 (ref 1.005–1.030)
pH: 5 (ref 5.0–8.0)

## 2019-09-27 LAB — COMPREHENSIVE METABOLIC PANEL
ALT: 9 U/L (ref 0–44)
AST: 20 U/L (ref 15–41)
Albumin: 3.4 g/dL — ABNORMAL LOW (ref 3.5–5.0)
Alkaline Phosphatase: 47 U/L (ref 38–126)
Anion gap: 11 (ref 5–15)
BUN: 15 mg/dL (ref 8–23)
CO2: 26 mmol/L (ref 22–32)
Calcium: 9.3 mg/dL (ref 8.9–10.3)
Chloride: 104 mmol/L (ref 98–111)
Creatinine, Ser: 1.05 mg/dL — ABNORMAL HIGH (ref 0.44–1.00)
GFR calc Af Amer: 55 mL/min — ABNORMAL LOW (ref >60)
GFR calc non Af Amer: 48 mL/min — ABNORMAL LOW (ref >60)
Glucose, Bld: 95 mg/dL (ref 70–99)
Potassium: 3.8 mmol/L (ref 3.5–5.1)
Sodium: 141 mmol/L (ref 135–145)
Total Bilirubin: 0.9 mg/dL (ref 0.3–1.2)
Total Protein: 8 g/dL (ref 6.5–8.1)

## 2019-09-27 LAB — CBC
HCT: 33 % — ABNORMAL LOW (ref 36.0–46.0)
Hemoglobin: 10.7 g/dL — ABNORMAL LOW (ref 12.0–15.0)
MCH: 27.6 pg (ref 26.0–34.0)
MCHC: 32.4 g/dL (ref 30.0–36.0)
MCV: 85.1 fL (ref 80.0–100.0)
Platelets: 255 10*3/uL (ref 150–400)
RBC: 3.88 MIL/uL (ref 3.87–5.11)
RDW: 15.9 % — ABNORMAL HIGH (ref 11.5–15.5)
WBC: 5.3 10*3/uL (ref 4.0–10.5)
nRBC: 0 % (ref 0.0–0.2)

## 2019-09-27 LAB — LIPASE, BLOOD: Lipase: 31 U/L (ref 11–51)

## 2019-09-27 MED ORDER — IOHEXOL 300 MG/ML  SOLN
100.0000 mL | Freq: Once | INTRAMUSCULAR | Status: AC | PRN
  Administered 2019-09-27: 100 mL via INTRAVENOUS
  Filled 2019-09-27: qty 100

## 2019-09-27 MED ORDER — CEPHALEXIN 500 MG PO CAPS: 500.0000 mg | ORAL_CAPSULE | Freq: Two times a day (BID) | ORAL | 0 refills | Status: DC

## 2019-09-27 NOTE — ED Provider Notes (Signed)
[facility] Emergency Department Provider Note   ____________________________________________    I have reviewed the triage vital signs and the nursing notes.   HISTORY  Chief Complaint Abdominal Pain     HPI Ellen [name] is a 86 y.o. female with history as noted below who presents with complaints of left lower quadrant abdominal pain.  Patient describes pain started yesterday and has gradually worsened overnight.  She also reports dysuria and some urinary frequency.  She is concerned because she has a history of a colostomy from  ruptured diverticulitis.  She denies fevers or chills.  She has not take anything for this pain.  No radiation of pain.  Past Medical History:  Diagnosis Date  . Arthritis   . Cancer ([place]) 01/2018   left breast   . Cataract   . Chronic headaches   . CKD (chronic kidney disease)   . Diverticulitis   . Family history of ovarian cancer   . History of syncope   . Hypertension   . Multinodular goiter   . Primary osteoarthritis of left knee   . Pulmonary hypertension [facility])     Patient Active Problem List   Diagnosis Date Noted  . Genetic testing 10/06/2018  . Family history of ovarian cancer   . Rotator cuff arthropathy of right shoulder 06/16/2018  . Rotator cuff arthropathy of left shoulder 03/13/2018  . Chronic pain of both shoulders 03/13/2018  . Primary cancer of upper outer quadrant of left female breast ([place]) 01/08/2018  . Type 2 diabetes mellitus with diabetic nephropathy, without long-term current use of insulin ([place]) 11/10/2017  . Chronic headaches 11/10/2017  . Hyperlipidemia associated with type 2 diabetes mellitus ([place]) 11/10/2017  . Essential hypertension 11/10/2017  . Enteritis 09/18/2017    Past Surgical History:  Procedure Laterality Date  . ABDOMINAL HYSTERECTOMY    . APPENDECTOMY    . BREAST BIOPSY Left 01/03/2018   left breast stereo/x clip/path pending  . BREAST LUMPECTOMY Left  01/12/2018  . BREAST LUMPECTOMY WITH NEEDLE LOCALIZATION Left 01/12/2018   Procedure: BREAST LUMPECTOMY WITH NEEDLE LOCALIZATION;  Surgeon: Jules Husbands, MD;  Location: [facility] ORS;  Service: General;  Laterality: Left;  . CHOLECYSTECTOMY    . COLON SURGERY    . COLOSTOMY  2008  . COLOSTOMY REVERSAL  2008  . EYE SURGERY    . REPLACEMENT TOTAL KNEE Right 2018  . SENTINEL NODE BIOPSY Left 03/07/2018   Procedure: SENTINEL NODE BIOPSY;  Surgeon: Jules Husbands, MD;  Location: [facility] ORS;  Service: General;  Laterality: Left;    Prior to Admission medications   Medication Sig Start Date End Date Taking? Authorizing Provider  acetaminophen (TYLENOL) 500 MG tablet Take 1,000 mg by mouth every 6 (six) hours as needed for moderate pain or headache.     [provider]  amLODipine (NORVASC) 5 MG tablet TAKE 1 TABLET(5 MG) BY MOUTH DAILY 06/03/19   Parks Ranger, Devonne Doughty, DO  aspirin 81 MG chewable tablet Chew 81 mg by mouth daily.     [provider]  Blood Glucose Monitoring Suppl (ONE TOUCH ULTRA 2) w/Device KIT Use to check blood sugar daily, up to 2 times if need 06/16/18   Parks Ranger, Devonne Doughty, DO  cephALEXin (KEFLEX) 500 MG capsule Take 1 capsule (500 mg total) by mouth 2 (two) times daily. 09/27/19   Lavonia Drafts, MD  cloNIDine (CATAPRES) 0.1 MG tablet TAKE 1 TABLET BY MOUTH DAILY AS NEEDED FOR ELEVATED BLOOD PRESSURE GREATER  THAN 180/100/HEADACHE 04/23/19   Karamalegos, Devonne Doughty, DO  dicyclomine (BENTYL) 10 MG capsule Take 1 capsule (10 mg total) by mouth 3 (three) times daily as needed for up to 5 days for spasms (abdominal cramping). 08/13/19 08/18/19  Duffy Bruce, MD  gabapentin (NEURONTIN) 300 MG capsule TAKE 1 CAPSULE(300 MG) BY MOUTH IN THE MORNING AND AT BEDTIME 08/23/19   Karamalegos, Devonne Doughty, DO  Garlic 10 MG CAPS Take by mouth.    [provider]  Garlic 892 MG TABS Take by mouth.    [provider]  Lancets Glory Rosebush ULTRASOFT) lancets Use  to check blood sugar daily, up to 2 times if need 06/16/18   [name], DO  lisinopril-hydrochlorothiazide (ZESTORETIC) 20-12.5 MG tablet TAKE 1 TABLET BY MOUTH DAILY 02/26/19   [name], DO  Multiple Vitamins-Minerals (CERTAVITE SENIOR/ANTIOXIDANT) TABS Take by mouth.    [provider]  ONE TOUCH ULTRA TEST test strip Use to check blood sugar daily, up to 2 times if need 06/16/18   [name], DO  pravastatin (PRAVACHOL) 20 MG tablet TAKE 1 TABLET(20 MG) BY MOUTH AT BEDTIME 06/05/19   Karamalegos, Devonne Doughty, DO  triamcinolone cream (KENALOG) 0.5 % Apply 1 application topically 2 (two) times daily. To affected areas, for up to 2 weeks. 08/10/19   Karamalegos, Devonne Doughty, DO     Allergies Codeine and Morphine and related  Family History  Problem Relation Age of Onset  . Lung cancer Mother 86  . Diabetes Father   . Stroke Father   . Hypertension Maternal Grandmother   . Hypertension Maternal Uncle   . Hypertension Maternal Aunt   . Ovarian cancer Maternal Aunt 39  . Heart disease Daughter     Social History Social History   Tobacco Use  . Smoking status: Never Smoker  . Smokeless tobacco: Never Used  Vaping Use  . Vaping Use: Never used  Substance Use Topics  . Alcohol use: Never  . Drug use: Never    Review of Systems  Constitutional: No fever/chills Eyes: No visual changes.  ENT: No sore throat. Cardiovascular: Denies chest pain. Respiratory: Denies shortness of breath. Gastrointestinal: As above Genitourinary: As above Musculoskeletal: Negative for back pain. Skin: Negative for rash. Neurological: Negative for headaches or weakness   ____________________________________________   PHYSICAL EXAM:  VITAL SIGNS: ED Triage Vitals  Enc Vitals Group     BP 09/27/19 1234 (!) 162/74     Pulse Rate 09/27/19 1234 (!) 57     Resp 09/27/19 1234 14     Temp 09/27/19 1234 98.2 F (36.8 C)     Temp Source 09/27/19 1234  Oral     SpO2 09/27/19 1234 98 %     Weight 09/27/19 1235 64 kg (141 lb)     Height 09/27/19 1235 1.524 m (5')     Head Circumference --      Peak Flow --      Pain Score 09/27/19 1235 10     Pain Loc --      Pain Edu? --      Excl. in [place]? --     Constitutional: Alert and oriented. No acute distress.   Nose: No congestion/rhinnorhea. Mouth/Throat: Mucous membranes are moist.    Cardiovascular: Normal rate, regular rhythm.  Good peripheral circulation. Respiratory: Normal respiratory effort.  No retractions. Lungs CTAB. Gastrointestinal: Mild tenderness palpation the left lower quadrant.  No distention.  No CVA tenderness.  Musculoskeletal: No lower extremity tenderness  nor edema.  Warm and well perfused Neurologic:  Normal speech and language. No gross focal neurologic deficits are appreciated.  Skin:  Skin is warm, dry and intact. No rash noted. Psychiatric: Mood and affect are normal. Speech and behavior are normal.  ____________________________________________   LABS (all labs ordered are listed, but only abnormal results are displayed)  Labs Reviewed  COMPREHENSIVE METABOLIC PANEL - Abnormal; Notable for the following components:      Result Value   Creatinine, Ser 1.05 (*)    Albumin 3.4 (*)    GFR calc non Af Amer 48 (*)    GFR calc Af Amer 55 (*)    All other components within normal limits  CBC - Abnormal; Notable for the following components:   Hemoglobin 10.7 (*)    HCT 33.0 (*)    RDW 15.9 (*)    All other components within normal limits  URINALYSIS, COMPLETE (UACMP) WITH MICROSCOPIC - Abnormal; Notable for the following components:   Color, Urine YELLOW (*)    APPearance HAZY (*)    Protein, ur 100 (*)    All other components within normal limits  LIPASE, BLOOD   ____________________________________________  EKG  ED ECG REPORT I, Lavonia Drafts, the attending physician, personally viewed and interpreted this ECG.  Date: 09/27/2019  Rhythm: normal  sinus rhythm QRS Axis: normal Intervals: normal ST/T Wave abnormalities: normal Narrative Interpretation: no evidence of acute ischemia  ____________________________________________  RADIOLOGY  CT abdomen pelvis without acute abnormality ____________________________________________   PROCEDURES  Procedure(s) performed: No  Procedures   Critical Care performed: No ____________________________________________   INITIAL IMPRESSION / ASSESSMENT AND PLAN / ED COURSE  Pertinent labs & imaging results that were available during my care of the patient were reviewed by me and considered in my medical decision making (see chart for details).  Patient overall well-appearing and in no acute distress.  Presents with complaints of primarily left lower quadrant abdominal pain.  She denies shortness of breath to me.  Mild tenderness on exam, lab work is notable for normal white blood cell count.  Overall normal CMP and normal lipase  Differential includes diverticulitis given her history, urinary tract infection, ureterolithiasis.  Sent for CT scan to rule out diverticulitis.  Skin is quite reassuring, will start patient on p.o. antibiotics, outpatient follow-up recommended, return precautions of any worsening.     ____________________________________________   FINAL CLINICAL IMPRESSION(S) / ED DIAGNOSES  Final diagnoses:  Lower abdominal pain  Lower urinary tract infectious disease        Note:  This document was prepared using Dragon voice recognition software and may include unintentional dictation errors.   Lavonia Drafts, MD 09/27/19 [phone]

## 2019-09-27 NOTE — Telephone Encounter (Signed)
Pt reports dysuria for "Over a week." States gradually worsening. Reports 10/10 pain left flank area and bladder area, constant. Pain with urination, incontinence and "Not going very much, just like drops since Monday." Pain at bladder with palpation. States "Can barely walk or bend over with the pain." Also reports subjective fever, "Woke up in a sweat last night." Saw Dr. Parks Ranger Monday , did not report symptoms.  Pt sounds distressed.  Advised ED, advised driver to transport, pt states will call EMS as "Too much pain to drive and no one to take me."   Care advise given, pt verbalizes understanding.   Reason for Disposition  [1] Unable to urinate (or only a few drops) > 4 hours AND [2] bladder feels very full (e.g., palpable bladder or strong urge to urinate)  Answer Assessment - Initial Assessment Questions 1. SYMPTOM: "What's the main symptom you're concerned about?" (e.g., frequency, incontinence)     Pain with urination and at bladder area, not emptying bladder 2. ONSET: "When did the  *No Answer*  start?"     Over a week ago 3. PAIN: "Is there any pain?" If Yes, ask: "How bad is it?" (Scale: 1-10; mild, moderate, severe)     10/10 4. CAUSE: "What do you think is causing the symptoms?"     "Infection or blockage" 5. OTHER SYMPTOMS: "Do you have any other symptoms?" (e.g., fever, flank pain, blood in urine, pain with urination)     Left sided flank pain  Protocols used: URINARY [facility]

## 2019-09-27 NOTE — ED Notes (Signed)
E-signature not working at this time. Pt verbalized understanding of D/C instructions, prescriptions and follow up care with no further questions at this time. Pt in NAD and ambulatory at time of D/C.  

## 2019-09-27 NOTE — ED Triage Notes (Signed)
Patient presents to the ED via [facility] with left sided upper abdominal pain from home. Patient states on Monday, she was assisting her son-in-law and he fell and she attempted to stop the fall and she had a sharp tearing pain in her left upper abdomen that has not resolved.  Patient also reports new difficulty urinating and having bowel movements.

## 2019-09-29 NOTE — Progress Notes (Signed)
  [place]  Telephone:(336) [phone] Fax:(336) [phone]  ID: Ellen [name] OB: 1929/07/04  MR#: [id number]  HAL#:[id number]  Patient Care Team: [name], DO as PCP - General (Family Medicine) Vanita Ingles, RN as Case Manager (General Practice)    Lloyd Huger, MD   10/04/2019 8:52 AM     This encounter was created in error - please disregard.

## 2019-10-02 ENCOUNTER — Encounter: Payer: Self-pay | Admitting: Oncology

## 2019-10-03 ENCOUNTER — Inpatient Hospital Stay: Payer: Medicare Other | Admitting: Oncology

## 2019-10-06 NOTE — Progress Notes (Signed)
[place]  Telephone:([phone]) [phone] Fax:(336) [phone]  ID: Ellen [name] OB: Mar 23, 1929  MR#: [id number]  OYD#:[id number]  Patient Care Team: [name], DO as PCP - General (Family Medicine) Vanita Ingles, RN as Case Manager (General Practice)   CHIEF COMPLAINT: Stage Ia ER/PR positive, HER-2 negative invasive carcinoma of the left upper outer quadrant breast.   INTERVAL HISTORY: Patient last evaluated in clinic in October 2020. She missed several appointments in the interim, but returns with complaints of chronic left breast pain. She admits this is unchanged from previous. She has no neurologic complaints.  She denies any recent fevers or illnesses.  She has a good appetite and denies weight loss.  She denies any chest pain, shortness of breath, cough, or hemoptysis.  She denies any nausea, vomiting, constipation, or diarrhea.  She has no urinary complaints. Patient offers no further specific complaints today.  REVIEW OF SYSTEMS:   Review of Systems  Constitutional: Negative.  Negative for fever, malaise/fatigue and weight loss.  Respiratory: Negative.  Negative for cough, hemoptysis and shortness of breath.   Cardiovascular: Negative.  Negative for chest pain and leg swelling.  Gastrointestinal: Negative.  Negative for abdominal pain, blood in stool and melena.  Genitourinary: Negative.  Negative for dysuria.  Musculoskeletal: Negative.  Negative for back pain.  Skin: Negative.  Negative for rash.  Neurological: Negative.  Negative for focal weakness, weakness and headaches.  Psychiatric/Behavioral: Negative.  The patient is not nervous/anxious.     As per HPI. Otherwise, a complete review of systems is negative.  PAST MEDICAL HISTORY: Past Medical History:  Diagnosis Date  . Arthritis   . Cancer ([place]) 01/2018   left breast   . Cataract   . Chronic headaches   . CKD (chronic kidney disease)   . Diverticulitis   . Family history of  ovarian cancer   . History of syncope   . Hypertension   . Multinodular goiter   . Primary osteoarthritis of left knee   . Pulmonary hypertension ([place])     PAST SURGICAL HISTORY: Past Surgical History:  Procedure Laterality Date  . ABDOMINAL HYSTERECTOMY    . APPENDECTOMY    . BREAST BIOPSY Left 01/03/2018   left breast stereo/x clip/path pending  . BREAST LUMPECTOMY Left 01/12/2018  . BREAST LUMPECTOMY WITH NEEDLE LOCALIZATION Left 01/12/2018   Procedure: BREAST LUMPECTOMY WITH NEEDLE LOCALIZATION;  Surgeon: Jules Husbands, MD;  Location: [facility] ORS;  Service: General;  Laterality: Left;  . CHOLECYSTECTOMY    . COLON SURGERY    . COLOSTOMY  2008  . COLOSTOMY REVERSAL  2008  . EYE SURGERY    . REPLACEMENT TOTAL KNEE Right 2018  . SENTINEL NODE BIOPSY Left 03/07/2018   Procedure: SENTINEL NODE BIOPSY;  Surgeon: Jules Husbands, MD;  Location: [facility] ORS;  Service: General;  Laterality: Left;    FAMILY HISTORY: Family History  Problem Relation Age of Onset  . Lung cancer Mother 53  . Diabetes Father   . Stroke Father   . Hypertension Maternal Grandmother   . Hypertension Maternal Uncle   . Hypertension Maternal Aunt   . Ovarian cancer Maternal Aunt 72  . Heart disease Daughter     ADVANCED DIRECTIVES (Y/N):  N  HEALTH MAINTENANCE: Social History   Tobacco Use  . Smoking status: Never Smoker  . Smokeless tobacco: Never Used  Vaping Use  . Vaping Use: Never used  Substance Use Topics  . Alcohol use: Never  . Drug  use: Never     Colonoscopy:  PAP:  Bone density:  Lipid panel:  Allergies  Allergen Reactions  . Codeine Nausea Only  . Morphine And Related Nausea Only    Current Outpatient Medications  Medication Sig Dispense Refill  . acetaminophen (TYLENOL) 500 MG tablet Take 1,000 mg by mouth every 6 (six) hours as needed for moderate pain or headache.     Marland Kitchen amLODipine (NORVASC) 5 MG tablet TAKE 1 TABLET(5 MG) BY MOUTH DAILY 90 tablet 1  . aspirin 81 MG  chewable tablet Chew 81 mg by mouth daily.     . Blood Glucose Monitoring Suppl (ONE TOUCH ULTRA 2) w/Device KIT Use to check blood sugar daily, up to 2 times if need 1 each 0  . cephALEXin (KEFLEX) 500 MG capsule Take 1 capsule (500 mg total) by mouth 2 (two) times daily. 14 capsule 0  . cloNIDine (CATAPRES) 0.1 MG tablet TAKE 1 TABLET BY MOUTH DAILY AS NEEDED FOR ELEVATED BLOOD PRESSURE GREATER THAN 180/100/HEADACHE 30 tablet 2  . gabapentin (NEURONTIN) 300 MG capsule TAKE 1 CAPSULE(300 MG) BY MOUTH IN THE MORNING AND AT BEDTIME 180 capsule 2  . Garlic 10 MG CAPS Take by mouth.    . Garlic 161 MG TABS Take by mouth.    . Lancets (ONETOUCH ULTRASOFT) lancets Use to check blood sugar daily, up to 2 times if need 100 each 3  . lisinopril-hydrochlorothiazide (ZESTORETIC) 20-12.5 MG tablet TAKE 1 TABLET BY MOUTH DAILY 90 tablet 1  . Multiple Vitamins-Minerals (CERTAVITE SENIOR/ANTIOXIDANT) TABS Take by mouth.    . ONE TOUCH ULTRA TEST test strip Use to check blood sugar daily, up to 2 times if need 100 each 3  . pravastatin (PRAVACHOL) 20 MG tablet TAKE 1 TABLET(20 MG) BY MOUTH AT BEDTIME 90 tablet 3  . triamcinolone cream (KENALOG) 0.5 % Apply 1 application topically 2 (two) times daily. To affected areas, for up to 2 weeks. 30 g 0   No current facility-administered medications for this visit.    OBJECTIVE: Vitals:   10/10/19 1049  BP: (!) 202/87  Pulse: 66  Resp: 20  Temp: 98.1 F (36.7 C)  SpO2: 100%     Body mass index is 27.22 kg/m.    ECOG FS:0 - Asymptomatic  General: Well-developed, well-nourished, no acute distress. Eyes: Pink conjunctiva, anicteric sclera. HEENT: Normocephalic, moist mucous membranes. Breast: Bilateral breast and axilla without suspicious lumps or masses. Mild tenderness to palpation of right breast. Lungs: No audible wheezing or coughing. Heart: Regular rate and rhythm. Abdomen: Soft, nontender, no obvious distention. Musculoskeletal: No edema, cyanosis,  or clubbing. Neuro: Alert, answering all questions appropriately. Cranial nerves grossly intact. Skin: No rashes or petechiae noted. Psych: Normal affect.  LAB RESULTS:  Lab Results  Component Value Date   NA 141 09/27/2019   K 3.8 09/27/2019   CL 104 09/27/2019   CO2 26 09/27/2019   GLUCOSE 95 09/27/2019   BUN 15 09/27/2019   CREATININE 1.05 (H) 09/27/2019   CALCIUM 9.3 09/27/2019   PROT 8.0 09/27/2019   ALBUMIN 3.4 (L) 09/27/2019   AST 20 09/27/2019   ALT 9 09/27/2019   ALKPHOS 47 09/27/2019   BILITOT 0.9 09/27/2019   GFRNONAA 48 (L) 09/27/2019   GFRAA 55 (L) 09/27/2019    Lab Results  Component Value Date   WBC 5.3 09/27/2019   NEUTROABS 2.4 08/06/2017   HGB 10.7 (L) 09/27/2019   HCT 33.0 (L) 09/27/2019   MCV 85.1 09/27/2019  PLT 255 09/27/2019     STUDIES: CT ABDOMEN PELVIS W CONTRAST  Result Date: 09/27/2019 CLINICAL DATA:  Left upper abdominal pain.  Recent fall. EXAM: CT ABDOMEN AND PELVIS WITH CONTRAST TECHNIQUE: Multidetector CT imaging of the abdomen and pelvis was performed using the standard protocol following bolus administration of intravenous contrast. CONTRAST:  128m OMNIPAQUE IOHEXOL 300 MG/ML  SOLN COMPARISON:  08/13/2019 FINDINGS: Lower chest: No acute abnormality Hepatobiliary: No focal liver abnormality is seen. Status post cholecystectomy. No biliary dilatation. Pancreas: No focal abnormality or ductal dilatation. Spleen: Splenectomy Adrenals/Urinary Tract: No suspicious focal renal or adrenal lesion. No hydronephrosis. Urinary bladder unremarkable. Stomach/Bowel: Stomach, large and small bowel grossly unremarkable. Vascular/Lymphatic: Aortic atherosclerosis. No aneurysm or adenopathy. Reproductive: Prior hysterectomy.  No adnexal masses. Other: No free fluid or free air. Musculoskeletal: Degenerative changes throughout the lumbar spine. No acute bony abnormality. IMPRESSION: No acute findings in the abdomen or pelvis. Aortic atherosclerosis.  Electronically Signed   By: KRolm BaptiseM.D.   On: 09/27/2019 15:18    ASSESSMENT: Stage Ia ER/PR positive, HER-2 negative invasive carcinoma of the left upper outer quadrant breast.    PLAN:  1. Stage Ia ER/PR positive, HER-2 negative invasive carcinoma of the left upper outer quadrant breast: Given patient's advanced age, Oncotype DX was not ordered since patient declined chemotherapy.  Patient completed adjuvant XRT in approximately April 2020.  Patient refuses treatment with letrozole despite acknowledging the increased risk of recurrence.  No intervention is needed at this time. Patient has not had a mammogram in several years, therefore this has been scheduled in the next 1 to 2 weeks. Patient states she is hesitant to undergo the procedure given her breast tenderness. Return to clinic in 1 year for further evaluation.  2.  Breast pain: Chronic and unchanged. No evidence of recurrence. Repeat mammogram as above. 3.  Osteopenia: Patient's most recent bone mineral density on September 08, 2018 reported T score of -1.7.  I recommended calcium and vitamin D supplementation.  Since patient has refused letrozole, bone marrow densities can be monitored by primary care moving forward.   I spent a total of 20 minutes reviewing chart data, face-to-face evaluation with the patient, counseling and coordination of care as detailed above.    Patient expressed understanding and was in agreement with this plan. She also understands that She can call clinic at any time with any questions, concerns, or complaints.     Cancer Staging Primary cancer of upper outer quadrant of left female breast ([facility] Staging form: Breast, AJCC 8th Edition - Clinical stage from 01/11/2018: Stage IA (cT1b, cN0, cM0, G2, ER+, PR+, HER2-) - Signed by FLloyd Huger MD on 02/10/2018   TLloyd Huger MD   10/11/2019 6:44 AM

## 2019-10-09 ENCOUNTER — Encounter: Payer: Self-pay | Admitting: Oncology

## 2019-10-09 NOTE — Progress Notes (Signed)
Patient states she is having a lot of pain in her left breast and is very concerned. Would like to be examined to see if it is scar tissue or something more.

## 2019-10-10 ENCOUNTER — Inpatient Hospital Stay: Payer: Medicare Other

## 2019-10-10 ENCOUNTER — Encounter: Payer: Self-pay | Admitting: Oncology

## 2019-10-10 ENCOUNTER — Other Ambulatory Visit: Payer: Self-pay

## 2019-10-10 ENCOUNTER — Inpatient Hospital Stay: Payer: Medicare Other | Attending: Oncology | Admitting: Oncology

## 2019-10-10 VITALS — BP 202/87 | HR 66 | Temp 98.1°F | Resp 20 | Ht 60.0 in | Wt 139.4 lb

## 2019-10-10 DIAGNOSIS — G8929 Other chronic pain: Secondary | ICD-10-CM | POA: Insufficient documentation

## 2019-10-10 DIAGNOSIS — Z170 Estrogen receptor positive status [ER+]: Secondary | ICD-10-CM | POA: Diagnosis not present

## 2019-10-10 DIAGNOSIS — Z833 Family history of diabetes mellitus: Secondary | ICD-10-CM | POA: Insufficient documentation

## 2019-10-10 DIAGNOSIS — C50412 Malignant neoplasm of upper-outer quadrant of left female breast: Secondary | ICD-10-CM

## 2019-10-10 DIAGNOSIS — I700 Atherosclerosis of aorta: Secondary | ICD-10-CM | POA: Diagnosis not present

## 2019-10-10 DIAGNOSIS — Z885 Allergy status to narcotic agent status: Secondary | ICD-10-CM | POA: Diagnosis not present

## 2019-10-10 DIAGNOSIS — M8580 Other specified disorders of bone density and structure, unspecified site: Secondary | ICD-10-CM | POA: Insufficient documentation

## 2019-10-10 DIAGNOSIS — Z801 Family history of malignant neoplasm of trachea, bronchus and lung: Secondary | ICD-10-CM | POA: Insufficient documentation

## 2019-10-10 DIAGNOSIS — Z823 Family history of stroke: Secondary | ICD-10-CM | POA: Insufficient documentation

## 2019-10-10 DIAGNOSIS — M47816 Spondylosis without myelopathy or radiculopathy, lumbar region: Secondary | ICD-10-CM | POA: Diagnosis not present

## 2019-10-10 DIAGNOSIS — R1012 Left upper quadrant pain: Secondary | ICD-10-CM | POA: Insufficient documentation

## 2019-10-10 DIAGNOSIS — Z8249 Family history of ischemic heart disease and other diseases of the circulatory system: Secondary | ICD-10-CM | POA: Diagnosis not present

## 2019-10-10 DIAGNOSIS — N644 Mastodynia: Secondary | ICD-10-CM | POA: Diagnosis not present

## 2019-10-10 DIAGNOSIS — Z79899 Other long term (current) drug therapy: Secondary | ICD-10-CM | POA: Insufficient documentation

## 2019-10-10 DIAGNOSIS — Z9081 Acquired absence of spleen: Secondary | ICD-10-CM | POA: Diagnosis not present

## 2019-10-10 DIAGNOSIS — I10 Essential (primary) hypertension: Secondary | ICD-10-CM | POA: Insufficient documentation

## 2019-10-10 DIAGNOSIS — Z8041 Family history of malignant neoplasm of ovary: Secondary | ICD-10-CM | POA: Insufficient documentation

## 2019-10-10 DIAGNOSIS — Z9049 Acquired absence of other specified parts of digestive tract: Secondary | ICD-10-CM | POA: Diagnosis not present

## 2019-10-11 LAB — CANCER ANTIGEN 27.29: CA 27.29: 19.9 U/mL (ref 0.0–38.6)

## 2019-10-18 ENCOUNTER — Other Ambulatory Visit: Payer: Medicare Other

## 2019-10-25 ENCOUNTER — Other Ambulatory Visit: Payer: Self-pay

## 2019-10-25 ENCOUNTER — Ambulatory Visit
Admission: RE | Admit: 2019-10-25 | Discharge: 2019-10-25 | Disposition: A | Discharge status: Home / Self Care | Payer: Medicare Other | Source: Ambulatory Visit | Attending: Oncology | Admitting: Oncology

## 2019-10-25 DIAGNOSIS — C50412 Malignant neoplasm of upper-outer quadrant of left female breast: Secondary | ICD-10-CM

## 2019-10-25 DIAGNOSIS — R928 Other abnormal and inconclusive findings on diagnostic imaging of breast: Secondary | ICD-10-CM | POA: Diagnosis not present

## 2019-10-25 HISTORY — DX: Personal history of irradiation: Z92.3

## 2019-11-12 ENCOUNTER — Telehealth: Payer: Self-pay | Admitting: General Practice

## 2019-11-12 ENCOUNTER — Telehealth: Payer: Self-pay

## 2019-11-12 NOTE — Telephone Encounter (Signed)
  Chronic Care Management   Outreach Note  11/12/2019 Name: Jasmene Goswami MRN: [id number] DOB: 1929-09-13  Referred by: [name], DO Reason for referral : Chronic Care Management (RNCM follow up call for chronic disease management and care coordination needs)   An unsuccessful telephone outreach was attempted today. The patient was referred to the case management team for assistance with care management and care coordination.   Follow Up Plan: A HIPAA compliant phone message was left for the patient providing contact information and requesting a return call.   Noreene Larsson RN, MSN, [place] Mobile: [phone]

## 2019-11-14 NOTE — Telephone Encounter (Signed)
Pt has been r/s  

## 2019-12-05 ENCOUNTER — Ambulatory Visit: Payer: Self-pay

## 2019-12-05 NOTE — Telephone Encounter (Signed)
Patient called wanting to make appointment stating that she has been dizzy/lightheaded.  She states that she checked her BP which today was 144/66  Hr 64.  She states that she was unable to check her BS but she is not diabetic.  She states that she was taken off metformin many years ago.  She says that she is alone at home and is afraid that she will fall.  She has a runny nose rt back pain and says when she tries to pass urine she just dribbles. Per protocol patient needs to go to ER. I offered EMS and she refused She has a dog and no one to care for her dog. I attempted to call office for possible resources for her animal in the area but got no answer. She states that she has a neighbor who will be home at 5 tonight. She will coordinate with her and see if she can drive her to the hospital but after 5 pm. Family is out of town.  Reason for Disposition . Patient sounds very sick or weak to the triager  Answer Assessment - Initial Assessment Questions 1. DESCRIPTION: "Describe your dizziness."     lighheades 2. LIGHTHEADED: "Do you feel lightheaded?" (e.g., somewhat faint, woozy, weak upon standing)     lighheaded 3. VERTIGO: "Do you feel like either you or the room is spinning or tilting?" (i.e. vertigo)     No 4. SEVERITY: "How bad is it?"  "Do you feel like you are going to faint?" "Can you stand and walk?"   - MILD: Feels slightly dizzy, but walking normally.   - MODERATE: Feels very unsteady when walking, but not falling; interferes with normal activities (e.g., school, work) .   - SEVERE: Unable to walk without falling, or requires assistance to walk without falling; feels like passing out now.      Moderate afraid she will fall 5. ONSET:  "When did the dizziness begin?"     A few days  6. AGGRAVATING FACTORS: "Does anything make it worse?" (e.g., standing, change in head position)     Stand up makes it worse 7. HEART RATE: "Can you tell me your heart rate?" "How many beats in 15 seconds?"   (Note: not all patients can do this)      HR 84 8. CAUSE: "What do you think is causing the dizziness?"    Unsure maybe blood sugar 9. RECURRENT SYMPTOM: "Have you had dizziness before?" If Yes, ask: "When was the last time?" "What happened that time?"     Off and on 10-12 years afo 10. OTHER SYMPTOMS: "Do you have any other symptoms?" (e.g., fever, chest pain, vomiting, diarrhea, bleeding)       Runny nose 11. PREGNANCY: "Is there any chance you are pregnant?" "When was your last menstrual period?"      N/A  Protocols used: DIZZINESS [facility]

## 2019-12-11 ENCOUNTER — Emergency Department
Admission: EM | Admit: 2019-12-11 | Discharge: 2019-12-11 | Disposition: A | Discharge status: Home / Self Care | Payer: Medicare Other | Attending: Student in an Organized Health Care Education/Training Program | Admitting: Student in an Organized Health Care Education/Training Program

## 2019-12-11 ENCOUNTER — Other Ambulatory Visit: Payer: Self-pay

## 2019-12-11 ENCOUNTER — Emergency Department: Payer: Medicare Other

## 2019-12-11 ENCOUNTER — Encounter: Payer: Self-pay | Admitting: Emergency Medicine

## 2019-12-11 DIAGNOSIS — Z96651 Presence of right artificial knee joint: Secondary | ICD-10-CM | POA: Diagnosis not present

## 2019-12-11 DIAGNOSIS — Z79899 Other long term (current) drug therapy: Secondary | ICD-10-CM | POA: Diagnosis not present

## 2019-12-11 DIAGNOSIS — R0602 Shortness of breath: Secondary | ICD-10-CM | POA: Diagnosis not present

## 2019-12-11 DIAGNOSIS — Z7982 Long term (current) use of aspirin: Secondary | ICD-10-CM | POA: Diagnosis not present

## 2019-12-11 DIAGNOSIS — E1122 Type 2 diabetes mellitus with diabetic chronic kidney disease: Secondary | ICD-10-CM | POA: Insufficient documentation

## 2019-12-11 DIAGNOSIS — N189 Chronic kidney disease, unspecified: Secondary | ICD-10-CM | POA: Diagnosis not present

## 2019-12-11 DIAGNOSIS — R079 Chest pain, unspecified: Secondary | ICD-10-CM | POA: Diagnosis not present

## 2019-12-11 DIAGNOSIS — M25512 Pain in left shoulder: Secondary | ICD-10-CM | POA: Insufficient documentation

## 2019-12-11 DIAGNOSIS — I129 Hypertensive chronic kidney disease with stage 1 through stage 4 chronic kidney disease, or unspecified chronic kidney disease: Secondary | ICD-10-CM | POA: Diagnosis not present

## 2019-12-11 DIAGNOSIS — C50919 Malignant neoplasm of unspecified site of unspecified female breast: Secondary | ICD-10-CM | POA: Diagnosis not present

## 2019-12-11 DIAGNOSIS — Z853 Personal history of malignant neoplasm of breast: Secondary | ICD-10-CM | POA: Diagnosis not present

## 2019-12-11 DIAGNOSIS — M19012 Primary osteoarthritis, left shoulder: Secondary | ICD-10-CM | POA: Diagnosis not present

## 2019-12-11 DIAGNOSIS — N63 Unspecified lump in unspecified breast: Secondary | ICD-10-CM | POA: Diagnosis not present

## 2019-12-11 DIAGNOSIS — R42 Dizziness and giddiness: Secondary | ICD-10-CM | POA: Diagnosis not present

## 2019-12-11 DIAGNOSIS — M542 Cervicalgia: Secondary | ICD-10-CM | POA: Diagnosis not present

## 2019-12-11 DIAGNOSIS — I10 Essential (primary) hypertension: Secondary | ICD-10-CM | POA: Diagnosis not present

## 2019-12-11 DIAGNOSIS — I700 Atherosclerosis of aorta: Secondary | ICD-10-CM | POA: Diagnosis not present

## 2019-12-11 LAB — CBC
HCT: 34.8 % — ABNORMAL LOW (ref 36.0–46.0)
Hemoglobin: 11.4 g/dL — ABNORMAL LOW (ref 12.0–15.0)
MCH: 27.3 pg (ref 26.0–34.0)
MCHC: 32.8 g/dL (ref 30.0–36.0)
MCV: 83.5 fL (ref 80.0–100.0)
Platelets: 297 K/uL (ref 150–400)
RBC: 4.17 MIL/uL (ref 3.87–5.11)
RDW: 16.7 % — ABNORMAL HIGH (ref 11.5–15.5)
WBC: 6.7 K/uL (ref 4.0–10.5)
nRBC: 0 % (ref 0.0–0.2)

## 2019-12-11 LAB — BASIC METABOLIC PANEL WITH GFR
Anion gap: 12 (ref 5–15)
BUN: 24 mg/dL — ABNORMAL HIGH (ref 8–23)
CO2: 27 mmol/L (ref 22–32)
Calcium: 9.5 mg/dL (ref 8.9–10.3)
Chloride: 99 mmol/L (ref 98–111)
Creatinine, Ser: 1.18 mg/dL — ABNORMAL HIGH (ref 0.44–1.00)
GFR, Estimated: 44 mL/min — ABNORMAL LOW (ref >60)
Glucose, Bld: 114 mg/dL — ABNORMAL HIGH (ref 70–99)
Potassium: 4.3 mmol/L (ref 3.5–5.1)
Sodium: 138 mmol/L (ref 135–145)

## 2019-12-11 LAB — TROPONIN I (HIGH SENSITIVITY)
Troponin I (High Sensitivity): 8 ng/L (ref <18)
Troponin I (High Sensitivity): 9 ng/L (ref <18)

## 2019-12-11 LAB — CBG MONITORING, ED: Glucose-Capillary: 87 mg/dL (ref 70–99)

## 2019-12-11 MED ORDER — IOHEXOL 350 MG/ML SOLN
60.0000 mL | Freq: Once | INTRAVENOUS | Status: AC | PRN
  Administered 2019-12-11: 60 mL via INTRAVENOUS

## 2019-12-11 MED ORDER — ACETAMINOPHEN 325 MG PO TABS
650.0000 mg | ORAL_TABLET | Freq: Once | ORAL | Status: AC
  Administered 2019-12-11: 650 mg via ORAL
  Filled 2019-12-11: qty 2

## 2019-12-11 NOTE — ED Notes (Signed)
Patient transported to X-ray 

## 2019-12-11 NOTE — ED Provider Notes (Signed)
[facility] Emergency Department Provider Note    First MD Initiated Contact with Patient 12/11/19 [phone]     (approximate)  I have reviewed the triage vital signs and the nursing notes.   HISTORY  Chief Complaint Dizziness and Arm Pain    HPI Ellen [name] is a 86 y.o. female with below listed past medical history presents to the ER for evaluation of neck and left shoulder pain since Sunday. States it is worse with movement. She denies any fall. Is also having some shortness of breath with it. Patient tearful secondary to pain. Does not take anything for the pain at home. Rates it as moderate to severe. Has not had any measured fevers. No cough. Pain is worsened with palpation of the left shoulder and neck    Past Medical History:  Diagnosis Date  . Arthritis   . Cancer ([place]) 01/2018   left breast   . Cataract   . Chronic headaches   . CKD (chronic kidney disease)   . Diverticulitis   . Family history of ovarian cancer   . History of syncope   . Hypertension   . Multinodular goiter   . Personal history of radiation therapy   . Primary osteoarthritis of left knee   . Pulmonary hypertension (HCC)    Family History  Problem Relation Age of Onset  . Lung cancer Mother 70  . Diabetes Father   . Stroke Father   . Hypertension Maternal Grandmother   . Hypertension Maternal Uncle   . Hypertension Maternal Aunt   . Ovarian cancer Maternal Aunt 31  . Heart disease Daughter    Past Surgical History:  Procedure Laterality Date  . ABDOMINAL HYSTERECTOMY    . APPENDECTOMY    . BREAST BIOPSY Left 01/03/2018   left breast stereo/x clip/positive  . BREAST LUMPECTOMY Left 01/12/2018  . BREAST LUMPECTOMY WITH NEEDLE LOCALIZATION Left 01/12/2018   Procedure: BREAST LUMPECTOMY WITH NEEDLE LOCALIZATION;  Surgeon: Jules Husbands, MD;  Location: [facility] ORS;  Service: General;  Laterality: Left;  . CHOLECYSTECTOMY    . COLON SURGERY    . COLOSTOMY  2008  .  COLOSTOMY REVERSAL  2008  . EYE SURGERY    . REPLACEMENT TOTAL KNEE Right 2018  . SENTINEL NODE BIOPSY Left 03/07/2018   Procedure: SENTINEL NODE BIOPSY;  Surgeon: Jules Husbands, MD;  Location: [facility] ORS;  Service: General;  Laterality: Left;   Patient Active Problem List   Diagnosis Date Noted  . Genetic testing 10/06/2018  . Family history of ovarian cancer   . Rotator cuff arthropathy of right shoulder 06/16/2018  . Rotator cuff arthropathy of left shoulder 03/13/2018  . Chronic pain of both shoulders 03/13/2018  . Primary cancer of upper outer quadrant of left female breast ([place]) 01/08/2018  . Type 2 diabetes mellitus with diabetic nephropathy, without long-term current use of insulin ([place]) 11/10/2017  . Chronic headaches 11/10/2017  . Hyperlipidemia associated with type 2 diabetes mellitus ([place]) 11/10/2017  . Essential hypertension 11/10/2017  . Enteritis 09/18/2017      Prior to Admission medications   Medication Sig Start Date End Date Taking? Authorizing Provider  acetaminophen (TYLENOL) 500 MG tablet Take 1,000 mg by mouth every 6 (six) hours as needed for moderate pain or headache.     [provider]  amLODipine (NORVASC) 5 MG tablet TAKE 1 TABLET(5 MG) BY MOUTH DAILY 06/03/19   Parks Ranger, Devonne Doughty, DO  aspirin 81 MG chewable tablet Chew 81  mg by mouth daily.     [provider]  Blood Glucose Monitoring Suppl (ONE TOUCH ULTRA 2) w/Device KIT Use to check blood sugar daily, up to 2 times if need 06/16/18   Parks Ranger, Devonne Doughty, DO  cephALEXin (KEFLEX) 500 MG capsule Take 1 capsule (500 mg total) by mouth 2 (two) times daily. 09/27/19   Lavonia Drafts, MD  cloNIDine (CATAPRES) 0.1 MG tablet TAKE 1 TABLET BY MOUTH DAILY AS NEEDED FOR ELEVATED BLOOD PRESSURE GREATER THAN 180/100/HEADACHE 04/23/19   Karamalegos, Devonne Doughty, DO  gabapentin (NEURONTIN) 300 MG capsule TAKE 1 CAPSULE(300 MG) BY MOUTH IN THE MORNING AND AT BEDTIME 08/23/19   Karamalegos, Devonne Doughty, DO  Garlic 10 MG CAPS Take by mouth.    [provider]  Garlic 824 MG TABS Take by mouth.    [provider]  Lancets Glory Rosebush ULTRASOFT) lancets Use to check blood sugar daily, up to 2 times if need 06/16/18   [name], DO  lisinopril-hydrochlorothiazide (ZESTORETIC) 20-12.5 MG tablet TAKE 1 TABLET BY MOUTH DAILY 02/26/19   [name], DO  Multiple Vitamins-Minerals (CERTAVITE SENIOR/ANTIOXIDANT) TABS Take by mouth.    [provider]  ONE TOUCH ULTRA TEST test strip Use to check blood sugar daily, up to 2 times if need 06/16/18   [name], DO  pravastatin (PRAVACHOL) 20 MG tablet TAKE 1 TABLET(20 MG) BY MOUTH AT BEDTIME 06/05/19   Karamalegos, Devonne Doughty, DO  triamcinolone cream (KENALOG) 0.5 % Apply 1 application topically 2 (two) times daily. To affected areas, for up to 2 weeks. 08/10/19   Karamalegos, Devonne Doughty, DO    Allergies Codeine and Morphine and related    Social History Social History   Tobacco Use  . Smoking status: Never Smoker  . Smokeless tobacco: Never Used  Vaping Use  . Vaping Use: Never used  Substance Use Topics  . Alcohol use: Never  . Drug use: Never    Review of Systems Patient denies headaches, rhinorrhea, blurry vision, numbness, shortness of breath, chest pain, edema, cough, abdominal pain, nausea, vomiting, diarrhea, dysuria, fevers, rashes or hallucinations unless otherwise stated above in HPI. ____________________________________________   PHYSICAL EXAM:  VITAL SIGNS: Vitals:   12/11/19 0936 12/11/19 1000  BP: (!) 146/54 125/61  Pulse: 67 70  Resp: 20 16  Temp: 99 F (37.2 C)   SpO2: 100% 98%    Constitutional: Alert and oriented.  Eyes: Conjunctivae are normal.  Head: Atraumatic. Nose: No congestion/rhinnorhea. Mouth/Throat: Mucous membranes are moist.   Neck: No stridor. Painless ROM.  Cardiovascular: Normal rate, regular rhythm. Grossly normal heart sounds.   Good peripheral circulation. Respiratory: Normal respiratory effort.  No retractions. Lungs CTAB. Gastrointestinal: Soft and nontender. No distention. No abdominal bruits. No CVA tenderness. Genitourinary:  Musculoskeletal: No lower extremity tenderness nor edema.  No joint effusions. Neurologic:  Normal speech and language. No gross focal neurologic deficits are appreciated. No facial droop Skin:  Skin is warm, dry and intact. No rash noted. Psychiatric: Mood and affect are normal. Speech and behavior are normal.  ____________________________________________   LABS (all labs ordered are listed, but only abnormal results are displayed)  Results for orders placed or performed during the hospital encounter of 12/11/19 (from the past 24 hour(s))  CBG monitoring, ED     Status: None   Collection Time: 12/11/19  9:38 AM  Result Value Ref Range   Glucose-Capillary 87 70 - 99 mg/dL   Comment 1 Notify RN  Comment 2 Document in Chart   CBC     Status: Abnormal   Collection Time: 12/11/19  9:58 AM  Result Value Ref Range   WBC 6.7 4.0 - 10.5 K/uL   RBC 4.17 3.87 - 5.11 MIL/uL   Hemoglobin 11.4 (L) 12.0 - 15.0 g/dL   HCT 34.8 (L) 36 - 46 %   MCV 83.5 80.0 - 100.0 fL   MCH 27.3 26.0 - 34.0 pg   MCHC 32.8 30.0 - 36.0 g/dL   RDW 16.7 (H) 11.5 - 15.5 %   Platelets 297 150 - 400 K/uL   nRBC 0.0 0.0 - 0.2 %   ____________________________________________  EKG My review and personal interpretation at Time: 9:36   Indication: shoulder pain  Rate: 60  Rhythm: sinus Axis: normal Other: normal intervals, no stemi ____________________________________________  RADIOLOGY  I personally reviewed all radiographic images ordered to evaluate for the above acute complaints and reviewed radiology reports and findings.  These findings were personally discussed with the patient.  Please see medical record for radiology  report.  ____________________________________________   PROCEDURES  Procedure(s) performed:  Procedures    Critical Care performed: no ____________________________________________   INITIAL IMPRESSION / ASSESSMENT AND PLAN / ED COURSE  Pertinent labs & imaging results that were available during my care of the patient were reviewed by me and considered in my medical decision making (see chart for details).   DDX: ACS, pericarditis, esophagitis, boerhaaves, pe, dissection, pna, bronchitis, costochondritis   Jerry Simmonds is a 86 y.o. who presents to the ED with presentation as described above.  She does seem quite uncomfortable.  Pain is worsened with movement of her left arm and left trapezius.  No midline cervical tenderness.  No overlying warmth.  No rash to suggest shingles.  Her EKG is nonischemic.  Will give Tylenol she does have a history of arthritis but with his vague symptoms of shortness of breath as well and mildly hypertensive with history of aortic atherosclerosis will order blood work imaging as well for the but differential.  Will reassess.  Her abdominal exam is soft and benign.  Clinical Course as of Dec 11 1346  Tue Dec 11, 2019  1123 Blood work is fairly reassuring however given the patient's description of pain radiating to her shoulder I do feel that in the setting of her age with hypertension and extent of her pain that a CTA dissection protocol is clinically indicated.   [PR]  1345 Patient reassessed.  Feels much improved after Tylenol.  Her work-up here is reassuring.  SPECT arthritis or musculoskeletal strain.  I instructed her on appropriate dosing and use of Tylenol.  Patient agreeable to plan.  Have discussed with the patient and available family all diagnostics and treatments performed thus far and all questions were answered to the best of my ability. The patient demonstrates understanding and agreement with plan.    [PR]    Clinical Course User  Index [PR] Merlyn Lot, MD    The patient was evaluated in Emergency Department today for the symptoms described in the history of present illness. He/she was evaluated in the context of the global COVID-19 pandemic, which necessitated consideration that the patient might be at risk for infection with the SARS-CoV-2 virus that causes COVID-19. Institutional protocols and algorithms that pertain to the evaluation of patients at risk for COVID-19 are in a state of rapid change based on information released by regulatory bodies including the CDC and federal and state organizations. These  policies and algorithms were followed during the patient's care in the ED.  As part of my medical decision making, I reviewed the following data within the [place] notes reviewed and incorporated, Labs reviewed, notes from prior ED visits and [place] Controlled Substance Database   ____________________________________________   FINAL CLINICAL IMPRESSION(S) / ED DIAGNOSES  Final diagnoses:  Acute pain of left shoulder      NEW MEDICATIONS STARTED DURING THIS VISIT:  New Prescriptions   No medications on file     Note:  This document was prepared using Dragon voice recognition software and may include unintentional dictation errors.    Merlyn Lot, MD 12/11/19 1350

## 2019-12-11 NOTE — ED Triage Notes (Signed)
Patient to ER for c/o pain that radiates from neck down both arms. Patient also reports shortness of breath, chest pain, and dizziness. Patient reports all s/s started on Sunday.

## 2019-12-17 ENCOUNTER — Telehealth: Payer: Medicare Other | Admitting: General Practice

## 2019-12-17 ENCOUNTER — Ambulatory Visit: Payer: Self-pay | Admitting: General Practice

## 2019-12-17 DIAGNOSIS — E1121 Type 2 diabetes mellitus with diabetic nephropathy: Secondary | ICD-10-CM

## 2019-12-17 DIAGNOSIS — E1169 Type 2 diabetes mellitus with other specified complication: Secondary | ICD-10-CM

## 2019-12-17 DIAGNOSIS — G8929 Other chronic pain: Secondary | ICD-10-CM

## 2019-12-17 DIAGNOSIS — I10 Essential (primary) hypertension: Secondary | ICD-10-CM

## 2019-12-17 NOTE — Chronic Care Management (AMB) (Signed)
Chronic Care Management   Follow Up Note   12/17/2019 Name: Ellen [name] MRN: [id number] DOB: 1929/04/09  Referred by: [name], DO Reason for referral : Chronic Care Management (RNCM Chronic Disease Management and Care Coordination Needs)   Ellen [name] is a 86 y.o. year old female who is a primary care patient of [name], DO. The CCM team was consulted for assistance with chronic disease management and care coordination needs.    Review of patient status, including review of consultants reports, relevant laboratory and other test results, and collaboration with appropriate care team members and the patient's provider was performed as part of comprehensive patient evaluation and provision of chronic care management services.    SDOH (Social Determinants of Health) assessments performed: Yes See Care Plan activities for detailed interventions related to [facility])     Outpatient Encounter Medications as of 12/17/2019  Medication Sig  . acetaminophen (TYLENOL) 500 MG tablet Take 1,000 mg by mouth every 6 (six) hours as needed for moderate pain or headache.   Marland Kitchen amLODipine (NORVASC) 5 MG tablet TAKE 1 TABLET(5 MG) BY MOUTH DAILY  . aspirin 81 MG chewable tablet Chew 81 mg by mouth daily.   . Blood Glucose Monitoring Suppl (ONE TOUCH ULTRA 2) w/Device KIT Use to check blood sugar daily, up to 2 times if need  . cephALEXin (KEFLEX) 500 MG capsule Take 1 capsule (500 mg total) by mouth 2 (two) times daily.  . cloNIDine (CATAPRES) 0.1 MG tablet TAKE 1 TABLET BY MOUTH DAILY AS NEEDED FOR ELEVATED BLOOD PRESSURE GREATER THAN 180/100/HEADACHE  . gabapentin (NEURONTIN) 300 MG capsule TAKE 1 CAPSULE(300 MG) BY MOUTH IN THE MORNING AND AT BEDTIME  . Garlic 10 MG CAPS Take by mouth.  . Garlic 384 MG TABS Take by mouth.  . Lancets (ONETOUCH ULTRASOFT) lancets Use to check blood sugar daily, up to 2 times if need  . lisinopril-hydrochlorothiazide (ZESTORETIC) 20-12.5  MG tablet TAKE 1 TABLET BY MOUTH DAILY  . Multiple Vitamins-Minerals (CERTAVITE SENIOR/ANTIOXIDANT) TABS Take by mouth.  . ONE TOUCH ULTRA TEST test strip Use to check blood sugar daily, up to 2 times if need  . pravastatin (PRAVACHOL) 20 MG tablet TAKE 1 TABLET(20 MG) BY MOUTH AT BEDTIME  . triamcinolone cream (KENALOG) 0.5 % Apply 1 application topically 2 (two) times daily. To affected areas, for up to 2 weeks.   No facility-administered encounter medications on file as of 12/17/2019.     Objective:  BP Readings from Last 3 Encounters:  12/11/19 (!) 116/54  10/10/19 (!) 202/87  09/27/19 (!) 145/80    Goals Addressed              This Visit's Progress   .  RNCM: Pt- "I take my blood pressure sometimes" (pt-stated)        CARE PLAN ENTRY (see longtitudinal plan of care for additional care plan information)  Current Barriers:  . Chronic Disease Management support, education, and care coordination needs related to HTN, HLD, DMII, and Chronic headache pain  . Limited social support  Clinical Goal(s) related to HTN, HLD, DMII, and Chronic headache pain :  Over the next 120 days, patient will:  . Work with the care management team to address educational, disease management, and care coordination needs  . Begin or continue self health monitoring activities as directed today Measure and record cbg (blood glucose) 2 times a week or as instructed by her pcp, Measure and record blood pressure 5 times per  week, and adhere to a Heart healthy Diet . Call provider office for new or worsened signs and symptoms Blood glucose findings outside established parameters, Blood pressure findings outside established parameters, Shortness of breath, and New or worsened symptom related to HLD or pain unresolved with chronic headaches and discomfort.  . Call care management team with questions or concerns . Verbalize basic understanding of patient centered plan of care established today  Interventions  related to HTN, HLD, DMII, and Chronic headache pain :  . Evaluation of current treatment plans and patient's adherence to plan as established by provider. 09-10-2019: The patient says since they have adjusted her medications she is not having  headaches. She is doing well.   . Assessed patient understanding of disease states- the patient says she does not take anything for blood sugar elevation. She does not check her blood sugars often. She says she knows when it gets elevated because of the way she feels. Education on asking the provider how often she should take her blood sugar readings and record. Education on hyperglycemia and hypoglycemia. 12-17-2019: The patient says her blood sugars vary. Sometimes it is 87, 90, and 120.  She knows the sx/sx hyperglycemia and hypoglycemia.  The patient was in the ER on 12-11-2019 for pain in her neck and arm and complaint of dizziness. The patient states that she took Tylenol and felt better. She likely did something and pulled a muscle. She states they had sent her a text message and ask her to call them about a CT test she had there. The RNCM looked up the information and gave the patient the number to call to get results. The patient states she will call and get an appointment for follow up with Dr. Parks Ranger.  . Assessed patient's education and care coordination needs- the patient takes her blood pressure readings and records. 12-17-2019: The patient says her blood pressure is consistently 140/70.  The patient admits when she is stressed out that her blood pressure goes up. She said she has seen readings of 178/87, 187/90 and 203/90.  Education on monitoring for elevated blood pressure. She states lately when she has been taking it BID that her pressures are staying around 140/70.   Marland Kitchen Provided disease specific education to patient- education on the benefits of heart healthy diet.  The patient tries to watch her sodium. Education on low sodium options. The patient  loves to fish and eats a lot of fish. The patient educated on diet high in sodium can cause issues with blood pressure, kidneys, and eyes. 12-17-2019: Review of heart healthy/ADA diet. The patient tries to watch what she is eating but sometimes does not. Encouraged the patient to monitor dietary intake and avoid foods high is fats, salt, and sugars. The patient ate with her son yesterday. For Thanksgiving she plans on eating at home alone. She has gotten a small [place] and will have some vegetables. She is happy to be at home for Thanksgiving. Education on changing position slowly that she may be experiencing a drop in her blood pressure. Review of orthostatic hypotension. The patient states she will be more mindful of how she moves from lying to sitting, sitting to standing.  . Evaluation of activity level. 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 The patient has a son who is currently out of the country and a daughter that lives about 55 miles from her.  She goes often to her daughters house. She says she is very independent. Has someone that helps her with the yard sometimes. Review of the care guides being able to assist with finding help with resources in the area. The patient will let the RNCM know if she needs an assistance in the future.  Nash Dimmer with appropriate clinical care team members regarding patient needs- the patient denies any concerns for LCSW and pharmacist.  . Evaluation of upcoming appointments: No upcoming appointments. The patient reminded to make an appointment with Dr. Parks Ranger to be evaluated post ER visit.    Patient Self Care Activities related to CAD, HLD, DMII, and Chronic headache pain :  . Patient is unable to independently self-manage chronic health conditions  Please see past updates related to this goal by clicking on the "Past Updates" button in the selected goal          Plan:    Telephone follow up appointment with care management team member scheduled for: 02-11-2020 at 1 pm   [place], MSN, [facility] Mobile: [phone]

## 2019-12-17 NOTE — Patient Instructions (Addendum)
Visit Information  Goals Addressed              This Visit's Progress   .  RNCM: Pt- "I take my blood pressure sometimes" (pt-stated)        CARE PLAN ENTRY (see longtitudinal plan of care for additional care plan information)  Current Barriers:  . Chronic Disease Management support, education, and care coordination needs related to HTN, HLD, DMII, and Chronic headache pain  . Limited social support  Clinical Goal(s) related to HTN, HLD, DMII, and Chronic headache pain :  Over the next 120 days, patient will:  . Work with the care management team to address educational, disease management, and care coordination needs  . Begin or continue self health monitoring activities as directed today Measure and record cbg (blood glucose) 2 times a week or as instructed by her pcp, Measure and record blood pressure 5 times per week, and adhere to a Heart healthy Diet . Call provider office for new or worsened signs and symptoms Blood glucose findings outside established parameters, Blood pressure findings outside established parameters, Shortness of breath, and New or worsened symptom related to HLD or pain unresolved with chronic headaches and discomfort.  . Call care management team with questions or concerns . Verbalize basic understanding of patient centered plan of care established today  Interventions related to HTN, HLD, DMII, and Chronic headache pain :  . Evaluation of current treatment plans and patient's adherence to plan as established by provider. 09-10-2019: The patient says since they have adjusted her medications she is not having  headaches. She is doing well.   . Assessed patient understanding of disease states- the patient says she does not take anything for blood sugar elevation. She does not check her blood sugars often. She says she knows when it gets elevated because of the way she feels. Education on asking the provider how often she should take her blood sugar readings and  record. Education on hyperglycemia and hypoglycemia. 12-17-2019: The patient says her blood sugars vary. Sometimes it is 87, 90, and 120.  She knows the sx/sx hyperglycemia and hypoglycemia.  The patient was in the ER on 12-11-2019 for pain in her neck and arm and complaint of dizziness. The patient states that she took Tylenol and felt better. She likely did something and pulled a muscle. She states they had sent her a text message and ask her to call them about a CT test she had there. The RNCM looked up the information and gave the patient the number to call to get results. The patient states she will call and get an appointment for follow up with Dr. Parks Ranger.  . Assessed patient's education and care coordination needs- the patient takes her blood pressure readings and records. 12-17-2019: The patient says her blood pressure is consistently 140/70.  The patient admits when she is stressed out that her blood pressure goes up. She said she has seen readings of 178/87, 187/90 and 203/90.  Education on monitoring for elevated blood pressure. She states lately when she has been taking it BID that her pressures are staying around 140/70.   Marland Kitchen Provided disease specific education to patient- education on the benefits of heart healthy diet.  The patient tries to watch her sodium. Education on low sodium options. The patient loves to fish and eats a lot of fish. The patient educated on diet high in sodium can cause issues with blood pressure, kidneys, and eyes. 12-17-2019: Review of heart healthy/ADA diet.  The patient tries to watch what she is eating but sometimes does not. Encouraged the patient to monitor dietary intake and avoid foods high is fats, salt, and sugars. The patient ate with her son yesterday. For Thanksgiving she plans on eating at home alone. She has gotten a small [place] and will have some vegetables. She is happy to be at home for Thanksgiving. Education on changing position slowly that she may be  experiencing a drop in her blood pressure. Review of orthostatic hypotension. The patient states she will be more mindful of how she moves from lying to sitting, sitting to standing.  . Evaluation of activity level. 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 The patient has a son who is currently out of the country and a daughter that lives about 54 miles from her. She goes often to her daughters house. She says she is very independent. Has someone that helps her with the yard sometimes. Review of the care guides being able to assist with finding help with resources in the area. The patient will let the RNCM know if she needs an assistance in the future.  Nash Dimmer with appropriate clinical care team members regarding patient needs- the patient denies any concerns for LCSW and pharmacist.  . Evaluation of upcoming appointments: No upcoming appointments. The patient reminded to make an appointment with Dr. Parks Ranger to be evaluated post ER visit.    Patient Self Care Activities related to CAD, HLD, DMII, and Chronic headache pain :  . Patient is unable to independently self-manage chronic health conditions  Please see past updates related to this goal by clicking on the "Past Updates" button in the selected goal         The patient verbalized understanding of instructions, educational materials, and care plan provided today and declined offer to receive copy of patient instructions, educational materials, and care plan.   Telephone follow up appointment with care management team member scheduled for: 02-11-2020 at 1 pm  Noreene Larsson RN, MSN, [facility] Mobile: [phone]   Orthostatic Hypotension Blood pressure is a measurement of how strongly, or weakly, your blood is pressing against the walls of your arteries. Orthostatic  hypotension is a sudden drop in blood pressure that happens when you quickly change positions, such as when you get up from sitting or lying down. Arteries are blood vessels that carry blood from your heart throughout your body. When blood pressure is too low, you may not get enough blood to your brain or to the rest of your organs. This can cause weakness, light-headedness, rapid heartbeat, and fainting. This can last for just a few seconds or for up to a few minutes. Orthostatic hypotension is usually not a serious problem. However, if it happens frequently or gets worse, it may be a sign of something more serious. What are the causes? This condition may be caused by:  Sudden changes in posture, such as standing up quickly after you have been sitting or lying down.  Blood loss.  Loss of body fluids (dehydration).  Heart problems.  Hormone (endocrine) problems.  Pregnancy.  Severe infection.  Lack of certain nutrients.  Severe allergic reactions (anaphylaxis).  Certain medicines, such as blood pressure medicine or medicines that make the body lose excess fluids (diuretics). Sometimes, this condition can be caused by not taking medicine  as directed, such as taking too much of a certain medicine. What increases the risk? The following factors may make you more likely to develop this condition:  Age. Risk increases as you get older.  Conditions that affect the heart or the central nervous system.  Taking certain medicines, such as blood pressure medicine or diuretics.  Being pregnant. What are the signs or symptoms? Symptoms of this condition may include:  Weakness.  Light-headedness.  Dizziness.  Blurred vision.  Fatigue.  Rapid heartbeat.  Fainting, in severe cases. How is this diagnosed? This condition is diagnosed based on:  Your medical history.  Your symptoms.  Your blood pressure measurement. Your health care provider will check your blood pressure when  you are: ? Lying down. ? Sitting. ? Standing. A blood pressure reading is recorded as two numbers, such as "120 over 80" (or 120/80). The first ("top") number is called the systolic pressure. It is a measure of the pressure in your arteries as your heart beats. The second ("bottom") number is called the diastolic pressure. It is a measure of the pressure in your arteries when your heart relaxes between beats. Blood pressure is measured in a unit called mm Hg. Healthy blood pressure for most adults is 120/80. If your blood pressure is below 90/60, you may be diagnosed with hypotension. Other information or tests that may be used to diagnose orthostatic hypotension include:  Your other vital signs, such as your heart rate and temperature.  Blood tests.  Tilt table test. For this test, you will be safely secured to a table that moves you from a lying position to an upright position. Your heart rhythm and blood pressure will be monitored during the test. How is this treated? This condition may be treated by:  Changing your diet. This may involve eating more salt (sodium) or drinking more water.  Taking medicines to raise your blood pressure.  Changing the dosage of certain medicines you are taking that might be lowering your blood pressure.  Wearing compression stockings. These stockings help to prevent blood clots and reduce swelling in your legs. In some cases, you may need to go to the hospital for:  Fluid replacement. This means you will receive fluids through an IV.  Blood replacement. This means you will receive donated blood through an IV (transfusion).  Treating an infection or heart problems, if this applies.  Monitoring. You may need to be monitored while medicines that you are taking wear off. Follow these instructions at home: Eating and drinking   Drink enough fluid to keep your urine pale yellow.  Eat a healthy diet, and follow instructions from your health care  provider about eating or drinking restrictions. A healthy diet includes: ? Fresh fruits and vegetables. ? Whole grains. ? Lean meats. ? Low-fat dairy products.  Eat extra salt only as directed. Do not add extra salt to your diet unless your health care provider told you to do that.  Eat frequent, small meals.  Avoid standing up suddenly after eating. Medicines  Take over-the-counter and prescription medicines only as told by your health care provider. ? Follow instructions from your health care provider about changing the dosage of your current medicines, if this applies. ? Do not stop or adjust any of your medicines on your own. General instructions   Wear compression stockings as told by your health care provider.  Get up slowly from lying down or sitting positions. This gives your blood pressure a chance to adjust.  Avoid hot showers and excessive heat as directed by your health care provider.  Return to your normal activities as told by your health care provider. Ask your health care provider what activities are safe for you.  Do not use any products that contain nicotine or tobacco, such as cigarettes, e-cigarettes, and chewing tobacco. If you need help quitting, ask your health care provider.  Keep all follow-up visits as told by your health care provider. This is important. Contact a health care provider if you:  Vomit.  Have diarrhea.  Have a fever for more than 2-3 days.  Feel more thirsty than usual.  Feel weak and tired. Get help right away if you:  Have chest pain.  Have a fast or irregular heartbeat.  Develop numbness in any part of your body.  Cannot move your arms or your legs.  Have trouble speaking.  Become sweaty or feel light-headed.  Faint.  Feel short of breath.  Have trouble staying awake.  Feel confused. Summary  Orthostatic hypotension is a sudden drop in blood pressure that happens when you quickly change  positions.  Orthostatic hypotension is usually not a serious problem.  It is diagnosed by having your blood pressure taken lying down, sitting, and then standing.  It may be treated by changing your diet or adjusting your medicines. This information is not intended to replace advice given to you by your health care provider. Make sure you discuss any questions you have with your health care provider. Document Revised: 07/14/2017 Document Reviewed: 07/14/2017 Elsevier Patient Education  [place].

## 2019-12-18 ENCOUNTER — Ambulatory Visit (INDEPENDENT_AMBULATORY_CARE_PROVIDER_SITE_OTHER): Payer: Medicare Other | Admitting: Family Medicine

## 2019-12-18 ENCOUNTER — Encounter: Payer: Self-pay | Admitting: Family Medicine

## 2019-12-18 ENCOUNTER — Other Ambulatory Visit: Payer: Self-pay

## 2019-12-18 VITALS — BP 172/61 | HR 61 | Temp 97.5°F | Resp 16 | Ht 60.0 in | Wt 137.0 lb

## 2019-12-18 DIAGNOSIS — I700 Atherosclerosis of aorta: Secondary | ICD-10-CM | POA: Insufficient documentation

## 2019-12-18 DIAGNOSIS — E042 Nontoxic multinodular goiter: Secondary | ICD-10-CM

## 2019-12-18 DIAGNOSIS — M25512 Pain in left shoulder: Secondary | ICD-10-CM | POA: Diagnosis not present

## 2019-12-18 DIAGNOSIS — E049 Nontoxic goiter, unspecified: Secondary | ICD-10-CM

## 2019-12-18 NOTE — Patient Instructions (Addendum)
Thank you for coming to the office today.  Stay tuned for call for scheduling Thyroid Ultrasound of neck at [facility].  Then we may consider a blood panel and a referral to Thyroid specialist IF needed.  Otherwise imaging looks good.  Keep up the good work.  Please schedule a Follow-up Appointment to: Return in about 3 months (around 03/19/2020) for 3 month DM A1c, thyroid follow-up.  If you have any other questions or concerns, please feel free to call the office or send a message through [place]. You may also schedule an earlier appointment if necessary.  Additionally, you may be receiving a survey about your experience at our office within a few days to 1 week by e-mail or mail. We value your feedback.  [name], DO [place]

## 2019-12-18 NOTE — Progress Notes (Signed)
Subjective:    Patient ID: Ellen [name], female    DOB: 1929-05-19, 86 y.o.   MRN: [id number]  Ellen [name] is a 86 y.o. female presenting on 12/18/2019 for Hospitalization Follow-up (shoulder pain)   HPI    ED FOLLOW-UP VISIT  Hospital/Location: [facility] Date of ED Visit: 12/11/19  Reason for Presenting to ED: 12/11/19 Primary (+Secondary) Diagnosis: Left shoulder pain  FOLLOW-UP  - ED provider note and record have been reviewed - Patient presents today about 1 week after recent ED visit. Brief summary of recent course, patient had symptoms of Left shoulder pain worse with movement, did not have fall, had some symptoms with unable to turn neck to the left presented to ED on 11/9, testing in ED with x-ray, labs ekg, CTA, treated with conservative care, identified enlarged thyroid on CTA - advised follow-up. . - Today reports overall has done well after discharge from ED. Symptoms of L shoulder pain have resolved with Tylenol and rest. She has good range of motion. No new problem.  Enlarged Thyroid, incidental on CTA imaging New problem identified on CTA Chest, showed enlarged thyroid see result below No prior problem, no TSH or lab on file She was advised to f/u here today  Aortic Atherosclerosis On CTA imaging identified She takes Aspirin 81 and Pravastatin currently   Depression screen [facility] 2/9 04/23/2019 09/21/2018 04/12/2018  Decreased Interest 0 0 0  Down, Depressed, Hopeless 0 0 0  PHQ - 2 Score 0 0 0    Social History   Tobacco Use  . Smoking status: Never Smoker  . Smokeless tobacco: Never Used  Vaping Use  . Vaping Use: Never used  Substance Use Topics  . Alcohol use: Never  . Drug use: Never    Review of Systems Per HPI unless specifically indicated above     Objective:    BP (!) 172/61   Pulse 61   Temp (!) 97.5 F (36.4 C) (Temporal)   Resp 16   Ht 5' (1.524 m)   Wt 137 lb (62.1 kg)   SpO2 100%   BMI 26.76 kg/m   Wt Readings from Last 3  Encounters:  12/18/19 137 lb (62.1 kg)  12/11/19 122 lb (55.3 kg)  10/10/19 139 lb 6.4 oz (63.2 kg)    Physical Exam Vitals and nursing note reviewed.  Constitutional:      General: She is not in acute distress.    Appearance: She is well-developed. She is not diaphoretic.     Comments: Well-appearing, comfortable, cooperative  HENT:     Head: Normocephalic and atraumatic.  Eyes:     General:        Right eye: No discharge.        Left eye: No discharge.     Conjunctiva/sclera: Conjunctivae normal.  Neck:     Comments: No focal thyroid enlargement on physical exam or nodularity Cardiovascular:     Rate and Rhythm: Normal rate.  Pulmonary:     Effort: Pulmonary effort is normal.  Musculoskeletal:     Cervical back: Normal range of motion and neck supple. No tenderness.  Skin:    General: Skin is warm and dry.     Findings: No erythema or rash.  Neurological:     Mental Status: She is alert and oriented to person, place, and time.  Psychiatric:        Behavior: Behavior normal.     Comments: Well groomed, good eye contact, normal speech and thoughts  DG Shoulder LeftPerformed 12/11/2019 Final result  Study Result CLINICAL DATA: Left shoulder pain. Evaluate for fracture.  EXAM: LEFT SHOULDER - 2+ VIEW  COMPARISON: Left shoulder radiographs 12/16/2017.  FINDINGS: Progressive degenerative change is present in the left humeral head. Large osteophytes are now present. Further sclerotic changes are noted at the glenoid. Calcific changes are again noted at the rotator cuff insertion. Atherosclerotic changes are noted in the aorta.  IMPRESSION: 1. Progressive degenerative changes of the left shoulder. 2. No acute fracture. 3. Chronic calcific tendinopathy of the rotator cuff insertion. 4. Aortic atherosclerosis.   Electronically Signed By: San Morelle M.D. On: 12/11/2019 10:43  -------  DG Shoulder LeftPerformed 12/11/2019 Final result  Study  Result CLINICAL DATA: Left shoulder pain. Evaluate for fracture.  EXAM: LEFT SHOULDER - 2+ VIEW  COMPARISON: Left shoulder radiographs 12/16/2017.  FINDINGS: Progressive degenerative change is present in the left humeral head. Large osteophytes are now present. Further sclerotic changes are noted at the glenoid. Calcific changes are again noted at the rotator cuff insertion. Atherosclerotic changes are noted in the aorta.  IMPRESSION: 1. Progressive degenerative changes of the left shoulder. 2. No acute fracture. 3. Chronic calcific tendinopathy of the rotator cuff insertion. 4. Aortic atherosclerosis.   Electronically Signed By: San Morelle M.D. On: 12/11/2019 10:43  -------------------------------  CT ANGIO CHEST AORTA W/CM & OR WO/CMPerformed 12/11/2019 Final result  Study Result CLINICAL DATA: Chest pain, shortness of breath, and dizziness. History of breast cancer.  EXAM: CT ANGIOGRAPHY CHEST WITH CONTRAST  TECHNIQUE: Multidetector CT imaging of the chest was performed using the standard protocol during bolus administration of intravenous contrast. Multiplanar CT image reconstructions and MIPs were obtained to evaluate the vascular anatomy.  CONTRAST: 36mL OMNIPAQUE IOHEXOL 350 MG/ML SOLN  COMPARISON: Chest radiographs 12/11/2019  FINDINGS: Cardiovascular: No thoracic aortic intramural hematoma is evident on precontrast images. There is thoracic aortic atherosclerosis without evidence of a dissection or aneurysm. The pulmonary arteries are not opacified on this study which was tailored for aortic evaluation. There is a normal variant aortic arch branching pattern with common origin of the brachiocephalic and left common carotid arteries. The heart is mildly enlarged. Coronary atherosclerosis is noted. There is no pericardial effusion.  Mediastinum/Nodes: History of left breast lumpectomy. Diffusely heterogeneous thyroid with asymmetric enlargement  of the left lobe extending into the superior mediastinum with mild rightward deviation and slight transverse narrowing of the trachea. No enlarged axillary, mediastinal, or hilar lymph nodes.  Lungs/Pleura: No pleural effusion or pneumothorax. Mild atelectasis or scarring in the lower lobes. No mass. Patent central airways.  Upper Abdomen: Atherosclerosis in the included upper portion of the abdominal aorta without dissection or aneurysm. Borderline to mild pancreatic ductal dilatation, similar to an 09/27/2019 abdominal CT.  Musculoskeletal: No suspicious osseous lesion. Advanced disc degeneration in the cervical and thoracic spine. Advanced right glenohumeral arthritis.  Review of the MIP images confirms the above findings.  IMPRESSION: 1. No thoracic aortic dissection or evidence of other acute abnormality in the chest. 2. Heterogeneous, enlarged thyroid with extension of the left lobe into the superior mediastinum. Recommend nonemergent thyroid ultrasound (ref: J Am Coll Radiol. 2015 Feb;12(2): 143-50). 3. Aortic Atherosclerosis (ICD10-I70.0).   Electronically Signed By: Logan Bores M.D. On: 12/11/2019 12:52    Results for orders placed or performed during the hospital encounter of 56/25/63  Basic metabolic panel  Result Value Ref Range   Sodium 138 135 - 145 mmol/L   Potassium 4.3 3.5 - 5.1 mmol/L   Chloride 99  98 - 111 mmol/L   CO2 27 22 - 32 mmol/L   Glucose, Bld 114 (H) 70 - 99 mg/dL   BUN 24 (H) 8 - 23 mg/dL   Creatinine, Ser 1.18 (H) 0.44 - 1.00 mg/dL   Calcium 9.5 8.9 - 10.3 mg/dL   GFR, Estimated 44 (L) >60 mL/min   Anion gap 12 5 - 15  CBC  Result Value Ref Range   WBC 6.7 4.0 - 10.5 K/uL   RBC 4.17 3.87 - 5.11 MIL/uL   Hemoglobin 11.4 (L) 12.0 - 15.0 g/dL   HCT 34.8 (L) 36 - 46 %   MCV 83.5 80.0 - 100.0 fL   MCH 27.3 26.0 - 34.0 pg   MCHC 32.8 30.0 - 36.0 g/dL   RDW 16.7 (H) 11.5 - 15.5 %   Platelets 297 150 - 400 K/uL   nRBC 0.0 0.0 - 0.2 %    CBG monitoring, ED  Result Value Ref Range   Glucose-Capillary 87 70 - 99 mg/dL   Comment 1 Notify RN    Comment 2 Document in Chart   Troponin I (High Sensitivity)  Result Value Ref Range   Troponin I (High Sensitivity) 8 <18 ng/L  Troponin I (High Sensitivity)  Result Value Ref Range   Troponin I (High Sensitivity) 9 <18 ng/L      Assessment & Plan:   Problem List Items Addressed This Visit    Aortic atherosclerosis ([place])    Other Visit Diagnoses    Thyroid enlargement    -  Primary   Relevant Orders   US THYROID   Acute pain of left shoulder          #L shoulder pain - RESOLVED ED reviewed Imaging and labs, unremarkable for MSK cause Conservative care resolved issue, Tylenol, rest  #Thyroid enlargement See CTA results above, incidental finding. Exam reassuring No lab on file, or TSH We will order Thyroid Ultrasound to be scheduled at [facility] - already scheduled for 12/25/19 after visit. Will follow up results and if abnormal we will order Thyroid panel and refer to Endocrinology vs ENT based on findings.  #Aortic Atherosclerosis Identified on CTA On ASA and Statin currently.  No orders of the defined types were placed in this encounter.     Follow up plan: Return in about 3 months (around 03/19/2020) for 3 month DM A1c, thyroid follow-up.   [name], [facility] 12/18/2019, 11:55 AM

## 2019-12-25 ENCOUNTER — Other Ambulatory Visit: Payer: Self-pay

## 2019-12-25 ENCOUNTER — Ambulatory Visit
Admission: RE | Admit: 2019-12-25 | Discharge: 2019-12-25 | Disposition: A | Discharge status: Home / Self Care | Payer: Medicare Other | Source: Ambulatory Visit | Attending: Family Medicine | Admitting: Family Medicine

## 2019-12-25 ENCOUNTER — Encounter: Payer: Self-pay | Admitting: Family Medicine

## 2019-12-25 ENCOUNTER — Other Ambulatory Visit: Payer: Self-pay | Admitting: Family Medicine

## 2019-12-25 DIAGNOSIS — E042 Nontoxic multinodular goiter: Secondary | ICD-10-CM

## 2019-12-25 DIAGNOSIS — E049 Nontoxic goiter, unspecified: Secondary | ICD-10-CM | POA: Diagnosis not present

## 2019-12-25 NOTE — Addendum Note (Signed)
Addended by: [name] on: 12/25/2019 05:57 PM   Modules accepted: Orders

## 2019-12-26 ENCOUNTER — Ambulatory Visit: Payer: Self-pay | Admitting: *Deleted

## 2019-12-26 NOTE — Telephone Encounter (Signed)
Pt called in for TSH results however there was not an interpretation of the results.   Agent to transfer call directly into office since someone there just tried to call her.

## 2020-01-01 ENCOUNTER — Other Ambulatory Visit: Payer: Medicare Other

## 2020-01-01 ENCOUNTER — Other Ambulatory Visit: Payer: Self-pay

## 2020-01-01 DIAGNOSIS — E042 Nontoxic multinodular goiter: Secondary | ICD-10-CM

## 2020-01-02 LAB — TSH: TSH: 1.32 mIU/L (ref 0.40–4.50)

## 2020-01-02 LAB — T4, FREE: Free T4: 1.2 ng/dL (ref 0.8–1.8)

## 2020-01-02 LAB — T3, FREE: T3, Free: 2.9 pg/mL (ref 2.3–4.2)

## 2020-01-03 ENCOUNTER — Ambulatory Visit: Payer: Medicare Other | Admitting: General Surgery

## 2020-01-10 ENCOUNTER — Ambulatory Visit: Payer: Medicare Other | Admitting: General Surgery

## 2020-01-15 ENCOUNTER — Encounter: Payer: Self-pay | Admitting: Family Medicine

## 2020-01-15 NOTE — Progress Notes (Signed)
Updated patients covid booster.  [place] provided information by fax

## 2020-01-17 ENCOUNTER — Ambulatory Visit: Payer: Medicare Other | Admitting: General Surgery

## 2020-02-05 ENCOUNTER — Ambulatory Visit: Payer: Medicare Other | Admitting: General Surgery

## 2020-02-07 ENCOUNTER — Other Ambulatory Visit: Payer: Self-pay

## 2020-02-07 ENCOUNTER — Encounter: Payer: Self-pay | Admitting: General Surgery

## 2020-02-07 ENCOUNTER — Ambulatory Visit (INDEPENDENT_AMBULATORY_CARE_PROVIDER_SITE_OTHER): Payer: Medicare Other | Admitting: General Surgery

## 2020-02-07 VITALS — BP 170/72 | HR 80 | Temp 98.0°F | Ht 60.0 in | Wt 141.0 lb

## 2020-02-07 DIAGNOSIS — E042 Nontoxic multinodular goiter: Secondary | ICD-10-CM | POA: Diagnosis not present

## 2020-02-07 DIAGNOSIS — R6889 Other general symptoms and signs: Secondary | ICD-10-CM | POA: Diagnosis not present

## 2020-02-07 NOTE — Progress Notes (Signed)
Patient ID: Ellen [name], female   DOB: 05-22-1929, 87 y.o.   MRN: [id number]  Chief Complaint  Patient presents with  . Goiter    HPI Ellen [name] is a 87 y.o. female.  She has been referred by her primary care provider, Dr. Parks Ranger, for further evaluation of a multinodular goiter.  She presented to the emergency department in early November 2021 complaining of shoulder and neck pain.  As part of her emergency room evaluation, she underwent a CT angiogram to evaluate for possible dissection.  No aortic dissection was identified, but she was found to have a large multinodular goiter causing mild rightward tracheal shift.  Subsequent ultrasound evaluation identified a number of thyroid nodules, the right midpole nodule meeting criteria for fine-needle aspiration biopsy.  Her primary care provider obtain thyroid function studies and has referred her for further evaluation and management.  She denies heart palpitations or hand tremors.  No heat or cold intolerance.  No dysphagia or dyspnea in the supine position.  She denies any changes in the texture of her hair or fingernails.  She does state that her skin is somewhat dry and itchy.  No diarrhea or constipation.  She does not report any voice changes.  She does state that she has to clear her throat frequently due to phlegm.  She denies any significant weight loss or weight gain.  She does have a history of breast irradiation for left breast cancer, but has not had any dedicated head or neck irradiation, nor any occupational exposure to ionizing radiation.  She denies any family history of thyroid problems.   Past Medical History:  Diagnosis Date  . Arthritis   . Cancer ([place]) 01/2018   left breast   . Cataract   . Chronic headaches   . CKD (chronic kidney disease)   . Diabetes mellitus without complication ([place])   . Diverticulitis   . Family history of ovarian cancer   . History of syncope   . Hyperlipidemia   . Hypertension   .  Multinodular goiter   . Personal history of radiation therapy   . Primary osteoarthritis of left knee   . Pulmonary hypertension ([place])     Past Surgical History:  Procedure Laterality Date  . ABDOMINAL HYSTERECTOMY    . APPENDECTOMY    . BREAST BIOPSY Left 01/03/2018   left breast stereo/x clip/positive  . BREAST LUMPECTOMY Left 01/12/2018  . BREAST LUMPECTOMY WITH NEEDLE LOCALIZATION Left 01/12/2018   Procedure: BREAST LUMPECTOMY WITH NEEDLE LOCALIZATION;  Surgeon: Jules Husbands, MD;  Location: [facility] ORS;  Service: General;  Laterality: Left;  . CHOLECYSTECTOMY    . COLON SURGERY    . COLOSTOMY  2008  . COLOSTOMY REVERSAL  2008  . EYE SURGERY    . REPLACEMENT TOTAL KNEE Right 2018  . SENTINEL NODE BIOPSY Left 03/07/2018   Procedure: SENTINEL NODE BIOPSY;  Surgeon: Jules Husbands, MD;  Location: [facility] ORS;  Service: General;  Laterality: Left;    Family History  Problem Relation Age of Onset  . Lung cancer Mother 34  . Diabetes Father   . Stroke Father   . Hypertension Maternal Grandmother   . Hypertension Maternal Uncle   . Hypertension Maternal Aunt   . Ovarian cancer Maternal Aunt 60  . Heart disease Daughter     Social History Social History   Tobacco Use  . Smoking status: Never Smoker  . Smokeless tobacco: Never Used  Vaping Use  . Vaping Use: Never used  Substance Use Topics  . Alcohol use: Never  . Drug use: Never    Allergies  Allergen Reactions  . Codeine Nausea Only  . Morphine And Related Nausea Only    Current Outpatient Medications  Medication Sig Dispense Refill  . acetaminophen (TYLENOL) 500 MG tablet Take 1,000 mg by mouth every 6 (six) hours as needed for moderate pain or headache.     Marland Kitchen amLODipine (NORVASC) 5 MG tablet TAKE 1 TABLET(5 MG) BY MOUTH DAILY 90 tablet 1  . aspirin 81 MG chewable tablet Chew 81 mg by mouth daily.     . Blood Glucose Monitoring Suppl (ONE TOUCH ULTRA 2) w/Device KIT Use to check blood sugar daily, up to 2 times if  need 1 each 0  . cloNIDine (CATAPRES) 0.1 MG tablet TAKE 1 TABLET BY MOUTH DAILY AS NEEDED FOR ELEVATED BLOOD PRESSURE GREATER THAN 180/100/HEADACHE 30 tablet 2  . gabapentin (NEURONTIN) 300 MG capsule TAKE 1 CAPSULE(300 MG) BY MOUTH IN THE MORNING AND AT BEDTIME 180 capsule 2  . Garlic 761 MG TABS Take by mouth.    . Lancets (ONETOUCH ULTRASOFT) lancets Use to check blood sugar daily, up to 2 times if need 100 each 3  . lisinopril-hydrochlorothiazide (ZESTORETIC) 20-12.5 MG tablet TAKE 1 TABLET BY MOUTH DAILY 90 tablet 1  . Multiple Vitamins-Minerals (CERTAVITE SENIOR/ANTIOXIDANT) TABS Take by mouth.    . ONE TOUCH ULTRA TEST test strip Use to check blood sugar daily, up to 2 times if need 100 each 3  . pravastatin (PRAVACHOL) 20 MG tablet TAKE 1 TABLET(20 MG) BY MOUTH AT BEDTIME 90 tablet 3  . triamcinolone cream (KENALOG) 0.5 % Apply 1 application topically 2 (two) times daily. To affected areas, for up to 2 weeks. 30 g 0   No current facility-administered medications for this visit.    Review of Systems Review of Systems  Neurological: Positive for numbness.       She states that her right hand is constantly cold and numb.  All other systems reviewed and are negative. Or as discussed in the history of present illness.  Blood pressure (!) 170/72, pulse 80, temperature 98 F (36.7 C), height 5' (1.524 m), weight 141 lb (64 kg), SpO2 98 %. Body mass index is 27.54 kg/m.  Physical Exam Physical Exam Constitutional:      General: She is not in acute distress.    Appearance: Normal appearance.  HENT:     Head: Normocephalic and atraumatic.     Nose:     Comments: Covered with a mask    Mouth/Throat:     Comments: Covered with a mask.  Her vocal quality is somewhat high-pitched and hypophonic, but she states this is normal for her and that she is able to sing and project her voice without difficulty. Eyes:     General: No scleral icterus.       Right eye: No discharge.         Left eye: No discharge.     Comments: No proptosis or exophthalmos.  Neck:     Comments: The thyroid is enlarged bilaterally, left greater than right.  No discrete masses are palpable.  The gland was freely with deglutition.  The palpable portion of her trachea is midline. Cardiovascular:     Rate and Rhythm: Normal rate and regular rhythm.     Heart sounds: Murmur heard.      Comments: 1 out of 6 systolic ejection murmur heard best in the right second  intercostal space. Pulmonary:     Effort: Pulmonary effort is normal.     Breath sounds: Normal breath sounds.  Abdominal:     General: Bowel sounds are normal.     Palpations: Abdomen is soft.  Genitourinary:    Comments: Deferred Musculoskeletal:        General: No swelling or tenderness.  Lymphadenopathy:     Cervical: No cervical adenopathy.  Skin:    General: Skin is warm and dry.  Neurological:     General: No focal deficit present.     Mental Status: She is alert and oriented to person, place, and time.  Psychiatric:        Mood and Affect: Mood normal.        Behavior: Behavior normal.        Thought Content: Thought content normal.     Data Reviewed Results for RAHIMA, FLEISHMAN (MRN [id number]) as of 02/07/2020 16:04  Ref. Range 01/01/2020 08:41  TSH Latest Ref Range: 0.40 - 4.50 mIU/L 1.32  Triiodothyronine,Free,Serum Latest Ref Range: 2.3 - 4.2 pg/mL 2.9  T4,Free(Direct) Latest Ref Range: 0.8 - 1.8 ng/dL 1.2  These labs demonstrate normal thyroid function.  I personally reviewed the CT angiogram and thyroid ultrasound and concur with the radiologist impressions copied here:  CLINICAL DATA:  Chest pain, shortness of breath, and dizziness. History of breast cancer.  EXAM: CT ANGIOGRAPHY CHEST WITH CONTRAST  TECHNIQUE: Multidetector CT imaging of the chest was performed using the standard protocol during bolus administration of intravenous contrast. Multiplanar CT image reconstructions and MIPs were obtained  to evaluate the vascular anatomy.  CONTRAST:  57m OMNIPAQUE IOHEXOL 350 MG/ML SOLN  COMPARISON:  Chest radiographs 12/11/2019  FINDINGS: Cardiovascular: No thoracic aortic intramural hematoma is evident on precontrast images. There is thoracic aortic atherosclerosis without evidence of a dissection or aneurysm. The pulmonary arteries are not opacified on this study which was tailored for aortic evaluation. There is a normal variant aortic arch branching pattern with common origin of the brachiocephalic and left common carotid arteries. The heart is mildly enlarged. Coronary atherosclerosis is noted. There is no pericardial effusion.  Mediastinum/Nodes: History of left breast lumpectomy. Diffusely heterogeneous thyroid with asymmetric enlargement of the left lobe extending into the superior mediastinum with mild rightward deviation and slight transverse narrowing of the trachea. No enlarged axillary, mediastinal, or hilar lymph nodes.  Lungs/Pleura: No pleural effusion or pneumothorax. Mild atelectasis or scarring in the lower lobes. No mass. Patent central airways.  Upper Abdomen: Atherosclerosis in the included upper portion of the abdominal aorta without dissection or aneurysm. Borderline to mild pancreatic ductal dilatation, similar to an 09/27/2019 abdominal CT.  Musculoskeletal: No suspicious osseous lesion. Advanced disc degeneration in the cervical and thoracic spine. Advanced right glenohumeral arthritis.  Review of the MIP images confirms the above findings.  IMPRESSION: 1. No thoracic aortic dissection or evidence of other acute abnormality in the chest. 2. Heterogeneous, enlarged thyroid with extension of the left lobe into the superior mediastinum. Recommend nonemergent thyroid ultrasound (ref: J Am Coll Radiol. 2015 Feb;12(2): 143-50). 3. Aortic Atherosclerosis (ICD10-I70.0).  CLINICAL DATA:  87year old female with thyroid enlargement on  prior CT  EXAM: THYROID ULTRASOUND  TECHNIQUE: Ultrasound examination of the thyroid gland and adjacent soft tissues was performed.  COMPARISON:  CT 12/11/2019  FINDINGS: Parenchymal Echotexture: Moderately heterogenous  Isthmus: 0.7 cm  Right lobe: 5.9 cm x 2.6 cm x 1.8 cm  Left lobe: 5.6 cm x 3.7 cm x 4.4 cm  _________________________________________________________  Estimated total number of nodules >/= 1 cm: 5  Number of spongiform nodules >/=  2 cm not described below (TR1): 0  Number of mixed cystic and solid nodules >/= 1.5 cm not described below ([place]): 0  _________________________________________________________  Nodule # 1:  Location: Right; Mid  Maximum size: 1.8 cm; Other 2 dimensions: 1.3 cm x 1.1 cm  Composition: solid/almost completely solid (2)  Echogenicity: isoechoic (1)  Shape: not taller-than-wide (0)  Margins: ill-defined (0)  Echogenic foci: none (0)  ACR TI-RADS total points: 3.  ACR TI-RADS risk category: TR3 (3 points).  ACR TI-RADS recommendations:  Nodule meets criteria for surveillance  _________________________________________________________  Nodule # 2:  Location: Right; Inferior  Maximum size: 3.3 cm; Other 2 dimensions:  2.3 cm x 2.6 cm  Composition: cannot determine (2)  Echogenicity: isoechoic (1)  Shape: not taller-than-wide (0)  Margins: ill-defined (0)  Echogenic foci: none (0)  ACR TI-RADS total points: 3.  ACR TI-RADS risk category: TR3 (3 points).  ACR TI-RADS recommendations:  Nodule meets criteria for biopsy  _________________________________________________________  Nodule # 3:  Location: Left; Mid  Maximum size: 2.3 cm; Other 2 dimensions: 2.2 cm x 1.5 cm  Composition: solid/almost completely solid (2)  Echogenicity: isoechoic (1)  Shape: not taller-than-wide (0)  Margins: smooth (0)  Echogenic foci: none (0)  ACR TI-RADS total  points: 3.  ACR TI-RADS risk category: TR3 (3 points).  ACR TI-RADS recommendations:  Nodule meets criteria for surveillance  _________________________________________________________  Nodule # 4:  Location: Left; Inferior  Maximum size: 1.6 cm; Other 2 dimensions: 1.3 cm x 1.5 cm  Composition: solid/almost completely solid (2)  Echogenicity: isoechoic (1)  Shape: not taller-than-wide (0)  Margins: ill-defined (0)  Echogenic foci: none (0)  ACR TI-RADS total points: 3.  ACR TI-RADS risk category: TR3 (3 points).  ACR TI-RADS recommendations:  Nodule meets criteria for surveillance  _________________________________________________________  Nodule # 5:  Location: Left; Inferior  Maximum size: 1.8 cm; Other 2 dimensions: 1.7 cm x 1.6 cm  Composition: solid/almost completely solid (2)  Echogenicity: isoechoic (1)  Shape: not taller-than-wide (0)  Margins: ill-defined (0)  Echogenic foci: none (0)  ACR TI-RADS total points: 3.  ACR TI-RADS risk category: TR3 (3 points).  ACR TI-RADS recommendations:  Nodule meets criteria for surveillance  _________________________________________________________  No adenopathy  IMPRESSION: Multinodular thyroid goiter.  Right mid thyroid nodule (labeled 1, TR 3, 1.8 cm) left mid thyroid nodule (labeled 3, 2.3 cm, TR 3) left inferior thyroid nodule (labeled 4, 1.6 cm, TR 3) left inferior thyroid nodule (labeled 5, 1.8 cm, TR 3) all meet criteria for surveillance, as designated by the newly established ACR TI-RADS criteria. Surveillance ultrasound study recommended to be performed annually up to 5 years.  Right inferior thyroid nodule (labeled 2, TR 3, 3.3 cm) meets criteria for biopsy, given the size of the TR 3 category nodule. Given the appearance of the remainder of the above nodules and the indication for surveillance, either surveillance or referral for biopsy may be  considered.  Assessment This is an 87 year old woman with a multinodular goiter with extension of the left lobe into the upper mediastinum.  This does cause some rightward tracheal deviation, however the patient denies any significant compressive symptoms.  There is a nodule in the right lobe, however, that does meet criteria for biopsy.  I had an extensive discussion with Ms. Hollywood regarding this nodule and whether or not she would take aggressive action based upon the results of  a biopsy.  Specifically, I discussed with her that the majority of thyroid nodules are benign, but based upon the way they look on ultrasound, sometimes we recommend that they be biopsied to either confirm that they are benign or to determine whether or not they could be malignant.  I spent time discussing the natural course of thyroid cancer, specifically that it is generally a very indolent process and very seldom results in the premature death of a patient.  I asked her if she would want to undergo major thyroid surgery if cancer were discovered with in the nodule and she said that she would.  Despite her advanced age, she is rather spry and I think she is likely to tolerate surgical intervention, if that is something we end up pursuing.  Plan I offered a fine-needle aspiration biopsy of the thyroid today in clinic, however Ms. Hinzman said that she had other obligations this afternoon and would like to return for a separate, procedure only appointment.  This has been scheduled for next week and we look forward to seeing her then.  I spent greater than 50% of this 45-minute visit in counseling and coordination of patient care.    Fredirick Maudlin 02/07/2020, 3:53 PM

## 2020-02-07 NOTE — Patient Instructions (Addendum)
We will have you come back to have your thyroid nodule biopsied.    Thyroid Needle Biopsy  Thyroid needle biopsy is a procedure to remove small samples of tissue or fluid from the thyroid gland. The samples are then examined under a microscope. The thyroid is a gland in the lower front area of the neck. It produces hormones that affect many important body processes, including growth and development, body temperature, and how the body uses food for energy (metabolism). This procedure is often done to help diagnose cancer, infection, or other problems with the thyroid. During this procedure, a thin needle (fine needle) is inserted through the skin and into the thyroid gland. This is less invasive than a procedure in which an incision is made over the thyroid (open thyroid biopsy). Sometimes, an open thyroid biopsy may be done during a different surgery, such as surgery to remove a part or a whole section (lobe) of the thyroid gland (open lobectomy). Tell a health care provider about:  Any allergies you have.  All medicines you are taking, including vitamins, herbs, eye drops, creams, and over-the-counter medicines.  Any problems you or family members have had with anesthetic medicines.  Any blood disorders you have.  Any surgeries you have had.  Any medical conditions you have.  Whether you are pregnant or may be pregnant. What are the risks? Generally, this is a safe procedure. However, problems may occur, including:  Infection.  Bleeding.  Allergic reactions to medicines.  Damage to nerves or blood vessels in the neck. What happens before the procedure? Medicines  Ask your health care provider about: ? Changing or stopping your regular medicines. This is especially important if you are taking diabetes medicines or blood thinners. ? Taking medicines such as aspirin and ibuprofen. These medicines can thin your blood. Do not take these medicines unless your health care provider  tells you to take them. ? Taking over-the-counter medicines, vitamins, herbs, and supplements. General instructions  You may have blood tests.  You may have an ultrasound before or during the needle biopsy. What happens during the procedure?  You will be asked to lie on your back with your head tipped backward to extend your neck. You may be asked to avoid coughing, talking, swallowing, or making sounds during some parts of the procedure.  To lower your risk of infection: ? Your health care team will wash or sanitize their hands. ? The skin over your thyroid will be cleaned with a germ-killing (antiseptic) solution.  A local anesthetic (lidocaine) may be injected into the skin over your thyroid, to numb the area.  An ultrasound may be done to help guide the needle to the desired area of your thyroid.  A fine needle will be inserted into your thyroid. The needle will be used to remove tissue or fluid samples as needed. The samples will be sent to a lab for examination.  The needle will be removed.  Pressure may be applied to your neck to reduce swelling and stop bleeding. The procedure may vary among health care providers and hospitals. What happens after the procedure?  It is up to you to get the results of your procedure. Ask your health care provider, or the department that is doing the procedure, when your results will be ready. Summary  Thyroid needle biopsy is a procedure to remove small samples of tissue or fluid from the thyroid gland.  During this procedure, a thin needle (fine needle) is inserted through the skin and  into the thyroid gland. This is less invasive than a procedure in which an incision is made over the thyroid (open thyroid biopsy).  You will be asked to lie on your back with your head tipped backward to extend your neck. You may be asked to avoid coughing, talking, swallowing, or making sounds during some parts of the procedure. This information is not  intended to replace advice given to you by your health care provider. Make sure you discuss any questions you have with your health care provider. Document Revised: 12/31/2016 Document Reviewed: 11/01/2016 Elsevier Patient Education  2020 [employer].

## 2020-02-11 ENCOUNTER — Telehealth: Payer: Self-pay

## 2020-02-11 ENCOUNTER — Telehealth: Payer: Self-pay | Admitting: General Practice

## 2020-02-11 NOTE — Telephone Encounter (Signed)
  Chronic Care Management   Outreach Note  02/11/2020 Name: Ellen [name] MRN: [id number] DOB: 02-08-1929  Referred by: [name], DO Reason for referral : Appointment (RNCM: Follow up call for Chronic Disease Management and Care Coordination Needs )   An unsuccessful telephone outreach was attempted today. The patient was referred to the case management team for assistance with care management and care coordination.   Follow Up Plan: A HIPAA compliant phone message was left for the patient providing contact information and requesting a return call.   Noreene Larsson RN, MSN, [facility] Mobile: [phone]

## 2020-02-13 ENCOUNTER — Telehealth: Payer: Self-pay

## 2020-02-13 NOTE — Chronic Care Management (AMB) (Signed)
  Care Management   Note  02/13/2020 Name: Ellen [name] MRN: [id number] DOB: [birth year]  Ellen [name] is a 87 y.o. year old female who is a primary care patient of [name], DO and is actively engaged with the care management team. I reached out to [employer] by phone today to assist with re-scheduling a follow up visit with the RN Case Manager  Follow up plan: Unsuccessful telephone outreach attempt made. A HIPAA compliant phone message was left for the patient providing contact information and requesting a return call.  The care management team will reach out to the patient again over the next 7 days.  If patient returns call to provider office, please advise to call [place]  at [place], [place], [place], [place] [address] Direct Dial: [phone] [email or web address] Website: [email or web address]

## 2020-02-14 ENCOUNTER — Encounter: Payer: Self-pay | Admitting: General Surgery

## 2020-02-14 ENCOUNTER — Ambulatory Visit (INDEPENDENT_AMBULATORY_CARE_PROVIDER_SITE_OTHER): Payer: Medicare Other

## 2020-02-14 ENCOUNTER — Ambulatory Visit: Payer: Self-pay

## 2020-02-14 ENCOUNTER — Other Ambulatory Visit: Payer: Self-pay | Admitting: General Surgery

## 2020-02-14 ENCOUNTER — Ambulatory Visit: Payer: Medicare Other | Admitting: General Surgery

## 2020-02-14 ENCOUNTER — Other Ambulatory Visit: Payer: Self-pay

## 2020-02-14 DIAGNOSIS — E042 Nontoxic multinodular goiter: Secondary | ICD-10-CM | POA: Diagnosis not present

## 2020-02-14 DIAGNOSIS — E041 Nontoxic single thyroid nodule: Secondary | ICD-10-CM | POA: Diagnosis not present

## 2020-02-14 DIAGNOSIS — D440 Neoplasm of uncertain behavior of thyroid gland: Secondary | ICD-10-CM | POA: Diagnosis not present

## 2020-02-14 NOTE — Progress Notes (Signed)
Ellen [name] is here today for her thyroid biopsy.  This was performed.  Please see radiology graphic documentation for full details.  I will contact her with the results.

## 2020-02-14 NOTE — Patient Instructions (Signed)
Keep an ice pack applied to your neck several times today to help reduce swelling at 15 minutes at a time. We will call you with the results and schedule a follow  appointment if necessary at that time.

## 2020-02-19 ENCOUNTER — Other Ambulatory Visit: Payer: Self-pay

## 2020-02-19 ENCOUNTER — Ambulatory Visit (INDEPENDENT_AMBULATORY_CARE_PROVIDER_SITE_OTHER): Payer: Medicare Other | Admitting: Family Medicine

## 2020-02-19 DIAGNOSIS — Z5329 Procedure and treatment not carried out because of patient's decision for other reasons: Secondary | ICD-10-CM

## 2020-02-19 DIAGNOSIS — Z91199 Patient's noncompliance with other medical treatment and regimen due to unspecified reason: Secondary | ICD-10-CM

## 2020-02-19 NOTE — Progress Notes (Signed)
Patient was unable to be reached by phone for her virtual visit today. Considered No Show.  Ellen [name], [facility] 02/19/2020, 5:10 PM

## 2020-02-20 ENCOUNTER — Telehealth: Payer: Self-pay | Admitting: General Surgery

## 2020-02-20 NOTE — Telephone Encounter (Signed)
Error

## 2020-02-20 NOTE — Chronic Care Management (AMB) (Signed)
  Care Management   Note  02/20/2020 Name: Astraea Gaughran MRN: [id number] DOB: [birth year]  Tenley Winward is a 87 y.o. year old female who is a primary care patient of [name], DO and is actively engaged with the care management team. I reached out to [employer] by phone today to assist with re-scheduling a follow up visit with the RN Case Manager  Follow up plan: Unsuccessful telephone outreach attempt made. The care management team will reach out to the patient again over the next 7 days.  If patient returns call to provider office, please advise to call [place]  at [place], [place], [place], [place] [address] Direct Dial: [phone] [email or web address]: [email or web address]

## 2020-02-25 ENCOUNTER — Telehealth: Payer: Self-pay | Admitting: General Surgery

## 2020-02-25 NOTE — Telephone Encounter (Signed)
Tried to reach patient to discuss thyroid biopsy results, but call went straight to VM and mailbox is full.  Will try again another time.

## 2020-02-26 ENCOUNTER — Telehealth: Payer: Self-pay | Admitting: General Surgery

## 2020-02-26 NOTE — Telephone Encounter (Signed)
Tried again to reach patient regarding FNABx results. Her phone went straight to VM, but mailbox was full. Was able to leave message on daughter, [name] Craige's VM for her to return my call

## 2020-02-27 ENCOUNTER — Telehealth: Payer: Self-pay | Admitting: General Surgery

## 2020-02-27 NOTE — Chronic Care Management (AMB) (Signed)
  Care Management   Note  02/27/2020 Name: Ellen [name] MRN: [id number] DOB: February 02, 1930  Ellen [name] is a 87 y.o. year old female who is a primary care patient of [name], DO and is actively engaged with the care management team. I reached out to [employer] by phone today to assist with re-scheduling a follow up visit with the RN Case Manager  Follow up plan: Unable to make contact on outreach attempts x 3. PCP Dr. Parks Ranger notified via routed documentation in medical record.   Noreene Larsson, [place], [place], [place] [address] Direct Dial: [phone] [email or web address]: [email or web address]

## 2020-02-27 NOTE — Telephone Encounter (Signed)
Patient is calling said she received a call yesterday and was calling back today, said she could be reached at [phone]. Please call patient and advise.

## 2020-02-27 NOTE — Telephone Encounter (Signed)
3rd unsuccessful outreach  

## 2020-02-28 ENCOUNTER — Telehealth: Payer: Self-pay

## 2020-02-28 NOTE — Telephone Encounter (Signed)
I called and spoke with the patient daughter [name]  An gave her [facility], Dr. Celine Ahr office phone # to call and f/u on the biopsy results.

## 2020-02-28 NOTE — Telephone Encounter (Signed)
Discussed the FNA results with Ms. Carlini and her daughter, [name].  Follicular lesion of undetermined significance (Bethesda category 3) the specimen will be sent for ThryoSeq.  I will call them when those results are back, which is generally in 2 weeks or a little bit more.

## 2020-02-28 NOTE — Telephone Encounter (Signed)
Copied from [place] [id number]. Topic: General - Other >> Feb 28, 2020 10:43 AM Antonieta Iba C wrote: Reason for CRM: pt called in for her biopsy results. Pt says that she never received them.   Please assist further. [phone]

## 2020-03-10 ENCOUNTER — Telehealth: Payer: Self-pay | Admitting: General Surgery

## 2020-03-10 NOTE — Telephone Encounter (Signed)
Tried to reach patient to discuss ThyroSeq results. Her phone went straight to VM, but the mailbox was full. I then called her daughter, Ms. Claige, but had to leave a message on her VM for her to call back.

## 2020-03-12 ENCOUNTER — Telehealth: Payer: Self-pay | Admitting: General Surgery

## 2020-03-12 NOTE — Telephone Encounter (Signed)
Discussed ThyroSeq results with patient and her daughter. Negative for deleterious mutations; no surgery indicated.  As she is asymptomatic from her goiter, we have decided to not pursue surgery.

## 2020-03-18 ENCOUNTER — Ambulatory Visit: Payer: Medicare Other | Admitting: Unknown Physician Specialty

## 2020-03-22 DIAGNOSIS — M25561 Pain in right knee: Secondary | ICD-10-CM | POA: Diagnosis not present

## 2020-03-22 DIAGNOSIS — S8991XA Unspecified injury of right lower leg, initial encounter: Secondary | ICD-10-CM | POA: Diagnosis not present

## 2020-03-22 DIAGNOSIS — Z79899 Other long term (current) drug therapy: Secondary | ICD-10-CM | POA: Diagnosis not present

## 2020-03-22 DIAGNOSIS — Z7982 Long term (current) use of aspirin: Secondary | ICD-10-CM | POA: Diagnosis not present

## 2020-03-22 DIAGNOSIS — M25461 Effusion, right knee: Secondary | ICD-10-CM | POA: Diagnosis not present

## 2020-03-22 DIAGNOSIS — Z96659 Presence of unspecified artificial knee joint: Secondary | ICD-10-CM | POA: Diagnosis not present

## 2020-03-22 DIAGNOSIS — Z885 Allergy status to narcotic agent status: Secondary | ICD-10-CM | POA: Diagnosis not present

## 2020-03-22 DIAGNOSIS — I10 Essential (primary) hypertension: Secondary | ICD-10-CM | POA: Diagnosis not present

## 2020-03-22 DIAGNOSIS — Z96651 Presence of right artificial knee joint: Secondary | ICD-10-CM | POA: Diagnosis not present

## 2020-04-08 ENCOUNTER — Ambulatory Visit: Payer: Self-pay | Admitting: *Deleted

## 2020-04-08 NOTE — Telephone Encounter (Signed)
Patient is calling to report worsening symptoms of weakness and numbness in R shoulder and hand. Patient states has been seen for this before and had therapy. Patient states her symptoms are getting worse- especially with increased usage- decreased range of motion.  Reason for Disposition . [1] Numbness or tingling in one or both hands AND [2] is a chronic symptom (recurrent or ongoing AND present > 4 weeks)  Answer Assessment - Initial Assessment Questions 1. SYMPTOM: "What is the main symptom you are concerned about?" (e.g., weakness, numbness)     R hand numbness and coldness in hand 2. ONSET: "When did this start?" (minutes, hours, days; while sleeping)     Over 1 month- symptoms are getting worse- was intermittent- evary 2-3 days 3. LAST NORMAL: "When was the last time you were normal (no symptoms)?"     Over 1 month 4. PATTERN "Does this come and go, or has it been constant since it started?"  "Is it present now?"     Comes and goes, present now 5. CARDIAC SYMPTOMS: "Have you had any of the following symptoms: chest pain, difficulty breathing, palpitations?"     no 6. NEUROLOGIC SYMPTOMS: "Have you had any of the following symptoms: headache, dizziness, vision loss, double vision, changes in speech, unsteady on your feet?"     no 7. OTHER SYMPTOMS: "Do you have any other symptoms?"      Shoulder to hand- gets worse with increased usage 8. PREGNANCY: "Is there any chance you are pregnant?" "When was your last menstrual period?"     n/a  Protocols used: NEUROLOGIC DEFICIT-A-AH

## 2020-04-09 ENCOUNTER — Ambulatory Visit: Payer: Medicare Other | Admitting: Family Medicine

## 2020-04-09 ENCOUNTER — Ambulatory Visit (INDEPENDENT_AMBULATORY_CARE_PROVIDER_SITE_OTHER): Payer: Medicare Other | Admitting: Family Medicine

## 2020-04-09 ENCOUNTER — Other Ambulatory Visit: Payer: Self-pay

## 2020-04-09 ENCOUNTER — Encounter: Payer: Self-pay | Admitting: Family Medicine

## 2020-04-09 VITALS — BP 161/62 | HR 63 | Ht 62.0 in | Wt 139.0 lb

## 2020-04-09 DIAGNOSIS — M25561 Pain in right knee: Secondary | ICD-10-CM | POA: Diagnosis not present

## 2020-04-09 DIAGNOSIS — M12811 Other specific arthropathies, not elsewhere classified, right shoulder: Secondary | ICD-10-CM

## 2020-04-09 DIAGNOSIS — M1711 Unilateral primary osteoarthritis, right knee: Secondary | ICD-10-CM

## 2020-04-09 DIAGNOSIS — M25512 Pain in left shoulder: Secondary | ICD-10-CM

## 2020-04-09 DIAGNOSIS — G8929 Other chronic pain: Secondary | ICD-10-CM

## 2020-04-09 DIAGNOSIS — M25511 Pain in right shoulder: Secondary | ICD-10-CM | POA: Diagnosis not present

## 2020-04-09 MED ORDER — BACLOFEN 10 MG PO TABS: 5.0000 mg | ORAL_TABLET | Freq: Three times a day (TID) | ORAL | 2 refills | Status: DC | PRN

## 2020-04-09 MED ORDER — DICLOFENAC SODIUM 1 % EX GEL: 2.0000 g | Freq: Four times a day (QID) | CUTANEOUS | 2 refills | Status: DC | PRN

## 2020-04-09 NOTE — Progress Notes (Signed)
Subjective:    Patient ID: Ellen [name], female    DOB: 10-Jun-1929, 87 y.o.   MRN: [id number]  Ellen [name] is a 87 y.o. female presenting on 04/09/2020 for hand numbness (shoul) and Shoulder Pain   HPI   Chronic Bilateral Shoulder Pain / Left Rotator Cuff Arthropathy Background history of prior rotator cuff injury bilateral. She has seen Orthopedic years ago and advised rotator cuff shoulder joint replacement, bone on bone arthritis, she has declined surgery. In meantime, she has done steroid injections in shoulders, we have done some here over the past 2-3 years as well. Now worsening progression of shoulder problem. She has had difficulty moving her left shoulder up, she can lift it with her R arm. Admits throbbing pain. - Taking Tylenol Ext Str 500mg  x 2 per dose up to 3 times a day, some relief - Tried muscle rub on it - Denies any bruising, redness, swelling, numbness, tingling, weakness  She takes ASA. Not taking NSAIDs Also would like something for knee, she uses knee sleeve for compression Not tried topical NSAID yet   ED FOLLOW-UP VISIT  Hospital/Location: [facility] Date of ED Visit: 03/22/20  Reason for Presenting to ED: R Knee Pain Primary (+Secondary) Diagnosis: R Knee Pain Injury / Fall  FOLLOW-UP  - ED provider note and record have been reviewed - Patient presents today about 2-3 weeks after recent ED visit. Brief summary of recent course, patient had symptoms of mechanical fall at home and contusion to knee, she had x-ray without acute injury, has arthroplasty in place hardware, was advised ortho she did not plan to go    Depression screen [facility] 2/9 04/23/2019 09/21/2018 04/12/2018  Decreased Interest 0 0 0  Down, Depressed, Hopeless 0 0 0  PHQ - 2 Score 0 0 0    Social History   Tobacco Use  . Smoking status: Never Smoker  . Smokeless tobacco: Never Used  Vaping Use  . Vaping Use: Never used  Substance Use Topics  . Alcohol use: Never   . Drug use: Never    Review of Systems Per HPI unless specifically indicated above     Objective:    BP (!) 161/62   Pulse 63   Ht 5\' 2"  (1.575 m)   Wt 139 lb (63 kg)   SpO2 100%   BMI 25.42 kg/m   Wt Readings from Last 3 Encounters:  04/09/20 139 lb (63 kg)  02/07/20 141 lb (64 kg)  12/18/19 137 lb (62.1 kg)    Physical Exam Vitals and nursing note reviewed.  Constitutional:      General: She is not in acute distress.    Appearance: She is well-developed and well-nourished. She is not diaphoretic.     Comments: Well-appearing, comfortable, cooperative  HENT:     Head: Normocephalic and atraumatic.     Mouth/Throat:     Mouth: Oropharynx is clear and moist.  Eyes:     General:        Right eye: No discharge.        Left eye: No discharge.     Conjunctiva/sclera: Conjunctivae normal.  Cardiovascular:     Rate and Rhythm: Normal rate.  Pulmonary:     Effort: Pulmonary effort is normal.  Musculoskeletal:        General: No edema.     Comments: Right upper extremity significantly limited range of motion forward flexion - cannot lift up requires left arm to lift her right arm up, and  difficulty with abduction/int rotation as well. - Significant Right hand grip weakness 3/5 - unable to fully test R Shoulder rotator cuff testing.   Skin:    General: Skin is warm and dry.     Findings: No erythema or rash.  Neurological:     Mental Status: She is alert and oriented to person, place, and time.  Psychiatric:        Mood and Affect: Mood and affect normal.        Behavior: Behavior normal.     Comments: Well groomed, good eye contact, normal speech and thoughts      I have personally reviewed the radiology report from 03/22/20 X-ray.  Procedure Note  Volanda Napoleon, MD - 03/22/2020  Formatting of this note might be different from the original.  Exam: Right Knee   History: Pain   Technique: 4 views   Comparison: 06/21/2019   Findings:  Postsurgical changes of prior total knee arthroplasty. The hardware components appear well seated without evidence of hardware fracture or perihardware lucency. Alignment is anatomic. There is no radiographic evidence of acute fracture or focal destructive osseous lesion. Small joint effusion. Scattered vascular calcifications.   IMPRESSION:   1.Status post left hemiarthroplasty. No evidence of hardware complication.   2.No acute abnormality.   Signed (Electronic Signature): 03/22/2020 10:17 AM  Signed By: Gillian Shields, MD Exam End: 03/22/20 9:51 AM   Specimen Collected: 03/22/20 10:16 AM Last Resulted: 03/22/20 10:17 AM  Received From: [facility]  Result Received: 04/08/20 11:30 AM     Results for orders placed or performed in visit on 01/01/20  T3, free  Result Value Ref Range   T3, Free 2.9 2.3 - 4.2 pg/mL  T4, free  Result Value Ref Range   Free T4 1.2 0.8 - 1.8 ng/dL  TSH  Result Value Ref Range   TSH 1.32 0.40 - 4.50 mIU/L      Assessment & Plan:   Problem List Items Addressed This Visit    Rotator cuff arthropathy of right shoulder - Primary   Relevant Medications   diclofenac Sodium (VOLTAREN) 1 % GEL   baclofen (LIORESAL) 10 MG tablet   Chronic pain of both shoulders   Relevant Medications   diclofenac Sodium (VOLTAREN) 1 % GEL   baclofen (LIORESAL) 10 MG tablet    Other Visit Diagnoses    Acute pain of right knee       Relevant Medications   diclofenac Sodium (VOLTAREN) 1 % GEL   Primary osteoarthritis of right knee       Relevant Medications   diclofenac Sodium (VOLTAREN) 1 % GEL   baclofen (LIORESAL) 10 MG tablet      Consistent with chronic bilateral-shoulder pain with rotator cuff dysfunction Known older injury prior diagnosis OA/DJD shoulders and rotator cuff injury Progression of known issues with R shoulder ROM dramatically reduced still In past orthopedic eval and steroid injections temporary relief  Discussed difficulty with treating  her current symptoms given advanced known underlying condition, advised may benefit from return to orthopedic consultation in future, as medications can only help improve her symptoms to some degree but unfortunately her function in R shoulder is going to be limited.  Plan: Limit NSAIDs orally Start topical Diclofenac (Voltaren) 1-4 times daily PRN, sample given Rx Baclofen 5-10mg  TID PRN caution sedation Continue Tylenol Ext Str 1g TID PRN Relative rest but keep shoulder mobile, demonstrated ROM exercises, avoid heavy lifting May try heating pad PRN Follow-up 4-6 weeks  if not improved for re-evaluation, consider referral to Physical Therapy, X-rays, and or subacromial steroid injection vs ORTHOPEDIC referral.    Meds ordered this encounter  Medications  . diclofenac Sodium (VOLTAREN) 1 % GEL    Sig: Apply 2 g topically 4 (four) times daily as needed (arthritis knee, elbow).    Dispense:  100 g    Refill:  2  . baclofen (LIORESAL) 10 MG tablet    Sig: Take 0.5-1 tablets (5-10 mg total) by mouth 3 (three) times daily as needed for muscle spasms.    Dispense:  60 each    Refill:  2      Follow up plan: Return in about 6 weeks (around 05/21/2020), or if symptoms worsen or fail to improve, for 6 week follow-up if Shoulder / Knee pain not improving.   [name], [facility] 04/09/2020, 2:46 PM

## 2020-04-09 NOTE — Patient Instructions (Addendum)
Thank you for coming to the office today.  Rotator cuff injury is impacting your range of motion, and likely pinching on nerve causing weakness in the arm and grip.  Unfortunately it is difficult to fix without procedures injection or surgery  For now try muscle relaxant and topical anti inflammatory cream  Voltaren (Diclofenac) topical pain relief / anti inflammatory use up to 4 times a day as needed on shoulder elbow and knee  Recommend to start taking Tylenol Extra Strength 500mg  tabs - take 1 to 2 tabs per dose (max 1000mg ) every 6-8 hours for pain (take regularly, don't skip a dose for next 7 days), max 24 hour daily dose is 6 tablets or 3000mg . In the future you can repeat the same everyday Tylenol course for 1-2 weeks at a time.   Start taking Baclofen (Lioresal) 10mg  (muscle relaxant) - start with half (cut) to one whole pill at night as needed for next 1-3 nights (may make you drowsy, caution with driving) see how it affects you, then if tolerated increase to one pill 2 to 3 times a day or (every 8 hours as needed)  Notify us if you want to pursue shoulder injection or refer to Orthopedic   Please schedule a Follow-up Appointment to: Return in about 6 weeks (around 05/21/2020), or if symptoms worsen or fail to improve, for 6 week follow-up if Shoulder / Knee pain not improving.  If you have any other questions or concerns, please feel free to call the office or send a message through [place]. You may also schedule an earlier appointment if necessary.  Additionally, you may be receiving a survey about your experience at our office within a few days to 1 week by e-mail or mail. We value your feedback.  [name], DO [place]

## 2020-04-15 ENCOUNTER — Other Ambulatory Visit: Payer: Self-pay | Admitting: Family Medicine

## 2020-04-15 DIAGNOSIS — I10 Essential (primary) hypertension: Secondary | ICD-10-CM

## 2020-04-15 NOTE — Telephone Encounter (Signed)
Requested medications are due for refill today yes  Requested medications are on the active medication list yes  Last refill 12/17  Last visit Do not see visit where htn is addressed within one year.  Future visit scheduled no  Notes to clinic Failed protocol due to no valid visit within 6  months, no upcoming appt scheduled.

## 2020-06-07 ENCOUNTER — Emergency Department: Admission: EM | Admit: 2020-06-07 | Payer: Self-pay | Source: Home / Self Care

## 2020-06-07 ENCOUNTER — Emergency Department
Admission: EM | Admit: 2020-06-07 | Discharge: 2020-06-07 | Disposition: A | Discharge status: Home / Self Care | Payer: Medicare Other | Attending: Emergency Medicine | Admitting: Emergency Medicine

## 2020-06-07 ENCOUNTER — Emergency Department: Payer: Medicare Other

## 2020-06-07 DIAGNOSIS — I129 Hypertensive chronic kidney disease with stage 1 through stage 4 chronic kidney disease, or unspecified chronic kidney disease: Secondary | ICD-10-CM | POA: Diagnosis not present

## 2020-06-07 DIAGNOSIS — E1136 Type 2 diabetes mellitus with diabetic cataract: Secondary | ICD-10-CM | POA: Diagnosis not present

## 2020-06-07 DIAGNOSIS — N189 Chronic kidney disease, unspecified: Secondary | ICD-10-CM | POA: Diagnosis not present

## 2020-06-07 DIAGNOSIS — Z96652 Presence of left artificial knee joint: Secondary | ICD-10-CM | POA: Insufficient documentation

## 2020-06-07 DIAGNOSIS — E1121 Type 2 diabetes mellitus with diabetic nephropathy: Secondary | ICD-10-CM | POA: Diagnosis not present

## 2020-06-07 DIAGNOSIS — M25561 Pain in right knee: Secondary | ICD-10-CM | POA: Diagnosis not present

## 2020-06-07 DIAGNOSIS — Z853 Personal history of malignant neoplasm of breast: Secondary | ICD-10-CM | POA: Diagnosis not present

## 2020-06-07 DIAGNOSIS — G4489 Other headache syndrome: Secondary | ICD-10-CM | POA: Diagnosis not present

## 2020-06-07 DIAGNOSIS — W109XXA Fall (on) (from) unspecified stairs and steps, initial encounter: Secondary | ICD-10-CM | POA: Insufficient documentation

## 2020-06-07 DIAGNOSIS — G9389 Other specified disorders of brain: Secondary | ICD-10-CM | POA: Diagnosis not present

## 2020-06-07 DIAGNOSIS — S0003XA Contusion of scalp, initial encounter: Secondary | ICD-10-CM | POA: Diagnosis not present

## 2020-06-07 DIAGNOSIS — S0990XA Unspecified injury of head, initial encounter: Secondary | ICD-10-CM

## 2020-06-07 DIAGNOSIS — Z923 Personal history of irradiation: Secondary | ICD-10-CM | POA: Diagnosis not present

## 2020-06-07 DIAGNOSIS — R6889 Other general symptoms and signs: Secondary | ICD-10-CM | POA: Diagnosis not present

## 2020-06-07 DIAGNOSIS — S8001XA Contusion of right knee, initial encounter: Secondary | ICD-10-CM | POA: Diagnosis not present

## 2020-06-07 DIAGNOSIS — S199XXA Unspecified injury of neck, initial encounter: Secondary | ICD-10-CM | POA: Diagnosis not present

## 2020-06-07 DIAGNOSIS — E1169 Type 2 diabetes mellitus with other specified complication: Secondary | ICD-10-CM | POA: Insufficient documentation

## 2020-06-07 DIAGNOSIS — E1122 Type 2 diabetes mellitus with diabetic chronic kidney disease: Secondary | ICD-10-CM | POA: Insufficient documentation

## 2020-06-07 DIAGNOSIS — M47812 Spondylosis without myelopathy or radiculopathy, cervical region: Secondary | ICD-10-CM | POA: Diagnosis not present

## 2020-06-07 DIAGNOSIS — I517 Cardiomegaly: Secondary | ICD-10-CM | POA: Diagnosis not present

## 2020-06-07 DIAGNOSIS — E785 Hyperlipidemia, unspecified: Secondary | ICD-10-CM | POA: Diagnosis not present

## 2020-06-07 DIAGNOSIS — W19XXXA Unspecified fall, initial encounter: Secondary | ICD-10-CM

## 2020-06-07 DIAGNOSIS — E049 Nontoxic goiter, unspecified: Secondary | ICD-10-CM | POA: Diagnosis not present

## 2020-06-07 DIAGNOSIS — Z79899 Other long term (current) drug therapy: Secondary | ICD-10-CM | POA: Insufficient documentation

## 2020-06-07 DIAGNOSIS — Z7982 Long term (current) use of aspirin: Secondary | ICD-10-CM | POA: Insufficient documentation

## 2020-06-07 DIAGNOSIS — Z743 Need for continuous supervision: Secondary | ICD-10-CM | POA: Diagnosis not present

## 2020-06-07 NOTE — ED Triage Notes (Signed)
Patient coming via EMS from home for mechanical trip and fall. Denies syncope/dizziness/blood thinner use. Patient reports she tripped on steps and hit head against wall. Patient reports also mild knee pain. Patient ambulatory post fall. Hematoma to forehead noted. Patient A&OX3.

## 2020-06-07 NOTE — ED Triage Notes (Signed)
Patient coming via EMS from home for mechanical trip and fall. Denies syncope/dizziness/blood thinner use. Patient reports she tripped on steps and hit head against wall. Patient reports also mild knee pain. Patient ambulatory post fall. Hematoma to forehead noted. Patient A&OX3.  

## 2020-06-07 NOTE — ED Notes (Signed)
Roderic Palau, PA at bedside to update patient on plan of care

## 2020-06-07 NOTE — ED Provider Notes (Signed)
[facility] Emergency Department Provider Note  ____________________________________________  Time seen: Approximately 12:56 PM  I have reviewed the triage vital signs and the nursing notes.   HISTORY  Chief Complaint Fall    HPI Ellen [name] is a 87 y.o. female who presents the emergency department for complaint of head injury and knee injury.  Patient states that she had a mechanical fall where she was walking, tripped, falling forward.  She hit her head on the wall but did not lose consciousness.  She endorses a small "scratch" and a large "goose egg" to the right forehead.  Again no loss of consciousness.  She currently has a headache at the site of injury but no global headache.  No vision changes.  No weakness unilaterally.  Patient denies any neck pain.  Her only other complaint is right knee pain which she landed on during this fall.  Patient reports that this knee has been replaced.  No pain in the back or hips.  No medications prior to arrival.   Patient with a history of chronic kidney disease, diabetes, hyperlipidemia, hypertension.        Past Medical History:  Diagnosis Date  . Arthritis   . Cancer ([place]) 01/2018   left breast   . Cataract   . Chronic headaches   . CKD (chronic kidney disease)   . Diabetes mellitus without complication ([place])   . Diverticulitis   . Family history of ovarian cancer   . History of syncope   . Hyperlipidemia   . Hypertension   . Multinodular goiter   . Personal history of radiation therapy   . Primary osteoarthritis of left knee   . Pulmonary hypertension [facility])     Patient Active Problem List   Diagnosis Date Noted  . Multinodular goiter 12/25/2019  . Aortic atherosclerosis ([place]) 12/18/2019  . Genetic testing 10/06/2018  . Family history of ovarian cancer   . Rotator cuff arthropathy of right shoulder 06/16/2018  . Rotator cuff arthropathy of left shoulder 03/13/2018  . Chronic pain of both shoulders  03/13/2018  . Primary cancer of upper outer quadrant of left female breast ([place]) 01/08/2018  . Type 2 diabetes mellitus with diabetic nephropathy, without long-term current use of insulin ([place]) 11/10/2017  . Chronic headaches 11/10/2017  . Hyperlipidemia associated with type 2 diabetes mellitus ([place]) 11/10/2017  . Essential hypertension 11/10/2017  . Enteritis 09/18/2017    Past Surgical History:  Procedure Laterality Date  . ABDOMINAL HYSTERECTOMY    . APPENDECTOMY    . BREAST BIOPSY Left 01/03/2018   left breast stereo/x clip/positive  . BREAST LUMPECTOMY Left 01/12/2018  . BREAST LUMPECTOMY WITH NEEDLE LOCALIZATION Left 01/12/2018   Procedure: BREAST LUMPECTOMY WITH NEEDLE LOCALIZATION;  Surgeon: Jules Husbands, MD;  Location: [facility] ORS;  Service: General;  Laterality: Left;  . CHOLECYSTECTOMY    . COLON SURGERY    . COLOSTOMY  2008  . COLOSTOMY REVERSAL  2008  . EYE SURGERY    . REPLACEMENT TOTAL KNEE Right 2018  . SENTINEL NODE BIOPSY Left 03/07/2018   Procedure: SENTINEL NODE BIOPSY;  Surgeon: Jules Husbands, MD;  Location: [facility] ORS;  Service: General;  Laterality: Left;    Prior to Admission medications   Medication Sig Start Date End Date Taking? Authorizing Provider  acetaminophen (TYLENOL) 500 MG tablet Take 1,000 mg by mouth every 6 (six) hours as needed for moderate pain or headache.     [provider]  amLODipine (NORVASC) 5  MG tablet TAKE 1 TABLET(5 MG) BY MOUTH DAILY 06/03/19   Parks Ranger, Devonne Doughty, DO  aspirin 81 MG chewable tablet Chew 81 mg by mouth daily.     [provider]  baclofen (LIORESAL) 10 MG tablet Take 0.5-1 tablets (5-10 mg total) by mouth 3 (three) times daily as needed for muscle spasms. 04/09/20   Karamalegos, Devonne Doughty, DO  Blood Glucose Monitoring Suppl (ONE TOUCH ULTRA 2) w/Device KIT Use to check blood sugar daily, up to 2 times if need 06/16/18   [name], DO  cloNIDine (CATAPRES) 0.1 MG tablet TAKE 1 TABLET BY  MOUTH DAILY AS NEEDED FOR ELEVATED BLOOD PRESSURE GREATER THAN 180/100/HEADACHE 04/23/19   Karamalegos, Devonne Doughty, DO  diclofenac Sodium (VOLTAREN) 1 % GEL Apply 2 g topically 4 (four) times daily as needed (arthritis knee, elbow). 04/09/20   Karamalegos, Devonne Doughty, DO  gabapentin (NEURONTIN) 300 MG capsule TAKE 1 CAPSULE(300 MG) BY MOUTH IN THE MORNING AND AT BEDTIME 08/23/19   Karamalegos, Devonne Doughty, DO  Garlic 791 MG TABS Take by mouth.    [provider]  Lancets Glory Rosebush ULTRASOFT) lancets Use to check blood sugar daily, up to 2 times if need 06/16/18   [name], DO  lisinopril-hydrochlorothiazide (ZESTORETIC) 20-12.5 MG tablet TAKE 1 TABLET BY MOUTH DAILY 04/15/20   [name], DO  Multiple Vitamins-Minerals (CERTAVITE SENIOR/ANTIOXIDANT) TABS Take by mouth.    [provider]  ONE TOUCH ULTRA TEST test strip Use to check blood sugar daily, up to 2 times if need 06/16/18   [name], DO  pravastatin (PRAVACHOL) 20 MG tablet TAKE 1 TABLET(20 MG) BY MOUTH AT BEDTIME 06/05/19   Karamalegos, Devonne Doughty, DO  triamcinolone cream (KENALOG) 0.5 % Apply 1 application topically 2 (two) times daily. To affected areas, for up to 2 weeks. 08/10/19   Karamalegos, Devonne Doughty, DO    Allergies Codeine and Morphine and related  Family History  Problem Relation Age of Onset  . Lung cancer Mother 35  . Diabetes Father   . Stroke Father   . Hypertension Maternal Grandmother   . Hypertension Maternal Uncle   . Hypertension Maternal Aunt   . Ovarian cancer Maternal Aunt 6  . Heart disease Daughter     Social History Social History   Tobacco Use  . Smoking status: Never Smoker  . Smokeless tobacco: Never Used  Vaping Use  . Vaping Use: Never used  Substance Use Topics  . Alcohol use: Never  . Drug use: Never     Review of Systems  Constitutional: No fever/chills.  Mechanical fall with patient striking her head.  Hematoma noted to  right frontal skull.  No loss consciousness Eyes: No visual changes. No discharge ENT: No upper respiratory complaints. Cardiovascular: no chest pain. Respiratory: no cough. No SOB. Gastrointestinal: No abdominal pain.  No nausea, no vomiting.  No diarrhea.  No constipation. Genitourinary: Negative for dysuria. No hematuria Musculoskeletal: Right knee pain Skin: Negative for rash, abrasions, lacerations, ecchymosis. Neurological: No loss consciousness.  Negative for headaches, focal weakness or numbness.  10 System ROS otherwise negative.  ____________________________________________   PHYSICAL EXAM:  VITAL SIGNS: ED Triage Vitals  Enc Vitals Group     BP 06/07/20 1256 (!) 163/63     Pulse Rate 06/07/20 1256 61     Resp 06/07/20 1256 16     Temp 06/07/20 1256 98.1 F (36.7 C)     Temp Source 06/07/20 1256 Oral  SpO2 06/07/20 1256 98 %     Weight 06/07/20 1256 140 lb (63.5 kg)     Height 06/07/20 1256 5' (1.524 m)     Head Circumference --      Peak Flow --      Pain Score --      Pain Loc --      Pain Edu? --      Excl. in [place]? --      Constitutional: Alert and oriented. Well appearing and in no acute distress. Eyes: Conjunctivae are normal. PERRL. EOMI. Head: A visualization of the head reveals hematoma about the right frontal skull.  Slight abrasion with dried scab with no active bleeding.  Patient is tender over this area without any palpable crepitus.  No other tenderness or visible signs of trauma to the skull or face.  No battle signs, raccoon eyes, serosanguineous fluid drainage from the ears or nares. ENT:      Ears:       Nose: No congestion/rhinnorhea.      Mouth/Throat: Mucous membranes are moist.  Neck: No stridor.  No cervical spine tenderness to palpation.  Cardiovascular: Normal rate, regular rhythm. Normal S1 and S2.  Good peripheral circulation. Respiratory: Normal respiratory effort without tachypnea or retractions. Lungs CTAB. Good air entry to the  bases with no decreased or absent breath sounds. Musculoskeletal: Full range of motion to all extremities. No gross deformities appreciated.  Visualization of the right knee reveals visible scar from previous knee replacement.  This appears remote.  No gross edema, ecchymosis.  No abrasions or lacerations.  Good range of motion preserved currently.  No significant tenderness on palpation with no palpable abnormalities either.  No ballottement.  Examination of the right hip and ankle is unremarkable.  Dorsalis pedis pulse and sensation intact distally. Neurologic:  Normal speech and language. No gross focal neurologic deficits are appreciated.  Cranial nerves II through XII grossly intact. Skin:  Skin is warm, dry and intact. No rash noted. Psychiatric: Mood and affect are normal. Speech and behavior are normal. Patient exhibits appropriate insight and judgement.   ____________________________________________   LABS (all labs ordered are listed, but only abnormal results are displayed)  Labs Reviewed - No data to display ____________________________________________  EKG   ____________________________________________  RADIOLOGY I personally viewed and evaluated these images as part of my medical decision making, as well as reviewing the written report by the radiologist.  ED Provider Interpretation: No acute traumatic findings regards to skull fracture or intracranial hemorrhage.  Small scalp hematoma is identified.  No acute findings about the right knee.  CT Head Wo Contrast  Result Date: 06/07/2020 CLINICAL DATA:  Tripped and fell. Hematoma to the right frontal region. EXAM: CT HEAD WITHOUT CONTRAST CT CERVICAL SPINE WITHOUT CONTRAST TECHNIQUE: Multidetector CT imaging of the head and cervical spine was performed following the standard protocol without intravenous contrast. Multiplanar CT image reconstructions of the cervical spine were also generated. COMPARISON:  Head CT, 08/06/2017.  FINDINGS: CT HEAD FINDINGS Brain: No evidence of acute infarction, hemorrhage, hydrocephalus, extra-axial collection or mass lesion/mass effect. There is age appropriate mild ventricular sulcal enlargement. Mild periventricular white matter hypoattenuation is noted consistent with chronic microvascular ischemic change. Vascular: No hyperdense vessel or unexpected calcification. Skull: Normal. Negative for fracture or focal lesion. Sinuses/Orbits: Visualized globes and orbits are unremarkable. Visualized sinuses are clear. Other: Small right frontal scalp hematoma. CT CERVICAL SPINE FINDINGS Alignment: Normal. Skull base and vertebrae: No acute fracture. No primary bone  lesion or focal pathologic process. Soft tissues and spinal canal: No prevertebral fluid or swelling. No visible canal hematoma. Disc levels: Moderate loss of disc height at C3-C4 and C5-C6. Marked loss of disc height at C6-C7, C7-T1 and T1-T2. Diffuse spondylotic disc bulging at these levels. There are bilateral facet degenerative changes, greatest on the left at C4-C5. No convincing disc herniation. Upper chest: No acute findings. Heterogeneously enlarged thyroid. Previous thyroid ultrasound performed on 02/14/2020. Other: None. IMPRESSION: HEAD CT 1. No acute intracranial abnormalities.  No skull fracture. 2. Small right frontal scalp hematoma. CERVICAL CT 1. No fracture or acute finding. Electronically Signed   By: Lajean Manes M.D.   On: 06/07/2020 13:35   CT Cervical Spine Wo Contrast  Result Date: 06/07/2020 CLINICAL DATA:  Tripped and fell. Hematoma to the right frontal region. EXAM: CT HEAD WITHOUT CONTRAST CT CERVICAL SPINE WITHOUT CONTRAST TECHNIQUE: Multidetector CT imaging of the head and cervical spine was performed following the standard protocol without intravenous contrast. Multiplanar CT image reconstructions of the cervical spine were also generated. COMPARISON:  Head CT, 08/06/2017. FINDINGS: CT HEAD FINDINGS Brain: No evidence  of acute infarction, hemorrhage, hydrocephalus, extra-axial collection or mass lesion/mass effect. There is age appropriate mild ventricular sulcal enlargement. Mild periventricular white matter hypoattenuation is noted consistent with chronic microvascular ischemic change. Vascular: No hyperdense vessel or unexpected calcification. Skull: Normal. Negative for fracture or focal lesion. Sinuses/Orbits: Visualized globes and orbits are unremarkable. Visualized sinuses are clear. Other: Small right frontal scalp hematoma. CT CERVICAL SPINE FINDINGS Alignment: Normal. Skull base and vertebrae: No acute fracture. No primary bone lesion or focal pathologic process. Soft tissues and spinal canal: No prevertebral fluid or swelling. No visible canal hematoma. Disc levels: Moderate loss of disc height at C3-C4 and C5-C6. Marked loss of disc height at C6-C7, C7-T1 and T1-T2. Diffuse spondylotic disc bulging at these levels. There are bilateral facet degenerative changes, greatest on the left at C4-C5. No convincing disc herniation. Upper chest: No acute findings. Heterogeneously enlarged thyroid. Previous thyroid ultrasound performed on 02/14/2020. Other: None. IMPRESSION: HEAD CT 1. No acute intracranial abnormalities.  No skull fracture. 2. Small right frontal scalp hematoma. CERVICAL CT 1. No fracture or acute finding. Electronically Signed   By: Lajean Manes M.D.   On: 06/07/2020 13:35   DG Knee Complete 4 Views Right  Result Date: 06/07/2020 CLINICAL DATA:  Fall, right knee pain EXAM: RIGHT KNEE - COMPLETE 4+ VIEW COMPARISON:  None. FINDINGS: Surgical changes of prior total knee arthroplasty. No evidence of hardware complication or periprosthetic fracture. No knee joint effusion. Atherosclerotic calcifications visualized in the distal superficial femoral artery. IMPRESSION: Total knee arthroplasty without evidence of hardware complication, fracture or malalignment. Atherosclerotic peripheral vascular disease.  Electronically Signed   By: Jacqulynn Cadet M.D.   On: 06/07/2020 13:47    ____________________________________________    PROCEDURES  Procedure(s) performed:    Procedures    Medications - No data to display   ____________________________________________   INITIAL IMPRESSION / ASSESSMENT AND PLAN / ED COURSE  Pertinent labs & imaging results that were available during my care of the patient were reviewed by me and considered in my medical decision making (see chart for details).  Review of the [place] CSRS was performed in accordance of the [place] prior to dispensing any controlled drugs.           Patient's diagnosis is consistent with fall, scalp/facial contusion, contusion of the right knee.  Patient presents emergency department after mechanical  fall.  She tripped falling and hitting her head against a wall.  No loss of consciousness.  Patient was neurologically intact.  She did have a noticeable hematoma to the right frontal skull.  Imaging of the head and neck are reassuring without acute traumatic finding.  Patient was also complaining of some knee pain.  Imaging was reassuring without acute traumatic finding.  Remainder of exam was reassuring.  Patient has no other complaints currently.  She stable for discharge.  Return precautions discussed with the patient.  No prescriptions at this time.  Follow-up primary care as needed. Patient is given ED precautions to return to the ED for any worsening or new symptoms.     ____________________________________________  FINAL CLINICAL IMPRESSION(S) / ED DIAGNOSES  Final diagnoses:  Fall, initial encounter  Minor head injury, initial encounter  Contusion of right knee, initial encounter      NEW MEDICATIONS STARTED DURING THIS VISIT:  ED Discharge Orders    None          This chart was dictated using voice recognition software/Dragon. Despite best efforts to proofread, errors can occur which can change the  meaning. Any change was purely unintentional.    Darletta Moll, PA-C 06/07/20 1402    Delman Kitten, MD 06/07/20 1710

## 2020-07-07 ENCOUNTER — Telehealth: Payer: Self-pay | Admitting: Family Medicine

## 2020-07-07 NOTE — Telephone Encounter (Signed)
Copied from [place] [phone]. Topic: Medicare AWV >> Jul 07, 2020 12:34 PM Cher Nakai R wrote: Reason for CRM: Phone rings then hangs up no able to leave a message for patient to call back and schedule Medicare Annual Wellness Visit (AWV) to be done virtually or by telephone.  No hx of AWV eligible as of 02/01/2009 awvi  Please schedule at anytime with [facility] Health Advisor.      40 Minutes appointment   Any questions, please call me at [phone]

## 2020-08-09 ENCOUNTER — Emergency Department
Admission: EM | Admit: 2020-08-09 | Discharge: 2020-08-09 | Disposition: A | Discharge status: Home / Self Care | Payer: Medicare Other | Attending: Emergency Medicine | Admitting: Emergency Medicine

## 2020-08-09 ENCOUNTER — Encounter: Payer: Self-pay | Admitting: Emergency Medicine

## 2020-08-09 ENCOUNTER — Other Ambulatory Visit: Payer: Self-pay

## 2020-08-09 ENCOUNTER — Emergency Department: Payer: Medicare Other

## 2020-08-09 DIAGNOSIS — Z79899 Other long term (current) drug therapy: Secondary | ICD-10-CM | POA: Insufficient documentation

## 2020-08-09 DIAGNOSIS — Z853 Personal history of malignant neoplasm of breast: Secondary | ICD-10-CM | POA: Diagnosis not present

## 2020-08-09 DIAGNOSIS — Z7982 Long term (current) use of aspirin: Secondary | ICD-10-CM | POA: Diagnosis not present

## 2020-08-09 DIAGNOSIS — I129 Hypertensive chronic kidney disease with stage 1 through stage 4 chronic kidney disease, or unspecified chronic kidney disease: Secondary | ICD-10-CM | POA: Insufficient documentation

## 2020-08-09 DIAGNOSIS — R296 Repeated falls: Secondary | ICD-10-CM

## 2020-08-09 DIAGNOSIS — E1122 Type 2 diabetes mellitus with diabetic chronic kidney disease: Secondary | ICD-10-CM | POA: Diagnosis not present

## 2020-08-09 DIAGNOSIS — R519 Headache, unspecified: Secondary | ICD-10-CM | POA: Insufficient documentation

## 2020-08-09 DIAGNOSIS — N189 Chronic kidney disease, unspecified: Secondary | ICD-10-CM | POA: Insufficient documentation

## 2020-08-09 DIAGNOSIS — R42 Dizziness and giddiness: Secondary | ICD-10-CM | POA: Diagnosis not present

## 2020-08-09 DIAGNOSIS — R001 Bradycardia, unspecified: Secondary | ICD-10-CM | POA: Diagnosis not present

## 2020-08-09 DIAGNOSIS — Z96651 Presence of right artificial knee joint: Secondary | ICD-10-CM | POA: Insufficient documentation

## 2020-08-09 LAB — URINALYSIS, COMPLETE (UACMP) WITH MICROSCOPIC
Bacteria, UA: NONE SEEN
Bilirubin Urine: NEGATIVE
Glucose, UA: NEGATIVE mg/dL
Hgb urine dipstick: NEGATIVE
Ketones, ur: NEGATIVE mg/dL
Leukocytes,Ua: NEGATIVE
Nitrite: NEGATIVE
Protein, ur: NEGATIVE mg/dL
Specific Gravity, Urine: 1.011 (ref 1.005–1.030)
pH: 7 (ref 5.0–8.0)

## 2020-08-09 LAB — CBC
HCT: 33.1 % — ABNORMAL LOW (ref 36.0–46.0)
Hemoglobin: 10.7 g/dL — ABNORMAL LOW (ref 12.0–15.0)
MCH: 28 pg (ref 26.0–34.0)
MCHC: 32.3 g/dL (ref 30.0–36.0)
MCV: 86.6 fL (ref 80.0–100.0)
Platelets: 281 10*3/uL (ref 150–400)
RBC: 3.82 MIL/uL — ABNORMAL LOW (ref 3.87–5.11)
RDW: 16.4 % — ABNORMAL HIGH (ref 11.5–15.5)
WBC: 4.8 10*3/uL (ref 4.0–10.5)
nRBC: 0 % (ref 0.0–0.2)

## 2020-08-09 LAB — BASIC METABOLIC PANEL
Anion gap: 6 (ref 5–15)
BUN: 19 mg/dL (ref 8–23)
CO2: 26 mmol/L (ref 22–32)
Calcium: 8.8 mg/dL — ABNORMAL LOW (ref 8.9–10.3)
Chloride: 106 mmol/L (ref 98–111)
Creatinine, Ser: 1.21 mg/dL — ABNORMAL HIGH (ref 0.44–1.00)
GFR, Estimated: 43 mL/min — ABNORMAL LOW (ref >60)
Glucose, Bld: 88 mg/dL (ref 70–99)
Potassium: 4.4 mmol/L (ref 3.5–5.1)
Sodium: 138 mmol/L (ref 135–145)

## 2020-08-09 LAB — TROPONIN I (HIGH SENSITIVITY): Troponin I (High Sensitivity): 9 ng/L (ref <18)

## 2020-08-09 NOTE — Discharge Instructions (Addendum)
Please speak to your primary care physician about medications that may be making you feel/lightheaded including amlodipine, clonidine, gabapentin, lisinopril/hydrochlorothiazide, and/or baclofen if you are still taking this medication

## 2020-08-09 NOTE — ED Notes (Signed)
Pt given apple juice  

## 2020-08-09 NOTE — ED Provider Notes (Signed)
[facility] Emergency Department Provider Note   ____________________________________________   Event Date/Time   First MD Initiated Contact with Patient 08/09/20 1143     (approximate)  I have reviewed the triage vital signs and the nursing notes.   HISTORY  Chief Complaint Headache and Loss of Consciousness    HPI Ellen [name] is a 87 y.o. female below stated past medical history presents for generalized weakness and multiple falls over the last 2 weeks  LOCATION: Generalized DURATION: 2 weeks TIMING: Intermittent but worsening SEVERITY: Moderate QUALITY: Generalized weakness CONTEXT: Denies any medication changes or nonadherence MODIFYING FACTORS: States that rising from a seated position or laying position causes lightheadedness and occasional falls due to weakness in her lower extremities ASSOCIATED SYMPTOMS: Orthostatic lightheadedness   Per medical record review, patient's medication list includes clonidine, amlodipine, and lisinopril/hydrochlorothiazide as well as other sedative/hypnotic medications          Past Medical History:  Diagnosis Date   Arthritis    Cancer ([place]) 01/2018   left breast    Cataract    Chronic headaches    CKD (chronic kidney disease)    Diabetes mellitus without complication ([place])    Diverticulitis    Family history of ovarian cancer    History of syncope    Hyperlipidemia    Hypertension    Multinodular goiter    Personal history of radiation therapy    Primary osteoarthritis of left knee    Pulmonary hypertension ([place])     Patient Active Problem List   Diagnosis Date Noted   Multinodular goiter 12/25/2019   Aortic atherosclerosis ([place]) 12/18/2019   Genetic testing 10/06/2018   Family history of ovarian cancer    Rotator cuff arthropathy of right shoulder 06/16/2018   Rotator cuff arthropathy of left shoulder 03/13/2018   Chronic pain of both shoulders 03/13/2018   Primary cancer of upper  outer quadrant of left female breast ([place]) 01/08/2018   Type 2 diabetes mellitus with diabetic nephropathy, without long-term current use of insulin ([place]) 11/10/2017   Chronic headaches 11/10/2017   Hyperlipidemia associated with type 2 diabetes mellitus ([place]) 11/10/2017   Essential hypertension 11/10/2017   Enteritis 09/18/2017    Past Surgical History:  Procedure Laterality Date   ABDOMINAL HYSTERECTOMY     APPENDECTOMY     BREAST BIOPSY Left 01/03/2018   left breast stereo/x clip/positive   BREAST LUMPECTOMY Left 01/12/2018   BREAST LUMPECTOMY WITH NEEDLE LOCALIZATION Left 01/12/2018   Procedure: BREAST LUMPECTOMY WITH NEEDLE LOCALIZATION;  Surgeon: Jules Husbands, MD;  Location: [facility] ORS;  Service: General;  Laterality: Left;   CHOLECYSTECTOMY     COLON SURGERY     COLOSTOMY  2008   COLOSTOMY REVERSAL  2008   EYE SURGERY     REPLACEMENT TOTAL KNEE Right 2018   SENTINEL NODE BIOPSY Left 03/07/2018   Procedure: SENTINEL NODE BIOPSY;  Surgeon: Jules Husbands, MD;  Location: [facility] ORS;  Service: General;  Laterality: Left;    Prior to Admission medications   Medication Sig Start Date End Date Taking? Authorizing Provider  acetaminophen (TYLENOL) 500 MG tablet Take 1,000 mg by mouth every 6 (six) hours as needed for moderate pain or headache.     [provider]  amLODipine (NORVASC) 5 MG tablet TAKE 1 TABLET(5 MG) BY MOUTH DAILY 06/03/19   Parks Ranger, Devonne Doughty, DO  aspirin 81 MG chewable tablet Chew 81 mg by mouth daily.     [provider]  baclofen (LIORESAL) 10 MG tablet Take 0.5-1 tablets (5-10 mg total) by mouth 3 (three) times daily as needed for muscle spasms. 04/09/20   Karamalegos, Devonne Doughty, DO  Blood Glucose Monitoring Suppl (ONE TOUCH ULTRA 2) w/Device KIT Use to check blood sugar daily, up to 2 times if need 06/16/18   [name], DO  cloNIDine (CATAPRES) 0.1 MG tablet TAKE 1 TABLET BY MOUTH DAILY AS NEEDED FOR ELEVATED BLOOD PRESSURE  GREATER THAN 180/100/HEADACHE 04/23/19   Karamalegos, Devonne Doughty, DO  diclofenac Sodium (VOLTAREN) 1 % GEL Apply 2 g topically 4 (four) times daily as needed (arthritis knee, elbow). 04/09/20   Karamalegos, Devonne Doughty, DO  gabapentin (NEURONTIN) 300 MG capsule TAKE 1 CAPSULE(300 MG) BY MOUTH IN THE MORNING AND AT BEDTIME 08/23/19   Karamalegos, Devonne Doughty, DO  Garlic 505 MG TABS Take by mouth.    [provider]  Lancets Glory Rosebush ULTRASOFT) lancets Use to check blood sugar daily, up to 2 times if need 06/16/18   [name], DO  lisinopril-hydrochlorothiazide (ZESTORETIC) 20-12.5 MG tablet TAKE 1 TABLET BY MOUTH DAILY 04/15/20   [name], DO  Multiple Vitamins-Minerals (CERTAVITE SENIOR/ANTIOXIDANT) TABS Take by mouth.    [provider]  ONE TOUCH ULTRA TEST test strip Use to check blood sugar daily, up to 2 times if need 06/16/18   [name], DO  pravastatin (PRAVACHOL) 20 MG tablet TAKE 1 TABLET(20 MG) BY MOUTH AT BEDTIME 06/05/19   Karamalegos, Devonne Doughty, DO  triamcinolone cream (KENALOG) 0.5 % Apply 1 application topically 2 (two) times daily. To affected areas, for up to 2 weeks. 08/10/19   Karamalegos, Devonne Doughty, DO    Allergies Codeine and Morphine and related  Family History  Problem Relation Age of Onset   Lung cancer Mother 64   Diabetes Father    Stroke Father    Hypertension Maternal Grandmother    Hypertension Maternal Uncle    Hypertension Maternal Aunt    Ovarian cancer Maternal Aunt 7   Heart disease Daughter     Social History Social History   Tobacco Use   Smoking status: Never   Smokeless tobacco: Never  Vaping Use   Vaping Use: Never used  Substance Use Topics   Alcohol use: Never   Drug use: Never    Review of Systems Constitutional: No fever/chills Eyes: No visual changes. ENT: No sore throat. Cardiovascular: Denies chest pain. Respiratory: Denies shortness of breath. Gastrointestinal: No  abdominal pain.  No nausea, no vomiting.  No diarrhea. Genitourinary: Negative for dysuria. Musculoskeletal: Negative for acute arthralgias Skin: Negative for rash. Neurological: Endorses intermittent generalized weakness.  Negative for headaches, numbness/paresthesias in any extremity Psychiatric: Negative for suicidal ideation/homicidal ideation   ____________________________________________   PHYSICAL EXAM:  VITAL SIGNS: ED Triage Vitals  Enc Vitals Group     BP 08/09/20 1135 (!) 172/73     Pulse Rate 08/09/20 1135 63     Resp 08/09/20 1135 16     Temp 08/09/20 1135 98.4 F (36.9 C)     Temp Source 08/09/20 1135 Oral     SpO2 08/09/20 1135 97 %     Weight 08/09/20 1126 143 lb (64.9 kg)     Height 08/09/20 1126 5' (1.524 m)     Head Circumference --      Peak Flow --      Pain Score 08/09/20 1126 9     Pain Loc --      Pain Edu? --  Excl. in [place]? --    Constitutional: Alert and oriented. Well appearing and in no acute distress. Eyes: Conjunctivae are normal. PERRL. Head: Atraumatic. Nose: No congestion/rhinnorhea. Mouth/Throat: Mucous membranes are moist. Neck: No stridor Cardiovascular: Grossly normal heart sounds.  Good peripheral circulation. Respiratory: Normal respiratory effort.  No retractions. Gastrointestinal: Soft and nontender. No distention. Musculoskeletal: No obvious deformities Neurologic:  Normal speech and language. No gross focal neurologic deficits are appreciated. Skin:  Skin is warm and dry. No rash noted. Psychiatric: Mood and affect are normal. Speech and behavior are normal.  ____________________________________________   LABS (all labs ordered are listed, but only abnormal results are displayed)  Labs Reviewed  BASIC METABOLIC PANEL - Abnormal; Notable for the following components:      Result Value   Creatinine, Ser 1.21 (*)    Calcium 8.8 (*)    GFR, Estimated 43 (*)    All other components within normal limits  CBC - Abnormal;  Notable for the following components:   RBC 3.82 (*)    Hemoglobin 10.7 (*)    HCT 33.1 (*)    RDW 16.4 (*)    All other components within normal limits  URINALYSIS, COMPLETE (UACMP) WITH MICROSCOPIC - Abnormal; Notable for the following components:   Color, Urine STRAW (*)    APPearance CLEAR (*)    All other components within normal limits  TROPONIN I (HIGH SENSITIVITY)   ____________________________________________  EKG  ED ECG REPORT I, Naaman Plummer, the attending physician, personally viewed and interpreted this ECG.  Date: 08/09/2020 EKG Time: 1139 Rate: 57 Rhythm: Bradycardic sinus rhythm QRS Axis: normal Intervals: normal ST/T Wave abnormalities: normal Narrative Interpretation: no evidence of acute ischemia  ____________________________________________  RADIOLOGY  ED MD interpretation: CT of the head without contrast shows no evidence of acute abnormalities including no intracerebral hemorrhage, obvious masses, or significant edema  Official radiology report(s): CT Head Wo Contrast  Result Date: 08/09/2020 CLINICAL DATA:  Mental status changes EXAM: CT HEAD WITHOUT CONTRAST TECHNIQUE: Contiguous axial images were obtained from the base of the skull through the vertex without intravenous contrast. COMPARISON:  06/07/2020 FINDINGS: Brain: Mild age related volume loss. No acute intracranial abnormality. Specifically, no hemorrhage, hydrocephalus, mass lesion, acute infarction, or significant intracranial injury. 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 Vascular: No hyperdense vessel or unexpected calcification. Skull: No acute calvarial abnormality. Sinuses/Orbits: No acute findings Other: None IMPRESSION: No acute intracranial abnormality. Electronically Signed   By: Rolm Baptise M.D.   On: 08/09/2020 13:31    ____________________________________________   PROCEDURES  Procedure(s) performed (including Critical Care):  .1-3 Lead EKG Interpretation  Date/Time:  08/09/2020 1:53 PM Performed by: Naaman Plummer, MD Authorized by: Naaman Plummer, MD     Interpretation: abnormal     ECG rate:  54   ECG rate assessment: bradycardic     Rhythm: sinus bradycardia     Ectopy: none     Conduction: normal     ____________________________________________   INITIAL IMPRESSION / ASSESSMENT AND PLAN / ED COURSE  As part of my medical decision making, I reviewed the following data within the electronic medical record, if available:  Nursing notes reviewed and incorporated, Labs reviewed, EKG interpreted, Old chart reviewed, Radiograph reviewed and Notes from prior ED visits reviewed and incorporated      Patient presents with complaints of presyncope ED Workup:  CBC, BMP, Troponin, BNP, ECG, head CT Differential diagnosis includes HF, ICH, seizure, stroke, HOCM, ACS, aortic dissection, malignant arrhythmia, or  GI bleed. Findings: No evidence of acute laboratory abnormalities.  Troponin negative x1 EKG: No e/o STEMI. No evidence of Brugadas sign, delta wave, epsilon wave, significantly prolonged QTc, or malignant arrhythmia.  Disposition: Discharge. Patient is at baseline at this time. Return precautions expressed and understood in person. Advised follow up with primary care provider or clinic physician in next 24 hours.      ____________________________________________   FINAL CLINICAL IMPRESSION(S) / ED DIAGNOSES  Final diagnoses:  Episodic lightheadedness  Multiple falls  Bradycardia, unspecified     ED Discharge Orders     None        Note:  This document was prepared using Dragon voice recognition software and may include unintentional dictation errors.    Naaman Plummer, MD 08/09/20 [phone]

## 2020-08-09 NOTE — ED Triage Notes (Signed)
Pt reports that she developed a headache for the last several days. She reports that she passed out today. Denies any N/V

## 2020-08-09 NOTE — ED Notes (Signed)
ED Provider at bedside. 

## 2020-08-11 LAB — CBG MONITORING, ED: Glucose-Capillary: 61 mg/dL — ABNORMAL LOW (ref 70–99)

## 2020-08-14 ENCOUNTER — Other Ambulatory Visit: Payer: Self-pay

## 2020-08-14 ENCOUNTER — Ambulatory Visit: Payer: Medicare Other | Admitting: Family Medicine

## 2020-08-19 ENCOUNTER — Encounter: Payer: Self-pay | Admitting: Family Medicine

## 2020-08-19 ENCOUNTER — Ambulatory Visit (INDEPENDENT_AMBULATORY_CARE_PROVIDER_SITE_OTHER): Payer: Medicare Other | Admitting: Family Medicine

## 2020-08-19 ENCOUNTER — Other Ambulatory Visit: Payer: Self-pay

## 2020-08-19 VITALS — BP 160/80 | HR 61 | Ht 60.0 in | Wt 133.2 lb

## 2020-08-19 DIAGNOSIS — R001 Bradycardia, unspecified: Secondary | ICD-10-CM | POA: Diagnosis not present

## 2020-08-19 DIAGNOSIS — R42 Dizziness and giddiness: Secondary | ICD-10-CM

## 2020-08-19 DIAGNOSIS — I10 Essential (primary) hypertension: Secondary | ICD-10-CM | POA: Diagnosis not present

## 2020-08-19 NOTE — Patient Instructions (Addendum)
Thank you for coming to the office today.  [facility]) [facility] at [place], [place] [address] Main: [phone]   Stay tuned for apt for Cardiology for slow heart rate and blood pressure  In mean time, take TWO of your Amlodipine 5mg  tablets, in AM, back to back, 5mg  x 2 = 10mg  daily we can order new pill for 10mg  once daily if BP is improved  Keep track of BP   Please schedule a Follow-up Appointment to: Return in about 4 weeks (around 09/16/2020), or if symptoms worsen or fail to improve, for HTN , Cardiology follow-up.  If you have any other questions or concerns, please feel free to call the office or send a message through [place]. You may also schedule an earlier appointment if necessary.  Additionally, you may be receiving a survey about your experience at our office within a few days to 1 week by e-mail or mail. We value your feedback.  [name], DO [place]

## 2020-08-19 NOTE — Progress Notes (Signed)
Subjective:    Patient ID: Ellen [name], female    DOB: 05-12-1929, 87 y.o.   MRN: [id number]  Ellen [name] is a 87 y.o. female presenting on 08/19/2020 for Hospitalization Follow-up and Dizziness   HPI  Dizziness / Bradycardia  HOSPITAL FOLLOW-UP VISIT  ED FOLLOW-UP VISIT  Hospital/Location: [facility] Date of ED Visit: 08/09/20  Reason for Presenting to ED: Dizziness, Weakness  FOLLOW-UP  - ED provider note and record have been reviewed - Patient presents today about 10 days after recent ED visit. Brief summary of recent course, patient had symptoms of dizziness weakness HTN, presented to ED on 08/09/20, testing in ED done and had EKG sinus brady  Today reports overall has done fairly well after discharge from ED. Symptoms of dizziness weakness have improved. HR has been back to normal. Her BP has been elevated    Depression screen [facility] 2/9 04/23/2019 09/21/2018 04/12/2018  Decreased Interest 0 0 0  Down, Depressed, Hopeless 0 0 0  PHQ - 2 Score 0 0 0    Social History   Tobacco Use   Smoking status: Never   Smokeless tobacco: Never  Vaping Use   Vaping Use: Never used  Substance Use Topics   Alcohol use: Never   Drug use: Never    Review of Systems Per HPI unless specifically indicated above     Objective:    BP (!) 160/80 (BP Location: Left Arm, Cuff Size: Normal)   Pulse 61   Ht 5' (1.524 m)   Wt 133 lb 3.2 oz (60.4 kg)   SpO2 100%   BMI 26.01 kg/m   Wt Readings from Last 3 Encounters:  08/19/20 133 lb 3.2 oz (60.4 kg)  08/09/20 143 lb (64.9 kg)  06/07/20 140 lb (63.5 kg)    Physical Exam Vitals and nursing note reviewed.  Constitutional:      General: She is not in acute distress.    Appearance: She is well-developed. She is not diaphoretic.     Comments: Well-appearing, comfortable, cooperative  HENT:     Head: Normocephalic and atraumatic.  Eyes:     General:        Right eye: No discharge.        Left eye: No discharge.      Conjunctiva/sclera: Conjunctivae normal.  Neck:     Thyroid: No thyromegaly.  Cardiovascular:     Rate and Rhythm: Normal rate and regular rhythm.     Heart sounds: Normal heart sounds. No murmur heard. Pulmonary:     Effort: Pulmonary effort is normal. No respiratory distress.     Breath sounds: Normal breath sounds. No wheezing or rales.  Musculoskeletal:        General: Normal range of motion.     Cervical back: Normal range of motion and neck supple.  Lymphadenopathy:     Cervical: No cervical adenopathy.  Skin:    General: Skin is warm and dry.     Findings: No erythema or rash.  Neurological:     Mental Status: She is alert and oriented to person, place, and time.  Psychiatric:        Behavior: Behavior normal.     Comments: Well groomed, good eye contact, normal speech and thoughts      Results for orders placed or performed during the hospital encounter of 09/47/09  Basic metabolic panel  Result Value Ref Range   Sodium 138 135 - 145 mmol/L   Potassium 4.4 3.5 - 5.1 mmol/L  Chloride 106 98 - 111 mmol/L   CO2 26 22 - 32 mmol/L   Glucose, Bld 88 70 - 99 mg/dL   BUN 19 8 - 23 mg/dL   Creatinine, Ser 1.21 (H) 0.44 - 1.00 mg/dL   Calcium 8.8 (L) 8.9 - 10.3 mg/dL   GFR, Estimated 43 (L) >60 mL/min   Anion gap 6 5 - 15  CBC  Result Value Ref Range   WBC 4.8 4.0 - 10.5 K/uL   RBC 3.82 (L) 3.87 - 5.11 MIL/uL   Hemoglobin 10.7 (L) 12.0 - 15.0 g/dL   HCT 33.1 (L) 36.0 - 46.0 %   MCV 86.6 80.0 - 100.0 fL   MCH 28.0 26.0 - 34.0 pg   MCHC 32.3 30.0 - 36.0 g/dL   RDW 16.4 (H) 11.5 - 15.5 %   Platelets 281 150 - 400 K/uL   nRBC 0.0 0.0 - 0.2 %  Urinalysis, Complete w Microscopic Urine, Clean Catch  Result Value Ref Range   Color, Urine STRAW (A) YELLOW   APPearance CLEAR (A) CLEAR   Specific Gravity, Urine 1.011 1.005 - 1.030   pH 7.0 5.0 - 8.0   Glucose, UA NEGATIVE NEGATIVE mg/dL   Hgb urine dipstick NEGATIVE NEGATIVE   Bilirubin Urine NEGATIVE NEGATIVE    Ketones, ur NEGATIVE NEGATIVE mg/dL   Protein, ur NEGATIVE NEGATIVE mg/dL   Nitrite NEGATIVE NEGATIVE   Leukocytes,Ua NEGATIVE NEGATIVE   RBC / HPF 0-5 0 - 5 RBC/hpf   WBC, UA 0-5 0 - 5 WBC/hpf   Bacteria, UA NONE SEEN NONE SEEN   Squamous Epithelial / LPF 0-5 0 - 5   Mucus PRESENT   CBG monitoring, ED  Result Value Ref Range   Glucose-Capillary 61 (L) 70 - 99 mg/dL  Troponin I (High Sensitivity)  Result Value Ref Range   Troponin I (High Sensitivity) 9 <18 ng/L      Assessment & Plan:   Problem List Items Addressed This Visit     Essential hypertension   Relevant Medications   amLODipine (NORVASC) 5 MG tablet   Other Visit Diagnoses     Bradycardia    -  Primary   Relevant Orders   Ambulatory referral to Cardiology   Episode of dizziness       Relevant Orders   Ambulatory referral to Cardiology       ED Follow-up with symptomatic bradycardia, and hypertension, has had dizziness weakness falls with identified sinus brady in ED, not on beta blocker or other medication to trigger, has notable still elevated BP today, repeat manual check improved.  Increase Amlodipine from 5 to 10mg  daily for HTN. No other med adjust today  Referral to Cardiology for consultation on concerning bradycardia episode with symptoms and difficult to control HTN   Return criteria reviewed   Orders Placed This Encounter  Procedures   Ambulatory referral to Cardiology    Referral Priority:   Routine    Referral Type:   Consultation    Referral Reason:   Specialty Services Required    Requested Specialty:   Cardiology    Number of Visits Requested:   1     No orders of the defined types were placed in this encounter.     Follow up plan: Return in about 4 weeks (around 09/16/2020), or if symptoms worsen or fail to improve, for HTN , Cardiology follow-up.   [name], [facility] 08/19/2020, 9:44 AM

## 2020-08-22 ENCOUNTER — Ambulatory Visit: Payer: Medicare Other | Admitting: Cardiovascular Disease

## 2020-10-13 ENCOUNTER — Inpatient Hospital Stay: Payer: Medicare Other | Admitting: Nurse Practitioner

## 2020-10-27 ENCOUNTER — Telehealth: Payer: Self-pay | Admitting: Family Medicine

## 2020-10-27 NOTE — Telephone Encounter (Signed)
Left message for patient to call back and schedule Medicare Annual Wellness Visit (AWV) to be done virtually or by telephone.  No hx of AWV eligible as of 02/01/09  Please schedule at anytime with [facility].      40 Minutes appointment   Any questions, please call me at [phone]

## 2020-10-30 ENCOUNTER — Ambulatory Visit: Payer: Medicare Other | Admitting: Cardiovascular Disease

## 2020-11-03 ENCOUNTER — Inpatient Hospital Stay: Payer: Medicare Other | Admitting: Nurse Practitioner

## 2020-11-07 ENCOUNTER — Encounter: Payer: Self-pay | Admitting: General Surgery

## 2020-11-12 ENCOUNTER — Encounter: Payer: Self-pay | Admitting: Family Medicine

## 2020-11-14 ENCOUNTER — Other Ambulatory Visit: Payer: Self-pay

## 2020-11-14 ENCOUNTER — Encounter: Payer: Self-pay | Admitting: Family Medicine

## 2020-11-14 ENCOUNTER — Ambulatory Visit (INDEPENDENT_AMBULATORY_CARE_PROVIDER_SITE_OTHER): Payer: Medicare Other | Admitting: Family Medicine

## 2020-11-14 VITALS — BP 183/65 | HR 69 | Ht 60.0 in | Wt 131.0 lb

## 2020-11-14 DIAGNOSIS — R197 Diarrhea, unspecified: Secondary | ICD-10-CM | POA: Diagnosis not present

## 2020-11-14 DIAGNOSIS — K921 Melena: Secondary | ICD-10-CM | POA: Diagnosis not present

## 2020-11-14 DIAGNOSIS — I700 Atherosclerosis of aorta: Secondary | ICD-10-CM

## 2020-11-14 DIAGNOSIS — R195 Other fecal abnormalities: Secondary | ICD-10-CM | POA: Diagnosis not present

## 2020-11-14 MED ORDER — DICYCLOMINE HCL 10 MG PO CAPS: 10.0000 mg | ORAL_CAPSULE | Freq: Three times a day (TID) | ORAL | 2 refills | Status: DC

## 2020-11-14 NOTE — Patient Instructions (Addendum)
Thank you for coming to the office today.  Stop Aspirin 81mg  for now due to dark stools. It may be from colon or digestive system track causing some bleeding. Ask Cardiology about aspirin as well  [facility]) [place], [place] [address] Phone: [phone]  -----------------------------------  OTC Peppermint Oil (Triple Coated Capsule) 180mg  take one 3 times daily to reduce diarrhea  May try Imodium OTC as well for bowel urgency only take IF more SEVERE - don't use multiple times or days in a row.  Try Dicyclomine (Bentyl) 10mg  as needed for loose stool and abdominal cramping - can take with eating / meals   Please schedule a Follow-up Appointment to: Return in about 8 weeks (around 01/09/2021) for about 8 weeks 1st week of December follow-up DM A1c, Foot Exam / GI updates.  If you have any other questions or concerns, please feel free to call the office or send a message through [place]. You may also schedule an earlier appointment if necessary.  Additionally, you may be receiving a survey about your experience at our office within a few days to 1 week by e-mail or mail. We value your feedback.  [name], DO [place]

## 2020-11-14 NOTE — Progress Notes (Signed)
Subjective:    Patient ID: Ellen [name], female    DOB: 1929/07/05, 87 y.o.   MRN: [id number]  Ellen [name] is a 87 y.o. female presenting on 11/14/2020 for Diarrhea and dark stools   HPI  Dark Stools, concern Melena Diarrhea  Reports symptoms onset few weeks ago, with loose stool diarrhea and dark stool, she did not notice any blood or red blood mixed in stool. She denies any abdominal pain.  Started pepto bismol as needed with some relief, and the diarrhea has improved. She still has dark stools. Now off pepto still has dark stool Has upcoming Cardiology apt - still on Aspirin. Not having abdominal pain nausea vomiting or other symptoms.  Atherosclerosis Aorta Identified on prior imaging. On Statin.  Depression screen [facility] 2/9 04/23/2019 09/21/2018 04/12/2018  Decreased Interest 0 0 0  Down, Depressed, Hopeless 0 0 0  PHQ - 2 Score 0 0 0    Social History   Tobacco Use   Smoking status: Never   Smokeless tobacco: Never  Vaping Use   Vaping Use: Never used  Substance Use Topics   Alcohol use: Never   Drug use: Never    Review of Systems Per HPI unless specifically indicated above     Objective:    BP (!) 183/65   Pulse 69   Ht 5' (1.524 m)   Wt 131 lb (59.4 kg)   SpO2 99%   BMI 25.58 kg/m   Wt Readings from Last 3 Encounters:  11/14/20 131 lb (59.4 kg)  08/19/20 133 lb 3.2 oz (60.4 kg)  08/09/20 143 lb (64.9 kg)    Physical Exam Vitals and nursing note reviewed.  Constitutional:      General: She is not in acute distress.    Appearance: She is well-developed. She is not diaphoretic.     Comments: Well-appearing, comfortable, cooperative  HENT:     Head: Normocephalic and atraumatic.  Eyes:     General:        Right eye: No discharge.        Left eye: No discharge.     Conjunctiva/sclera: Conjunctivae normal.  Neck:     Thyroid: No thyromegaly.  Cardiovascular:     Rate and Rhythm: Normal rate and regular rhythm.     Heart sounds: Normal  heart sounds. No murmur heard. Pulmonary:     Effort: Pulmonary effort is normal. No respiratory distress.     Breath sounds: Normal breath sounds. No wheezing or rales.  Abdominal:     General: Bowel sounds are normal. There is no distension.     Palpations: Abdomen is soft. There is no mass.     Tenderness: There is no abdominal tenderness.  Musculoskeletal:        General: Normal range of motion.     Cervical back: Normal range of motion and neck supple.  Lymphadenopathy:     Cervical: No cervical adenopathy.  Skin:    General: Skin is warm and dry.     Findings: No erythema or rash.  Neurological:     Mental Status: She is alert and oriented to person, place, and time.  Psychiatric:        Behavior: Behavior normal.     Comments: Well groomed, good eye contact, normal speech and thoughts    CT ABDOMEN PELVIS W CONTRASTPerformed 09/27/2019 Final result  Study Result CLINICAL DATA: Left upper abdominal pain. Recent fall.  EXAM: CT ABDOMEN AND PELVIS WITH CONTRAST  TECHNIQUE: Multidetector CT  imaging of the abdomen and pelvis was performed using the standard protocol following bolus administration of intravenous contrast.  CONTRAST: 122mL OMNIPAQUE IOHEXOL 300 MG/ML SOLN  COMPARISON: 08/13/2019  FINDINGS: Lower chest: No acute abnormality  Hepatobiliary: No focal liver abnormality is seen. Status post cholecystectomy. No biliary dilatation.  Pancreas: No focal abnormality or ductal dilatation.  Spleen: Splenectomy  Adrenals/Urinary Tract: No suspicious focal renal or adrenal lesion. No hydronephrosis. Urinary bladder unremarkable.  Stomach/Bowel: Stomach, large and small bowel grossly unremarkable.  Vascular/Lymphatic: Aortic atherosclerosis. No aneurysm or adenopathy.  Reproductive: Prior hysterectomy. No adnexal masses.  Other: No free fluid or free air.  Musculoskeletal: Degenerative changes throughout the lumbar spine. No acute bony  abnormality.  IMPRESSION: No acute findings in the abdomen or pelvis.  Aortic atherosclerosis.   Electronically Signed By: Rolm Baptise M.D. On: 09/27/2019 15:18    Results for orders placed or performed during the hospital encounter of 98/92/11  Basic metabolic panel  Result Value Ref Range   Sodium 138 135 - 145 mmol/L   Potassium 4.4 3.5 - 5.1 mmol/L   Chloride 106 98 - 111 mmol/L   CO2 26 22 - 32 mmol/L   Glucose, Bld 88 70 - 99 mg/dL   BUN 19 8 - 23 mg/dL   Creatinine, Ser 1.21 (H) 0.44 - 1.00 mg/dL   Calcium 8.8 (L) 8.9 - 10.3 mg/dL   GFR, Estimated 43 (L) >60 mL/min   Anion gap 6 5 - 15  CBC  Result Value Ref Range   WBC 4.8 4.0 - 10.5 K/uL   RBC 3.82 (L) 3.87 - 5.11 MIL/uL   Hemoglobin 10.7 (L) 12.0 - 15.0 g/dL   HCT 33.1 (L) 36.0 - 46.0 %   MCV 86.6 80.0 - 100.0 fL   MCH 28.0 26.0 - 34.0 pg   MCHC 32.3 30.0 - 36.0 g/dL   RDW 16.4 (H) 11.5 - 15.5 %   Platelets 281 150 - 400 K/uL   nRBC 0.0 0.0 - 0.2 %  Urinalysis, Complete w Microscopic Urine, Clean Catch  Result Value Ref Range   Color, Urine STRAW (A) YELLOW   APPearance CLEAR (A) CLEAR   Specific Gravity, Urine 1.011 1.005 - 1.030   pH 7.0 5.0 - 8.0   Glucose, UA NEGATIVE NEGATIVE mg/dL   Hgb urine dipstick NEGATIVE NEGATIVE   Bilirubin Urine NEGATIVE NEGATIVE   Ketones, ur NEGATIVE NEGATIVE mg/dL   Protein, ur NEGATIVE NEGATIVE mg/dL   Nitrite NEGATIVE NEGATIVE   Leukocytes,Ua NEGATIVE NEGATIVE   RBC / HPF 0-5 0 - 5 RBC/hpf   WBC, UA 0-5 0 - 5 WBC/hpf   Bacteria, UA NONE SEEN NONE SEEN   Squamous Epithelial / LPF 0-5 0 - 5   Mucus PRESENT   CBG monitoring, ED  Result Value Ref Range   Glucose-Capillary 61 (L) 70 - 99 mg/dL  Troponin I (High Sensitivity)  Result Value Ref Range   Troponin I (High Sensitivity) 9 <18 ng/L      Assessment & Plan:   Problem List Items Addressed This Visit     Aortic atherosclerosis ([place])   Other Visit Diagnoses     Dark stools    -  Primary   Relevant  Orders   Ambulatory referral to Gastroenterology   Melena       Relevant Orders   Ambulatory referral to Gastroenterology   Diarrhea, unspecified type       Relevant Medications   dicyclomine (BENTYL) 10 MG capsule   Other  Relevant Orders   Ambulatory referral to Gastroenterology      Atherosclerosis Aorta Seen on imaging CT On Statin  Dark stools / suspected melena On Aspirin we will HOLD this for now, she can discuss with Cardiology upcoming apt. Likely cause of diarrhea  Start dicyclomine PRN for cramping diarrhea OTC Peppermint Oil (Triple Coated Capsule) 180mg  take one 3 times daily to reduce diarrhea OTC Imodium rare use PRN if need  Refer to GI for consultation to [facility] for dark stools / concern with anemia likely persistent GI Bleed, however age 66 with multiple medication conditions will need to review treatment options.    Orders Placed This Encounter  Procedures   Ambulatory referral to Gastroenterology    Referral Priority:   Routine    Referral Type:   Consultation    Referral Reason:   Specialty Services Required    Number of Visits Requested:   1     Meds ordered this encounter  Medications   dicyclomine (BENTYL) 10 MG capsule    Sig: Take 1 capsule (10 mg total) by mouth 4 (four) times daily -  before meals and at bedtime. As needed for abdominal cramp and diarrhea    Dispense:  60 capsule    Refill:  2      Follow up plan: Return in about 8 weeks (around 01/09/2021) for about 8 weeks 1st week of December follow-up DM A1c, Foot Exam / GI updates.   [name], [facility] 11/14/2020, 11:10 AM

## 2020-11-17 ENCOUNTER — Inpatient Hospital Stay: Payer: Medicare Other | Admitting: Nurse Practitioner

## 2020-12-09 ENCOUNTER — Ambulatory Visit: Payer: Medicare Other | Admitting: Cardiovascular Disease

## 2021-01-13 ENCOUNTER — Ambulatory Visit: Payer: Medicare Other | Admitting: Cardiovascular Disease

## 2021-01-13 NOTE — Progress Notes (Deleted)
°  °Cardiology Office Note ° ° °Date:  01/13/2021  ° °ID:  Ellen [name], DOB 09/11/1929, MRN [id number] ° °PCP:  Karamalegos, Alexander J, DO  °Cardiologist:  *** Muhammad Arida, MD  ° °No chief complaint on file. ° ° °  °History of Present Illness: °Ellen [name] is a 87 y.o. female who presents for *** ° ° ° °Past Medical History:  °Diagnosis Date  ° Arthritis   ° Cancer (HCC) 01/2018  ° left breast   ° Cataract   ° Chronic headaches   ° CKD (chronic kidney disease)   ° Diabetes mellitus without complication (HCC)   ° Diverticulitis   ° Family history of ovarian cancer   ° History of syncope   ° Hyperlipidemia   ° Hypertension   ° Multinodular goiter   ° Personal history of radiation therapy   ° Primary osteoarthritis of left knee   ° Pulmonary hypertension (HCC)   ° ° °Past Surgical History:  °Procedure Laterality Date  ° ABDOMINAL HYSTERECTOMY    ° APPENDECTOMY    ° BREAST BIOPSY Left 01/03/2018  ° left breast stereo/x clip/positive  ° BREAST LUMPECTOMY Left 01/12/2018  ° BREAST LUMPECTOMY WITH NEEDLE LOCALIZATION Left 01/12/2018  ° Procedure: BREAST LUMPECTOMY WITH NEEDLE LOCALIZATION;  Surgeon: Pabon, Diego F, MD;  Location: [facility] ORS;  Service: General;  Laterality: Left;  ° CHOLECYSTECTOMY    ° COLON SURGERY    ° COLOSTOMY  2008  ° COLOSTOMY REVERSAL  2008  ° EYE SURGERY    ° REPLACEMENT TOTAL KNEE Right 2018  ° SENTINEL NODE BIOPSY Left 03/07/2018  ° Procedure: SENTINEL NODE BIOPSY;  Surgeon: Pabon, Diego F, MD;  Location: [facility] ORS;  Service: General;  Laterality: Left;  ° ° ° °Current Outpatient Medications  °Medication Sig Dispense Refill  ° acetaminophen (TYLENOL) 500 MG tablet Take 1,000 mg by mouth every 6 (six) hours as needed for moderate pain or headache.     ° amLODipine (NORVASC) 5 MG tablet Take 2 tablets (10 mg total) by mouth daily. 90 tablet 1  ° aspirin 81 MG chewable tablet Chew 81 mg by mouth daily.     ° baclofen (LIORESAL) 10 MG tablet Take 0.5-1 tablets (5-10 mg total) by mouth 3  (three) times daily as needed for muscle spasms. 60 each 2  ° Blood Glucose Monitoring Suppl (ONE TOUCH ULTRA 2) w/Device KIT Use to check blood sugar daily, up to 2 times if need 1 each 0  ° cloNIDine (CATAPRES) 0.1 MG tablet TAKE 1 TABLET BY MOUTH DAILY AS NEEDED FOR ELEVATED BLOOD PRESSURE GREATER THAN 180/100/HEADACHE 30 tablet 2  ° diclofenac Sodium (VOLTAREN) 1 % GEL Apply 2 g topically 4 (four) times daily as needed (arthritis knee, elbow). 100 g 2  ° dicyclomine (BENTYL) 10 MG capsule Take 1 capsule (10 mg total) by mouth 4 (four) times daily -  before meals and at bedtime. As needed for abdominal cramp and diarrhea 60 capsule 2  ° gabapentin (NEURONTIN) 300 MG capsule TAKE 1 CAPSULE(300 MG) BY MOUTH IN THE MORNING AND AT BEDTIME 180 capsule 2  ° Garlic 100 MG TABS Take by mouth.    ° Lancets (ONETOUCH ULTRASOFT) lancets Use to check blood sugar daily, up to 2 times if need 100 each 3  ° lisinopril-hydrochlorothiazide (ZESTORETIC) 20-12.5 MG tablet TAKE 1 TABLET BY MOUTH DAILY 90 tablet 1  ° Multiple Vitamins-Minerals (CERTAVITE SENIOR/ANTIOXIDANT) TABS Take by mouth.    ° ONE TOUCH ULTRA TEST test strip Use   to check blood sugar daily, up to 2 times if need 100 each 3  ° pravastatin (PRAVACHOL) 20 MG tablet TAKE 1 TABLET(20 MG) BY MOUTH AT BEDTIME 90 tablet 3  ° triamcinolone cream (KENALOG) 0.5 % Apply 1 application topically 2 (two) times daily. To affected areas, for up to 2 weeks. 30 g 0  ° °No current facility-administered medications for this visit.  ° ° °Allergies:   Codeine and Morphine and related  ° ° °Social History:  The patient  reports that she has never smoked. She has never used smokeless tobacco. She reports that she does not drink alcohol and does not use drugs.  ° °Family History:  The patient's ***family history includes Diabetes in her father; Heart disease in her daughter; Hypertension in her maternal aunt, maternal grandmother, and maternal uncle; Lung cancer (age of onset: 72) in her  mother; Ovarian cancer (age of onset: 59) in her maternal aunt; Stroke in her father.  ° ° °ROS:  Please see the history of present illness.   Otherwise, review of systems are positive for {NONE DEFAULTED:18576}.   All other systems are reviewed and negative.  ° ° °PHYSICAL EXAM: °VS:  There were no vitals taken for this visit. , BMI There is no height or weight on file to calculate BMI. °GEN: Well nourished, well developed, in no acute distress  °HEENT: normal  °Neck: no JVD, carotid bruits, or masses °Cardiac: ***RRR; no murmurs, rubs, or gallops,no edema  °Respiratory:  clear to auscultation bilaterally, normal work of breathing °GI: soft, nontender, nondistended, + BS °MS: no deformity or atrophy  °Skin: warm and dry, no rash °Neuro:  Strength and sensation are intact °Psych: euthymic mood, full affect ° ° °EKG:  EKG {ACTION; IS/IS NOT:21021397} ordered today. °The ekg ordered today demonstrates *** ° ° °Recent Labs: °08/09/2020: BUN 19; Creatinine, Ser 1.21; Hemoglobin 10.7; Platelets 281; Potassium 4.4; Sodium 138  ° ° °Lipid Panel °No results found for: CHOL, TRIG, HDL, CHOLHDL, VLDL, LDLCALC, LDLDIRECT °  ° °Wt Readings from Last 3 Encounters:  °11/14/20 131 lb (59.4 kg)  °08/19/20 133 lb 3.2 oz (60.4 kg)  °08/09/20 143 lb (64.9 kg)  °  ° ° °Other studies Reviewed: °Additional studies/ records that were reviewed today include: ***. °Review of the above records demonstrates: *** ° °No flowsheet data found. ° ° ° °ASSESSMENT AND PLAN: ° °1.  *** ° ° ° °Disposition:   FU with *** in {gen number 0-10:310397} {Days to years:10300} ° °Signed, ° °Muhammad Arida, MD  °01/13/2021 2:58 PM    °[facility] ° °

## 2021-01-14 ENCOUNTER — Encounter: Payer: Self-pay | Admitting: Cardiovascular Disease

## 2021-01-30 ENCOUNTER — Telehealth: Payer: Self-pay | Admitting: Oncology

## 2021-01-30 NOTE — Telephone Encounter (Signed)
Caregiver would like to make appt for pt. Wants to make sure that cancer has not come back in breast. Call back at [phone]

## 2021-02-12 ENCOUNTER — Ambulatory Visit: Payer: Self-pay | Admitting: *Deleted

## 2021-02-12 NOTE — Telephone Encounter (Signed)
°  Chief Complaint: left upper breast pain worsening request appt  Symptoms: pain left upper breast where incision located from breast CA  Frequency: 2 months ago  Pertinent Negatives: Patient denies redness, fever, chest pain, difficulty breathing , no dizziness, no shortness of breath Disposition: [] ED /[] Urgent Care (no appt availability in office) / [x] Appointment(In office/virtual)/ []  [facility]/ [] Home Care/ [] Refused Recommended Disposition /[] [facility]/ []  Follow-up with PCP Additional Notes:  Hx breast CA . Incisional pain reported. Appt 02/16/21. taking tylenol with little relief.     Reason for Disposition  [1] Breast pain AND [2] cause is not known  Answer Assessment - Initial Assessment Questions 1. SYMPTOM: "What's the main symptom you're concerned about?"  (e.g., lump, pain, rash, nipple discharge)     Left breast pain started where incision located upper breast from breast CA 2. LOCATION: "Where is the pain  located?"     Upper left breast  3. ONSET: "When did pain   start?"     A few months ago approx . 2 months  4. PRIOR HISTORY: "Do you have any history of prior problems with your breasts?" (e.g., lumps, cancer, fibrocystic breast disease)     Breast CA with surgery x a couple of years  5. CAUSE: "What do you think is causing this symptom?"     Pain to incision are  6. OTHER SYMPTOMS: "Do you have any other symptoms?" (e.g., fever, breast pain, redness or rash, nipple discharge)     Breast pain where incision is  7. PREGNANCY-BREASTFEEDING: "Is there any chance you are pregnant?" "When was your last menstrual period?" "Are you breastfeeding?"     na  Protocols used: Breast Symptoms-A-AH

## 2021-02-16 ENCOUNTER — Other Ambulatory Visit: Payer: Self-pay

## 2021-02-16 ENCOUNTER — Telehealth: Payer: Self-pay

## 2021-02-16 ENCOUNTER — Ambulatory Visit (INDEPENDENT_AMBULATORY_CARE_PROVIDER_SITE_OTHER): Payer: Medicare Other | Admitting: Family Medicine

## 2021-02-16 ENCOUNTER — Encounter: Payer: Self-pay | Admitting: Family Medicine

## 2021-02-16 VITALS — BP 172/78 | HR 64 | Ht 60.0 in | Wt 139.2 lb

## 2021-02-16 DIAGNOSIS — E1121 Type 2 diabetes mellitus with diabetic nephropathy: Secondary | ICD-10-CM

## 2021-02-16 DIAGNOSIS — E1169 Type 2 diabetes mellitus with other specified complication: Secondary | ICD-10-CM | POA: Diagnosis not present

## 2021-02-16 DIAGNOSIS — E785 Hyperlipidemia, unspecified: Secondary | ICD-10-CM | POA: Diagnosis not present

## 2021-02-16 DIAGNOSIS — R519 Headache, unspecified: Secondary | ICD-10-CM

## 2021-02-16 DIAGNOSIS — N3000 Acute cystitis without hematuria: Secondary | ICD-10-CM

## 2021-02-16 DIAGNOSIS — G8929 Other chronic pain: Secondary | ICD-10-CM

## 2021-02-16 DIAGNOSIS — I10 Essential (primary) hypertension: Secondary | ICD-10-CM | POA: Diagnosis not present

## 2021-02-16 DIAGNOSIS — C50412 Malignant neoplasm of upper-outer quadrant of left female breast: Secondary | ICD-10-CM

## 2021-02-16 LAB — POCT URINALYSIS DIPSTICK
Bilirubin, UA: NEGATIVE
Blood, UA: NEGATIVE
Glucose, UA: NEGATIVE
Ketones, UA: NEGATIVE
Leukocytes, UA: NEGATIVE
Nitrite, UA: NEGATIVE
Protein, UA: POSITIVE — AB
Spec Grav, UA: 1.02 (ref 1.010–1.025)
Urobilinogen, UA: 0.2 E.U./dL
pH, UA: 6 (ref 5.0–8.0)

## 2021-02-16 MED ORDER — AMLODIPINE BESYLATE 10 MG PO TABS: 10.0000 mg | ORAL_TABLET | Freq: Every day | ORAL | 3 refills | Status: DC

## 2021-02-16 MED ORDER — CIPROFLOXACIN HCL 500 MG PO TABS: 500.0000 mg | ORAL_TABLET | Freq: Two times a day (BID) | ORAL | 0 refills | Status: AC

## 2021-02-16 MED ORDER — LISINOPRIL-HYDROCHLOROTHIAZIDE 20-12.5 MG PO TABS: 1.0000 | ORAL_TABLET | Freq: Every day | ORAL | 3 refills | Status: DC

## 2021-02-16 MED ORDER — PRAVASTATIN SODIUM 20 MG PO TABS: 20.0000 mg | ORAL_TABLET | Freq: Every day | ORAL | 3 refills | Status: DC

## 2021-02-16 MED ORDER — GABAPENTIN 300 MG PO CAPS: ORAL_CAPSULE | ORAL | 3 refills | Status: DC

## 2021-02-16 NOTE — Patient Instructions (Addendum)
Thank you for coming to the office today.  With concerns on Left breast pain, we need to return to Oncology  Go ahead and call and schedule again with Dr Grayland Ormond OR Beckey Rutter PA  [facility] (Hematology/Oncology) [address] [place], [place] [address] Phone: [phone]  --------  Elevated BP today, we will restart medication for blood pressure Likely elevated BP causing the dizziness.  Our chronic care nursing / social work / medicine team will call you with more information, they are here to help.   Please schedule a Follow-up Appointment to: Return in about 6 weeks (around 03/30/2021) for 6 week follow-up HTN, DM A1c Foot.  If you have any other questions or concerns, please feel free to call the office or send a message through [place]. You may also schedule an earlier appointment if necessary.  Additionally, you may be receiving a survey about your experience at our office within a few days to 1 week by e-mail or mail. We value your feedback.  [name], DO [facility]

## 2021-02-16 NOTE — Chronic Care Management (AMB) (Signed)
°  Chronic Care Management   Outreach Note  02/16/2021 Name: Ellen [name] MRN: [id number] DOB: [birth year]  [name] is a 88 y.o. year old female who is a primary care patient of [name], DO. I reached out to [employer] by phone today in response to a referral sent by [name]'s primary care provider.  An unsuccessful telephone outreach was attempted today. The patient was referred to the case management team for assistance with care management and care coordination.   Follow Up Plan: A HIPAA compliant phone message was left for the patient providing contact information and requesting a return call.  The care management team will reach out to the patient again over the next 5 days.  If patient returns call to provider office, please advise to call [facility] [place], [place], [place] [facility]  [name], [place] [address] Direct Dial: [phone] [email or web address]: [email or web address]

## 2021-02-16 NOTE — Progress Notes (Signed)
Subjective:    Patient ID: Ellen [name], female    DOB: Apr 18, 1929, 88 y.o.   MRN: [id number]  Ellen [name] is a 88 y.o. female presenting on 02/16/2021 for Abdominal Pain and Breast Pain   HPI  Medication Adherence / Recurrent Falls She reports today, out of all meds and has had dizziness and occasional falls, with high BP readings. Previously in contact with CCM team back in 2021.  CHRONIC HTN: Reports elevated BP. Not checking BP out of meds for period of time unsure Current Meds - Off Amlodipine 10mg  daily, Lisinopril-HCTZ 20-12.5mg  daily Did not take meds today. Denies CP, dyspnea, HA, edema, dizziness / lightheadedness  Left Breast Pain History of breast cancer Lost to follow-up to Oncology, it looks like she had 4-5 re-schedule cancelled apt since September 2022.  Dysuria / Abdominal discomfort UTI Reports symptoms of UTI with dysuria frequency and some abdominal discomfort  Depression screen [facility] 2/9 02/16/2021 04/23/2019 09/21/2018  Decreased Interest 0 0 0  Down, Depressed, Hopeless 0 0 0  PHQ - 2 Score 0 0 0  Altered sleeping 0 - -  Tired, decreased energy 0 - -  Change in appetite 0 - -  Feeling bad or failure about yourself  0 - -  Trouble concentrating 0 - -  Moving slowly or fidgety/restless 0 - -  Suicidal thoughts 0 - -  PHQ-9 Score 0 - -  Difficult doing work/chores Not difficult at all - -    Social History   Tobacco Use   Smoking status: Never   Smokeless tobacco: Never  Vaping Use   Vaping Use: Never used  Substance Use Topics   Alcohol use: Never   Drug use: Never    Review of Systems Per HPI unless specifically indicated above     Objective:    BP (!) 172/78 (BP Location: Left Arm, Cuff Size: Normal)    Pulse 64    Ht 5' (1.524 m)    Wt 139 lb 3.2 oz (63.1 kg)    SpO2 97%    BMI 27.19 kg/m   Wt Readings from Last 3 Encounters:  02/16/21 139 lb 3.2 oz (63.1 kg)  11/14/20 131 lb (59.4 kg)  08/19/20 133 lb 3.2 oz (60.4 kg)     Physical Exam Vitals and nursing note reviewed.  Constitutional:      General: She is not in acute distress.    Appearance: Normal appearance. She is well-developed. She is not diaphoretic.     Comments: Well-appearing, comfortable, cooperative  HENT:     Head: Normocephalic and atraumatic.  Eyes:     General:        Right eye: No discharge.        Left eye: No discharge.     Conjunctiva/sclera: Conjunctivae normal.  Neck:     Thyroid: No thyromegaly.  Cardiovascular:     Rate and Rhythm: Normal rate and regular rhythm.     Heart sounds: Normal heart sounds. No murmur heard. Pulmonary:     Effort: Pulmonary effort is normal. No respiratory distress.     Breath sounds: Normal breath sounds. No wheezing or rales.  Abdominal:     Tenderness: There is no abdominal tenderness. There is no guarding or rebound.  Musculoskeletal:        General: Normal range of motion.     Cervical back: Normal range of motion and neck supple.  Lymphadenopathy:     Cervical: No cervical adenopathy.  Skin:  General: Skin is warm and dry.     Findings: No erythema or rash.  Neurological:     Mental Status: She is alert and oriented to person, place, and time.  Psychiatric:        Mood and Affect: Mood normal.        Behavior: Behavior normal.        Thought Content: Thought content normal.     Comments: Well groomed, good eye contact, normal speech and thoughts   Results for orders placed or performed in visit on 02/16/21  POCT urinalysis dipstick  Result Value Ref Range   Color, UA Yellow    Clarity, UA Clear    Glucose, UA Negative Negative   Bilirubin, UA Negative    Ketones, UA Negative    Spec Grav, UA 1.020 1.010 - 1.025   Blood, UA Negative    pH, UA 6.0 5.0 - 8.0   Protein, UA Positive (A) Negative   Urobilinogen, UA 0.2 0.2 or 1.0 E.U./dL   Nitrite, UA Negative    Leukocytes, UA Negative Negative   Appearance     Odor        Assessment & Plan:   Problem List Items  Addressed This Visit     Type 2 diabetes mellitus with diabetic nephropathy, without long-term current use of insulin (HCC)   Relevant Medications   lisinopril-hydrochlorothiazide (ZESTORETIC) 20-12.5 MG tablet   pravastatin (PRAVACHOL) 20 MG tablet   Other Relevant Orders   AMB Referral to [place]   Primary cancer of upper outer quadrant of left female breast (HCC)   Relevant Medications   ciprofloxacin (CIPRO) 500 MG tablet   Other Relevant Orders   AMB Referral to [facility]   Hyperlipidemia associated with type 2 diabetes mellitus (HCC)   Relevant Medications   amLODipine (NORVASC) 10 MG tablet   lisinopril-hydrochlorothiazide (ZESTORETIC) 20-12.5 MG tablet   pravastatin (PRAVACHOL) 20 MG tablet   Other Relevant Orders   AMB Referral to [place]   Essential hypertension - Primary   Relevant Medications   amLODipine (NORVASC) 10 MG tablet   lisinopril-hydrochlorothiazide (ZESTORETIC) 20-12.5 MG tablet   pravastatin (PRAVACHOL) 20 MG tablet   Other Relevant Orders   AMB Referral to [facility]   Chronic headaches   Relevant Medications   amLODipine (NORVASC) 10 MG tablet   gabapentin (NEURONTIN) 300 MG capsule   Other Relevant Orders   AMB Referral to [place]   Other Visit Diagnoses     Acute cystitis without hematuria       Relevant Medications   ciprofloxacin (CIPRO) 500 MG tablet   Other Relevant Orders   POCT urinalysis dipstick (Completed)   Urine Culture       UTI Suspected cause with her dysuria and similar history Order urine dipstick / culture Start Cipro 500 BID x 7 days empiric broader coverage  Refill Gabapentin for chronic headaches  HTN Uncontrolled BP elevated SBP >200 off medications, can be cause of headache and dizziness Will re order Lisinopril-HCTZ 20-12.5mg  daily and Amlodipine 10mg  daily  HLD Re order statin, pravastatin, was out of med  Left breast  pain / Breast cancer Urged her to re-schedule and keep apt with [facility] CC Dr Grayland Ormond or Beckey Rutter NP, as she has missed up to 4-5 apt recently.  Will place CCM referral due to various issues, ran out of meds, med admin, keeping apt and managing chronic health conditions.  Orders Placed This Encounter  Procedures   Urine Culture   AMB Referral to [facility]    Referral Priority:   Routine    Referral Type:   Consultation    Referral Reason:   Care Coordination    Number of Visits Requested:   1   POCT urinalysis dipstick     Meds ordered this encounter  Medications   amLODipine (NORVASC) 10 MG tablet    Sig: Take 1 tablet (10 mg total) by mouth daily.    Dispense:  90 tablet    Refill:  3   lisinopril-hydrochlorothiazide (ZESTORETIC) 20-12.5 MG tablet    Sig: Take 1 tablet by mouth daily.    Dispense:  90 tablet    Refill:  3   pravastatin (PRAVACHOL) 20 MG tablet    Sig: Take 1 tablet (20 mg total) by mouth at bedtime.    Dispense:  90 tablet    Refill:  3   gabapentin (NEURONTIN) 300 MG capsule    Sig: TAKE 1 CAPSULE(300 MG) BY MOUTH IN THE MORNING AND AT BEDTIME    Dispense:  180 capsule    Refill:  3   ciprofloxacin (CIPRO) 500 MG tablet    Sig: Take 1 tablet (500 mg total) by mouth 2 (two) times daily for 7 days.    Dispense:  14 tablet    Refill:  0      Follow up plan: Return in about 6 weeks (around 03/30/2021) for 6 week follow-up HTN, DM A1c Foot.   [name], [facility] 02/16/2021, 10:20 AM

## 2021-02-17 LAB — URINE CULTURE
MICRO NUMBER:: 12875437
SPECIMEN QUALITY:: ADEQUATE

## 2021-02-18 ENCOUNTER — Telehealth: Payer: Self-pay | Admitting: Oncology

## 2021-02-18 NOTE — Telephone Encounter (Signed)
Caregiver called to set up appt with Beckey Rutter. Call back at [phone]

## 2021-02-23 ENCOUNTER — Emergency Department
Admission: EM | Admit: 2021-02-23 | Discharge: 2021-02-23 | Discharge status: Against Medical Advice | Payer: Medicare Other

## 2021-02-23 ENCOUNTER — Telehealth: Payer: Self-pay | Admitting: Oncology

## 2021-02-23 NOTE — Telephone Encounter (Signed)
Caregiver called to make an appt. Call back at [phone].

## 2021-02-26 ENCOUNTER — Inpatient Hospital Stay: Payer: Medicare Other | Attending: Nurse Practitioner | Admitting: Oncology

## 2021-02-26 ENCOUNTER — Other Ambulatory Visit: Payer: Self-pay

## 2021-02-26 VITALS — BP 196/80 | HR 60 | Temp 98.1°F | Resp 16 | Wt 139.5 lb

## 2021-02-26 DIAGNOSIS — Z8041 Family history of malignant neoplasm of ovary: Secondary | ICD-10-CM | POA: Diagnosis not present

## 2021-02-26 DIAGNOSIS — M8580 Other specified disorders of bone density and structure, unspecified site: Secondary | ICD-10-CM | POA: Insufficient documentation

## 2021-02-26 DIAGNOSIS — Z801 Family history of malignant neoplasm of trachea, bronchus and lung: Secondary | ICD-10-CM | POA: Insufficient documentation

## 2021-02-26 DIAGNOSIS — Z923 Personal history of irradiation: Secondary | ICD-10-CM | POA: Insufficient documentation

## 2021-02-26 DIAGNOSIS — E785 Hyperlipidemia, unspecified: Secondary | ICD-10-CM | POA: Insufficient documentation

## 2021-02-26 DIAGNOSIS — Z885 Allergy status to narcotic agent status: Secondary | ICD-10-CM | POA: Insufficient documentation

## 2021-02-26 DIAGNOSIS — Z9049 Acquired absence of other specified parts of digestive tract: Secondary | ICD-10-CM | POA: Diagnosis not present

## 2021-02-26 DIAGNOSIS — C50412 Malignant neoplasm of upper-outer quadrant of left female breast: Secondary | ICD-10-CM | POA: Insufficient documentation

## 2021-02-26 DIAGNOSIS — Z823 Family history of stroke: Secondary | ICD-10-CM | POA: Insufficient documentation

## 2021-02-26 DIAGNOSIS — Z833 Family history of diabetes mellitus: Secondary | ICD-10-CM | POA: Diagnosis not present

## 2021-02-26 DIAGNOSIS — Z170 Estrogen receptor positive status [ER+]: Secondary | ICD-10-CM | POA: Diagnosis not present

## 2021-02-26 DIAGNOSIS — I10 Essential (primary) hypertension: Secondary | ICD-10-CM | POA: Diagnosis not present

## 2021-02-26 DIAGNOSIS — E119 Type 2 diabetes mellitus without complications: Secondary | ICD-10-CM | POA: Diagnosis not present

## 2021-02-26 DIAGNOSIS — Z79899 Other long term (current) drug therapy: Secondary | ICD-10-CM | POA: Insufficient documentation

## 2021-02-26 DIAGNOSIS — Z8249 Family history of ischemic heart disease and other diseases of the circulatory system: Secondary | ICD-10-CM | POA: Insufficient documentation

## 2021-02-26 NOTE — Progress Notes (Signed)
[facility]  [phone] [phone]) [phone]  ID: Ellen [name] OB: 12/01/1929  MR#: [id number]  LNL#:[id number]  Patient Care Team: [name], DO as PCP - General (Family Medicine) Vanita Ingles, RN as Case Manager (General Practice)   CHIEF COMPLAINT: Stage Ia ER/PR positive, HER-2 negative invasive carcinoma of the left upper outer quadrant breast.   INTERVAL HISTORY: Patient last evaluated in clinic in September 2021.  She returns to clinic today as an add-on with complaints of a palpable nodule in her left breast at the site of her lumpectomy scar.  She otherwise feels well.  She has no neurologic complaints.  She denies any recent fevers or illnesses.  She has a good appetite and denies weight loss.  She denies any chest pain, shortness of breath, cough, or hemoptysis.  She denies any nausea, vomiting, constipation, or diarrhea.  She has no urinary complaints.  Patient offers no further specific complaints today.  REVIEW OF SYSTEMS:   Review of Systems  Constitutional: Negative.  Negative for fever, malaise/fatigue and weight loss.  Respiratory: Negative.  Negative for cough, hemoptysis and shortness of breath.   Cardiovascular: Negative.  Negative for chest pain and leg swelling.  Gastrointestinal: Negative.  Negative for abdominal pain, blood in stool and melena.  Genitourinary: Negative.  Negative for dysuria.  Musculoskeletal: Negative.  Negative for back pain.  Skin: Negative.  Negative for rash.  Neurological: Negative.  Negative for focal weakness, weakness and headaches.  Psychiatric/Behavioral: Negative.  The patient is not nervous/anxious.    As per HPI. Otherwise, a complete review of systems is negative.  PAST MEDICAL HISTORY: Past Medical History:  Diagnosis Date   Arthritis    Cancer ([place]) 01/2018   left breast    Cataract    Chronic headaches    CKD (chronic kidney disease)    Diabetes mellitus without  complication ([place])    Diverticulitis    Family history of ovarian cancer    History of syncope    Hyperlipidemia    Hypertension    Multinodular goiter    Personal history of radiation therapy    Primary osteoarthritis of left knee    Pulmonary hypertension ([place])     PAST SURGICAL HISTORY: Past Surgical History:  Procedure Laterality Date   ABDOMINAL HYSTERECTOMY     APPENDECTOMY     BREAST BIOPSY Left 01/03/2018   left breast stereo/x clip/positive   BREAST LUMPECTOMY Left 01/12/2018   BREAST LUMPECTOMY WITH NEEDLE LOCALIZATION Left 01/12/2018   Procedure: BREAST LUMPECTOMY WITH NEEDLE LOCALIZATION;  Surgeon: Jules Husbands, MD;  Location: [facility] ORS;  Service: General;  Laterality: Left;   CHOLECYSTECTOMY     COLON SURGERY     COLOSTOMY  2008   COLOSTOMY REVERSAL  2008   EYE SURGERY     REPLACEMENT TOTAL KNEE Right 2018   SENTINEL NODE BIOPSY Left 03/07/2018   Procedure: SENTINEL NODE BIOPSY;  Surgeon: Jules Husbands, MD;  Location: [facility] ORS;  Service: General;  Laterality: Left;    FAMILY HISTORY: Family History  Problem Relation Age of Onset   Lung cancer Mother 47   Diabetes Father    Stroke Father    Hypertension Maternal Grandmother    Hypertension Maternal Uncle    Hypertension Maternal Aunt    Ovarian cancer Maternal Aunt 59   Heart disease Daughter     ADVANCED DIRECTIVES (Y/N):  N  HEALTH MAINTENANCE: Social History   Tobacco Use   Smoking status: Never  Smokeless tobacco: Never  Vaping Use   Vaping Use: Never used  Substance Use Topics   Alcohol use: Never   Drug use: Never     Colonoscopy:  PAP:  Bone density:  Lipid panel:  Allergies  Allergen Reactions   Codeine Nausea Only   Morphine And Related Nausea Only    Current Outpatient Medications  Medication Sig Dispense Refill   acetaminophen (TYLENOL) 500 MG tablet Take 1,000 mg by mouth every 6 (six) hours as needed for moderate pain or headache.      amLODipine (NORVASC) 10 MG  tablet Take 1 tablet (10 mg total) by mouth daily. 90 tablet 3   aspirin 81 MG chewable tablet Chew 81 mg by mouth daily.      Blood Glucose Monitoring Suppl (ONE TOUCH ULTRA 2) w/Device KIT Use to check blood sugar daily, up to 2 times if need 1 each 0   cloNIDine (CATAPRES) 0.1 MG tablet TAKE 1 TABLET BY MOUTH DAILY AS NEEDED FOR ELEVATED BLOOD PRESSURE GREATER THAN 180/100/HEADACHE 30 tablet 2   diclofenac Sodium (VOLTAREN) 1 % GEL Apply 2 g topically 4 (four) times daily as needed (arthritis knee, elbow). 100 g 2   dicyclomine (BENTYL) 10 MG capsule Take 1 capsule (10 mg total) by mouth 4 (four) times daily -  before meals and at bedtime. As needed for abdominal cramp and diarrhea 60 capsule 2   gabapentin (NEURONTIN) 300 MG capsule TAKE 1 CAPSULE(300 MG) BY MOUTH IN THE MORNING AND AT BEDTIME 157 capsule 3   Garlic 262 MG TABS Take by mouth.     Lancets (ONETOUCH ULTRASOFT) lancets Use to check blood sugar daily, up to 2 times if need 100 each 3   lisinopril-hydrochlorothiazide (ZESTORETIC) 20-12.5 MG tablet Take 1 tablet by mouth daily. 90 tablet 3   Multiple Vitamins-Minerals (CERTAVITE SENIOR/ANTIOXIDANT) TABS Take by mouth.     ONE TOUCH ULTRA TEST test strip Use to check blood sugar daily, up to 2 times if need 100 each 3   pravastatin (PRAVACHOL) 20 MG tablet Take 1 tablet (20 mg total) by mouth at bedtime. 90 tablet 3   triamcinolone cream (KENALOG) 0.5 % Apply 1 application topically 2 (two) times daily. To affected areas, for up to 2 weeks. 30 g 0   No current facility-administered medications for this visit.    OBJECTIVE: Vitals:   02/26/21 0946  BP: (!) 196/80  Pulse: 60  Resp: 16  Temp: 98.1 F (36.7 C)  SpO2: 99%     Body mass index is 27.24 kg/m.    ECOG FS:0 - Asymptomatic  General: Well-developed, well-nourished, no acute distress. Eyes: Pink conjunctiva, anicteric sclera. HEENT: Normocephalic, moist mucous membranes. Breast: Palpable nodule at the site of left  lumpectomy scar, likely fibrous tissue.  No other palpable nodules. Lungs: No audible wheezing or coughing. Heart: Regular rate and rhythm. Abdomen: Soft, nontender, no obvious distention. Musculoskeletal: No edema, cyanosis, or clubbing. Neuro: Alert, answering all questions appropriately. Cranial nerves grossly intact. Skin: No rashes or petechiae noted. Psych: Normal affect.   LAB RESULTS:  Lab Results  Component Value Date   NA 138 08/09/2020   K 4.4 08/09/2020   CL 106 08/09/2020   CO2 26 08/09/2020   GLUCOSE 88 08/09/2020   BUN 19 08/09/2020   CREATININE 1.21 (H) 08/09/2020   CALCIUM 8.8 (L) 08/09/2020   PROT 8.0 09/27/2019   ALBUMIN 3.4 (L) 09/27/2019   AST 20 09/27/2019   ALT 9 09/27/2019  ALKPHOS 47 09/27/2019   BILITOT 0.9 09/27/2019   GFRNONAA 43 (L) 08/09/2020   GFRAA 55 (L) 09/27/2019    Lab Results  Component Value Date   WBC 4.8 08/09/2020   NEUTROABS 2.4 08/06/2017   HGB 10.7 (L) 08/09/2020   HCT 33.1 (L) 08/09/2020   MCV 86.6 08/09/2020   PLT 281 08/09/2020     STUDIES: No results found.   ASSESSMENT: Stage Ia ER/PR positive, HER-2 negative invasive carcinoma of the left upper outer quadrant breast.    PLAN:  1. Stage Ia ER/PR positive, HER-2 negative invasive carcinoma of the left upper outer quadrant breast: Given patient's advanced age, Oncotype DX was not ordered since patient declined chemotherapy.  Patient completed adjuvant XRT in approximately April 2020.  Patient refuses treatment with letrozole despite acknowledging the increased risk of recurrence.  No intervention is needed at this time.  Patient's last mammogram was in September 2021 and she has agreed for repeat mammogram in the next 1 to 2 weeks.  Return to clinic in 6 months for routine evaluation.   2.  Osteopenia: Patient's most recent bone mineral density on September 08, 2018 reported T score of -1.7.  Previously calcium and vitamin D supplementation was recommended.  Continue  monitoring and evaluation per primary care.    I spent a total of 20 minutes reviewing chart data, face-to-face evaluation with the patient, counseling and coordination of care as detailed above.   Patient expressed understanding and was in agreement with this plan. She also understands that She can call clinic at any time with any questions, concerns, or complaints.      Cancer Staging  Primary cancer of upper outer quadrant of left female breast [facility]) Staging form: Breast, AJCC 8th Edition - Clinical stage from 01/11/2018: Stage IA (cT1b, cN0, cM0, G2, ER+, PR+, HER2-) - Signed by Lloyd Huger, MD on 02/10/2018 Nuclear grade: G2 Histologic grading system: 3 grade system Laterality: Left   Lloyd Huger, MD   02/27/2021 9:47 AM

## 2021-03-03 ENCOUNTER — Ambulatory Visit: Payer: Medicare Other | Attending: Oncology

## 2021-03-05 ENCOUNTER — Ambulatory Visit (INDEPENDENT_AMBULATORY_CARE_PROVIDER_SITE_OTHER): Payer: Medicare Other

## 2021-03-05 ENCOUNTER — Telehealth: Payer: Medicare Other

## 2021-03-05 DIAGNOSIS — E785 Hyperlipidemia, unspecified: Secondary | ICD-10-CM

## 2021-03-05 DIAGNOSIS — E1169 Type 2 diabetes mellitus with other specified complication: Secondary | ICD-10-CM

## 2021-03-05 DIAGNOSIS — E1121 Type 2 diabetes mellitus with diabetic nephropathy: Secondary | ICD-10-CM

## 2021-03-05 DIAGNOSIS — I10 Essential (primary) hypertension: Secondary | ICD-10-CM

## 2021-03-05 DIAGNOSIS — C50412 Malignant neoplasm of upper-outer quadrant of left female breast: Secondary | ICD-10-CM

## 2021-03-05 NOTE — Chronic Care Management (AMB) (Signed)
Chronic Care Management   CCM RN Visit Note  03/05/2021 Name: Ellen [name] MRN: [id number] DOB: [birth year]  Subjective: Ellen [name] is a 88 y.o. year old female who is a primary care patient of [name], DO. The care management team was consulted for assistance with disease management and care coordination needs.    Engaged with patient by telephone for initial visit in response to provider referral for case management and/or care coordination services.   Consent to Services:  The patient was given the following information about Chronic Care Management services today, agreed to services, and gave verbal consent: 1. CCM service includes personalized support from designated clinical staff supervised by the primary care provider, including individualized plan of care and coordination with other care providers 2. 24/7 contact phone numbers for assistance for urgent and routine care needs. 3. Service will only be billed when office clinical staff spend 20 minutes or more in a month to coordinate care. 4. Only one practitioner may furnish and bill the service in a calendar month. 5.The patient may stop CCM services at any time (effective at the end of the month) by phone call to the office staff. 6. The patient will be responsible for cost sharing (co-pay) of up to 20% of the service fee (after annual deductible is met). Patient agreed to services and consent obtained.  Patient agreed to services and verbal consent obtained.   Assessment: Review of patient past medical history, allergies, medications, health status, including review of consultants reports, laboratory and other test data, was performed as part of comprehensive evaluation and provision of chronic care management services.   SDOH (Social Determinants of Health) assessments and interventions performed:  SDOH Interventions    Flowsheet Row Most Recent Value  SDOH Interventions   Food Insecurity Interventions  Intervention Not Indicated  Housing Interventions Intervention Not Indicated  Intimate Partner Violence Interventions Intervention Not Indicated  Physical Activity Interventions Intervention Not Indicated  Stress Interventions Intervention Not Indicated  Social Connections Interventions Intervention Not Indicated  Transportation Interventions Intervention Not Indicated        CCM Care Plan  Allergies  Allergen Reactions   Codeine Nausea Only   Morphine And Related Nausea Only    Outpatient Encounter Medications as of 03/05/2021  Medication Sig   acetaminophen (TYLENOL) 500 MG tablet Take 1,000 mg by mouth every 6 (six) hours as needed for moderate pain or headache.    amLODipine (NORVASC) 10 MG tablet Take 1 tablet (10 mg total) by mouth daily.   aspirin 81 MG chewable tablet Chew 81 mg by mouth daily.    Blood Glucose Monitoring Suppl (ONE TOUCH ULTRA 2) w/Device KIT Use to check blood sugar daily, up to 2 times if need   cloNIDine (CATAPRES) 0.1 MG tablet TAKE 1 TABLET BY MOUTH DAILY AS NEEDED FOR ELEVATED BLOOD PRESSURE GREATER THAN 180/100/HEADACHE   diclofenac Sodium (VOLTAREN) 1 % GEL Apply 2 g topically 4 (four) times daily as needed (arthritis knee, elbow).   dicyclomine (BENTYL) 10 MG capsule Take 1 capsule (10 mg total) by mouth 4 (four) times daily -  before meals and at bedtime. As needed for abdominal cramp and diarrhea   gabapentin (NEURONTIN) 300 MG capsule TAKE 1 CAPSULE(300 MG) BY MOUTH IN THE MORNING AND AT BEDTIME   Garlic 160 MG TABS Take by mouth.   Lancets (ONETOUCH ULTRASOFT) lancets Use to check blood sugar daily, up to 2 times if need   lisinopril-hydrochlorothiazide (ZESTORETIC) 20-12.5 MG tablet Take 1  tablet by mouth daily.   Multiple Vitamins-Minerals (CERTAVITE SENIOR/ANTIOXIDANT) TABS Take by mouth.   ONE TOUCH ULTRA TEST test strip Use to check blood sugar daily, up to 2 times if need   pravastatin (PRAVACHOL) 20 MG tablet Take 1 tablet (20 mg total) by  mouth at bedtime.   triamcinolone cream (KENALOG) 0.5 % Apply 1 application topically 2 (two) times daily. To affected areas, for up to 2 weeks.   No facility-administered encounter medications on file as of 03/05/2021.    Patient Active Problem List   Diagnosis Date Noted   Multinodular goiter 12/25/2019   Aortic atherosclerosis ([place]) 12/18/2019   Genetic testing 10/06/2018   Family history of ovarian cancer    Rotator cuff arthropathy of right shoulder 06/16/2018   Rotator cuff arthropathy of left shoulder 03/13/2018   Chronic pain of both shoulders 03/13/2018   Primary cancer of upper outer quadrant of left female breast ([place]) 01/08/2018   Type 2 diabetes mellitus with diabetic nephropathy, without long-term current use of insulin ([place]) 11/10/2017   Chronic headaches 11/10/2017   Hyperlipidemia associated with type 2 diabetes mellitus ([place]) 11/10/2017   Essential hypertension 11/10/2017   Enteritis 09/18/2017    Conditions to be addressed/monitored:HTN, HLD, DMII, and history of breast cancer   Care Plan : RNCM: General Plan of Care (Adult) For Chronic Disease Management and Care Coordination Needs  Updates made by Vanita Ingles, RN since 03/05/2021 12:00 AM     Problem: RNCM: Development of Plan of Care for Chronic Disease Management (HTN, HLD, DM, History of breast cancer)   Priority: High     Long-Range Goal: RNCM: Effective Management of Plan of Care for Chronic Disease Management (HTN, HLD, DM, History of breast cancer)   Start Date: 03/05/2021  Expected End Date: 03/05/2022  Note:   Current Barriers:  Knowledge Deficits related to plan of care for management of HTN, HLD, DMII, and history of breast cancer (left breast with soreness at site of lumpectomy currently)  Care Coordination needs related to Limited social support, Social Isolation, and Memory Deficits  Chronic Disease Management support and education needs related to HTN, HLD, DMII, and history of breast cancer to  left breast with soreness at the site of lumpectomy currently Lacks caregiver support.         RNCM Clinical Goal(s):  Patient will verbalize basic understanding of HTN, HLD, DMII, and History of breast cancer  disease process and self health management plan as evidenced by keeping appointments, following the plan of care by her providers, calling the office for questions and concerns, and working with the CCM team to effectively manage health and well being  take all medications exactly as prescribed and will call provider for medication related questions as evidenced by compliance with medications, working with pharm D for medications/equipment needs and calling for refills before running out of medications    attend all scheduled medical appointments: saw pcp on 02-16-2021, follow up with specialist in place as evidenced by keeping appointments and calling for schedule change needs        demonstrate improved and ongoing adherence to prescribed treatment plan for HTN, HLD, DMII, and history of breast cancer as evidenced by blood pressure trending downward, lab work stable, blood sugars WNL with the ability to monitor blood sugars consistently, no reoccurrence of breast cancer- currently the patient is experiencing some soreness to left breast at the site of the lumpectomy continue to work with Consulting civil engineer and/or Education officer, museum  to address care management and care coordination needs related to HTN, HLD, DMII, and history of breast cancer as evidenced by adherence to CM Team Scheduled appointments     work with pharmacist to address Medication procurement and the need for a functional blood glucose testing device related to HTN, HLD, DMII, and history of breast cancer as evidenced by review of EMR and patient or pharmacist report    demonstrate ongoing self health care management ability for effective management of chronic conditions as evidenced by working with the CCM team  through collaboration with  Consulting civil engineer, provider, and care team.   Interventions: 1:1 collaboration with primary care provider regarding development and update of comprehensive plan of care as evidenced by provider attestation and co-signature Inter-disciplinary care team collaboration (see longitudinal plan of care) Evaluation of current treatment plan related to  self management and patient's adherence to plan as established by provider   Diabetes:  (Status: Goal on Track (progressing): YES.) Long Term Goal   Lab Results  Component Value Date   HGBA1C 5.8 (A) 09/24/2019    Assessed patient's understanding of A1c goal: <7% Provided education to patient about basic DM disease process; Reviewed medications with patient and discussed importance of medication adherence. 03-05-2021: The patient states she is taking medications as prescribed. She is having some affordability concerns. Scheduled to talk to the pharm D tomorrow. Will collaborate with the pharm D concerning expressed needs.         Reviewed prescribed diet with patient heart healthy/ADA diet. 03-05-2021: The patient states that she pretty much eats what she wants to eat. Review of heart healthy/ADA diet and monitoring for salt, fats, and sugars in her diet. Will send educational material through my chart and the EMMI system; Counseled on importance of regular laboratory monitoring as prescribed. 03-05-2021: need an up to date A1C, will see about getting the patient a follow up with the pcp.         Discussed plans with patient for ongoing care management follow up and provided patient with direct contact information for care management team;      Provided patient with written educational materials related to hypo and hyperglycemia and importance of correct treatment. 03-05-2021: Has not been checking her blood sugars as she does not have a glucose meter that is functional. Discussed options with the patient and she will discuss this with the pharm D on 03-06-2021.  She  denies any shaking, confusion, light headedness, dizziness, or sx and sx of hyperglycemia;       Reviewed scheduled/upcoming provider appointments including: saw pcp on 02-16-2021, encouraged the patient to follow up with the pcp on a regular basis;         Advised patient, providing education and rationale, to check cbg twice daily, when you have symptoms of low or high blood sugar, and before and after exercise and record. 03-05-2021: The patient is open to checking blood sugars and is interested in a new device. Has 2 non-working devices.       call provider for findings outside established parameters;       Referral made to pharmacy team for assistance with medication reconciliation and help with a glucose meter that her insurance will pay for due to cost constraints and concerns- she currently has 2 devices but neither are operational- she has not been checking her blood sugars;       Referral made to social work team for assistance with support and education;  Review of patient status, including review of consultants reports, relevant laboratory and other test results, and medications completed;       Advised patient to discuss concerns about her DM and effective management of DM with provider;      Screening for signs and symptoms of depression related to chronic disease state;        Assessed social determinant of health barriers;         Hyperlipidemia:  (Status: Goal on Track (progressing): YES.) Long Term Goal  No results found for: CHOL, HDL, LDLCALC, LDLDIRECT, TRIG, CHOLHDL   Medication review performed; medication list updated in electronic medical record. 03-05-2021: The patient is taking pravachol 20 mg daily for cholesterol management  Provider established cholesterol goals reviewed; Counseled on importance of regular laboratory monitoring as prescribed. 03-05-2021: Patient has not have cholesterol labs in a while. Secure message sent to the front office staff at [facility] for appointment  scheduling and lab work; Provided HLD educational materials; Reviewed role and benefits of statin for ASCVD risk reduction; Discussed strategies to manage statin-induced myalgias. 03-05-2021: The patient denies any acute findings or issues related to statin use; Reviewed importance of limiting foods high in cholesterol. 03-05-2021: The patient eats pretty much what she wants to eat. Discussed dietary restrictions with the patient. She states she eats a lot of croissants. Reviewed foods high in sugars, sweets, and fats. Will sent information by the EMMI system and my chart;  Hypertension: (Status: Goal on track: NO.) Long Term Goal  Last practice recorded BP readings:  BP Readings from Last 3 Encounters:  02/26/21 (!) 196/80  02/16/21 (!) 172/78  11/14/20 (!) 183/65  Most recent eGFR/CrCl: No results found for: EGFR  No components found for: CRCL  Evaluation of current treatment plan related to hypertension self management and patient's adherence to plan as established by provider. 03-05-2021: The patient states that her blood pressures were high at the provider office but at home it has been 170/40 and 170/60 and she feels this is good. Extensive education given on the risk of heart attack and stroke with elevated blood pressures. She denies any sx or sx when her blood pressures are elevated. Expressed concern due to elevations and review of medications. The patient endoreses taking medications as ordered. CCM team involvement will hopefully provide additional support and help with management of HTN.    Provided education to patient re: stroke prevention, s/s of heart attack and stroke; Reviewed prescribed diet heart healthy/ADA diet. 03-05-2021: The patient admits that she pretty much eats what she wants to eat. Education given. Will send information for healthy eating by mail, by email, by my chart system, and EMMI system. Education on the importance of monitoring for contents of sodium, fats, and sugars  in diet Reviewed medications with patient and discussed importance of compliance. 03-05-2021: The patient is compliant with medications. Pharm D will reach out to the patient on 03-06-2021 for medications reconciliation and support in medications management;  Discussed plans with patient for ongoing care management follow up and provided patient with direct contact information for care management team; Advised patient, providing education and rationale, to monitor blood pressure daily and record, calling PCP for findings outside established parameters. 03-05-2021: The patient educated on the goals of systolic pressure of <128-786 and diastolic of <76. The patient states when she checks her blood pressure at home it has been 170/40 and 170/60. At office visits it has been 196/80 and 172/78.  The patient states she has  seen it >200/96 at times. Education on the risk of heart attack and stroke and the concerns of elevations in blood pressures. The patient states she has been stressed out because her "baby daughter" had a heart attack recently and she was going to travel to a different states to help her. Encouraged the patient to not drive for long distances and to keep in touch by phone. She says right now she is not making the trip to her daughters because she is doing well. Empathetic listening and support given. Will continue to monitor;  Reviewed scheduled/upcoming provider appointments including: [place] office to call and get a follow up appointment with the pcp for follow up and lab testing Advised patient to discuss blood pressure elevations and HTN concerns with provider; Provided education on prescribed diet heart healthy/ADA diet. 03-05-2021: Review and education given;  Discussed complications of poorly controlled blood pressure such as heart disease, stroke, circulatory complications, vision complications, kidney impairment, sexual dysfunction;   Oncology- History of left breast cancer:  (Goal on Track  (progressing): YES.) Long Term Goal  Assessment of understanding of oncology diagnosis: 03-05-2021: The patient states that she had to cancel her mammogram to evaluate left breast at the site of lumpectomy due to her breast being very sore and she did not want them mashing on it. The patient states that she will reschedule it. She did see oncologist on 02-26-2021 and they want the mammogram and will follow up accordingly. The patient is currently not taking treatments and did have a lumpectomy but per the oncologist there is a chance for reoccurrence. Eduction and support given. Will continue to monitor.  Assessed patient understanding of cancer diagnosis and recommended treatment plan Reviewed upcoming provider appointments and treatment appointments Assessed available transportation to appointments and treatments. Has consistent/reliable transportation: Yes Assessed support system. Has consistent/reliable family or other support: Yes Referred to licensed clinical social work for ongoing support and education  Patient Goals/Self-Care Activities: Take medications as prescribed - 03-05-2021: The patient endorses compliance with medications Attend all scheduled provider appointments, 03-05-2021: Review of upcoming appointments Call pharmacy for medication refills 3-7 days in advance of running out of medications Attend church or other social activities Perform all self care activities independently  Perform IADL's (shopping, preparing meals, housekeeping, managing finances) independently Call provider office for new concerns or questions  Work with the social worker to address care coordination needs and will continue to work with the clinical team to address health care and disease management related needs call the Suicide and Crisis Lifeline: 988 call the [place] National Suicide Prevention Lifeline: (250) 862-7958 or TTY: 512-207-0156 TTY (571) 326-5078) to talk to a trained counselor call 1-800-273-TALK  (toll free, 24 hour hotline) if experiencing a Mental Health or [place]  schedule appointment with eye doctor check blood sugar at prescribed times: twice daily, when you have symptoms of low or high blood sugar, and before and after exercise- 03-05-2021: The patient currently not checking blood sugars. States she has 2 machines but both are non-functional. The patient states that she would like to get another. Will collaborate with the pharm D on getting glucose meter functional for the patient.  check feet daily for cuts, sores or redness enter blood sugar readings and medication or insulin into daily log take the blood sugar log to all doctor visits trim toenails straight across drink 6 to 8 glasses of water each day eat fish at least once per week fill half of plate with vegetables limit fast  food meals to no more than 1 per week manage portion size prepare main meal at home 3 to 5 days each week read food labels for fat, fiber, carbohydrates and portion size reduce red meat to 2 to 3 times a week keep feet up while sitting wash and dry feet carefully every day wear comfortable, cotton socks wear comfortable, well-fitting shoes check blood pressure 3 times per week choose a place to take my blood pressure (home, clinic or office, retail store) write blood pressure results in a log or diary learn about high blood pressure keep a blood pressure log take blood pressure log to all doctor appointments call doctor for signs and symptoms of high blood pressure develop an action plan for high blood pressure keep all doctor appointments take medications for blood pressure exactly as prescribed report new symptoms to your doctor eat more whole grains, fruits and vegetables, lean meats and healthy fats - call for medicine refill 2 or 3 days before it runs out - take all medications exactly as prescribed - call doctor with any symptoms you believe are related to your  medicine - call doctor when you experience any new symptoms - go to all doctor appointments as scheduled - adhere to prescribed diet: heart healthy/ADA diet       Plan:Telephone follow up appointment with care management team member scheduled for:  04-23-2021 at 28 am  Noreene Larsson RN, MSN, [place] Mobile: [phone]

## 2021-03-05 NOTE — Patient Instructions (Signed)
Visit Information   Thank you for taking time to visit with me today. Please don't hesitate to contact me if I can be of assistance to you before our next scheduled telephone appointment.    Please call the care guide team at [phone] if you need to cancel or reschedule your appointment.   If you are experiencing a Mental Health or [place] or need someone to talk to, please call the Suicide and Crisis Lifeline: 988 call the [place] National Suicide Prevention Lifeline: 848-697-7904 or TTY: 613 180 4802 TTY 434-571-8126) to talk to a trained counselor call 1-800-273-TALK (toll free, 24 hour hotline)   Following is a copy of your full care plan:  Care Plan : RNCM: General Plan of Care (Adult) For Chronic Disease Management and Care Coordination Needs  Updates made by Vanita Ingles, RN since 03/05/2021 12:00 AM     Problem: RNCM: Development of Plan of Care for Chronic Disease Management (HTN, HLD, DM, History of breast cancer)   Priority: High     Long-Range Goal: RNCM: Effective Management of Plan of Care for Chronic Disease Management (HTN, HLD, DM, History of breast cancer)   Start Date: 03/05/2021  Expected End Date: 03/05/2022  Note:   Current Barriers:  Knowledge Deficits related to plan of care for management of HTN, HLD, DMII, and history of breast cancer (left breast with soreness at site of lumpectomy currently)  Care Coordination needs related to Limited social support, Social Isolation, and Memory Deficits  Chronic Disease Management support and education needs related to HTN, HLD, DMII, and history of breast cancer to left breast with soreness at the site of lumpectomy currently Lacks caregiver support.         RNCM Clinical Goal(s):  Patient will verbalize basic understanding of HTN, HLD, DMII, and History of breast cancer  disease process and self health management plan as evidenced by keeping appointments, following the plan of care by her providers,  calling the office for questions and concerns, and working with the CCM team to effectively manage health and well being  take all medications exactly as prescribed and will call provider for medication related questions as evidenced by compliance with medications, working with pharm D for medications/equipment needs and calling for refills before running out of medications    attend all scheduled medical appointments: saw pcp on 02-16-2021, follow up with specialist in place as evidenced by keeping appointments and calling for schedule change needs        demonstrate improved and ongoing adherence to prescribed treatment plan for HTN, HLD, DMII, and history of breast cancer as evidenced by blood pressure trending downward, lab work stable, blood sugars WNL with the ability to monitor blood sugars consistently, no reoccurrence of breast cancer- currently the patient is experiencing some soreness to left breast at the site of the lumpectomy continue to work with Consulting civil engineer and/or Social Worker to address care management and care coordination needs related to HTN, HLD, DMII, and history of breast cancer as evidenced by adherence to CM Team Scheduled appointments     work with pharmacist to address Medication procurement and the need for a functional blood glucose testing device related to HTN, HLD, DMII, and history of breast cancer as evidenced by review of EMR and patient or pharmacist report    demonstrate ongoing self health care management ability for effective management of chronic conditions as evidenced by working with the CCM team  through collaboration with Consulting civil engineer, provider,  and care team.   Interventions: 1:1 collaboration with primary care provider regarding development and update of comprehensive plan of care as evidenced by provider attestation and co-signature Inter-disciplinary care team collaboration (see longitudinal plan of care) Evaluation of current treatment plan related  to  self management and patient's adherence to plan as established by provider   Diabetes:  (Status: Goal on Track (progressing): YES.) Long Term Goal   Lab Results  Component Value Date   HGBA1C 5.8 (A) 09/24/2019    Assessed patient's understanding of A1c goal: <7% Provided education to patient about basic DM disease process; Reviewed medications with patient and discussed importance of medication adherence. 03-05-2021: The patient states she is taking medications as prescribed. She is having some affordability concerns. Scheduled to talk to the pharm D tomorrow. Will collaborate with the pharm D concerning expressed needs.         Reviewed prescribed diet with patient heart healthy/ADA diet. 03-05-2021: The patient states that she pretty much eats what she wants to eat. Review of heart healthy/ADA diet and monitoring for salt, fats, and sugars in her diet. Will send educational material through my chart and the EMMI system; Counseled on importance of regular laboratory monitoring as prescribed. 03-05-2021: need an up to date A1C, will see about getting the patient a follow up with the pcp.         Discussed plans with patient for ongoing care management follow up and provided patient with direct contact information for care management team;      Provided patient with written educational materials related to hypo and hyperglycemia and importance of correct treatment. 03-05-2021: Has not been checking her blood sugars as she does not have a glucose meter that is functional. Discussed options with the patient and she will discuss this with the pharm D on 03-06-2021.  She denies any shaking, confusion, light headedness, dizziness, or sx and sx of hyperglycemia;       Reviewed scheduled/upcoming provider appointments including: saw pcp on 02-16-2021, encouraged the patient to follow up with the pcp on a regular basis;         Advised patient, providing education and rationale, to check cbg twice daily, when you  have symptoms of low or high blood sugar, and before and after exercise and record. 03-05-2021: The patient is open to checking blood sugars and is interested in a new device. Has 2 non-working devices.       call provider for findings outside established parameters;       Referral made to pharmacy team for assistance with medication reconciliation and help with a glucose meter that her insurance will pay for due to cost constraints and concerns- she currently has 2 devices but neither are operational- she has not been checking her blood sugars;       Referral made to social work team for assistance with support and education;      Review of patient status, including review of consultants reports, relevant laboratory and other test results, and medications completed;       Advised patient to discuss concerns about her DM and effective management of DM with provider;      Screening for signs and symptoms of depression related to chronic disease state;        Assessed social determinant of health barriers;         Hyperlipidemia:  (Status: Goal on Track (progressing): YES.) Long Term Goal  No results found for: CHOL, HDL, LDLCALC, LDLDIRECT, TRIG,  CHOLHDL   Medication review performed; medication list updated in electronic medical record. 03-05-2021: The patient is taking pravachol 20 mg daily for cholesterol management  Provider established cholesterol goals reviewed; Counseled on importance of regular laboratory monitoring as prescribed. 03-05-2021: Patient has not have cholesterol labs in a while. Secure message sent to the front office staff at [facility] for appointment scheduling and lab work; Provided HLD educational materials; Reviewed role and benefits of statin for ASCVD risk reduction; Discussed strategies to manage statin-induced myalgias. 03-05-2021: The patient denies any acute findings or issues related to statin use; Reviewed importance of limiting foods high in cholesterol. 03-05-2021: The patient  eats pretty much what she wants to eat. Discussed dietary restrictions with the patient. She states she eats a lot of croissants. Reviewed foods high in sugars, sweets, and fats. Will sent information by the EMMI system and my chart;  Hypertension: (Status: Goal on track: NO.) Long Term Goal  Last practice recorded BP readings:  BP Readings from Last 3 Encounters:  02/26/21 (!) 196/80  02/16/21 (!) 172/78  11/14/20 (!) 183/65  Most recent eGFR/CrCl: No results found for: EGFR  No components found for: CRCL  Evaluation of current treatment plan related to hypertension self management and patient's adherence to plan as established by provider. 03-05-2021: The patient states that her blood pressures were high at the provider office but at home it has been 170/40 and 170/60 and she feels this is good. Extensive education given on the risk of heart attack and stroke with elevated blood pressures. She denies any sx or sx when her blood pressures are elevated. Expressed concern due to elevations and review of medications. The patient endoreses taking medications as ordered. CCM team involvement will hopefully provide additional support and help with management of HTN.    Provided education to patient re: stroke prevention, s/s of heart attack and stroke; Reviewed prescribed diet heart healthy/ADA diet. 03-05-2021: The patient admits that she pretty much eats what she wants to eat. Education given. Will send information for healthy eating by mail, by email, by my chart system, and EMMI system. Education on the importance of monitoring for contents of sodium, fats, and sugars in diet Reviewed medications with patient and discussed importance of compliance. 03-05-2021: The patient is compliant with medications. Pharm D will reach out to the patient on 03-06-2021 for medications reconciliation and support in medications management;  Discussed plans with patient for ongoing care management follow up and provided patient  with direct contact information for care management team; Advised patient, providing education and rationale, to monitor blood pressure daily and record, calling PCP for findings outside established parameters. 03-05-2021: The patient educated on the goals of systolic pressure of <295-284 and diastolic of <13. The patient states when she checks her blood pressure at home it has been 170/40 and 170/60. At office visits it has been 196/80 and 172/78.  The patient states she has seen it >200/96 at times. Education on the risk of heart attack and stroke and the concerns of elevations in blood pressures. The patient states she has been stressed out because her "baby daughter" had a heart attack recently and she was going to travel to a different states to help her. Encouraged the patient to not drive for long distances and to keep in touch by phone. She says right now she is not making the trip to her daughters because she is doing well. Empathetic listening and support given. Will continue to monitor;  Reviewed scheduled/upcoming  provider appointments including: [place] office to call and get a follow up appointment with the pcp for follow up and lab testing Advised patient to discuss blood pressure elevations and HTN concerns with provider; Provided education on prescribed diet heart healthy/ADA diet. 03-05-2021: Review and education given;  Discussed complications of poorly controlled blood pressure such as heart disease, stroke, circulatory complications, vision complications, kidney impairment, sexual dysfunction;   Oncology- History of left breast cancer:  (Goal on Track (progressing): YES.) Long Term Goal  Assessment of understanding of oncology diagnosis: 03-05-2021: The patient states that she had to cancel her mammogram to evaluate left breast at the site of lumpectomy due to her breast being very sore and she did not want them mashing on it. The patient states that she will reschedule it. She did see  oncologist on 02-26-2021 and they want the mammogram and will follow up accordingly. The patient is currently not taking treatments and did have a lumpectomy but per the oncologist there is a chance for reoccurrence. Eduction and support given. Will continue to monitor.  Assessed patient understanding of cancer diagnosis and recommended treatment plan Reviewed upcoming provider appointments and treatment appointments Assessed available transportation to appointments and treatments. Has consistent/reliable transportation: Yes Assessed support system. Has consistent/reliable family or other support: Yes Referred to licensed clinical social work for ongoing support and education  Patient Goals/Self-Care Activities: Take medications as prescribed - 03-05-2021: The patient endorses compliance with medications Attend all scheduled provider appointments, 03-05-2021: Review of upcoming appointments Call pharmacy for medication refills 3-7 days in advance of running out of medications Attend church or other social activities Perform all self care activities independently  Perform IADL's (shopping, preparing meals, housekeeping, managing finances) independently Call provider office for new concerns or questions  Work with the social worker to address care coordination needs and will continue to work with the clinical team to address health care and disease management related needs call the Suicide and Crisis Lifeline: 988 call the [place] National Suicide Prevention Lifeline: 281 578 2086 or TTY: 562-501-2155 TTY 340-199-9851) to talk to a trained counselor call 1-800-273-TALK (toll free, 24 hour hotline) if experiencing a Mental Health or [place]  schedule appointment with eye doctor check blood sugar at prescribed times: twice daily, when you have symptoms of low or high blood sugar, and before and after exercise- 03-05-2021: The patient currently not checking blood sugars. States she has 2  machines but both are non-functional. The patient states that she would like to get another. Will collaborate with the pharm D on getting glucose meter functional for the patient.  check feet daily for cuts, sores or redness enter blood sugar readings and medication or insulin into daily log take the blood sugar log to all doctor visits trim toenails straight across drink 6 to 8 glasses of water each day eat fish at least once per week fill half of plate with vegetables limit fast food meals to no more than 1 per week manage portion size prepare main meal at home 3 to 5 days each week read food labels for fat, fiber, carbohydrates and portion size reduce red meat to 2 to 3 times a week keep feet up while sitting wash and dry feet carefully every day wear comfortable, cotton socks wear comfortable, well-fitting shoes check blood pressure 3 times per week choose a place to take my blood pressure (home, clinic or office, retail store) write blood pressure results in a log or diary learn about high blood pressure  keep a blood pressure log take blood pressure log to all doctor appointments call doctor for signs and symptoms of high blood pressure develop an action plan for high blood pressure keep all doctor appointments take medications for blood pressure exactly as prescribed report new symptoms to your doctor eat more whole grains, fruits and vegetables, lean meats and healthy fats - call for medicine refill 2 or 3 days before it runs out - take all medications exactly as prescribed - call doctor with any symptoms you believe are related to your medicine - call doctor when you experience any new symptoms - go to all doctor appointments as scheduled - adhere to prescribed diet: heart healthy/ADA diet       Consent to CCM Services: Ms. Mcintire was given information about Chronic Care Management services including:  CCM service includes personalized support from designated  clinical staff supervised by her physician, including individualized plan of care and coordination with other care providers 24/7 contact phone numbers for assistance for urgent and routine care needs. Service will only be billed when office clinical staff spend 20 minutes or more in a month to coordinate care. Only one practitioner may furnish and bill the service in a calendar month. The patient may stop CCM services at any time (effective at the end of the month) by phone call to the office staff. The patient will be responsible for cost sharing (co-pay) of up to 20% of the service fee (after annual deductible is met).  Patient agreed to services and verbal consent obtained.   Patient verbalizes understanding of instructions and care plan provided today and agrees to view in [place]. Active MyChart status confirmed with patient.    Telephone follow up appointment with care management team member scheduled for: 04-23-2021 at 68 am  Noreene Larsson RN, MSN, [facility] Mobile: [phone]   Diabetes Mellitus and Nutrition, Adult When you have diabetes, or diabetes mellitus, it is very important to have healthy eating habits because your blood sugar (glucose) levels are greatly affected by what you eat and drink. Eating healthy foods in the right amounts, at about the same times every day, can help you: Manage your blood glucose. Lower your risk of heart disease. Improve your blood pressure. Reach or maintain a healthy weight. What can affect my meal plan? Every person with diabetes is different, and each person has different needs for a meal plan. Your health care provider may recommend that you work with a dietitian to make a meal plan that is best for you. Your meal plan may vary depending on factors such as: The calories you need. The medicines you take. Your weight. Your blood glucose, blood pressure,  and cholesterol levels. Your activity level. Other health conditions you have, such as heart or kidney disease. How do carbohydrates affect me? Carbohydrates, also called carbs, affect your blood glucose level more than any other type of food. Eating carbs raises the amount of glucose in your blood. It is important to know how many carbs you can safely have in each meal. This is different for every person. Your dietitian can help you calculate how many carbs you should have at each meal and for each snack. How does alcohol affect me? Alcohol can cause a decrease in blood glucose (hypoglycemia), especially if you use insulin or take certain diabetes medicines by mouth. Hypoglycemia can be a life-threatening condition. Symptoms of hypoglycemia, such as sleepiness,  dizziness, and confusion, are similar to symptoms of having too much alcohol. Do not drink alcohol if: Your health care provider tells you not to drink. You are pregnant, may be pregnant, or are planning to become pregnant. If you drink alcohol: Limit how much you have to: 0-1 drink a day for women. 0-2 drinks a day for men. Know how much alcohol is in your drink. In the [place] one drink equals one 12 oz bottle of beer (355 mL), one 5 oz glass of wine (148 mL), or one 1 oz glass of hard liquor (44 mL). Keep yourself hydrated with water, diet soda, or unsweetened iced tea. Keep in mind that regular soda, juice, and other mixers may contain a lot of sugar and must be counted as carbs. What are tips for following this plan? Reading food labels Start by checking the serving size on the Nutrition Facts label of packaged foods and drinks. The number of calories and the amount of carbs, fats, and other nutrients listed on the label are based on one serving of the item. Many items contain more than one serving per package. Check the total grams (g) of carbs in one serving. Check the number of grams of saturated fats and trans fats in one  serving. Choose foods that have a low amount or none of these fats. Check the number of milligrams (mg) of salt (sodium) in one serving. Most people should limit total sodium intake to less than 2,300 mg per day. Always check the nutrition information of foods labeled as "low-fat" or "nonfat." These foods may be higher in added sugar or refined carbs and should be avoided. Talk to your dietitian to identify your daily goals for nutrients listed on the label. Shopping Avoid buying canned, pre-made, or processed foods. These foods tend to be high in fat, sodium, and added sugar. Shop around the outside edge of the grocery store. This is where you will most often find fresh fruits and vegetables, bulk grains, fresh meats, and fresh dairy products. Cooking Use low-heat cooking methods, such as baking, instead of high-heat cooking methods, such as deep frying. Cook using healthy oils, such as olive, canola, or sunflower oil. Avoid cooking with butter, cream, or high-fat meats. Meal planning Eat meals and snacks regularly, preferably at the same times every day. Avoid going long periods of time without eating. Eat foods that are high in fiber, such as fresh fruits, vegetables, beans, and whole grains. Eat 4-6 oz (112-168 g) of lean protein each day, such as lean meat, chicken, fish, eggs, or tofu. One ounce (oz) (28 g) of lean protein is equal to: 1 oz (28 g) of meat, chicken, or fish. 1 egg.  cup (62 g) of tofu. Eat some foods each day that contain healthy fats, such as avocado, nuts, seeds, and fish. What foods should I eat? Fruits Berries. Apples. Oranges. Peaches. Apricots. Plums. Grapes. Mangoes. Papayas. Pomegranates. Kiwi. Cherries. Vegetables Leafy greens, including lettuce, spinach, kale, chard, collard greens, mustard greens, and cabbage. Beets. Cauliflower. Broccoli. Carrots. Green beans. Tomatoes. Peppers. Onions. Cucumbers. Brussels sprouts. Grains Whole grains, such as whole-wheat  or whole-grain bread, crackers, tortillas, cereal, and pasta. Unsweetened oatmeal. Quinoa. Brown or wild rice. Meats and other proteins Seafood. Poultry without skin. Lean cuts of poultry and beef. Tofu. Nuts. Seeds. Dairy Low-fat or fat-free dairy products such as milk, yogurt, and cheese. The items listed above may not be a complete list of foods and beverages you can eat and drink. Contact a  dietitian for more information. What foods should I avoid? Fruits Fruits canned with syrup. Vegetables Canned vegetables. Frozen vegetables with butter or cream sauce. Grains Refined white flour and flour products such as bread, pasta, snack foods, and cereals. Avoid all processed foods. Meats and other proteins Fatty cuts of meat. Poultry with skin. Breaded or fried meats. Processed meat. Avoid saturated fats. Dairy Full-fat yogurt, cheese, or milk. Beverages Sweetened drinks, such as soda or iced tea. The items listed above may not be a complete list of foods and beverages you should avoid. Contact a dietitian for more information. Questions to ask a health care provider Do I need to meet with a certified diabetes care and education specialist? Do I need to meet with a dietitian? What number can I call if I have questions? When are the best times to check my blood glucose? Where to find more information: American Diabetes Association: diabetes.org Academy of Nutrition and Dietetics: eatright.[employer] of Diabetes and Digestive and Kidney Diseases: [employer].is Association of Diabetes Care & Education Specialists: diabeteseducator.org Summary It is important to have healthy eating habits because your blood sugar (glucose) levels are greatly affected by what you eat and drink. It is important to use alcohol carefully. A healthy meal plan will help you manage your blood glucose and lower your risk of heart disease. Your health care provider may recommend that you work with a  dietitian to make a meal plan that is best for you. This information is not intended to replace advice given to you by your health care provider. Make sure you discuss any questions you have with your health care provider. Document Revised: 08/22/2019 Document Reviewed: 08/22/2019 Elsevier Patient Education  [place]. Cholesterol Content in Foods Cholesterol is a waxy, fat-like substance that helps to carry fat in the blood. The body needs cholesterol in small amounts, but too much cholesterol can cause damage to the arteries and heart. What foods have cholesterol? Cholesterol is found in animal-based foods, such as meat, seafood, and dairy. Generally, low-fat dairy and lean meats have less cholesterol than full-fat dairy and fatty meats. The milligrams of cholesterol per serving (mg per serving) of common cholesterol-containing foods are listed below. Meats and other proteins Egg -- one large whole egg has 186 mg. Veal shank -- 4 oz (113 g) has 141 mg. Lean ground [place] (93% lean) -- 4 oz (113 g) has 118 mg. Fat-trimmed lamb loin -- 4 oz (113 g) has 106 mg. Lean ground beef (90% lean) -- 4 oz (113 g) has 100 mg. Lobster -- 3.5 oz (99 g) has 90 mg. Pork loin chops -- 4 oz (113 g) has 86 mg. Canned salmon -- 3.5 oz (99 g) has 83 mg. Fat-trimmed beef top loin -- 4 oz (113 g) has 78 mg. Frankfurter -- 1 frank (3.5 oz or 99 g) has 77 mg. Crab -- 3.5 oz (99 g) has 71 mg. Roasted chicken without skin, white meat -- 4 oz (113 g) has 66 mg. Light bologna -- 2 oz (57 g) has 45 mg. Deli-cut [place] -- 2 oz (57 g) has 31 mg. Canned tuna -- 3.5 oz (99 g) has 31 mg. Berniece Salines -- 1 oz (28 g) has 29 mg. Oysters and mussels (raw) -- 3.5 oz (99 g) has 25 mg. Mackerel -- 1 oz (28 g) has 22 mg. Trout -- 1 oz (28 g) has 20 mg. Pork sausage -- 1 link (1 oz or 28 g) has 17 mg. Salmon --  1 oz (28 g) has 16 mg. Tilapia -- 1 oz (28 g) has 14 mg. Dairy Soft-serve ice cream --  cup (4 oz or 86 g) has 103  mg. Whole-milk yogurt -- 1 cup (8 oz or 245 g) has 29 mg. Cheddar cheese -- 1 oz (28 g) has 28 mg. American cheese -- 1 oz (28 g) has 28 mg. Whole milk -- 1 cup (8 oz or 250 mL) has 23 mg. 2% milk -- 1 cup (8 oz or 250 mL) has 18 mg. Cream cheese -- 1 tablespoon (Tbsp) (14.5 g) has 15 mg. Cottage cheese --  cup (4 oz or 113 g) has 14 mg. Low-fat (1%) milk -- 1 cup (8 oz or 250 mL) has 10 mg. Sour cream -- 1 Tbsp (12 g) has 8.5 mg. Low-fat yogurt -- 1 cup (8 oz or 245 g) has 8 mg. Nonfat Greek yogurt -- 1 cup (8 oz or 228 g) has 7 mg. Half-and-half cream -- 1 Tbsp (15 mL) has 5 mg. Fats and oils Cod liver oil -- 1 tablespoon (Tbsp) (13.6 g) has 82 mg. Butter -- 1 Tbsp (14 g) has 15 mg. Lard -- 1 Tbsp (12.8 g) has 14 mg. Bacon grease -- 1 Tbsp (12.9 g) has 14 mg. Mayonnaise -- 1 Tbsp (13.8 g) has 5-10 mg. Margarine -- 1 Tbsp (14 g) has 3-10 mg. The items listed above may not be a complete list of foods with cholesterol. Exact amounts of cholesterol in these foods may vary depending on specific ingredients and brands. Contact a dietitian for more information. What foods do not have cholesterol? Most plant-based foods do not have cholesterol unless you combine them with a food that has cholesterol. Foods without cholesterol include: Grains and cereals. Vegetables. Fruits. Vegetable oils, such as olive, canola, and sunflower oil. Legumes, such as peas, beans, and lentils. Nuts and seeds. Egg whites. The items listed above may not be a complete list of foods that do not have cholesterol. Contact a dietitian for more information. Summary The body needs cholesterol in small amounts, but too much cholesterol can cause damage to the arteries and heart. Cholesterol is found in animal-based foods, such as meat, seafood, and dairy. Generally, low-fat dairy and lean meats have less cholesterol than full-fat dairy and fatty meats. This information is not intended to replace advice given to you by  your health care provider. Make sure you discuss any questions you have with your health care provider. Document Revised: 05/30/2020 Document Reviewed: 05/30/2020 Elsevier Patient Education  2022 [place] Eating Plan DASH stands for Dietary Approaches to Stop Hypertension. The DASH eating plan is a healthy eating plan that has been shown to: Reduce high blood pressure (hypertension). Reduce your risk for type 2 diabetes, heart disease, and stroke. Help with weight loss. What are tips for following this plan? Reading food labels Check food labels for the amount of salt (sodium) per serving. Choose foods with less than 5 percent of the Daily Value of sodium. Generally, foods with less than 300 milligrams (mg) of sodium per serving fit into this eating plan. To find whole grains, look for the word "whole" as the first word in the ingredient list. Shopping Buy products labeled as "low-sodium" or "no salt added." Buy fresh foods. Avoid canned foods and pre-made or frozen meals. Cooking Avoid adding salt when cooking. Use salt-free seasonings or herbs instead of table salt or sea salt. Check with your health care provider or pharmacist before using  salt substitutes. Do not fry foods. Cook foods using healthy methods such as baking, boiling, grilling, roasting, and broiling instead. Cook with heart-healthy oils, such as olive, canola, avocado, soybean, or sunflower oil. Meal planning  Eat a balanced diet that includes: 4 or more servings of fruits and 4 or more servings of vegetables each day. Try to fill one-half of your plate with fruits and vegetables. 6-8 servings of whole grains each day. Less than 6 oz (170 g) of lean meat, poultry, or fish each day. A 3-oz (85-g) serving of meat is about the same size as a deck of cards. One egg equals 1 oz (28 g). 2-3 servings of low-fat dairy each day. One serving is 1 cup (237 mL). 1 serving of nuts, seeds, or beans 5 times each week. 2-3  servings of heart-healthy fats. Healthy fats called omega-3 fatty acids are found in foods such as walnuts, flaxseeds, fortified milks, and eggs. These fats are also found in cold-water fish, such as sardines, salmon, and mackerel. Limit how much you eat of: Canned or prepackaged foods. Food that is high in trans fat, such as some fried foods. Food that is high in saturated fat, such as fatty meat. Desserts and other sweets, sugary drinks, and other foods with added sugar. Full-fat dairy products. Do not salt foods before eating. Do not eat more than 4 egg yolks a week. Try to eat at least 2 vegetarian meals a week. Eat more home-cooked food and less restaurant, buffet, and fast food. Lifestyle When eating at a restaurant, ask that your food be prepared with less salt or no salt, if possible. If you drink alcohol: Limit how much you use to: 0-1 drink a day for women who are not pregnant. 0-2 drinks a day for men. Be aware of how much alcohol is in your drink. In the [place] one drink equals one 12 oz bottle of beer (355 mL), one 5 oz glass of wine (148 mL), or one 1 oz glass of hard liquor (44 mL). General information Avoid eating more than 2,300 mg of salt a day. If you have hypertension, you may need to reduce your sodium intake to 1,500 mg a day. Work with your health care provider to maintain a healthy body weight or to lose weight. Ask what an ideal weight is for you. Get at least 30 minutes of exercise that causes your heart to beat faster (aerobic exercise) most days of the week. Activities may include walking, swimming, or biking. Work with your health care provider or dietitian to adjust your eating plan to your individual calorie needs. What foods should I eat? Fruits All fresh, dried, or frozen fruit. Canned fruit in natural juice (without added sugar). Vegetables Fresh or frozen vegetables (raw, steamed, roasted, or grilled). Low-sodium or reduced-sodium tomato and vegetable  juice. Low-sodium or reduced-sodium tomato sauce and tomato paste. Low-sodium or reduced-sodium canned vegetables. Grains Whole-grain or whole-wheat bread. Whole-grain or whole-wheat pasta. Brown rice. [place]. Bulgur. Whole-grain and low-sodium cereals. Pita bread. Low-fat, low-sodium crackers. Whole-wheat flour tortillas. Meats and other proteins Skinless chicken or [place]. Ground chicken or [place]. Pork with fat trimmed off. Fish and seafood. Egg whites. Dried beans, peas, or lentils. Unsalted nuts, nut butters, and seeds. Unsalted canned beans. Lean cuts of beef with fat trimmed off. Low-sodium, lean precooked or cured meat, such as sausages or meat loaves. Dairy Low-fat (1%) or fat-free (skim) milk. Reduced-fat, low-fat, or fat-free cheeses. Nonfat, low-sodium ricotta or cottage cheese. Low-fat or  nonfat yogurt. Low-fat, low-sodium cheese. Fats and oils Soft margarine without trans fats. Vegetable oil. Reduced-fat, low-fat, or light mayonnaise and salad dressings (reduced-sodium). Canola, safflower, olive, avocado, soybean, and sunflower oils. Avocado. Seasonings and condiments Herbs. Spices. Seasoning mixes without salt. Other foods Unsalted popcorn and pretzels. Fat-free sweets. The items listed above may not be a complete list of foods and beverages you can eat. Contact a dietitian for more information. What foods should I avoid? Fruits Canned fruit in a light or heavy syrup. Fried fruit. Fruit in cream or butter sauce. Vegetables Creamed or fried vegetables. Vegetables in a cheese sauce. Regular canned vegetables (not low-sodium or reduced-sodium). Regular canned tomato sauce and paste (not low-sodium or reduced-sodium). Regular tomato and vegetable juice (not low-sodium or reduced-sodium). Angie Fava. Olives. Grains Baked goods made with fat, such as croissants, muffins, or some breads. Dry pasta or rice meal packs. Meats and other proteins Fatty cuts of meat. Ribs. Fried meat.  Berniece Salines. Bologna, salami, and other precooked or cured meats, such as sausages or meat loaves. Fat from the back of a pig (fatback). Bratwurst. Salted nuts and seeds. Canned beans with added salt. Canned or smoked fish. Whole eggs or egg yolks. Chicken or [place] with skin. Dairy Whole or 2% milk, cream, and half-and-half. Whole or full-fat cream cheese. Whole-fat or sweetened yogurt. Full-fat cheese. Nondairy creamers. Whipped toppings. Processed cheese and cheese spreads. Fats and oils Butter. Stick margarine. Lard. Shortening. Ghee. Bacon fat. Tropical oils, such as coconut, palm kernel, or palm oil. Seasonings and condiments Onion salt, garlic salt, seasoned salt, table salt, and sea salt. Worcestershire sauce. Tartar sauce. Barbecue sauce. Teriyaki sauce. Soy sauce, including reduced-sodium. Steak sauce. Canned and packaged gravies. Fish sauce. Oyster sauce. Cocktail sauce. Store-bought horseradish. Ketchup. Mustard. Meat flavorings and tenderizers. Bouillon cubes. Hot sauces. Pre-made or packaged marinades. Pre-made or packaged taco seasonings. Relishes. Regular salad dressings. Other foods Salted popcorn and pretzels. The items listed above may not be a complete list of foods and beverages you should avoid. Contact a dietitian for more information. Where to find more information National Heart, Lung, and Blood Institute: https://wilson-eaton.com/ American Heart Association: www.heart.org Academy of Nutrition and Dietetics: www.eatright.[place]: www.kidney.org Summary The DASH eating plan is a healthy eating plan that has been shown to reduce high blood pressure (hypertension). It may also reduce your risk for type 2 diabetes, heart disease, and stroke. When on the DASH eating plan, aim to eat more fresh fruits and vegetables, whole grains, lean proteins, low-fat dairy, and heart-healthy fats. With the DASH eating plan, you should limit salt (sodium) intake to 2,300 mg a day. If  you have hypertension, you may need to reduce your sodium intake to 1,500 mg a day. Work with your health care provider or dietitian to adjust your eating plan to your individual calorie needs. This information is not intended to replace advice given to you by your health care provider. Make sure you discuss any questions you have with your health care provider. Document Revised: 12/22/2018 Document Reviewed: 12/22/2018 Elsevier Patient Education  2022 [employer].

## 2021-03-06 ENCOUNTER — Other Ambulatory Visit: Payer: Self-pay | Admitting: Family Medicine

## 2021-03-06 ENCOUNTER — Ambulatory Visit: Payer: Medicare Other | Admitting: Pharmacist

## 2021-03-06 DIAGNOSIS — I10 Essential (primary) hypertension: Secondary | ICD-10-CM

## 2021-03-06 DIAGNOSIS — E1121 Type 2 diabetes mellitus with diabetic nephropathy: Secondary | ICD-10-CM

## 2021-03-06 MED ORDER — ACCU-CHEK GUIDE VI STRP: ORAL_STRIP | 3 refills | Status: AC

## 2021-03-06 MED ORDER — CLONIDINE HCL 0.1 MG PO TABS: ORAL_TABLET | ORAL | 2 refills | Status: DC

## 2021-03-06 MED ORDER — ACCU-CHEK GUIDE W/DEVICE KIT: PACK | 0 refills | Status: AC

## 2021-03-06 MED ORDER — ACCU-CHEK SOFTCLIX LANCETS MISC: 3 refills | Status: AC

## 2021-03-06 NOTE — Patient Instructions (Signed)
Visit Information   Thank you for taking time to visit with me today. Please don't hesitate to contact me if I can be of assistance to you before our next scheduled telephone appointment.  Following are the goals we discussed today:   Goals Addressed             This Visit's Progress    Pharmacy Goals       Please check your home blood pressure, keep a log of the results and bring this with you to your medical appointments.  Feel free to call me with any questions or concerns. I look forward to our next call!  Wallace Cullens, PharmD, Para [place], CPP Clinical Pharmacist [facility] [phone]         Our next appointment is by telephone on 03/20/2021 at 9:15 AM  Please call the care guide team at [phone] if you need to cancel or reschedule your appointment.    Following is a copy of your full care plan:  Care Plan : RNCM: General Plan of Care (Adult) For Chronic Disease Management and Care Coordination Needs  Updates made by Rennis Petty, RPH-CPP since 03/06/2021 12:00 AM     Problem: Disease Progression      Care Plan : PharmD - HTN, med mgmt  Updates made by Rennis Petty, RPH-CPP since 03/06/2021 12:00 AM     Problem: Disease Progression      Long-Range Goal: Disease Progression Prevented or Minimized   Start Date: 03/06/2021  Expected End Date: 06/04/2021  This Visit's Progress: On track  Priority: High  Note:   Current Barriers:  Unable to achieve control of blood pressure   Pharmacist Clinical Goal(s):  patient will achieve control of blood pressure as evidenced by home and in office blood pressure readings through collaboration with PharmD and provider.    Interventions: 1:1 collaboration with [name], DO regarding development and update of comprehensive plan of care as evidenced by provider attestation and co-signature Inter-disciplinary care team collaboration (see longitudinal plan of  care) Perform chart review Patient seen for Office Visit with PCP on 02/16/2021 Note patient's blood pressure elevated at time of visit and patient reported has been out of her medications Provider advised patient: Start Cipro 500 BID x 7 days for suspected UTI Return to Oncology for left breast pain Restart blood pressure medications Referral placed to CCM team Office Visit with [facility] on 02/26/2021 for follow up of Stage Ia ER/PR positive, 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 Note CCM RN spoke with patient on 2/2 and reports collaborating with office to have patient scheduled for follow up appointment with PCP. Also, lets CM Pharmacist know patient needing a new glucometer Today patient reports she currently has a cold/cough. Denies fever. Denies need for evaluation of symptoms by PCP at this time Reports is staying hydrated and resting Encourage patient to follow up with PCP if new or worsening symptoms Patient plans to reschedule mammogram for when she is feeling better and daughter can come with her Patient requests new Rx for glucometer and supplies as previous glucometers not working Will collaborate with PCP to send Rx for new glucometer and supplies to patient's pharmacy, based on insurance coverage Offer to send patient how to use video, but she  reports will have daughter find this online and view it with her Reports urinary burning symptom resolved since completed course of ciprofloxacin, but still struggling with urinary incontinence symptoms Will follow up with PCP as needed Comprehensive medication review performed; medication list updated in electronic medical record Reports aware that gabapentin can make her dizzy, so only takes as needed for  headaches. Lays down if she takes it.  Medication Adherence: Reports currently takes medications directly from pill bottles and admits to often misses doses due to forgetting to take them Counsel patient on importance of medication adherence and tools/strategies to aid with adherence such as weekly pillbox, timing of administration and daily alarms Patient reports will start using weekly pillbox and may consider using daily phone alarm (will discuss setting this up with daughter) Reports oldest daughter (lives in [place]) helps with her medications, but currently is away, returning to the area tomorrow  Hypertension: Uncontrolled based on latest office readings; current treatment:  Amlodipine 10 mg daily Lisinopril-HCTZ 20-12.5 mg daily From review of chart, note patient previously had Rx for Clonidine 0.1 mg tablets with direction: take 1 tablet by mouth daily as needed for elevated blood pressure greater than 180/100/headache Reports is out of this medication/current Rx expired Requests to ask PCP whether she should continue to have this to use only as needed (if BP greater than 180/100/headache) Will collaborate with PCP Reports has an upper arm blood pressure monitor, but does not have recent readings Counsel patient on blood pressure monitoring technique Reports restarted taking amlodipine and lisinopril-HCTZ following appointment with PCP on 1/16. Admits recently often missing doses (see above - medication adherence) Encourage patient to monitor home blood pressure, keep log and have record to review during medical appointments  Hyperlipidemia: Lab work needed for assessment of control; RNCM has sent message to regarding further labs Current treatment: Pravastatin 20 mg daily  Patient Goals/Self-Care Activities patient will:  - take medications as prescribed as evidenced by patient report and record review - check blood pressure, document, and provide at future appointments       Consent to CCM Services: Ms. Chriswell was given information about Chronic Care Management services including:  CCM service includes personalized support from designated clinical staff supervised by her physician, including individualized plan of care and coordination with other care providers 24/7 contact phone numbers for assistance for urgent and routine care needs. Service will only be billed when office clinical staff spend 20 minutes or more in a month to coordinate care. Only one practitioner may furnish and bill the service in a calendar month. The patient may stop CCM services at any time (effective at the end of the month) by phone call to the office staff. The patient will be responsible for cost sharing (co-pay) of up to 20% of the service fee (after annual deductible is met).  Patient agreed to services and verbal consent obtained.   Patient verbalizes understanding of instructions and care plan provided today and agrees to view in [place]. Active MyChart status confirmed with patient.

## 2021-03-06 NOTE — Chronic Care Management (AMB) (Signed)
Chronic Care Management CCM Pharmacy Note  03/06/2021 Name:  Ellen [name] MRN:  [id number] DOB:  1929-04-25   Subjective: Ellen [name] is an 88 y.o. year old female who is a primary patient of [name], DO.  The CCM team was consulted for assistance with disease management and care coordination needs.    Engaged with patient by telephone for initial visit for pharmacy case management and/or care coordination services.   Objective:  Medications Reviewed Today     Reviewed by Rennis Petty, RPH-CPP (Pharmacist) on 03/06/21 at [phone]  Med List Status: <None>   Medication Order Taking? Sig Documenting Provider Last Dose Status Informant  acetaminophen (TYLENOL) 500 MG tablet [id number] Yes Take 1,000 mg by mouth every 6 (six) hours as needed for moderate pain or headache.  [provider] Taking Active Self  amLODipine (NORVASC) 10 MG tablet [id number] Yes Take 1 tablet (10 mg total) by mouth daily. [name], DO Taking Active   aspirin 81 MG chewable tablet [id number]  Chew 81 mg by mouth daily.  [provider]  Active Self           Med Note [name], ANA K   Wed Jun 07, 2018 11:10 AM)    Blood Glucose Monitoring Suppl (ONE TOUCH ULTRA 2) w/Device KIT [id number]  Use to check blood sugar daily, up to 2 times if need [name], DO  Active   cloNIDine (CATAPRES) 0.1 MG tablet [id number] No TAKE 1 TABLET BY MOUTH DAILY AS NEEDED FOR ELEVATED BLOOD PRESSURE GREATER THAN 180/100/HEADACHE  Patient not taking: Reported on 03/06/2021   [name], DO Not Taking Active   diclofenac Sodium (VOLTAREN) 1 % GEL [id number] No Apply 2 g topically 4 (four) times daily as needed (arthritis knee, elbow).  Patient not taking: Reported on 03/06/2021   [name], DO Not Taking Active   gabapentin (NEURONTIN) 300 MG capsule [id number] No TAKE 1 CAPSULE(300 MG) BY MOUTH IN THE MORNING AND AT BEDTIME  Patient not taking:  Reported on 03/06/2021   [name], DO Not Taking Active   Lancets [facility] ULTRASOFT) lancets [id number]  Use to check blood sugar daily, up to 2 times if need [name], DO  Active   lisinopril-hydrochlorothiazide (ZESTORETIC) 20-12.5 MG tablet [id number] Yes Take 1 tablet by mouth daily. [name], DO Taking Active   Multiple Vitamins-Minerals (CERTAVITE SENIOR/ANTIOXIDANT) TABS [id number] No Take by mouth.  Patient not taking: Reported on 03/06/2021   [provider] Not Taking Active   ONE TOUCH ULTRA TEST test strip [id number]  Use to check blood sugar daily, up to 2 times if need [name], DO  Active   pravastatin (PRAVACHOL) 20 MG tablet [id number] Yes Take 1 tablet (20 mg total) by mouth at bedtime. [name], DO Taking Active             Pertinent Labs:  Lab Results  Component Value Date   HGBA1C 5.8 (A) 09/24/2019   No results found for: CHOL, HDL, LDLCALC, LDLDIRECT, TRIG, CHOLHDL Lab Results  Component Value Date   CREATININE 1.21 (H) 08/09/2020   BUN 19 08/09/2020   NA 138 08/09/2020   K 4.4 08/09/2020   CL 106 08/09/2020   CO2 26 08/09/2020   BP Readings from Last 3 Encounters:  02/26/21 (!) 196/80  02/16/21 (!) 172/78  11/14/20 (!) 183/65   Pulse Readings from Last 3 Encounters:  02/26/21 60  02/16/21 64  11/14/20 69     SDOH:  (Social Determinants of Health) assessments and interventions performed:    CCM Care Plan  Review of patient past medical history, allergies, medications, health status, including review of consultants reports, laboratory and other test data, was performed as part of comprehensive evaluation and provision of chronic care management services.   Care Plan : RNCM: General Plan of Care (Adult) For Chronic Disease Management and Care Coordination Needs  Updates made by Rennis Petty, RPH-CPP since 03/06/2021 12:00 AM     Problem: Disease Progression       Care Plan : PharmD - HTN, med mgmt  Updates made by Rennis Petty, RPH-CPP since 03/06/2021 12:00 AM     Problem: Disease Progression      Long-Range Goal: Disease Progression Prevented or Minimized   Start Date: 03/06/2021  Expected End Date: 06/04/2021  This Visit's Progress: On track  Priority: High  Note:   Current Barriers:  Unable to achieve control of blood pressure   Pharmacist Clinical Goal(s):  patient will achieve control of blood pressure as evidenced by home and in office blood pressure readings through collaboration with PharmD and provider.    Interventions: 1:1 collaboration with [name], DO regarding development and update of comprehensive plan of care as evidenced by provider attestation and co-signature Inter-disciplinary care team collaboration (see longitudinal plan of care) Perform chart review Patient seen for Office Visit with PCP on 02/16/2021 Note patient's blood pressure elevated at time of visit and patient reported has been out of her medications Provider advised patient: Start Cipro 500 BID x 7 days for suspected UTI Return to Oncology for left breast pain Restart blood pressure medications Referral placed to CCM team Office Visit with [facility] on 02/26/2021 for follow up of Stage Ia ER/PR positive, HER-2 negative invasive carcinoma of the left upper outer quadrant breast Provider advised patient to return for repeat mammogram in 1 to 2 weeks Note patient missed appointment for mammogram on 03/03/2021 Receive message from [place]. Note CCM RN spoke with patient on 2/2 and reports collaborating with office to have patient scheduled for follow up appointment with PCP. Also, lets CM Pharmacist know patient needing a new glucometer Today patient reports she currently has a cold/cough. Denies fever. Denies need for evaluation of symptoms by PCP at this time Reports is staying hydrated and  resting Encourage patient to follow up with PCP if new or worsening symptoms Patient plans to reschedule mammogram for when she is feeling better and daughter can come with her Patient requests new Rx for glucometer and supplies as previous glucometers not working Will collaborate with PCP to send Rx for new glucometer and supplies to patient's pharmacy, based on insurance coverage Offer to send patient how to use video, but she reports will have daughter find this online and view it with her Reports urinary burning symptom resolved since completed course of ciprofloxacin, but still struggling with urinary incontinence symptoms Will follow up with PCP as needed Comprehensive medication review performed; medication list updated in electronic medical record Reports aware that gabapentin can make her dizzy, so only takes as needed for headaches. Lays down if she takes it.  Medication Adherence: Reports currently takes medications directly from pill bottles and admits to often misses doses due to forgetting to take them Counsel patient on importance of medication adherence and tools/strategies to aid with adherence such as weekly pillbox, timing of administration and daily alarms Patient reports  will start using weekly pillbox and may consider using daily phone alarm (will discuss setting this up with daughter) Reports oldest daughter (lives in [place]) helps with her medications, but currently is away, returning to the area tomorrow  Hypertension: Uncontrolled based on latest office readings; current treatment:  Amlodipine 10 mg daily Lisinopril-HCTZ 20-12.5 mg daily From review of chart, note patient previously had Rx for Clonidine 0.1 mg tablets with direction: take 1 tablet by mouth daily as needed for elevated blood pressure greater than 180/100/headache Reports is out of this medication/current Rx expired Requests to ask PCP whether she should continue to have this to use only as needed (if  BP greater than 180/100/headache) Will collaborate with PCP Reports has an upper arm blood pressure monitor, but does not have recent readings Counsel patient on blood pressure monitoring technique Reports restarted taking amlodipine and lisinopril-HCTZ following appointment with PCP on 1/16. Admits recently often missing doses (see above - medication adherence) Encourage patient to monitor home blood pressure, keep log and have record to review during medical appointments  Hyperlipidemia: Lab work needed for assessment of control; RNCM has sent message to regarding further labs Current treatment: Pravastatin 20 mg daily  Patient Goals/Self-Care Activities patient will:  - take medications as prescribed as evidenced by patient report and record review - check blood pressure, document, and provide at future appointments       Plan: Telephone follow up appointment with care management team member scheduled for:  03/20/2021 at [place], PharmD, Para [place], [place] [phone]

## 2021-03-17 ENCOUNTER — Ambulatory Visit: Payer: Medicare Other | Admitting: Licensed Clinical Social Worker

## 2021-03-17 DIAGNOSIS — M25511 Pain in right shoulder: Secondary | ICD-10-CM

## 2021-03-17 DIAGNOSIS — I10 Essential (primary) hypertension: Secondary | ICD-10-CM

## 2021-03-17 DIAGNOSIS — E1121 Type 2 diabetes mellitus with diabetic nephropathy: Secondary | ICD-10-CM

## 2021-03-17 DIAGNOSIS — G8929 Other chronic pain: Secondary | ICD-10-CM

## 2021-03-20 ENCOUNTER — Ambulatory Visit: Payer: Medicare Other | Admitting: Pharmacist

## 2021-03-20 DIAGNOSIS — E1121 Type 2 diabetes mellitus with diabetic nephropathy: Secondary | ICD-10-CM

## 2021-03-20 DIAGNOSIS — I10 Essential (primary) hypertension: Secondary | ICD-10-CM

## 2021-03-20 DIAGNOSIS — E1169 Type 2 diabetes mellitus with other specified complication: Secondary | ICD-10-CM

## 2021-03-20 NOTE — Chronic Care Management (AMB) (Signed)
Chronic Care Management CCM Pharmacy Note  03/20/2021 Name:  Ellen [name] MRN:  [id number] DOB:  1929-08-01   Subjective: Ellen [name] is an 88 y.o. year old female who is a primary patient of [name], DO.  The CCM team was consulted for assistance with disease management and care coordination needs.    Engaged with patient by telephone for follow up visit for pharmacy case management and/or care coordination services.   Objective:  Medications Reviewed Today     Reviewed by Rennis Petty, RPH-CPP (Pharmacist) on 03/06/21 at [phone]  Med List Status: <None>   Medication Order Taking? Sig Documenting Provider Last Dose Status Informant  acetaminophen (TYLENOL) 500 MG tablet [id number] Yes Take 1,000 mg by mouth every 6 (six) hours as needed for moderate pain or headache.  [provider] Taking Active Self  amLODipine (NORVASC) 10 MG tablet [id number] Yes Take 1 tablet (10 mg total) by mouth daily. [name], DO Taking Active   aspirin 81 MG chewable tablet [id number]  Chew 81 mg by mouth daily.  [provider]  Active Self           Med Note [name], ANA K   Wed Jun 07, 2018 11:10 AM)    Blood Glucose Monitoring Suppl (ONE TOUCH ULTRA 2) w/Device KIT [id number]  Use to check blood sugar daily, up to 2 times if need [name], DO  Active   cloNIDine (CATAPRES) 0.1 MG tablet [id number] No TAKE 1 TABLET BY MOUTH DAILY AS NEEDED FOR ELEVATED BLOOD PRESSURE GREATER THAN 180/100/HEADACHE  Patient not taking: Reported on 03/06/2021   [name], DO Not Taking Active   diclofenac Sodium (VOLTAREN) 1 % GEL [id number] No Apply 2 g topically 4 (four) times daily as needed (arthritis knee, elbow).  Patient not taking: Reported on 03/06/2021   [name], DO Not Taking Active   gabapentin (NEURONTIN) 300 MG capsule [id number] No TAKE 1 CAPSULE(300 MG) BY MOUTH IN THE MORNING AND AT BEDTIME  Patient not  taking: Reported on 03/06/2021   [name], DO Not Taking Active   Lancets [facility] ULTRASOFT) lancets [id number]  Use to check blood sugar daily, up to 2 times if need [name], DO  Active   lisinopril-hydrochlorothiazide (ZESTORETIC) 20-12.5 MG tablet [id number] Yes Take 1 tablet by mouth daily. [name], DO Taking Active   Multiple Vitamins-Minerals (CERTAVITE SENIOR/ANTIOXIDANT) TABS [id number] No Take by mouth.  Patient not taking: Reported on 03/06/2021   [provider] Not Taking Active   ONE TOUCH ULTRA TEST test strip [id number]  Use to check blood sugar daily, up to 2 times if need [name], DO  Active   pravastatin (PRAVACHOL) 20 MG tablet [id number] Yes Take 1 tablet (20 mg total) by mouth at bedtime. [name], DO Taking Active             Pertinent Labs:  Lab Results  Component Value Date   HGBA1C 5.8 (A) 09/24/2019   No results found for: CHOL, HDL, LDLCALC, LDLDIRECT, TRIG, CHOLHDL Lab Results  Component Value Date   CREATININE 1.21 (H) 08/09/2020   BUN 19 08/09/2020   NA 138 08/09/2020   K 4.4 08/09/2020   CL 106 08/09/2020   CO2 26 08/09/2020    SDOH:  (Social Determinants of Health) assessments and interventions performed:    [place]  Review of patient past medical history, allergies, medications, health status, including review of  consultants reports, laboratory and other test data, was performed as part of comprehensive evaluation and provision of chronic care management services.   Care Plan : PharmD - HTN, med mgmt  Updates made by Rennis Petty, RPH-CPP since 03/20/2021 12:00 AM     Problem: Disease Progression      Long-Range Goal: Disease Progression Prevented or Minimized   Start Date: 03/06/2021  Expected End Date: 06/04/2021  Recent Progress: On track  Priority: High  Note:   Current Barriers:  Unable to achieve control of blood pressure    Pharmacist Clinical Goal(s):  patient will achieve control of blood pressure as evidenced by home and in office blood pressure readings through collaboration with PharmD and provider.    Interventions: 1:1 collaboration with [name], DO regarding development and update of comprehensive plan of care as evidenced by provider attestation and co-signature Inter-disciplinary care team collaboration (see longitudinal plan of care) Today reports her cold symptoms are significantly improved, but still has a lingering cough Reports is going to call to reschedule mammogram now that she is feeling better/breast is less tender Provide patient with phone number to reschedule: [facility] [phone]  Medication Adherence: Reports continues to take medications directly from pill bottles Again counsel patient on importance of medication adherence and tools/strategies to aid with adherence such as weekly pillbox, timing of administration and daily alarms Encourage patient to start using weekly pillbox  Note oldest daughter (lives in [place]) helps with her medications  Hypertension: Uncontrolled based on latest home/office readings; current treatment:  Amlodipine 10 mg daily Lisinopril-HCTZ 20-12.5 mg daily Clonidine 0.1 mg daily as needed for elevated blood pressure greater than 180/100/headache Patient has an upper arm blood pressure monitor Reports recent readings: 2/10: 186/87, HR 73 2/11: 172/71, HR 67 2/12: 165/73, HR 63 Patient attributes recent elevated readings to when she was coughing/illness, but denies checking since feeling better Counsel patient on blood pressure monitoring technique Counsel patient to start monitoring home blood pressure daily, keep log and bring record to upcoming appointment with PCP next week Will collaborate with PCP  T2DM: Lab work needed for assessment of control; Appointment scheduled with PCP regarding obtaining further  labs Current treatment: none Advise patient to follow up with [facility] regarding picking up new glucometer and supplies Patient reports will follow up with pharmacy to pick up and will look at a how to use video for glucometer with her daughter over the weekend to restart monitoring  Hyperlipidemia: Lab work needed for assessment of control; Appointment scheduled with PCP regarding obtaining further labs Current treatment: Pravastatin 20 mg daily  Patient Goals/Self-Care Activities patient will:  - take medications as prescribed as evidenced by patient report and record review - check blood pressure, document, and provide at future appointments      Plan: Telephone follow up appointment with care management team member scheduled for:  04/08/2021 at 11:30 AM  Wallace Cullens, PharmD, Para [place], [place] [phone]

## 2021-03-20 NOTE — Patient Instructions (Signed)
Visit Information  Thank you for taking time to visit with me today. Please don't hesitate to contact me if I can be of assistance to you before our next scheduled telephone appointment.  Following are the goals we discussed today:   Goals Addressed             This Visit's Progress    Pharmacy Goals       Please check your home blood pressure, keep a log of the results and bring this with you to your medical appointments.  Feel free to call me with any questions or concerns. I look forward to our next call!   Wallace Cullens, PharmD, Para [place], CPP Clinical Pharmacist [facility] [phone]         Our next appointment is by telephone on 04/08/2021 at 11:30 AM  Please call the care guide team at [phone] if you need to cancel or reschedule your appointment.    The patient verbalized understanding of instructions, educational materials, and care plan provided today and declined offer to receive copy of patient instructions, educational materials, and care plan.

## 2021-03-24 ENCOUNTER — Ambulatory Visit (INDEPENDENT_AMBULATORY_CARE_PROVIDER_SITE_OTHER): Payer: Medicare Other | Admitting: Family Medicine

## 2021-03-24 ENCOUNTER — Encounter: Payer: Self-pay | Admitting: Family Medicine

## 2021-03-24 ENCOUNTER — Other Ambulatory Visit: Payer: Self-pay

## 2021-03-24 VITALS — BP 143/54 | HR 59 | Ht 60.0 in | Wt 134.8 lb

## 2021-03-24 DIAGNOSIS — E785 Hyperlipidemia, unspecified: Secondary | ICD-10-CM

## 2021-03-24 DIAGNOSIS — E1169 Type 2 diabetes mellitus with other specified complication: Secondary | ICD-10-CM

## 2021-03-24 DIAGNOSIS — E1121 Type 2 diabetes mellitus with diabetic nephropathy: Secondary | ICD-10-CM

## 2021-03-24 DIAGNOSIS — I10 Essential (primary) hypertension: Secondary | ICD-10-CM | POA: Diagnosis not present

## 2021-03-24 NOTE — Progress Notes (Signed)
Subjective:    Patient ID: Ellen [name], female    DOB: 12/24/1929, 88 y.o.   MRN: [id number]  Ellen [name] is a 88 y.o. female presenting on 03/24/2021 for Hypertension   HPI  CHRONIC HTN: Current Meds - Amlodipine 10mg  daily, Lisinopril-HCTZ 20-12.5mg  daily Did not take meds today. Denies CP, dyspnea, HA, edema, dizziness / lightheadedness  Home CBG 132 last night  CHRONIC DM, Type 2: Last visit 01/2018. She used to be on Metformin >4 years ago but was taken off of this. Last A1c 6 range, now due today Meds: None Currently on ACEi Lifestyle: - Diet (balanced diet, low carb diet)  - Exercise (limited, but does walking) Due for DM Eye Exam - previously referred to [facility] Denies hypoglycemia, polyuria, visual changes, numbness or tingling.   Health Maintenance: Declines vaccines  Depression screen [facility] 2/9 02/16/2021 04/23/2019 09/21/2018  Decreased Interest 0 0 0  Down, Depressed, Hopeless 0 0 0  PHQ - 2 Score 0 0 0  Altered sleeping 0 - -  Tired, decreased energy 0 - -  Change in appetite 0 - -  Feeling bad or failure about yourself  0 - -  Trouble concentrating 0 - -  Moving slowly or fidgety/restless 0 - -  Suicidal thoughts 0 - -  PHQ-9 Score 0 - -  Difficult doing work/chores Not difficult at all - -    Social History   Tobacco Use   Smoking status: Never   Smokeless tobacco: Never  Vaping Use   Vaping Use: Never used  Substance Use Topics   Alcohol use: Never   Drug use: Never    Review of Systems Per HPI unless specifically indicated above     Objective:    BP (!) 143/54    Pulse (!) 59    Ht 5' (1.524 m)    Wt 134 lb 12.8 oz (61.1 kg)    SpO2 97%    BMI 26.33 kg/m   Wt Readings from Last 3 Encounters:  03/24/21 134 lb 12.8 oz (61.1 kg)  02/26/21 139 lb 8 oz (63.3 kg)  02/16/21 139 lb 3.2 oz (63.1 kg)    Physical Exam Vitals and nursing note reviewed.  Constitutional:      General: She is not in acute distress.    Appearance:  She is well-developed. She is not diaphoretic.     Comments: Well-appearing, comfortable, cooperative  HENT:     Head: Normocephalic and atraumatic.  Eyes:     General:        Right eye: No discharge.        Left eye: No discharge.     Conjunctiva/sclera: Conjunctivae normal.  Neck:     Thyroid: No thyromegaly.  Cardiovascular:     Rate and Rhythm: Normal rate and regular rhythm.     Heart sounds: Normal heart sounds. No murmur heard. Pulmonary:     Effort: Pulmonary effort is normal. No respiratory distress.     Breath sounds: Normal breath sounds. No wheezing or rales.  Musculoskeletal:        General: Normal range of motion.     Cervical back: Normal range of motion and neck supple.  Lymphadenopathy:     Cervical: No cervical adenopathy.  Skin:    General: Skin is warm and dry.     Findings: No erythema or rash.  Neurological:     Mental Status: She is alert and oriented to person, place, and time.  Psychiatric:  Behavior: Behavior normal.     Comments: Well groomed, good eye contact, normal speech and thoughts    Diabetic Foot Exam - Simple   Simple Foot Form Diabetic Foot exam was performed with the following findings: Yes 03/24/2021  9:32 AM  Visual Inspection See comments: Yes Sensation Testing Intact to touch and monofilament testing bilaterally: Yes Pulse Check Posterior Tibialis and Dorsalis pulse intact bilaterally: Yes Comments Overgrown toenails. Dry skin on dorsal feet. No ulceration.      Results for orders placed or performed in visit on 02/16/21  Urine Culture   Specimen: Urine  Result Value Ref Range   MICRO NUMBER: [id number]    SPECIMEN QUALITY: Adequate    Sample Source URINE    STATUS: FINAL    ISOLATE 1:      Mixed genital flora isolated. These superficial bacteria are not indicative of a urinary tract infection. No further organism identification is warranted on this specimen. If clinically indicated, recollect clean-catch, mid-stream  urine and transfer  immediately to Urine Culture Transport Tube.   POCT urinalysis dipstick  Result Value Ref Range   Color, UA Yellow    Clarity, UA Clear    Glucose, UA Negative Negative   Bilirubin, UA Negative    Ketones, UA Negative    Spec Grav, UA 1.020 1.010 - 1.025   Blood, UA Negative    pH, UA 6.0 5.0 - 8.0   Protein, UA Positive (A) Negative   Urobilinogen, UA 0.2 0.2 or 1.0 E.U./dL   Nitrite, UA Negative    Leukocytes, UA Negative Negative   Appearance     Odor        Assessment & Plan:   Problem List Items Addressed This Visit     Type 2 diabetes mellitus with diabetic nephropathy, without long-term current use of insulin ([place]) - Primary   Relevant Orders   Hemoglobin A1c   Hyperlipidemia associated with type 2 diabetes mellitus (HCC)   Relevant Orders   Lipid panel   Essential hypertension   Relevant Orders   COMPLETE METABOLIC PANEL WITH GFR   CBC with Differential/Platelet    T2DM Controlled previously off medication Check A1c Encourage lifestyle modification DM Foot exam Reviewed screening  HTN BP improved now Continue current regimen with improved adherence  HLD Continue Pravastatin  Labs today  Orders Placed This Encounter  Procedures   COMPLETE METABOLIC PANEL WITH GFR   Lipid panel    Order Specific Question:   Has the patient fasted?    Answer:   Yes   Hemoglobin A1c   CBC with Differential/Platelet      No orders of the defined types were placed in this encounter.     Follow up plan: Return in about 4 months (around 07/22/2021) for 4 month follow-up DM A1c, HTN.   [name], [facility] 03/24/2021, 9:26 AM

## 2021-03-24 NOTE — Patient Instructions (Addendum)
Thank you for coming to the office today.  Keep up the great work!  Blood sugar checks.  Keep monitor on BP readings.  Your provider would like to you have your annual eye exam.   [facility]   Address: [address] [place], [place] [address] Phone: [phone]  Website: [email or web address] [address], [place], [place] [address] Phone: [phone] [email or web address]  [facility]  Address: [place], [place], [place] [address] Phone: [phone]   [facility] [address] [place], [place] [address] Phone: [phone]  [facility] Address: [place], [place], [place] [address]  Phone: [phone]   Please schedule a Follow-up Appointment to: Return in about 4 months (around 07/22/2021) for 4 month follow-up DM A1c, HTN.  If you have any other questions or concerns, please feel free to call the office or send a message through [place]. You may also schedule an earlier appointment if necessary.  Additionally, you may be receiving a survey about your experience at our office within a few days to 1 week by e-mail or mail. We value your feedback.  [name], DO [place]

## 2021-03-25 LAB — COMPLETE METABOLIC PANEL WITH GFR
AG Ratio: 1 (calc) (ref 1.0–2.5)
ALT: 6 U/L (ref 6–29)
AST: 18 U/L (ref 10–35)
Albumin: 3.6 g/dL (ref 3.6–5.1)
Alkaline phosphatase (APISO): 49 U/L (ref 37–153)
BUN/Creatinine Ratio: 19 (calc) (ref 6–22)
BUN: 21 mg/dL (ref 7–25)
CO2: 29 mmol/L (ref 20–32)
Calcium: 9.3 mg/dL (ref 8.6–10.4)
Chloride: 103 mmol/L (ref 98–110)
Creat: 1.09 mg/dL — ABNORMAL HIGH (ref 0.60–0.95)
Globulin: 3.6 g/dL (calc) (ref 1.9–3.7)
Glucose, Bld: 92 mg/dL (ref 65–99)
Potassium: 4.1 mmol/L (ref 3.5–5.3)
Sodium: 140 mmol/L (ref 135–146)
Total Bilirubin: 0.5 mg/dL (ref 0.2–1.2)
Total Protein: 7.2 g/dL (ref 6.1–8.1)
eGFR: 49 mL/min/{1.73_m2} — ABNORMAL LOW (ref >=60)

## 2021-03-25 LAB — CBC WITH DIFFERENTIAL/PLATELET
Absolute Monocytes: 435 cells/uL (ref 200–950)
Basophils Absolute: 50 cells/uL (ref 0–200)
Basophils Relative: 1 %
Eosinophils Absolute: 130 cells/uL (ref 15–500)
Eosinophils Relative: 2.6 %
HCT: 35.8 % (ref 35.0–45.0)
Hemoglobin: 11.3 g/dL — ABNORMAL LOW (ref 11.7–15.5)
Lymphs Abs: 2520 cells/uL (ref 850–3900)
MCH: 26.8 pg — ABNORMAL LOW (ref 27.0–33.0)
MCHC: 31.6 g/dL — ABNORMAL LOW (ref 32.0–36.0)
MCV: 84.8 fL (ref 80.0–100.0)
MPV: 9.5 fL (ref 7.5–12.5)
Monocytes Relative: 8.7 %
Neutro Abs: 1865 cells/uL (ref 1500–7800)
Neutrophils Relative %: 37.3 %
Platelets: 301 10*3/uL (ref 140–400)
RBC: 4.22 10*6/uL (ref 3.80–5.10)
RDW: 14.3 % (ref 11.0–15.0)
Total Lymphocyte: 50.4 %
WBC: 5 10*3/uL (ref 3.8–10.8)

## 2021-03-25 LAB — HEMOGLOBIN A1C
Hgb A1c MFr Bld: 5.9 % of total Hgb — ABNORMAL HIGH (ref <5.7)
Mean Plasma Glucose: 123 mg/dL
eAG (mmol/L): 6.8 mmol/L

## 2021-03-25 LAB — LIPID PANEL
Cholesterol: 206 mg/dL — ABNORMAL HIGH (ref <200)
HDL: 65 mg/dL (ref >=50)
LDL Cholesterol (Calc): 124 mg/dL (calc) — ABNORMAL HIGH
Non-HDL Cholesterol (Calc): 141 mg/dL (calc) — ABNORMAL HIGH (ref <130)
Total CHOL/HDL Ratio: 3.2 (calc) (ref <5.0)
Triglycerides: 75 mg/dL (ref <150)

## 2021-03-25 NOTE — Chronic Care Management (AMB) (Signed)
Chronic Care Management    Clinical Social Work Note  03/25/2021 Name: Ellen [name] MRN: [id number] DOB: 1930/01/26  Ellen [name] is a 88 y.o. year old female who is a primary care patient of [name], DO. The CCM team was consulted to assist the patient with chronic disease management and/or care coordination needs related to: [employer] .   Engaged with patient by telephone for initial visit in response to provider referral for social work chronic care management and care coordination services.   Consent to Services:  The patient was given the following information about Chronic Care Management services today, agreed to services, and gave verbal consent: 1. CCM service includes personalized support from designated clinical staff supervised by the primary care provider, including individualized plan of care and coordination with other care providers 2. 24/7 contact phone numbers for assistance for urgent and routine care needs. 3. Service will only be billed when office clinical staff spend 20 minutes or more in a month to coordinate care. 4. Only one practitioner may furnish and bill the service in a calendar month. 5.The patient may stop CCM services at any time (effective at the end of the month) by phone call to the office staff. 6. The patient will be responsible for cost sharing (co-pay) of up to 20% of the service fee (after annual deductible is met). Patient agreed to services and consent obtained.  Patient agreed to services and consent obtained.   Summary: Assessed patient's previous and current treatment, coping skills, support system and barriers to care. Patient reports that she is capable of managing health conditions without the support of CCM LCSW. She denies any resource needs.    Recommendation: Patient may benefit from, and is in agreement with continuing to work with PCP.   Follow up Plan:  Patient does not desire continued follow-up by CCM LCSW.  Will contact the office if needed Provider has been informed, If further intervention is needed the care management team is available to follow up after a formal CCM referral is placed.    SDOH (Social Determinants of Health) assessments and interventions performed:    Advanced Directives Status: Not addressed in this encounter.  CCM Care Plan  Allergies  Allergen Reactions   Codeine Nausea Only   Morphine And Related Nausea Only    Outpatient Encounter Medications as of 03/17/2021  Medication Sig   Accu-Chek Softclix Lancets lancets Use to check blood sugar up to twice daily   acetaminophen (TYLENOL) 500 MG tablet Take 1,000 mg by mouth every 6 (six) hours as needed for moderate pain or headache.    amLODipine (NORVASC) 10 MG tablet Take 1 tablet (10 mg total) by mouth daily.   aspirin 81 MG chewable tablet Chew 81 mg by mouth daily.    Blood Glucose Monitoring Suppl (ACCU-CHEK GUIDE) w/Device KIT Use as directed to monitor blood sugar   cloNIDine (CATAPRES) 0.1 MG tablet TAKE 1 TABLET BY MOUTH DAILY AS NEEDED FOR ELEVATED BLOOD PRESSURE GREATER THAN 180/100/HEADACHE   diclofenac Sodium (VOLTAREN) 1 % GEL Apply 2 g topically 4 (four) times daily as needed (arthritis knee, elbow). (Patient not taking: Reported on 03/06/2021)   gabapentin (NEURONTIN) 300 MG capsule TAKE 1 CAPSULE(300 MG) BY MOUTH IN THE MORNING AND AT BEDTIME (Patient not taking: Reported on 03/06/2021)   glucose blood (ACCU-CHEK GUIDE) test strip Use to check blood sugar up to twice daily   Lancets (ONETOUCH ULTRASOFT) lancets Use to check blood sugar daily, up to 2  times if need   lisinopril-hydrochlorothiazide (ZESTORETIC) 20-12.5 MG tablet Take 1 tablet by mouth daily.   Multiple Vitamins-Minerals (CERTAVITE SENIOR/ANTIOXIDANT) TABS Take by mouth.   pravastatin (PRAVACHOL) 20 MG tablet Take 1 tablet (20 mg total) by mouth at bedtime.   No facility-administered encounter medications on file as of 03/17/2021.    Patient  Active Problem List   Diagnosis Date Noted   Multinodular goiter 12/25/2019   Aortic atherosclerosis ([place]) 12/18/2019   Genetic testing 10/06/2018   Family history of ovarian cancer    Rotator cuff arthropathy of right shoulder 06/16/2018   Rotator cuff arthropathy of left shoulder 03/13/2018   Chronic pain of both shoulders 03/13/2018   Primary cancer of upper outer quadrant of left female breast ([place]) 01/08/2018   Type 2 diabetes mellitus with diabetic nephropathy, without long-term current use of insulin ([place]) 11/10/2017   Chronic headaches 11/10/2017   Hyperlipidemia associated with type 2 diabetes mellitus ([place]) 11/10/2017   Essential hypertension 11/10/2017   Enteritis 09/18/2017    Conditions to be addressed/monitored: HTN, DMII, and Chronic Pain  There are no care plans that you recently modified to display for this patient.    Christa See, MSW, [facility] Care Management [email or web address] Phone [phone] 4:30 PM

## 2021-03-25 NOTE — Patient Instructions (Signed)
Visit Information  Thank you for taking time to visit with me today. Please don't hesitate to contact me if I can be of assistance to you before our next scheduled telephone appointment.   If you are experiencing a Mental Health or [place] or need someone to talk to, please call the Suicide and Crisis Lifeline: 988 call 911   Following is a copy of your full plan of care:  There are no care plans that you recently modified to display for this patient.   Ellen [name] was given information about Care Management services by the embedded care coordination team including:  Care Management services include personalized support from designated clinical staff supervised by her physician, including individualized plan of care and coordination with other care providers 24/7 contact phone numbers for assistance for urgent and routine care needs. The patient may stop CCM services at any time (effective at the end of the month) by phone call to the office staff.  Patient agreed to services and verbal consent obtained.   Patient verbalizes understanding of instructions and care plan provided today and agrees to view in [place]. Active MyChart status confirmed with patient.    No further follow up required: Patient does not desire continued follow-up. PCP was notified  Christa See, MSW, [facility] Management [email or web address] Phone [phone] 4:33 PM

## 2021-03-27 ENCOUNTER — Telehealth: Payer: Self-pay | Admitting: Family Medicine

## 2021-03-27 NOTE — Telephone Encounter (Signed)
Left message for patient to call back and schedule Medicare Annual Wellness Visit (AWV) to be done virtually or by telephone.  No hx of AWV eligible as of 02/01/09  Please schedule at anytime with [facility].      40 Minutes appointment   Any questions, please call me at [phone]

## 2021-03-31 DIAGNOSIS — C50412 Malignant neoplasm of upper-outer quadrant of left female breast: Secondary | ICD-10-CM

## 2021-03-31 DIAGNOSIS — E785 Hyperlipidemia, unspecified: Secondary | ICD-10-CM | POA: Diagnosis not present

## 2021-03-31 DIAGNOSIS — I10 Essential (primary) hypertension: Secondary | ICD-10-CM

## 2021-03-31 DIAGNOSIS — E1169 Type 2 diabetes mellitus with other specified complication: Secondary | ICD-10-CM | POA: Diagnosis not present

## 2021-03-31 DIAGNOSIS — E1121 Type 2 diabetes mellitus with diabetic nephropathy: Secondary | ICD-10-CM | POA: Diagnosis not present

## 2021-04-08 ENCOUNTER — Telehealth: Payer: Self-pay | Admitting: Pharmacist

## 2021-04-08 ENCOUNTER — Telehealth: Payer: Medicare Other

## 2021-04-08 NOTE — Telephone Encounter (Signed)
?  Chronic Care Management  ? ?Outreach Note ? ?04/08/2021 ?Name: Ellen [name] MRN: [id number] DOB: [birth year] ? ?Referred by: [name], DO ?Reason for referral : No chief complaint on file. ? ? ?Was unable to reach patient via telephone today and have left HIPAA compliant voicemail asking patient to return my call.  ? ? ?Follow Up Plan: CM Pharmacist will attempt to reach patient by telephone again in the next 14 days ? ?Wallace Cullens, PharmD, BCACP ?Clinical Pharmacist ?[place] Management ?[phone] ? ?

## 2021-04-10 ENCOUNTER — Ambulatory Visit (INDEPENDENT_AMBULATORY_CARE_PROVIDER_SITE_OTHER): Payer: Medicare Other | Admitting: Pharmacist

## 2021-04-10 DIAGNOSIS — E1169 Type 2 diabetes mellitus with other specified complication: Secondary | ICD-10-CM

## 2021-04-10 DIAGNOSIS — E785 Hyperlipidemia, unspecified: Secondary | ICD-10-CM

## 2021-04-10 DIAGNOSIS — I10 Essential (primary) hypertension: Secondary | ICD-10-CM

## 2021-04-10 NOTE — Patient Instructions (Signed)
Visit Information ? ?Thank you for taking time to visit with me today. Please don't hesitate to contact me if I can be of assistance to you before our next scheduled telephone appointment. ? ?Following are the goals we discussed today:  ? Goals Addressed   ? ?  ?  ?  ?  ? This Visit's Progress  ?  Pharmacy Goals     ?  Please check your home blood pressure, keep a log of the results and bring this with you to your medical appointments. ? ?Feel free to call me with any questions or concerns. I look forward to our next call! ? ?Wallace Cullens, PharmD, BCACP, CPP ?Clinical Pharmacist ?[facility] ?[place] ?[phone] ?  ? ?  ? ? ? ?Our next appointment is by telephone on 4/7 at 10:45 am ? ?Please call the care guide team at [phone] if you need to cancel or reschedule your appointment.  ? ? ?Patient verbalizes understanding of instructions and care plan provided today and agrees to view in [place]. Active MyChart status confirmed with patient.   ? ?

## 2021-04-10 NOTE — Chronic Care Management (AMB) (Signed)
? ?Chronic Care Management ?CCM Pharmacy Note ? ?04/10/2021 ?Name:  Ellen [name] MRN:  [id number] DOB:  03-02-1929 ? ? ?Subjective: ?Ellen [name] is an 88 y.o. year old female who is a primary patient of Ellen Hauser, DO.  The CCM team was consulted for assistance with disease management and care coordination needs.   ? ?Engaged with patient by telephone for follow up visit for pharmacy case management and/or care coordination services.  ? ?Objective: ? ?Medications Reviewed Today   ? ? Reviewed by Ellen Hauser, DO (Physician) on 03/24/21 at [phone]  Med List Status: <None>  ? ?Medication Order Taking? Sig Documenting Provider Last Dose Status Informant  ?Accu-Chek Softclix Lancets lancets [id number] Yes Use to check blood sugar up to twice daily Ellen Hauser, DO Taking Active   ?acetaminophen (TYLENOL) 500 MG tablet [id number] Yes Take 1,000 mg by mouth every 6 (six) hours as needed for moderate pain or headache.  [provider] Taking Active Self  ?amLODipine (NORVASC) 10 MG tablet [id number] Yes Take 1 tablet (10 mg total) by mouth daily. Ellen Hauser, DO Taking Active   ?aspirin 81 MG chewable tablet [id number] Yes Chew 81 mg by mouth daily.  [provider] Taking Active Self  ?         ?Med Note [name], [place]   Wed Jun 07, 2018 11:10 AM)    ?Blood Glucose Monitoring Suppl (ACCU-CHEK GUIDE) w/Device KIT [id number] Yes Use as directed to monitor blood sugar [place], Devonne Doughty, DO Taking Active   ?cloNIDine (CATAPRES) 0.1 MG tablet [id number] Yes TAKE 1 TABLET BY MOUTH DAILY AS NEEDED FOR ELEVATED BLOOD PRESSURE GREATER THAN 180/100/HEADACHE Ellen Hauser, DO Taking Active   ?diclofenac Sodium (VOLTAREN) 1 % GEL [id number] No Apply 2 g topically 4 (four) times daily as needed (arthritis knee, elbow).  ?Patient not taking: Reported on 03/06/2021  ? Ellen Hauser, DO Not Taking Active   ?gabapentin (NEURONTIN) 300 MG capsule  [id number] No TAKE 1 CAPSULE(300 MG) BY MOUTH IN THE MORNING AND AT BEDTIME  ?Patient not taking: Reported on 03/06/2021  ? Ellen Hauser, DO Not Taking Active   ?glucose blood (ACCU-CHEK GUIDE) test strip [id number] Yes Use to check blood sugar up to twice daily Ellen Hauser, DO Taking Active   ?Lancets (ONETOUCH ULTRASOFT) lancets [id number] Yes Use to check blood sugar daily, up to 2 times if need Ellen Hauser, DO Taking Active   ?lisinopril-hydrochlorothiazide (ZESTORETIC) 20-12.5 MG tablet [id number] Yes Take 1 tablet by mouth daily. Ellen Hauser, DO Taking Active   ?Multiple Vitamins-Minerals (CERTAVITE SENIOR/ANTIOXIDANT) TABS [id number] Yes Take by mouth. [provider] Taking Active   ?pravastatin (PRAVACHOL) 20 MG tablet [id number] Yes Take 1 tablet (20 mg total) by mouth at bedtime. Ellen Hauser, DO Taking Active   ? ?  ?  ? ?  ? ? ?Pertinent Labs:  ?Lab Results  ?Component Value Date  ? HGBA1C 5.9 (H) 03/24/2021  ? ?Lab Results  ?Component Value Date  ? CHOL 206 (H) 03/24/2021  ? HDL 65 03/24/2021  ? LDLCALC 124 (H) 03/24/2021  ? TRIG 75 03/24/2021  ? CHOLHDL 3.2 03/24/2021  ? ?Lab Results  ?Component Value Date  ? CREATININE 1.09 (H) 03/24/2021  ? BUN 21 03/24/2021  ? NA 140 03/24/2021  ? K 4.1 03/24/2021  ? CL 103 03/24/2021  ? CO2 29 03/24/2021  ? ? ?SDOH:  (Social Determinants of Health) assessments and interventions performed:  ? ? ?  CCM Care Plan ? ?Review of patient past medical history, allergies, medications, health status, including review of consultants reports, laboratory and other test data, was performed as part of comprehensive evaluation and provision of chronic care management services.  ? ?Care Plan : PharmD - HTN, med mgmt  ?Updates made by Rennis Petty, RPH-CPP since 04/10/2021 12:00 AM  ?  ? ?Problem: Disease Progression   ?  ? ?Long-Range Goal: Disease Progression Prevented or Minimized   ?Start Date: 03/06/2021   ?Expected End Date: 06/04/2021  ?Recent Progress: On track  ?Priority: High  ?Note:   ?Current Barriers:  ?Unable to achieve control of blood pressure  ? ?Pharmacist Clinical Goal(s):  ?patient will achieve control of blood pressure as evidenced by home and in office blood pressure readings through collaboration with PharmD and provider.  ? ? ?Interventions: ?1:1 collaboration with Ellen Hauser, DO regarding development and update of comprehensive plan of care as evidenced by provider attestation and co-signature ?Inter-disciplinary care team collaboration (see longitudinal plan of care) ?Perform chart review. Patient seen for Office Visit with PCP on 2/21  ?Provider advised patient to call to schedule annual eye exam ?Reports is going to call to reschedule mammogram now that she is feeling better/breast is less tender ?Again provide patient with phone number to reschedule: [place] [phone] ?Encourage patient to call to schedule annual eye exam ? ?Medication Adherence: ?Reports started using weekly pillbox to organize her medications ?Denies missed doses ?Note oldest daughter (lives in [place]) helps with her medications ? ?Hypertension: ?Current treatment:  ?Amlodipine 10 mg daily ?Lisinopril-HCTZ 20-12.5 mg daily ?Clonidine 0.1 mg daily as needed for elevated blood pressure greater than 180/100/headache ?Patient has an upper arm blood pressure monitor ?Reports recent home blood pressure readings have been improved; this morning: 140/56, HR 63 ?Counsel patient to continue to monitor home blood pressure, keep log and bring record an to call office for readings outside of established parameters ? ?T2DM: ?Controlled; current treatment: none ?Reports picked up and using new glucometer ?Discuss importance of having regular well-balanced meals, while limiting carbohydrate portion sizes ? ?Hyperlipidemia: ?Uncontrolled; current treatment: Pravastatin 20 mg daily ?Counsel on importance of  dietary choices to aid with cholesterol control ?Encourage patient to review nutrition labels to limit saturated and trans fats ?Encourage dietary changes including switching to leaner meets and using reduced fat, rather than full fat, dairy products  ? ?Patient Goals/Self-Care Activities ?patient will:  ?- take medications as prescribed as evidenced by patient report and record review ?- check blood pressure, document, and provide at future appointments ?  ?  ? ?Plan: Telephone follow up appointment with care management team member scheduled for:  4/7 at 10:45 am ? ?Wallace Cullens, PharmD, BCACP, CPP ?Clinical Pharmacist ?[facility] ?[place] ?[phone] ? ? ? ? ? ?

## 2021-04-13 ENCOUNTER — Ambulatory Visit: Payer: Self-pay

## 2021-04-13 NOTE — Telephone Encounter (Signed)
? ?  Chief Complaint: Shortness of breath with exertion,that started after weed eating her yard ?Symptoms: SOB ?Frequency: This weekend ?Pertinent Negatives: Patient denies Cough or congestion ?Disposition: '[]'$ ED /'[]'$ Urgent Care (no appt availability in office) / '[]'$ Appointment(In office/virtual)/ '[]'$  [place] Virtual [facility]/ '[]'$ Home Care/ '[]'$ Refused Recommended Disposition /'[]'$  Mobile Bus/ '[]'$  Follow-up with PCP ?Additional Notes: Pt. Requests morning appointment. No availability - would like to be worked in. Please advise.  ? ?Answer Assessment - Initial Assessment Questions ?1. RESPIRATORY STATUS: "Describe your breathing?" (e.g., wheezing, shortness of breath, unable to speak, severe coughing)  ?    Shortness of breath ?2. ONSET: "When did this breathing problem begin?"  ?    Weekend ?3. PATTERN "Does the difficult breathing come and go, or has it been constant since it started?"  ?    Comes and goes ?4. SEVERITY: "How bad is your breathing?" (e.g., mild, moderate, severe)  ?  - MILD: No SOB at rest, mild SOB with walking, speaks normally in sentences, can lie down, no retractions, pulse < 100.  ?  - MODERATE: SOB at rest, SOB with minimal exertion and prefers to sit, cannot lie down flat, speaks in phrases, mild retractions, audible wheezing, pulse 100-120.  ?  - SEVERE: Very SOB at rest, speaks in single words, struggling to breathe, sitting hunched forward, retractions, pulse > 120  ?    Mild ?5. RECURRENT SYMPTOM: "Have you had difficulty breathing before?" If Yes, ask: "When was the last time?" and "What happened that time?"  ?    No ?6. CARDIAC HISTORY: "Do you have any history of heart disease?" (e.g., heart attack, angina, bypass surgery, angioplasty)  ?    No ?7. LUNG HISTORY: "Do you have any history of lung disease?"  (e.g., pulmonary embolus, asthma, emphysema) ?    No ?8. CAUSE: "What do you think is causing the breathing problem?"  ?    Unsure ?9. OTHER SYMPTOMS: "Do you have any other symptoms?  (e.g., dizziness, runny nose, cough, chest pain, fever) ?    No ?10. O2 SATURATION MONITOR:  "Do you use an oxygen saturation monitor (pulse oximeter) at home?" If Yes, "What is your reading (oxygen level) today?" "What is your usual oxygen saturation reading?" (e.g., 95%) ?      No ?11. PREGNANCY: "Is there any chance you are pregnant?" "When was your last menstrual period?" ?      No ?12. TRAVEL: "Have you traveled out of the country in the last month?" (e.g., travel history, exposures) ?      No ? ?Protocols used: Breathing Difficulty-A-AH ? ?

## 2021-04-17 ENCOUNTER — Ambulatory Visit: Payer: Medicare Other | Admitting: Family Medicine

## 2021-04-17 ENCOUNTER — Ambulatory Visit: Payer: Medicare Other

## 2021-04-20 ENCOUNTER — Telehealth: Payer: Self-pay | Admitting: Family Medicine

## 2021-04-20 NOTE — Telephone Encounter (Signed)
Patient called stating she was having problems with her memory. Her daughter ordered Fish farm manager. Patient wanted to make sure it was ok for her to take. Would like a call back from nurse or Dr Raliegh Ip.  ?

## 2021-04-20 NOTE — Telephone Encounter (Signed)
Okay to take the Prevagen that is safe for her. ? ?[name], DO ?[facility] ?[facility] ?04/20/2021, 3:16 PM ? ?

## 2021-04-21 NOTE — Telephone Encounter (Signed)
Left a message letting her know that she can take the medication.  ?

## 2021-04-23 ENCOUNTER — Telehealth: Payer: Medicare Other

## 2021-04-23 ENCOUNTER — Telehealth: Payer: Self-pay

## 2021-04-23 NOTE — Telephone Encounter (Signed)
?  Care Management  ? ?Follow Up Note ? ? ?04/23/2021 ?Name: Ellen [name] MRN: [id number] DOB: [birth year] ? ? ?Referred by: [name], DO ?Reason for referral : Chronic Care Management (RNCM: Follow up for Chronic Disease Management and Care Coordination Needs) ? ? ?An unsuccessful telephone outreach was attempted today. The patient was referred to the case management team for assistance with care management and care coordination.  ? ?Follow Up Plan: A HIPPA compliant phone message was left for the patient providing contact information and requesting a return call.  ? ?Noreene Larsson RN, MSN, CCM ?Community Care Coordinator ?[facility] ?[facility] ?Mobile: [phone]  ?

## 2021-04-30 ENCOUNTER — Telehealth: Payer: Self-pay

## 2021-04-30 NOTE — Chronic Care Management (AMB) (Signed)
?  Care Management  ? ?Note ? ?04/30/2021 ?Name: Tomeka Kantner MRN: [id number] DOB: 12/26/1929 ? ?Kattie Santoyo is a 87 y.o. year old female who is a primary care patient of [name], DO and is actively engaged with the care management team. I reached out to [employer] by phone today to assist with re-scheduling a follow up visit with the RN Case Manager ? ?Follow up plan: ?Unsuccessful telephone outreach attempt made. A HIPAA compliant phone message was left for the patient providing contact information and requesting a return call.  ?The care management team will reach out to the patient again over the next 7 days.  ?If patient returns call to provider office, please advise to call [place]  at [phone] ? ?Noreene Larsson, RMA ?Care Guide, Embedded Care Coordination ?[place]  Care Management  ?[place], [place] [address] ?Direct Dial: [phone] ?[email or web address] ?Website: [email or web address]  ? ?

## 2021-05-01 DIAGNOSIS — E1159 Type 2 diabetes mellitus with other circulatory complications: Secondary | ICD-10-CM | POA: Diagnosis not present

## 2021-05-01 DIAGNOSIS — I10 Essential (primary) hypertension: Secondary | ICD-10-CM | POA: Diagnosis not present

## 2021-05-01 DIAGNOSIS — E785 Hyperlipidemia, unspecified: Secondary | ICD-10-CM | POA: Diagnosis not present

## 2021-05-03 ENCOUNTER — Emergency Department: Payer: Medicare Other

## 2021-05-03 ENCOUNTER — Encounter: Payer: Self-pay | Admitting: Emergency Medicine

## 2021-05-03 ENCOUNTER — Emergency Department
Admission: EM | Admit: 2021-05-03 | Discharge: 2021-05-03 | Disposition: A | Discharge status: Home / Self Care | Payer: Medicare Other | Attending: Emergency Medicine | Admitting: Emergency Medicine

## 2021-05-03 ENCOUNTER — Other Ambulatory Visit: Payer: Self-pay

## 2021-05-03 DIAGNOSIS — S8992XA Unspecified injury of left lower leg, initial encounter: Secondary | ICD-10-CM | POA: Diagnosis not present

## 2021-05-03 DIAGNOSIS — S6991XA Unspecified injury of right wrist, hand and finger(s), initial encounter: Secondary | ICD-10-CM | POA: Diagnosis present

## 2021-05-03 DIAGNOSIS — M25569 Pain in unspecified knee: Secondary | ICD-10-CM | POA: Diagnosis not present

## 2021-05-03 DIAGNOSIS — W19XXXA Unspecified fall, initial encounter: Secondary | ICD-10-CM

## 2021-05-03 DIAGNOSIS — M7989 Other specified soft tissue disorders: Secondary | ICD-10-CM | POA: Insufficient documentation

## 2021-05-03 DIAGNOSIS — M19041 Primary osteoarthritis, right hand: Secondary | ICD-10-CM | POA: Diagnosis not present

## 2021-05-03 DIAGNOSIS — M19021 Primary osteoarthritis, right elbow: Secondary | ICD-10-CM | POA: Diagnosis not present

## 2021-05-03 DIAGNOSIS — M25462 Effusion, left knee: Secondary | ICD-10-CM | POA: Diagnosis not present

## 2021-05-03 DIAGNOSIS — M1712 Unilateral primary osteoarthritis, left knee: Secondary | ICD-10-CM | POA: Diagnosis not present

## 2021-05-03 DIAGNOSIS — Z853 Personal history of malignant neoplasm of breast: Secondary | ICD-10-CM | POA: Insufficient documentation

## 2021-05-03 DIAGNOSIS — E119 Type 2 diabetes mellitus without complications: Secondary | ICD-10-CM | POA: Diagnosis not present

## 2021-05-03 DIAGNOSIS — M79621 Pain in right upper arm: Secondary | ICD-10-CM | POA: Insufficient documentation

## 2021-05-03 DIAGNOSIS — M79631 Pain in right forearm: Secondary | ICD-10-CM | POA: Diagnosis not present

## 2021-05-03 DIAGNOSIS — W010XXA Fall on same level from slipping, tripping and stumbling without subsequent striking against object, initial encounter: Secondary | ICD-10-CM | POA: Diagnosis not present

## 2021-05-03 DIAGNOSIS — M19011 Primary osteoarthritis, right shoulder: Secondary | ICD-10-CM | POA: Diagnosis not present

## 2021-05-03 DIAGNOSIS — S62346A Nondisplaced fracture of base of fifth metacarpal bone, right hand, initial encounter for closed fracture: Secondary | ICD-10-CM | POA: Insufficient documentation

## 2021-05-03 DIAGNOSIS — I10 Essential (primary) hypertension: Secondary | ICD-10-CM | POA: Diagnosis not present

## 2021-05-03 DIAGNOSIS — M19031 Primary osteoarthritis, right wrist: Secondary | ICD-10-CM | POA: Diagnosis not present

## 2021-05-03 DIAGNOSIS — S59911A Unspecified injury of right forearm, initial encounter: Secondary | ICD-10-CM | POA: Diagnosis not present

## 2021-05-03 DIAGNOSIS — S4991XA Unspecified injury of right shoulder and upper arm, initial encounter: Secondary | ICD-10-CM | POA: Diagnosis not present

## 2021-05-03 DIAGNOSIS — Y92096 Garden or yard of other non-institutional residence as the place of occurrence of the external cause: Secondary | ICD-10-CM | POA: Insufficient documentation

## 2021-05-03 DIAGNOSIS — S62316A Displaced fracture of base of fifth metacarpal bone, right hand, initial encounter for closed fracture: Secondary | ICD-10-CM | POA: Diagnosis not present

## 2021-05-03 MED ORDER — OXYCODONE-ACETAMINOPHEN 5-325 MG PO TABS: 1.0000 | ORAL_TABLET | Freq: Four times a day (QID) | ORAL | 0 refills | Status: DC | PRN

## 2021-05-03 NOTE — ED Triage Notes (Signed)
Pt reports slipped and fell a week ago and hurt her right hand. Pt states thought it would get better but still painful with movement. Denies LOC or other injuries ?

## 2021-05-03 NOTE — ED Notes (Signed)
E-signature pad not functional. 

## 2021-05-03 NOTE — Discharge Instructions (Addendum)
-  Control pain with ibuprofen as needed.  Utilize oxycodone/acetaminophen sparingly. ?-Follow-up with the orthopedist listed above, as discussed.  Please keep your brace on until you can see the orthopedist. ?-Return to the emergency department anytime if you begin to experience any new or worsening symptoms. ?

## 2021-05-03 NOTE — ED Provider Notes (Signed)
? ?[facility] ?Provider Note ? ? ? Event Date/Time  ? First MD Initiated Contact with Patient 05/03/21 1003   ?  (approximate) ? ? ?History  ? ?Chief Complaint ?Fall ? ? ?HPI ?Ellen [name] is a 88 y.o. female, in hypertension, type 2 diabetes, chronic headaches, breast cancer, presents to the emergency department for evaluation of injury sustained from fall.  Patient states that she slipped in her yard approximately a week ago when her foot got caught on a tree stump.  She states she landed on her right side.  She states that she did not feel any significant pain at the time, however her pain has gradually worsened over the past week.  Endorsing pain in the right hand, right forearm, and right upper arm.  She maintains normal range of motion, though states that her hand/forearm has been increasingly more swollen.  She additionally states that she may have injured her left knee as well. Denies head injury, LOC, blood thinner use, preceding symptoms prior to fall, numbness/tingling in upper or lower extremities, chest pain, shortness of breath, abdominal pain, neck pain, back pain, or headache. ? ?History Limitations: No limitations ? ?  ? ? ?Physical Exam  ?Triage Vital Signs: ?ED Triage Vitals [05/03/21 1001]  ?Enc Vitals Group  ?   BP   ?   Pulse   ?   Resp   ?   Temp   ?   Temp src   ?   SpO2   ?   Weight 134 lb 7.7 oz (61 kg)  ?   Height 5' (1.524 m)  ?   Head Circumference   ?   Peak Flow   ?   Pain Score 4  ?   Pain Loc   ?   Pain Edu?   ?   Excl. in [place]?   ? ? ?Most recent vital signs: ?Vitals:  ? 05/03/21 1005 05/03/21 1159  ?BP: (!) 170/104 (!) 186/73  ?Pulse: 84 65  ?Resp: 16 16  ?Temp: 97.8 ?F (36.6 ?C)   ?SpO2: 97% 97%  ? ? ?General: Awake, NAD.  ?Skin: Warm, dry.  ?CV: Good peripheral perfusion.  ?Resp: Normal effort.  ?Abd: Soft, non-tender. No distention.  ?Neuro: At baseline. No gross neurological deficits.  ?Other: No gross deformities appreciated in the right upper extremity.   Mild swelling appreciated in the right forearm.  Patient maintains good grip strength.  Tenderness appreciated along the distal radius/ulna region, as well as the metacarpals.  No snuffbox tenderness.  Pulse, motor, sensation intact distally. ? ?No gross deformities appreciated on the left knee.  Patient maintains normal range of motion.  She does endorse tenderness along the medial aspect of her knee.  Negative anterior drawer.  Mild pain with on medial aspect with valgus/varus maneuvering. ? ?Physical Exam ? ? ? ?ED Results / Procedures / Treatments  ?Labs ?(all labs ordered are listed, but only abnormal results are displayed) ?Labs Reviewed - No data to display ? ? ?EKG ?Not applicable ? ? ?RADIOLOGY ? ?ED Provider Interpretation: I personally reviewed and interpreted these x-rays.  Forearm x-ray shows no acute findings.  Knee x-ray shows small joint effusion.  Humerus x-ray shows no acute abnormalities.  Hand x-ray shows fracture at the base of the fifth metacarpal. ? ?DG Forearm Right ? ?Result Date: 05/03/2021 ?CLINICAL DATA:  Trauma, fall EXAM: RIGHT FOREARM - 2 VIEW COMPARISON:  None. FINDINGS: No recent fracture or dislocation is seen. Degenerative changes are  noted in the right elbow and right wrist. IMPRESSION: No recent fracture or dislocation is seen in the right forearm. Electronically Signed   By: Elmer Picker M.D.   On: 05/03/2021 11:09  ? ?DG Knee Complete 4 Views Left ? ?Result Date: 05/03/2021 ?CLINICAL DATA:  Trauma, fall EXAM: LEFT KNEE - COMPLETE 4+ VIEW COMPARISON:  None. FINDINGS: No recent fracture or dislocation is seen. There is soft tissue fullness in the suprapatellar bursa suggesting small to moderate effusion. Degenerative changes are noted with bony spurs in medial, lateral and patellofemoral compartments. Chondrocalcinosis is seen in the meniscal cartilages. Smooth marginated calcification adjacent to the medial condyle of the distal femur may be residual from previous injury.  Scattered arterial calcifications are seen in the soft tissues. IMPRESSION: No recent fracture or dislocation is seen in the left knee. Small to moderate effusion. Degenerative changes are noted. Electronically Signed   By: Elmer Picker M.D.   On: 05/03/2021 11:13  ? ?DG Humerus Right ? ?Result Date: 05/03/2021 ?CLINICAL DATA:  Trauma, fall EXAM: RIGHT HUMERUS - 2+ VIEW COMPARISON:  None. FINDINGS: There is deformity in the distal shaft of humerus suggesting old malunited fracture. Degenerative changes are noted in the right elbow. Degenerative changes are noted in the right shoulder. There is decreased distance between acromion and humeral head suggesting chronic tear of rotator cuff. There are smooth marginated calcifications adjacent to the lateral aspect of acromion suggesting residual change from previous trauma or degenerative arthritis. IMPRESSION: No recent fracture or dislocation is seen. Deformity in the distal shaft of right humerus suggests old malunited fracture. Degenerative changes are noted in the right shoulder and right elbow. Electronically Signed   By: Elmer Picker M.D.   On: 05/03/2021 11:11  ? ?DG Hand Complete Right ? ?Result Date: 05/03/2021 ?CLINICAL DATA:  Status post fall. EXAM: RIGHT HAND - COMPLETE 3+ VIEW COMPARISON:  None. FINDINGS: Bones are diffusely osteopenic. There is an acute, intra-articular fracture deformity involving the base of the fifth metacarpal bone. Mild ulnar displacement of the distal fracture fragments noted. No additional fracture or dislocation identified. Scattered degenerative changes are noted including at the basilar joint and the D IP joints as well as the radiocarpal joint. Chondrocalcinosis is noted within the radiocarpal joint. IMPRESSION: 1. Acute, intra-articular fracture deformity involves the base of the fifth metacarpal bone. 2. Chondrocalcinosis and osteoarthritis. 3. Osteopenia. Electronically Signed   By: Kerby Moors M.D.   On:  05/03/2021 11:08   ? ?PROCEDURES: ? ?Critical Care performed: None. ? ?Procedures ? ? ? ?MEDICATIONS ORDERED IN ED: ?Medications - No data to display ? ? ?IMPRESSION / MDM / ASSESSMENT AND PLAN / ED COURSE  ?I reviewed the triage vital signs and the nursing notes. ?             ?               ? ? ?Differential diagnosis includes, but is not limited to, radius fracture, ulnar fracture, metacarpal fracture, humerus fracture, patellar fracture, knee sprain, wrist sprain. ? ?ED Course ?Patient appears well.  Hypertensive at 170/104, consistent with her history of hypertension.  Otherwise normal vitals.  NAD.  She does not request any pain medication at this time ? ?An x-ray shows evidence of intra-articular fracture at the base of the fifth metacarpal.  Knee/humerus/forearm x-rays negative. ? ?Assessment/Plan ?Patient presents with injury sustained from mechanical fall.  She is found to have an intra-articular fracture at the base of the fifth metacarpal,  as well as a small joint effusion in the left knee.  Provided patient a ulnar gutter splint and a knee brace.  Advised her to follow-up with orthopedics.  We will provide her with a prescription for oxycodone/acetaminophen to be used in the short-term.  Patient expressed understanding and agreed with the plan.  We will discharge. ? ?Patient was provided with anticipatory guidance, return precautions, and educational material. Encouraged the patient to return to the emergency department at any time if they begin to experience any new or worsening symptoms.  ? ?  ? ? ?FINAL CLINICAL IMPRESSION(S) / ED DIAGNOSES  ? ?Final diagnoses:  ?Fall, initial encounter  ?Closed nondisplaced fracture of base of fifth metacarpal bone of right hand, initial encounter  ? ? ? ?Rx / DC Orders  ? ?ED Discharge Orders   ? ?      Ordered  ?  oxyCODONE-acetaminophen (PERCOCET/ROXICET) 5-325 MG tablet  Every 6 hours PRN       ? 05/03/21 1127  ? ?  ?  ? ?  ? ? ? ?Note:  This document was  prepared using Dragon voice recognition software and may include unintentional dictation errors. ?  ?Teodoro Spray, [place] ?05/04/21 0845 ? ?  ?Rada Hay, MD ?05/04/21 1622 ? ?

## 2021-05-05 ENCOUNTER — Ambulatory Visit (INDEPENDENT_AMBULATORY_CARE_PROVIDER_SITE_OTHER): Payer: Medicare Other

## 2021-05-05 DIAGNOSIS — C50412 Malignant neoplasm of upper-outer quadrant of left female breast: Secondary | ICD-10-CM

## 2021-05-05 DIAGNOSIS — E1169 Type 2 diabetes mellitus with other specified complication: Secondary | ICD-10-CM

## 2021-05-05 DIAGNOSIS — M25562 Pain in left knee: Secondary | ICD-10-CM

## 2021-05-05 DIAGNOSIS — M1711 Unilateral primary osteoarthritis, right knee: Secondary | ICD-10-CM

## 2021-05-05 DIAGNOSIS — M79641 Pain in right hand: Secondary | ICD-10-CM

## 2021-05-05 DIAGNOSIS — E1121 Type 2 diabetes mellitus with diabetic nephropathy: Secondary | ICD-10-CM

## 2021-05-05 DIAGNOSIS — Z9181 History of falling: Secondary | ICD-10-CM

## 2021-05-05 DIAGNOSIS — I10 Essential (primary) hypertension: Secondary | ICD-10-CM

## 2021-05-05 DIAGNOSIS — E785 Hyperlipidemia, unspecified: Secondary | ICD-10-CM

## 2021-05-05 NOTE — Telephone Encounter (Signed)
Pt is calling back to follow up on request for orthopedic doctor. ? ?Pt is requesting a call back.  ?

## 2021-05-05 NOTE — Telephone Encounter (Signed)
Pt made an additional call regarding having a cast on her arm and the messages below, please advise.  ?

## 2021-05-05 NOTE — Chronic Care Management (AMB) (Signed)
?Chronic Care Management  ? ?CCM RN Visit Note ? ?05/05/2021 ?Name: Myosha Cuadras MRN: [id number] DOB: May 07, 1929 ? ?Subjective: ?Deveney Burgio is a 88 y.o. year old female who is a primary care patient of [name], DO. The care management team was consulted for assistance with disease management and care coordination needs.   ? ?Engaged with patient by telephone for follow up visit in response to provider referral for case management and/or care coordination services.  ? ?Consent to Services:  ?The patient was given information about Chronic Care Management services, agreed to services, and gave verbal consent prior to initiation of services.  Please see initial visit note for detailed documentation.  ? ?Patient agreed to services and verbal consent obtained.  ? ?Assessment: Review of patient past medical history, allergies, medications, health status, including review of consultants reports, laboratory and other test data, was performed as part of comprehensive evaluation and provision of chronic care management services.  ? ?SDOH (Social Determinants of Health) assessments and interventions performed:   ? ?CCM Care Plan ? ?Allergies  ?Allergen Reactions  ? Codeine Nausea Only  ? Morphine And Related Nausea Only  ? ? ?Outpatient Encounter Medications as of 05/05/2021  ?Medication Sig  ? Accu-Chek Softclix Lancets lancets Use to check blood sugar up to twice daily  ? amLODipine (NORVASC) 10 MG tablet Take 1 tablet (10 mg total) by mouth daily.  ? aspirin 81 MG chewable tablet Chew 81 mg by mouth daily.   ? Blood Glucose Monitoring Suppl (ACCU-CHEK GUIDE) w/Device KIT Use as directed to monitor blood sugar  ? cloNIDine (CATAPRES) 0.1 MG tablet TAKE 1 TABLET BY MOUTH DAILY AS NEEDED FOR ELEVATED BLOOD PRESSURE GREATER THAN 180/100/HEADACHE  ? diclofenac Sodium (VOLTAREN) 1 % GEL Apply 2 g topically 4 (four) times daily as needed (arthritis knee, elbow). (Patient not taking: Reported on 03/06/2021)  ?  gabapentin (NEURONTIN) 300 MG capsule TAKE 1 CAPSULE(300 MG) BY MOUTH IN THE MORNING AND AT BEDTIME (Patient not taking: Reported on 03/06/2021)  ? glucose blood (ACCU-CHEK GUIDE) test strip Use to check blood sugar up to twice daily  ? Lancets (ONETOUCH ULTRASOFT) lancets Use to check blood sugar daily, up to 2 times if need  ? lisinopril-hydrochlorothiazide (ZESTORETIC) 20-12.5 MG tablet Take 1 tablet by mouth daily.  ? Multiple Vitamins-Minerals (CERTAVITE SENIOR/ANTIOXIDANT) TABS Take by mouth.  ? oxyCODONE-acetaminophen (PERCOCET/ROXICET) 5-325 MG tablet Take 1 tablet by mouth every 6 (six) hours as needed for up to 7 days for severe pain.  ? pravastatin (PRAVACHOL) 20 MG tablet Take 1 tablet (20 mg total) by mouth at bedtime.  ? ?No facility-administered encounter medications on file as of 05/05/2021.  ? ? ?Patient Active Problem List  ? Diagnosis Date Noted  ? Multinodular goiter 12/25/2019  ? Aortic atherosclerosis ([place]) 12/18/2019  ? Genetic testing 10/06/2018  ? Family history of ovarian cancer   ? Dysuria 09/24/2018  ? Rotator cuff arthropathy of right shoulder 06/16/2018  ? Rotator cuff arthropathy of left shoulder 03/13/2018  ? Chronic pain of both shoulders 03/13/2018  ? Primary cancer of upper outer quadrant of left female breast ([place]) 01/08/2018  ? Type 2 diabetes mellitus with diabetic nephropathy, without long-term current use of insulin ([place]) 11/10/2017  ? Chronic headaches 11/10/2017  ? Hyperlipidemia associated with type 2 diabetes mellitus ([place]) 11/10/2017  ? Essential hypertension 11/10/2017  ? Enteritis 09/18/2017  ? ? ?Conditions to be addressed/monitored:HTN, HLD, DMII, and history of breast cancer, falls and safety, and pain  ? ?  Care Plan : RNCM: General Plan of Care (Adult) For Chronic Disease Management and Care Coordination Needs  ?Updates made by Vanita Ingles, RN since 05/05/2021 12:00 AM  ?  ? ?Problem: RNCM: Development of Plan of Care for Chronic Disease Management (HTN, HLD, DM,  History of breast cancer)   ?Priority: High  ?  ? ?Long-Range Goal: RNCM: Effective Management of Plan of Care for Chronic Disease Management (HTN, HLD, DM, Pain, and falls,  History of breast cancer)   ?Start Date: 03/05/2021  ?Expected End Date: 03/05/2022  ?Note:   ?Current Barriers:  ?Knowledge Deficits related to plan of care for management of HTN, HLD, DMII, Chronic pain, falls and safety concerns and history of breast cancer (left breast with soreness at site of lumpectomy currently)  ?Care Coordination needs related to Limited social support, Social Isolation, and Memory Deficits  ?Chronic Disease Management support and education needs related to HTN, HLD, DMII, and history of breast cancer to left breast with soreness at the site of lumpectomy currently ?Lacks caregiver support.        ? ?RNCM Clinical Goal(s):  ?Patient will verbalize basic understanding of HTN, HLD, DMII, pain and fall and safety concerns and History of breast cancer  disease process and self health management plan as evidenced by keeping appointments, following the plan of care by her providers, calling the office for questions and concerns, and working with the CCM team to effectively manage health and well being  ?take all medications exactly as prescribed and will call provider for medication related questions as evidenced by compliance with medications, working with pharm D for medications/equipment needs and calling for refills before running out of medications    ?attend all scheduled medical appointments: saw pcp on 02-16-2021, follow up with specialist in place as evidenced by keeping appointments and calling for schedule change needs        ?demonstrate improved and ongoing adherence to prescribed treatment plan for HTN, HLD, DMII, and history of breast cancer as evidenced by blood pressure trending downward, lab work stable, blood sugars WNL with the ability to monitor blood sugars consistently, no reoccurrence of breast cancer-  currently the patient is experiencing some soreness to left breast at the site of the lumpectomy ?continue to work with Consulting civil engineer and/or Social Worker to address care management and care coordination needs related to HTN, HLD, DMII, and history of breast cancer as evidenced by adherence to CM Team Scheduled appointments     ?work with pharmacist to address Medication procurement and the need for a functional blood glucose testing device related to HTN, HLD, DMII, and history of breast cancer as evidenced by review of EMR and patient or pharmacist report    ?demonstrate ongoing self health care management ability for effective management of chronic conditions as evidenced by working with the CCM team  through collaboration with Consulting civil engineer, provider, and care team.  ? ?Interventions: ?1:1 collaboration with primary care provider regarding development and update of comprehensive plan of care as evidenced by provider attestation and co-signature ?Inter-disciplinary care team collaboration (see longitudinal plan of care) ?Evaluation of current treatment plan related to  self management and patient's adherence to plan as established by provider ? ? ?Diabetes:  (Status: Goal on Track (progressing): YES.) Long Term Goal  ? ?Lab Results  ?Component Value Date  ? HGBA1C 5.9 (H) 03/24/2021  ?  ?Assessed patient's understanding of A1c goal: <7% ?Provided education to patient about basic DM disease process; ?  Reviewed medications with patient and discussed importance of medication adherence. 03-05-2021: The patient states she is taking medications as prescribed. She is having some affordability concerns. Scheduled to talk to the pharm D tomorrow. Will collaborate with the pharm D concerning expressed needs. 05-05-2021: The patient is actively working with the pharm D.         ?Reviewed prescribed diet with patient heart healthy/ADA diet. 03-05-2021: The patient states that she pretty much eats what she wants to eat. Review  of heart healthy/ADA diet and monitoring for salt, fats, and sugars in her diet. Will send educational material through my chart and the EMMI system; ?Counseled on importance of regular laboratory monitoring as pre

## 2021-05-05 NOTE — Telephone Encounter (Signed)
Copied from [place] [id number]. Topic: Referral - Question ?>> May 05, 2021  9:47 AM Greggory Keen D wrote: ?Reason for CRM: Pt called saying she went to the ER this weekend ,.  She broke a bone in her hand and she has a cast.  She needs to see an orthopedic doctor asap. ?CB@  [phone] ?

## 2021-05-05 NOTE — Patient Instructions (Signed)
Visit Information ? ?Thank you for taking time to visit with me today. Please don't hesitate to contact me if I can be of assistance to you before our next scheduled telephone appointment. ? ?Following are the goals we discussed today:  ?RNCM Clinical Goal(s):  ?Patient will verbalize basic understanding of HTN, HLD, DMII, pain and fall and safety concerns and History of breast cancer  disease process and self health management plan as evidenced by keeping appointments, following the plan of care by her providers, calling the office for questions and concerns, and working with the CCM team to effectively manage health and well being  ?take all medications exactly as prescribed and will call provider for medication related questions as evidenced by compliance with medications, working with pharm D for medications/equipment needs and calling for refills before running out of medications    ?attend all scheduled medical appointments: saw pcp on 02-16-2021, follow up with specialist in place as evidenced by keeping appointments and calling for schedule change needs        ?demonstrate improved and ongoing adherence to prescribed treatment plan for HTN, HLD, DMII, and history of breast cancer as evidenced by blood pressure trending downward, lab work stable, blood sugars WNL with the ability to monitor blood sugars consistently, no reoccurrence of breast cancer- currently the patient is experiencing some soreness to left breast at the site of the lumpectomy ?continue to work with Consulting civil engineer and/or Social Worker to address care management and care coordination needs related to HTN, HLD, DMII, and history of breast cancer as evidenced by adherence to CM Team Scheduled appointments     ?work with pharmacist to address Medication procurement and the need for a functional blood glucose testing device related to HTN, HLD, DMII, and history of breast cancer as evidenced by review of EMR and patient or pharmacist report     ?demonstrate ongoing self health care management ability for effective management of chronic conditions as evidenced by working with the CCM team  through collaboration with Consulting civil engineer, provider, and care team.  ?  ?Interventions: ?1:1 collaboration with primary care provider regarding development and update of comprehensive plan of care as evidenced by provider attestation and co-signature ?Inter-disciplinary care team collaboration (see longitudinal plan of care) ?Evaluation of current treatment plan related to  self management and patient's adherence to plan as established by provider ?  ?  ?Diabetes:  (Status: Goal on Track (progressing): YES.) Long Term Goal  ?  ?     ?Lab Results  ?Component Value Date  ?  HGBA1C 5.9 (H) 03/24/2021  ?  ?Assessed patient's understanding of A1c goal: <7% ?Provided education to patient about basic DM disease process; ?Reviewed medications with patient and discussed importance of medication adherence. 03-05-2021: The patient states she is taking medications as prescribed. She is having some affordability concerns. Scheduled to talk to the pharm D tomorrow. Will collaborate with the pharm D concerning expressed needs. 05-05-2021: The patient is actively working with the pharm D.         ?Reviewed prescribed diet with patient heart healthy/ADA diet. 03-05-2021: The patient states that she pretty much eats what she wants to eat. Review of heart healthy/ADA diet and monitoring for salt, fats, and sugars in her diet. Will send educational material through my chart and the EMMI system; ?Counseled on importance of regular laboratory monitoring as prescribed. 05-05-2021: Patients most recent A1C is WNL. Patient is mindful of watching her blood sugars.  ?Discussed plans  with patient for ongoing care management follow up and provided patient with direct contact information for care management team;      ?Provided patient with written educational materials related to hypo and hyperglycemia and  importance of correct treatment. 03-05-2021: Has not been checking her blood sugars as she does not have a glucose meter that is functional. Discussed options with the patient and she will discuss this with the pharm D on 03-06-2021.  She denies any shaking, confusion, light headedness, dizziness, or sx and sx of hyperglycemia;       ?Reviewed scheduled/upcoming provider appointments including: next pcp appointment on 05-13-2021 at 0840 am    ?Advised patient, providing education and rationale, to check cbg twice daily, when you have symptoms of low or high blood sugar, and before and after exercise and record. 03-05-2021: The patient is open to checking blood sugars and is interested in a new device. Has 2 non-working devices.       ?call provider for findings outside established parameters;       ?Referral made to pharmacy team for assistance with medication reconciliation and help with a glucose meter that her insurance will pay for due to cost constraints and concerns- she currently has 2 devices but neither are operational- she has not been checking her blood sugars;       ?Referral made to social work team for assistance with support and education;      ?Review of patient status, including review of consultants reports, relevant laboratory and other test results, and medications completed;       ?Advised patient to discuss concerns about her DM and effective management of DM with provider;      ?Screening for signs and symptoms of depression related to chronic disease state;        ?Assessed social determinant of health barriers;        ?  ?Hyperlipidemia:  (Status: Goal on Track (progressing): YES.) Long Term Goal  ?     ?Lab Results  ?Component Value Date  ?  CHOL 206 (H) 03/24/2021  ?  HDL 65 03/24/2021  ?  LDLCALC 124 (H) 03/24/2021  ?  TRIG 75 03/24/2021  ?  CHOLHDL 3.2 03/24/2021  ?  ?  ?Medication review performed; medication list updated in electronic medical record. 05-05-2021: The patient is taking pravachol  20 mg daily for cholesterol management  ?Provider established cholesterol goals reviewed; ?Counseled on importance of regular laboratory monitoring as prescribed. 05-05-2021: The patient has had recent lab work. Education and support given ?Provided HLD educational materials; ?Reviewed role and benefits of statin for ASCVD risk reduction; ?Discussed strategies to manage statin-induced myalgias. 03-05-2021: The patient denies any acute findings or issues related to statin use; ?Reviewed importance of limiting foods high in cholesterol. 03-05-2021: The patient eats pretty much what she wants to eat. Discussed dietary restrictions with the patient. She states she eats a lot of croissants. Reviewed foods high in sugars, sweets, and fats. Will sent information by the EMMI system and my chart; ?  ?Hypertension: (Status: Goal on track: NO.) Long Term Goal  ?Last practice recorded BP readings:  ?   ?BP Readings from Last 3 Encounters:  ?05/03/21 (!) 186/73  ?03/24/21 (!) 143/54  ?02/26/21 (!) 196/80  ?Most recent eGFR/CrCl:  ?     ?Lab Results  ?Component Value Date  ?  EGFR 49 (L) 03/24/2021  ?  No components found for: CRCL ?  ?Evaluation of current treatment plan related to hypertension  self management and patient's adherence to plan as established by provider. 03-05-2021: The patient states that her blood pressures were high at the provider office but at home it has been 170/40 and 170/60 and she feels this is good. Extensive education given on the risk of heart attack and stroke with elevated blood pressures. She denies any sx or sx when her blood pressures are elevated. Expressed concern due to elevations and review of medications. The patient endoreses taking medications as ordered. CCM team involvement will hopefully provide additional support and help with management of HTN.    ?Provided education to patient re: stroke prevention, s/s of heart attack and stroke; ?Reviewed prescribed diet heart healthy/ADA diet. 03-05-2021:  The patient admits that she pretty much eats what she wants to eat. Education given. Will send information for healthy eating by mail, by email, by my chart system, and EMMI system. Education on the imp

## 2021-05-05 NOTE — Telephone Encounter (Signed)
Patient seen in ED 2 days ago on 05/03/21 for fall injury and Right hand fracture base of 5th metacarpal. ? ?She was placed in cast and asked to follow-up with [facility]. ? ?Please call her and notify her of [place] office direct # and scheduling, to help her get scheduled for ED Follow-up. ? ?I can place referral if need - but this should be a quicker follow-up because she is an ED follow-up. ? ?Thank you ? ?[name], DO ?[facility] ?[facility] ?05/05/2021, 11:58 AM ? ?

## 2021-05-06 ENCOUNTER — Other Ambulatory Visit: Payer: Self-pay | Admitting: Family Medicine

## 2021-05-06 ENCOUNTER — Ambulatory Visit: Payer: Self-pay

## 2021-05-06 ENCOUNTER — Telehealth: Payer: Self-pay

## 2021-05-06 DIAGNOSIS — S62346A Nondisplaced fracture of base of fifth metacarpal bone, right hand, initial encounter for closed fracture: Secondary | ICD-10-CM

## 2021-05-06 DIAGNOSIS — Z9181 History of falling: Secondary | ICD-10-CM

## 2021-05-06 DIAGNOSIS — M25562 Pain in left knee: Secondary | ICD-10-CM

## 2021-05-06 DIAGNOSIS — I10 Essential (primary) hypertension: Secondary | ICD-10-CM

## 2021-05-06 NOTE — Patient Instructions (Signed)
Visit Information ? ?Thank you for taking time to visit with me today. Please don't hesitate to contact me if I can be of assistance to you before our next scheduled telephone appointment. ? ?Following are the goals we discussed today:  ?RNCM Clinical Goal(s):  ?Patient will verbalize basic understanding of HTN, HLD, DMII, pain and fall and safety concerns and History of breast cancer  disease process and self health management plan as evidenced by keeping appointments, following the plan of care by her providers, calling the office for questions and concerns, and working with the CCM team to effectively manage health and well being  ?take all medications exactly as prescribed and will call provider for medication related questions as evidenced by compliance with medications, working with pharm D for medications/equipment needs and calling for refills before running out of medications    ?attend all scheduled medical appointments: saw pcp on 02-16-2021, follow up with specialist in place as evidenced by keeping appointments and calling for schedule change needs        ?demonstrate improved and ongoing adherence to prescribed treatment plan for HTN, HLD, DMII, and history of breast cancer as evidenced by blood pressure trending downward, lab work stable, blood sugars WNL with the ability to monitor blood sugars consistently, no reoccurrence of breast cancer- currently the patient is experiencing some soreness to left breast at the site of the lumpectomy ?continue to work with Consulting civil engineer and/or Social Worker to address care management and care coordination needs related to HTN, HLD, DMII, and history of breast cancer as evidenced by adherence to CM Team Scheduled appointments     ?work with pharmacist to address Medication procurement and the need for a functional blood glucose testing device related to HTN, HLD, DMII, and history of breast cancer as evidenced by review of EMR and patient or pharmacist report     ?demonstrate ongoing self health care management ability for effective management of chronic conditions as evidenced by working with the CCM team  through collaboration with Consulting civil engineer, provider, and care team.  ?  ?Interventions: ?1:1 collaboration with primary care provider regarding development and update of comprehensive plan of care as evidenced by provider attestation and co-signature ?Inter-disciplinary care team collaboration (see longitudinal plan of care) ?Evaluation of current treatment plan related to  self management and patient's adherence to plan as established by provider ?  ?  ?Diabetes:  (Status: Goal on Track (progressing): YES.) Long Term Goal  ?  ?     ?Lab Results  ?Component Value Date  ?  HGBA1C 5.9 (H) 03/24/2021  ?  ?Assessed patient's understanding of A1c goal: <7% ?Provided education to patient about basic DM disease process; ?Reviewed medications with patient and discussed importance of medication adherence. 03-05-2021: The patient states she is taking medications as prescribed. She is having some affordability concerns. Scheduled to talk to the pharm D tomorrow. Will collaborate with the pharm D concerning expressed needs. 05-05-2021: The patient is actively working with the pharm D.         ?Reviewed prescribed diet with patient heart healthy/ADA diet. 03-05-2021: The patient states that she pretty much eats what she wants to eat. Review of heart healthy/ADA diet and monitoring for salt, fats, and sugars in her diet. Will send educational material through my chart and the EMMI system; ?Counseled on importance of regular laboratory monitoring as prescribed. 05-05-2021: Patients most recent A1C is WNL. Patient is mindful of watching her blood sugars.  ?Discussed plans  with patient for ongoing care management follow up and provided patient with direct contact information for care management team;      ?Provided patient with written educational materials related to hypo and hyperglycemia and  importance of correct treatment. 03-05-2021: Has not been checking her blood sugars as she does not have a glucose meter that is functional. Discussed options with the patient and she will discuss this with the pharm D on 03-06-2021.  She denies any shaking, confusion, light headedness, dizziness, or sx and sx of hyperglycemia;       ?Reviewed scheduled/upcoming provider appointments including: next pcp appointment on 05-13-2021 at 0840 am    ?Advised patient, providing education and rationale, to check cbg twice daily, when you have symptoms of low or high blood sugar, and before and after exercise and record. 03-05-2021: The patient is open to checking blood sugars and is interested in a new device. Has 2 non-working devices.       ?call provider for findings outside established parameters;       ?Referral made to pharmacy team for assistance with medication reconciliation and help with a glucose meter that her insurance will pay for due to cost constraints and concerns- she currently has 2 devices but neither are operational- she has not been checking her blood sugars;       ?Referral made to social work team for assistance with support and education;      ?Review of patient status, including review of consultants reports, relevant laboratory and other test results, and medications completed;       ?Advised patient to discuss concerns about her DM and effective management of DM with provider;      ?Screening for signs and symptoms of depression related to chronic disease state;        ?Assessed social determinant of health barriers;        ?  ?Hyperlipidemia:  (Status: Goal on Track (progressing): YES.) Long Term Goal  ?     ?Lab Results  ?Component Value Date  ?  CHOL 206 (H) 03/24/2021  ?  HDL 65 03/24/2021  ?  LDLCALC 124 (H) 03/24/2021  ?  TRIG 75 03/24/2021  ?  CHOLHDL 3.2 03/24/2021  ?  ?  ?Medication review performed; medication list updated in electronic medical record. 05-05-2021: The patient is taking pravachol  20 mg daily for cholesterol management  ?Provider established cholesterol goals reviewed; ?Counseled on importance of regular laboratory monitoring as prescribed. 05-05-2021: The patient has had recent lab work. Education and support given ?Provided HLD educational materials; ?Reviewed role and benefits of statin for ASCVD risk reduction; ?Discussed strategies to manage statin-induced myalgias. 03-05-2021: The patient denies any acute findings or issues related to statin use; ?Reviewed importance of limiting foods high in cholesterol. 03-05-2021: The patient eats pretty much what she wants to eat. Discussed dietary restrictions with the patient. She states she eats a lot of croissants. Reviewed foods high in sugars, sweets, and fats. Will sent information by the EMMI system and my chart; ?  ?Hypertension: (Status: Goal on track: NO.) Long Term Goal  ?Last practice recorded BP readings:  ?   ?BP Readings from Last 3 Encounters:  ?05/03/21 (!) 186/73  ?03/24/21 (!) 143/54  ?02/26/21 (!) 196/80  ?Most recent eGFR/CrCl:  ?     ?Lab Results  ?Component Value Date  ?  EGFR 49 (L) 03/24/2021  ?  No components found for: CRCL ?  ?Evaluation of current treatment plan related to hypertension  self management and patient's adherence to plan as established by provider. 03-05-2021: The patient states that her blood pressures were high at the provider office but at home it has been 170/40 and 170/60 and she feels this is good. Extensive education given on the risk of heart attack and stroke with elevated blood pressures. She denies any sx or sx when her blood pressures are elevated. Expressed concern due to elevations and review of medications. The patient endoreses taking medications as ordered. CCM team involvement will hopefully provide additional support and help with management of HTN.   05-06-2021: The patient has the additional pain medications and feels like she is better and her blood pressure is not ask high. Discussed getting in  with orthopedic provider sooner. See additional notes.  ?Provided education to patient re: stroke prevention, s/s of heart attack and stroke; ?Reviewed prescribed diet heart healthy/ADA diet. 03-05-2021:

## 2021-05-06 NOTE — Chronic Care Management (AMB) (Signed)
?Chronic Care Management  ? ?CCM RN Visit Note ? ?05/06/2021 ?Name: Elizzie Westergard MRN: [id number] DOB: October 01, 1929 ? ?Subjective: ?Tabbitha Woodrum is a 88 y.o. year old female who is a primary care patient of [name], DO. The care management team was consulted for assistance with disease management and care coordination needs.   ? ?Engaged with patient by telephone for follow up visit in response to provider referral for case management and/or care coordination services.  ? ?Consent to Services:  ?The patient was given information about Chronic Care Management services, agreed to services, and gave verbal consent prior to initiation of services.  Please see initial visit note for detailed documentation.  ? ?Patient agreed to services and verbal consent obtained.  ? ?Assessment: Review of patient past medical history, allergies, medications, health status, including review of consultants reports, laboratory and other test data, was performed as part of comprehensive evaluation and provision of chronic care management services.  ? ?SDOH (Social Determinants of Health) assessments and interventions performed:   ? ?CCM Care Plan ? ?Allergies  ?Allergen Reactions  ? Codeine Nausea Only  ? Morphine And Related Nausea Only  ? ? ?Outpatient Encounter Medications as of 05/06/2021  ?Medication Sig  ? Accu-Chek Softclix Lancets lancets Use to check blood sugar up to twice daily  ? amLODipine (NORVASC) 10 MG tablet Take 1 tablet (10 mg total) by mouth daily.  ? aspirin 81 MG chewable tablet Chew 81 mg by mouth daily.   ? Blood Glucose Monitoring Suppl (ACCU-CHEK GUIDE) w/Device KIT Use as directed to monitor blood sugar  ? cloNIDine (CATAPRES) 0.1 MG tablet TAKE 1 TABLET BY MOUTH DAILY AS NEEDED FOR ELEVATED BLOOD PRESSURE GREATER THAN 180/100/HEADACHE  ? diclofenac Sodium (VOLTAREN) 1 % GEL Apply 2 g topically 4 (four) times daily as needed (arthritis knee, elbow). (Patient not taking: Reported on 03/06/2021)  ?  gabapentin (NEURONTIN) 300 MG capsule TAKE 1 CAPSULE(300 MG) BY MOUTH IN THE MORNING AND AT BEDTIME (Patient not taking: Reported on 03/06/2021)  ? glucose blood (ACCU-CHEK GUIDE) test strip Use to check blood sugar up to twice daily  ? Lancets (ONETOUCH ULTRASOFT) lancets Use to check blood sugar daily, up to 2 times if need  ? lisinopril-hydrochlorothiazide (ZESTORETIC) 20-12.5 MG tablet Take 1 tablet by mouth daily.  ? Multiple Vitamins-Minerals (CERTAVITE SENIOR/ANTIOXIDANT) TABS Take by mouth.  ? oxyCODONE-acetaminophen (PERCOCET/ROXICET) 5-325 MG tablet Take 1 tablet by mouth every 6 (six) hours as needed for up to 7 days for severe pain.  ? pravastatin (PRAVACHOL) 20 MG tablet Take 1 tablet (20 mg total) by mouth at bedtime.  ? ?No facility-administered encounter medications on file as of 05/06/2021.  ? ? ?Patient Active Problem List  ? Diagnosis Date Noted  ? Multinodular goiter 12/25/2019  ? Aortic atherosclerosis ([place]) 12/18/2019  ? Genetic testing 10/06/2018  ? Family history of ovarian cancer   ? Dysuria 09/24/2018  ? Rotator cuff arthropathy of right shoulder 06/16/2018  ? Rotator cuff arthropathy of left shoulder 03/13/2018  ? Chronic pain of both shoulders 03/13/2018  ? Primary cancer of upper outer quadrant of left female breast ([place]) 01/08/2018  ? Type 2 diabetes mellitus with diabetic nephropathy, without long-term current use of insulin ([place]) 11/10/2017  ? Chronic headaches 11/10/2017  ? Hyperlipidemia associated with type 2 diabetes mellitus ([place]) 11/10/2017  ? Essential hypertension 11/10/2017  ? Enteritis 09/18/2017  ? ? ?Conditions to be addressed/monitored:HTN and Falls and chronic pain ? ?Care Plan : RNCM: General Plan of  Care (Adult) For Chronic Disease Management and Care Coordination Needs  ?Updates made by Vanita Ingles, RN since 05/06/2021 12:00 AM  ?  ? ?Problem: RNCM: Development of Plan of Care for Chronic Disease Management (HTN, HLD, DM, History of breast cancer)   ?Priority: High  ?   ? ?Long-Range Goal: RNCM: Effective Management of Plan of Care for Chronic Disease Management (HTN, HLD, DM, Pain, and falls,  History of breast cancer)   ?Start Date: 03/05/2021  ?Expected End Date: 03/05/2022  ?Note:   ?Current Barriers:  ?Knowledge Deficits related to plan of care for management of HTN, HLD, DMII, Chronic pain, falls and safety concerns and history of breast cancer (left breast with soreness at site of lumpectomy currently)  ?Care Coordination needs related to Limited social support, Social Isolation, and Memory Deficits  ?Chronic Disease Management support and education needs related to HTN, HLD, DMII, and history of breast cancer to left breast with soreness at the site of lumpectomy currently ?Lacks caregiver support.        ? ?RNCM Clinical Goal(s):  ?Patient will verbalize basic understanding of HTN, HLD, DMII, pain and fall and safety concerns and History of breast cancer  disease process and self health management plan as evidenced by keeping appointments, following the plan of care by her providers, calling the office for questions and concerns, and working with the CCM team to effectively manage health and well being  ?take all medications exactly as prescribed and will call provider for medication related questions as evidenced by compliance with medications, working with pharm D for medications/equipment needs and calling for refills before running out of medications    ?attend all scheduled medical appointments: saw pcp on 02-16-2021, follow up with specialist in place as evidenced by keeping appointments and calling for schedule change needs        ?demonstrate improved and ongoing adherence to prescribed treatment plan for HTN, HLD, DMII, and history of breast cancer as evidenced by blood pressure trending downward, lab work stable, blood sugars WNL with the ability to monitor blood sugars consistently, no reoccurrence of breast cancer- currently the patient is experiencing some  soreness to left breast at the site of the lumpectomy ?continue to work with Consulting civil engineer and/or Social Worker to address care management and care coordination needs related to HTN, HLD, DMII, and history of breast cancer as evidenced by adherence to CM Team Scheduled appointments     ?work with pharmacist to address Medication procurement and the need for a functional blood glucose testing device related to HTN, HLD, DMII, and history of breast cancer as evidenced by review of EMR and patient or pharmacist report    ?demonstrate ongoing self health care management ability for effective management of chronic conditions as evidenced by working with the CCM team  through collaboration with Consulting civil engineer, provider, and care team.  ? ?Interventions: ?1:1 collaboration with primary care provider regarding development and update of comprehensive plan of care as evidenced by provider attestation and co-signature ?Inter-disciplinary care team collaboration (see longitudinal plan of care) ?Evaluation of current treatment plan related to  self management and patient's adherence to plan as established by provider ? ? ?Diabetes:  (Status: Goal on Track (progressing): YES.) Long Term Goal  ? ?Lab Results  ?Component Value Date  ? HGBA1C 5.9 (H) 03/24/2021  ?  ?Assessed patient's understanding of A1c goal: <7% ?Provided education to patient about basic DM disease process; ?Reviewed medications with patient and discussed importance  of medication adherence. 03-05-2021: The patient states she is taking medications as prescribed. She is having some affordability concerns. Scheduled to talk to the pharm D tomorrow. Will collaborate with the pharm D concerning expressed needs. 05-05-2021: The patient is actively working with the pharm D.         ?Reviewed prescribed diet with patient heart healthy/ADA diet. 03-05-2021: The patient states that she pretty much eats what she wants to eat. Review of heart healthy/ADA diet and monitoring for  salt, fats, and sugars in her diet. Will send educational material through my chart and the EMMI system; ?Counseled on importance of regular laboratory monitoring as prescribed. 05-05-2021: Patients most recent A1C

## 2021-05-06 NOTE — Telephone Encounter (Signed)
?  Care Management  ? ?Follow Up Note ? ? ?05/06/2021 ?Name: Ellen [name] MRN: [id number] DOB: Jun 21, 1929 ? ? ?Referred by: [name], DO ?Reason for referral : Chronic Care Management (RNCM: Follow up form pcp about orthopedics. Chronic Case Management and Care Coordination Needs) ? ? ?Call made back to the patient and the call was completed.  ? ?Follow Up Plan: The care management team will reach out to the patient again over the next 30 days.  ? ?Noreene Larsson RN, MSN, CCM ?Community Care Coordinator ?[place] [facility] ?[facility] ?Mobile: [phone]  ?

## 2021-05-07 NOTE — Telephone Encounter (Signed)
Left a message with number to [facility] [phone] ?

## 2021-05-08 ENCOUNTER — Ambulatory Visit: Payer: Medicare Other | Admitting: Pharmacist

## 2021-05-08 DIAGNOSIS — I10 Essential (primary) hypertension: Secondary | ICD-10-CM

## 2021-05-08 DIAGNOSIS — E1169 Type 2 diabetes mellitus with other specified complication: Secondary | ICD-10-CM

## 2021-05-08 NOTE — Patient Instructions (Signed)
Visit Information ? ?Thank you for taking time to visit with me today. Please don't hesitate to contact me if I can be of assistance to you before our next scheduled telephone appointment. ? ?Following are the goals we discussed today:  ? Goals Addressed   ? ?  ?  ?  ?  ? This Visit's Progress  ?  Pharmacy Goals     ?  Please check your home blood pressure, keep a log of the results and bring this with you to your medical appointments. ? ?Feel free to call me with any questions or concerns. I look forward to our next call! ? ? ? ?Wallace Cullens, PharmD, BCACP, CPP ?Clinical Pharmacist ?[facility] ?[place] ?[phone] ?  ? ?  ? ? ? ?Our next appointment is by telephone on 06/08/2021 at 10:45 AM ? ?Please call the care guide team at [phone] if you need to cancel or reschedule your appointment.  ? ? ?The patient verbalized understanding of instructions, educational materials, and care plan provided today and declined offer to receive copy of patient instructions, educational materials, and care plan.  ? ?

## 2021-05-08 NOTE — Chronic Care Management (AMB) (Signed)
? ?Chronic Care Management ?CCM Pharmacy Note ? ?05/08/2021 ?Name:  Ellen [name] MRN:  [id number] DOB:  1929/08/23 ? ?Subjective: ?Ellen [name] is an 88 y.o. year old female who is a primary patient of [name], DO.  The CCM team was consulted for assistance with disease management and care coordination needs.   ? ?Engaged with patient by telephone for follow up visit for pharmacy case management and/or care coordination services.  ? ?Objective: ? ?Medications Reviewed Today   ? ? Reviewed by Rennis Petty, RPH-CPP (Pharmacist) on 05/08/21 at 1220  Med List Status: <None>  ? ?Medication Order Taking? Sig Documenting Provider Last Dose Status Informant  ?Accu-Chek Softclix Lancets lancets [id number]  Use to check blood sugar up to twice daily [name], DO  Active   ?acetaminophen (TYLENOL) 500 MG tablet [id number] Yes Take 1,000 mg by mouth 2 (two) times daily as needed. [provider] Taking Active   ?amLODipine (NORVASC) 10 MG tablet [id number] Yes Take 1 tablet (10 mg total) by mouth daily. [name], DO Taking Active   ?aspirin 81 MG chewable tablet [id number]  Chew 81 mg by mouth daily.  [provider]  Active Self  ?         ?Med Note [name], [place]   Wed Jun 07, 2018 11:10 AM)    ?Blood Glucose Monitoring Suppl (ACCU-CHEK GUIDE) w/Device KIT [id number]  Use as directed to monitor blood sugar [place] Ranger, Devonne Doughty, DO  Active   ?cloNIDine (CATAPRES) 0.1 MG tablet [id number]  TAKE 1 TABLET BY MOUTH DAILY AS NEEDED FOR ELEVATED BLOOD PRESSURE GREATER THAN 180/100/HEADACHE [name], DO  Active   ?diclofenac Sodium (VOLTAREN) 1 % GEL [id number]  Apply 2 g topically 4 (four) times daily as needed (arthritis knee, elbow).  ?Patient not taking: Reported on 03/06/2021  ? [name], DO  Active   ?gabapentin (NEURONTIN) 300 MG capsule [id number]  TAKE 1 CAPSULE(300 MG) BY MOUTH IN THE MORNING AND AT BEDTIME  ?Patient not  taking: Reported on 03/06/2021  ? [name], DO  Active   ?glucose blood (ACCU-CHEK GUIDE) test strip [id number]  Use to check blood sugar up to twice daily [name], DO  Active   ?Lancets (ONETOUCH ULTRASOFT) lancets [id number]  Use to check blood sugar daily, up to 2 times if need [name], DO  Active   ?lisinopril-hydrochlorothiazide (ZESTORETIC) 20-12.5 MG tablet [id number] Yes Take 1 tablet by mouth daily. [name], DO Taking Active   ?Multiple Vitamins-Minerals (CERTAVITE SENIOR/ANTIOXIDANT) TABS [id number]  Take by mouth. [provider]  Active   ?pravastatin (PRAVACHOL) 20 MG tablet [id number] Yes Take 1 tablet (20 mg total) by mouth at bedtime. [name], DO Taking Active   ? ?  ?  ? ?  ? ? ?Pertinent Labs:  ?Lab Results  ?Component Value Date  ? HGBA1C 5.9 (H) 03/24/2021  ? ?Lab Results  ?Component Value Date  ? CHOL 206 (H) 03/24/2021  ? HDL 65 03/24/2021  ? LDLCALC 124 (H) 03/24/2021  ? TRIG 75 03/24/2021  ? CHOLHDL 3.2 03/24/2021  ? ?Lab Results  ?Component Value Date  ? CREATININE 1.09 (H) 03/24/2021  ? BUN 21 03/24/2021  ? NA 140 03/24/2021  ? K 4.1 03/24/2021  ? CL 103 03/24/2021  ? CO2 29 03/24/2021  ? ?BP Readings from Last 3 Encounters:  ?05/03/21 (!) 186/73  ?03/24/21 (!) 143/54  ?02/26/21 (!) 196/80  ? ?Pulse Readings  from Last 3 Encounters:  ?05/03/21 65  ?03/24/21 (!) 59  ?02/26/21 60  ? ? ? ?SDOH:  (Social Determinants of Health) assessments and interventions performed:  ? ? ?CCM Care Plan ? ?Review of patient past medical history, allergies, medications, health status, including review of consultants reports, laboratory and other test data, was performed as part of comprehensive evaluation and provision of chronic care management services.  ? ?Care Plan : PharmD - HTN, med mgmt  ?Updates made by Rennis Petty, RPH-CPP since 05/08/2021 12:00 AM  ?  ? ?Problem: Disease Progression   ?  ? ?Long-Range Goal:  Disease Progression Prevented or Minimized   ?Start Date: 03/06/2021  ?Expected End Date: 06/04/2021  ?Recent Progress: On track  ?Priority: High  ?Note:   ?Current Barriers:  ?Unable to achieve control of blood pressure  ?Limited social support ? ?Pharmacist Clinical Goal(s):  ?patient will achieve control of blood pressure as evidenced by home and in office blood pressure readings through collaboration with PharmD and provider.  ? ?Interventions: ?1:1 collaboration with [name], DO regarding development and update of comprehensive plan of care as evidenced by provider attestation and co-signature ?Inter-disciplinary care team collaboration (see longitudinal plan of care) ?Perform chart review. Patient seen at [facility] ED on 05/03/2021 related to a fall. Provider advised: ?Control pain with ibuprofen as needed. Utilize oxycodone/acetaminophen sparingly ?New?Medications Started at ED Discharge:?oxycodone-acetaminophen 5-325 MG tablet  Every 6 hours PRN ?Follow-up with the orthopedist listed above, as discussed. Please keep brace on until you can see the orthopedist ?Return to the emergency department anytime if you begin to experience any new or worsening symptoms ?Note appointment with [facility] scheduled for 4/11 ?Reports planning to keep appointment with PCP on 4/12 in order to have provider evaluate a lump in her breast ?Remind patient regarding need to reschedule mammogram. Patient states will call once has addressed orthopedic concern ?Have provided patient with phone number to reschedule: [place] [phone] ?Reports pain currently controlled with acetaminophen 500 mg - 2 tablets (1000 mg) daily as needed ?Reports took one dose of oxycodone-acetaminophen, but this resulted in vomiting, so stopped taking ? ?Medication Adherence: ?Reports started using weekly pillbox to organize her medications ?Denies missed  doses ? ?Hypertension: ?Current treatment:  ?Amlodipine 10 mg daily ?Lisinopril-HCTZ 20-12.5 mg daily ?Clonidine 0.1 mg daily as needed for elevated blood pressure greater than 180/100/headache ?Patient has an upper arm blood pressure monitor ?Reports recent home blood pressure readings:  ?Recalls this morning: 173/64, HR 66 ?Note pain may be impacting recent readings ?Unable to record readings at this time (currently unable to write due to injury to right hand) ?Counsel patient to continue to monitor home blood pressure and to call office for readings outside of established parameters ? ? ?Hyperlipidemia: ?Uncontrolled; current treatment: Pravastatin 20 mg daily ?Have counseled on importance of dietary choices to aid with cholesterol control ?Encouraged patient to review nutrition labels to limit saturated and trans fats ?Encouraged dietary changes including switching to leaner meets and using reduced fat, rather than full fat, dairy products  ? ?Patient Goals/Self-Care Activities ?patient will:  ?- take medications as prescribed as evidenced by patient report and record review ?- check blood pressure, document, and provide at future appointments ?  ?  ? ?Plan: Telephone follow up appointment with care management team member scheduled for:  06/08/2021 at 10:45 AM ? ?Wallace Cullens, PharmD, BCACP, CPP ?Clinical Pharmacist ?[facility] ?[place] ?[phone] ? ? ? ? ? ?

## 2021-05-12 DIAGNOSIS — S62346A Nondisplaced fracture of base of fifth metacarpal bone, right hand, initial encounter for closed fracture: Secondary | ICD-10-CM | POA: Diagnosis not present

## 2021-05-12 DIAGNOSIS — I700 Atherosclerosis of aorta: Secondary | ICD-10-CM | POA: Diagnosis not present

## 2021-05-13 ENCOUNTER — Ambulatory Visit (INDEPENDENT_AMBULATORY_CARE_PROVIDER_SITE_OTHER): Payer: Medicare Other | Admitting: Family Medicine

## 2021-05-13 ENCOUNTER — Ambulatory Visit: Payer: Medicare Other | Admitting: Family Medicine

## 2021-05-13 ENCOUNTER — Encounter: Payer: Self-pay | Admitting: Family Medicine

## 2021-05-13 VITALS — BP 124/70 | HR 65

## 2021-05-13 DIAGNOSIS — C50412 Malignant neoplasm of upper-outer quadrant of left female breast: Secondary | ICD-10-CM | POA: Diagnosis not present

## 2021-05-13 DIAGNOSIS — N6311 Unspecified lump in the right breast, upper outer quadrant: Secondary | ICD-10-CM

## 2021-05-13 NOTE — Progress Notes (Signed)
? ?Subjective:  ? ? Patient ID: Ellen [name], female    DOB: 1929/10/21, 88 y.o.   MRN: [id number] ? ?[name] is a 88 y.o. female presenting on 05/13/2021 for Breast Mass ? ? ?HPI ? ?Right breast mass, new ? ?Left Breast Cancer ?Followed by [facility] ? ?1 -2 weeks ago noticed New R breast mass upper outer, non painful, incidentally found it on her own self exam. No rash or anything on the skin. No swelling. Left breast has known multiple aras of nodularity that are painful. She is currently in surveillance for breast cancer on L side. She had treatment already with XRT in 2020 and did not pursue hormonal therapy. ? ?Asking for mammogram imaging today ? ?Already seen by Ortho for fall / fracture R wrist - yesterday at [facility]. ? ? ?  02/16/2021  ?  9:50 AM 04/23/2019  ?  9:39 AM 09/21/2018  ?  9:19 AM  ?Depression screen PHQ 2/9  ?Decreased Interest 0 0 0  ?Down, Depressed, Hopeless 0 0 0  ?PHQ - 2 Score 0 0 0  ?Altered sleeping 0    ?Tired, decreased energy 0    ?Change in appetite 0    ?Feeling bad or failure about yourself  0    ?Trouble concentrating 0    ?Moving slowly or fidgety/restless 0    ?Suicidal thoughts 0    ?PHQ-9 Score 0    ?Difficult doing work/chores Not difficult at all    ? ? ?Social History  ? ?Tobacco Use  ? Smoking status: Never  ? Smokeless tobacco: Never  ?Vaping Use  ? Vaping Use: Never used  ?Substance Use Topics  ? Alcohol use: Never  ? Drug use: Never  ? ? ?Review of Systems ?Per HPI unless specifically indicated above ? ?   ?Objective:  ?  ?BP 124/70   Pulse 65   SpO2 98%   ?Wt Readings from Last 3 Encounters:  ?05/03/21 134 lb 7.7 oz (61 kg)  ?03/24/21 134 lb 12.8 oz (61.1 kg)  ?02/26/21 139 lb 8 oz (63.3 kg)  ?  ?Physical Exam ?Vitals and nursing note reviewed.  ?Constitutional:   ?   General: She is not in acute distress. ?   Appearance: Normal appearance. She is well-developed. She is not diaphoretic.  ?   Comments: Well-appearing, comfortable, cooperative   ?HENT:  ?   Head: Normocephalic and atraumatic.  ?Eyes:  ?   General:     ?   Right eye: No discharge.     ?   Left eye: No discharge.  ?   Conjunctiva/sclera: Conjunctivae normal.  ?Cardiovascular:  ?   Rate and Rhythm: Normal rate.  ?Pulmonary:  ?   Effort: Pulmonary effort is normal.  ?Chest:  ?   Chest wall: Mass present.  ?Breasts: ?   Right: Mass present.  ?   Comments: R upper outer lateral breast 9 o clock ?Skin: ?   General: Skin is warm and dry.  ?   Findings: No erythema or rash.  ?Neurological:  ?   Mental Status: She is alert and oriented to person, place, and time.  ?Psychiatric:     ?   Mood and Affect: Mood normal.     ?   Behavior: Behavior normal.     ?   Thought Content: Thought content normal.  ?   Comments: Well groomed, good eye contact, normal speech and thoughts  ? ?Results for orders placed or performed in  visit on 03/24/21  ?COMPLETE METABOLIC PANEL WITH GFR  ?Result Value Ref Range  ? Glucose, Bld 92 65 - 99 mg/dL  ? BUN 21 7 - 25 mg/dL  ? Creat 1.09 (H) 0.60 - 0.95 mg/dL  ? eGFR 49 (L) > OR = 60 mL/min/1.43m  ? BUN/Creatinine Ratio 19 6 - 22 (calc)  ? Sodium 140 135 - 146 mmol/L  ? Potassium 4.1 3.5 - 5.3 mmol/L  ? Chloride 103 98 - 110 mmol/L  ? CO2 29 20 - 32 mmol/L  ? Calcium 9.3 8.6 - 10.4 mg/dL  ? Total Protein 7.2 6.1 - 8.1 g/dL  ? Albumin 3.6 3.6 - 5.1 g/dL  ? Globulin 3.6 1.9 - 3.7 g/dL (calc)  ? AG Ratio 1.0 1.0 - 2.5 (calc)  ? Total Bilirubin 0.5 0.2 - 1.2 mg/dL  ? Alkaline phosphatase (APISO) 49 37 - 153 U/L  ? AST 18 10 - 35 U/L  ? ALT 6 6 - 29 U/L  ?Lipid panel  ?Result Value Ref Range  ? Cholesterol 206 (H) <200 mg/dL  ? HDL 65 > OR = 50 mg/dL  ? Triglycerides 75 <150 mg/dL  ? LDL Cholesterol (Calc) 124 (H) mg/dL (calc)  ? Total CHOL/HDL Ratio 3.2 <5.0 (calc)  ? Non-HDL Cholesterol (Calc) 141 (H) <130 mg/dL (calc)  ?Hemoglobin A1c  ?Result Value Ref Range  ? Hgb A1c MFr Bld 5.9 (H) <5.7 % of total Hgb  ? Mean Plasma Glucose 123 mg/dL  ? eAG (mmol/L) 6.8 mmol/L  ?CBC with  Differential/Platelet  ?Result Value Ref Range  ? WBC 5.0 3.8 - 10.8 Thousand/uL  ? RBC 4.22 3.80 - 5.10 Million/uL  ? Hemoglobin 11.3 (L) 11.7 - 15.5 g/dL  ? HCT 35.8 35.0 - 45.0 %  ? MCV 84.8 80.0 - 100.0 fL  ? MCH 26.8 (L) 27.0 - 33.0 pg  ? MCHC 31.6 (L) 32.0 - 36.0 g/dL  ? RDW 14.3 11.0 - 15.0 %  ? Platelets 301 140 - 400 Thousand/uL  ? MPV 9.5 7.5 - 12.5 fL  ? Neutro Abs 1,865 1,500 - 7,800 cells/uL  ? Lymphs Abs 2,520 850 - 3,900 cells/uL  ? Absolute Monocytes 435 200 - 950 cells/uL  ? Eosinophils Absolute 130 15 - 500 cells/uL  ? Basophils Absolute 50 0 - 200 cells/uL  ? Neutrophils Relative % 37.3 %  ? Total Lymphocyte 50.4 %  ? Monocytes Relative 8.7 %  ? Eosinophils Relative 2.6 %  ? Basophils Relative 1.0 %  ? ?   ?Assessment & Plan:  ? ?Problem List Items Addressed This Visit   ? ? Primary cancer of upper outer quadrant of left female breast ([place]  ? ?Other Visit Diagnoses   ? ? Mass of upper outer quadrant of right breast    -  Primary  ? Relevant Orders  ? [place] UN[place] RIGHT INC AXILL[place]  ? MM DIAG BREAST TOMO BILATERAL  ? ?  ? ?New abnormal finding Right breast lateral/upper outer mass identified ?Non tender no pain or other symptoms. ? ?History of LEFT breast cancer existing, already completed radiation therapy 2020, did not pursue chemotherapy or hormonal therapy. ? ?[facility] not have [facility]ogram done 3-4 weeks ago as anticipated due to breast pain. ? ?Ordered new diagnostic mammogram and ultrasound for new R breast mass - Scheduled for 4/19 ? ?Forward chart to Dr FGrayland Ormondfor review. ? ?Orders Placed This Encounter  ?Procedures  ? [place]  UNI RIGHT INC AXILLA  ?  Standing Status:   Future  ?  Standing Expiration Date:   11/12/2021  ?  Order Specific Question:   Reason for Exam (SYMPTOM  OR DIAGNOSIS REQUIRED)  ?  Answer:   Right breast upper outer mass, new finding, history of active breast cancer left breast  ?  Order Specific Question:   Preferred  imaging location?  ?  Answer:   [facility]  ? MM DIAG BREAST TOMO BILATERAL  ?  Standing Status:   Future  ?  Standing Expiration Date:   11/12/2021  ?  Order Specific Question:   Reason for Exam (SYMPTOM  OR DIAGNOSIS REQUIRED)  ?  Answer:   Right breast mass upper outer, new finding, non tender. History of active breast cancer Left breast.  ?  Order Specific Question:   Preferred imaging location?  ?  Answer:   [facility]  ? ? ? ?No orders of the defined types were placed in this encounter. ? ? ? ? ?Follow up plan: ?Return if symptoms worsen or fail to improve. ? ? ? ?[name], DO ?[facility] ?[facility] ?05/13/2021, 10:14 AM ?

## 2021-05-13 NOTE — Patient Instructions (Addendum)
Thank you for coming to the office today. ? ?For Right breast mass. I will also send this to Dr Grayland Ormond and his team they may contact you as well. ? ?For Mammogram diagnostic and ULtrasound  ? ?Call the [place] below anytime to schedule your own appointment now that order has been placed. ? ?[place] ?[facility] ?[address] ?[place], [place] [address] ?Phone: [phone] ? ? ?Please schedule a Follow-up Appointment to: Return if symptoms worsen or fail to improve. ? ?If you have any other questions or concerns, please feel free to call the office or send a message through [place]. You may also schedule an earlier appointment if necessary. ? ?Additionally, you may be receiving a survey about your experience at our office within a few days to 1 week by e-mail or mail. We value your feedback. ? ?[name], DO ?[place] ?

## 2021-05-17 IMAGING — CR DG KNEE COMPLETE 4+V*R*
1 series · 4 of 4 positions shown · non-contrast
Comparison: None.

CLINICAL DATA: Fall, right knee pain

EXAM:
RIGHT KNEE - COMPLETE 4+ VIEW

[Series 1: dg knee complete 4 views right · 0.14mm/px · 4 of 4 slices shown]
[im 1/4]
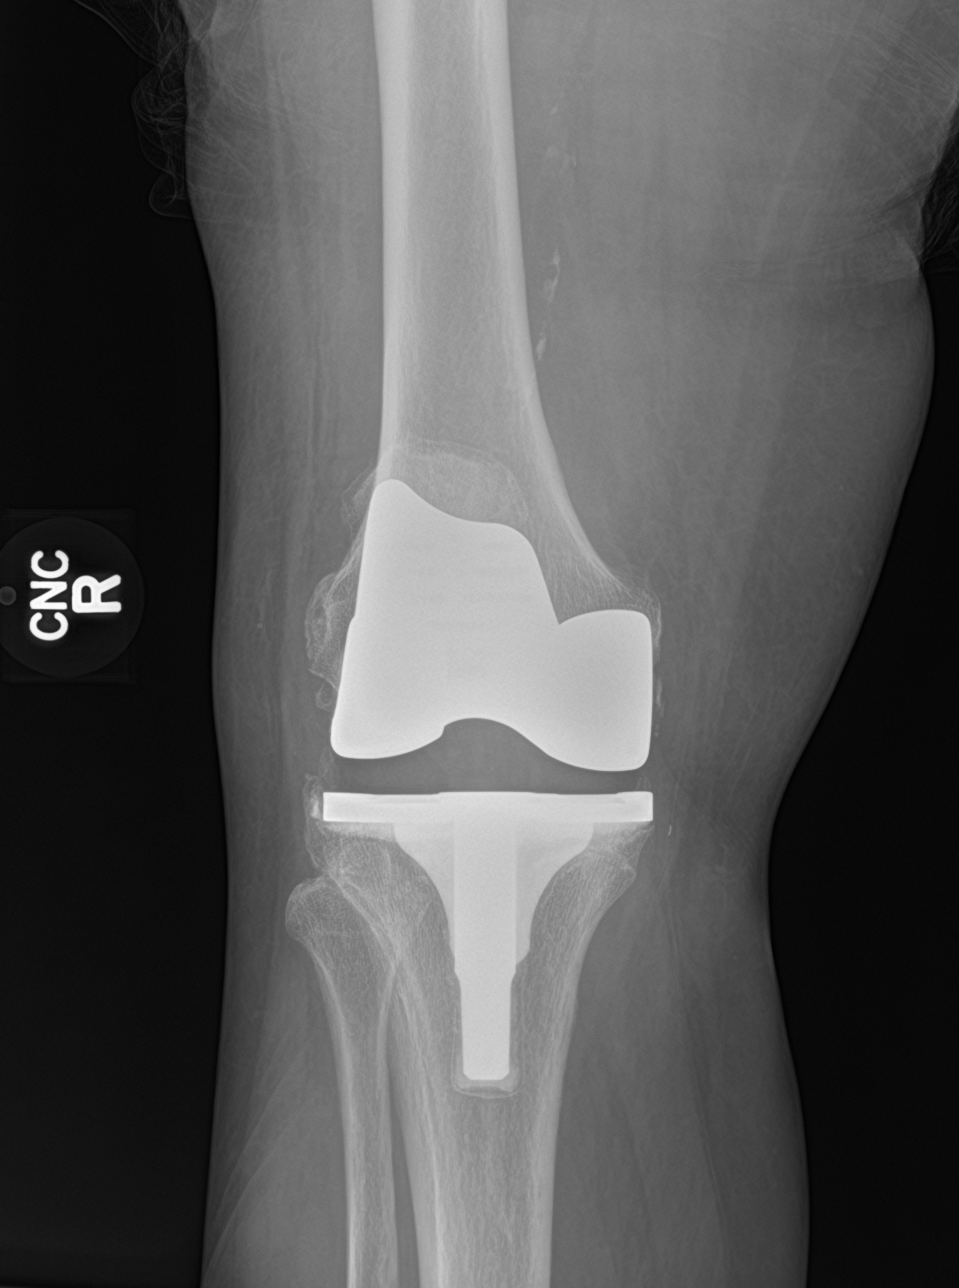
[im 2/4]
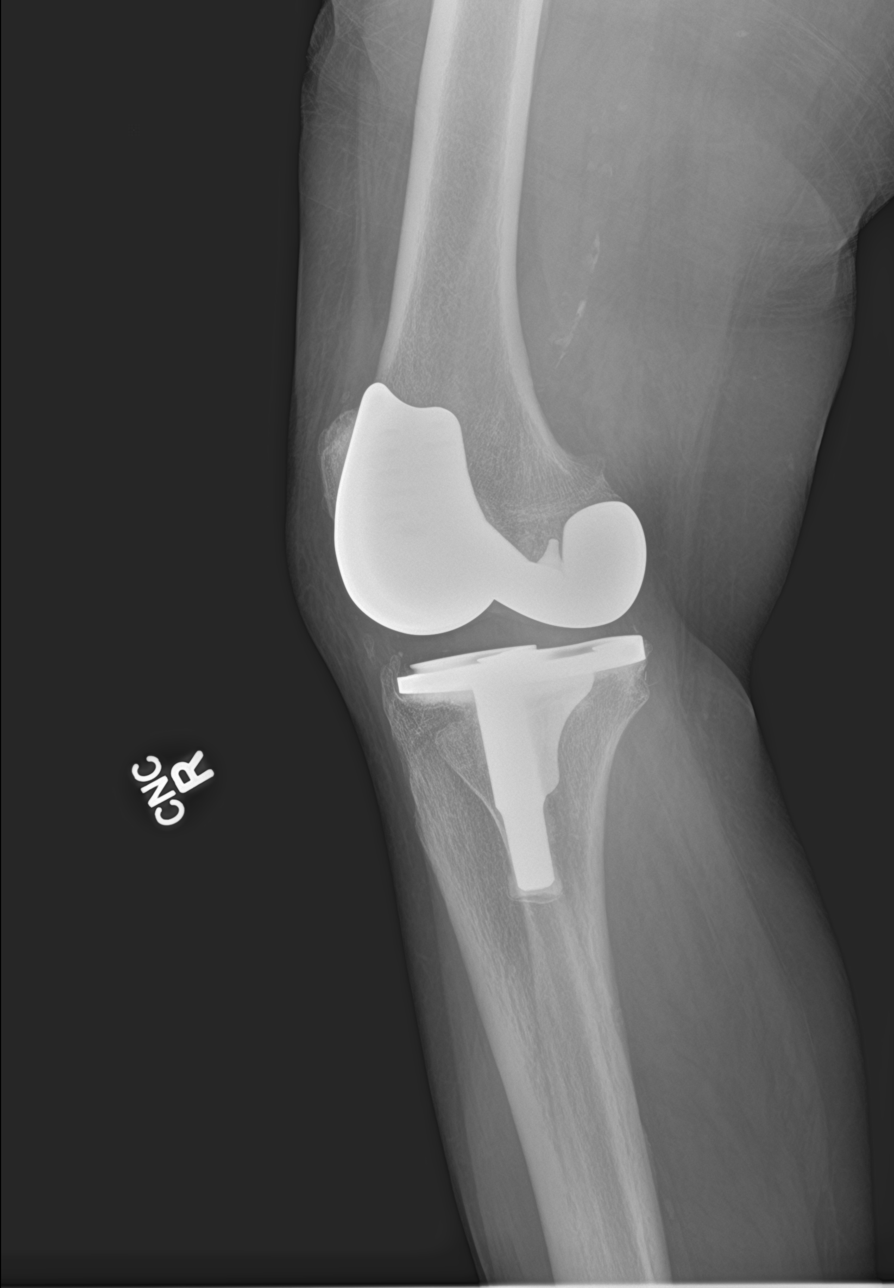
[im 3/4]
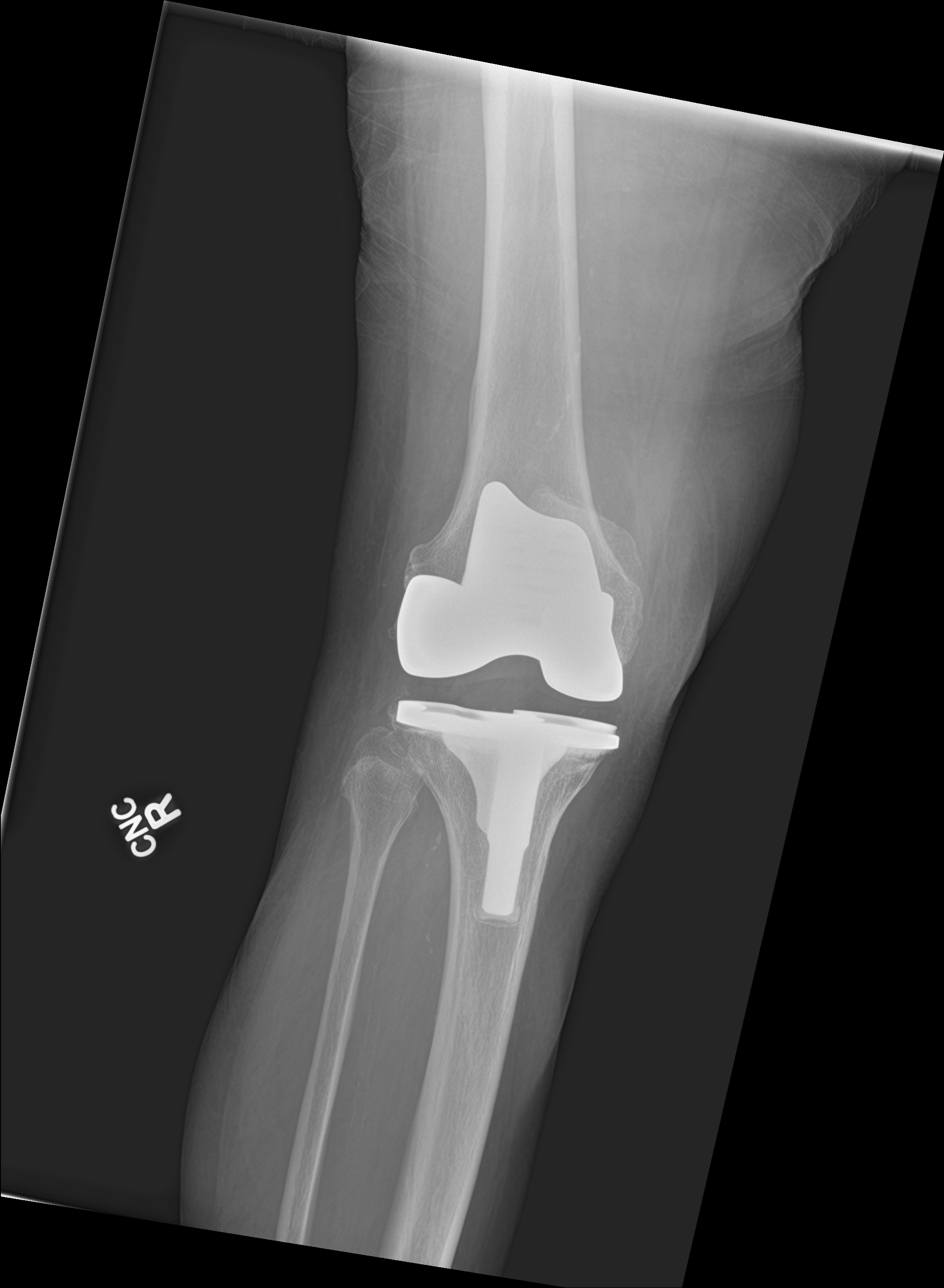
[im 4/4]
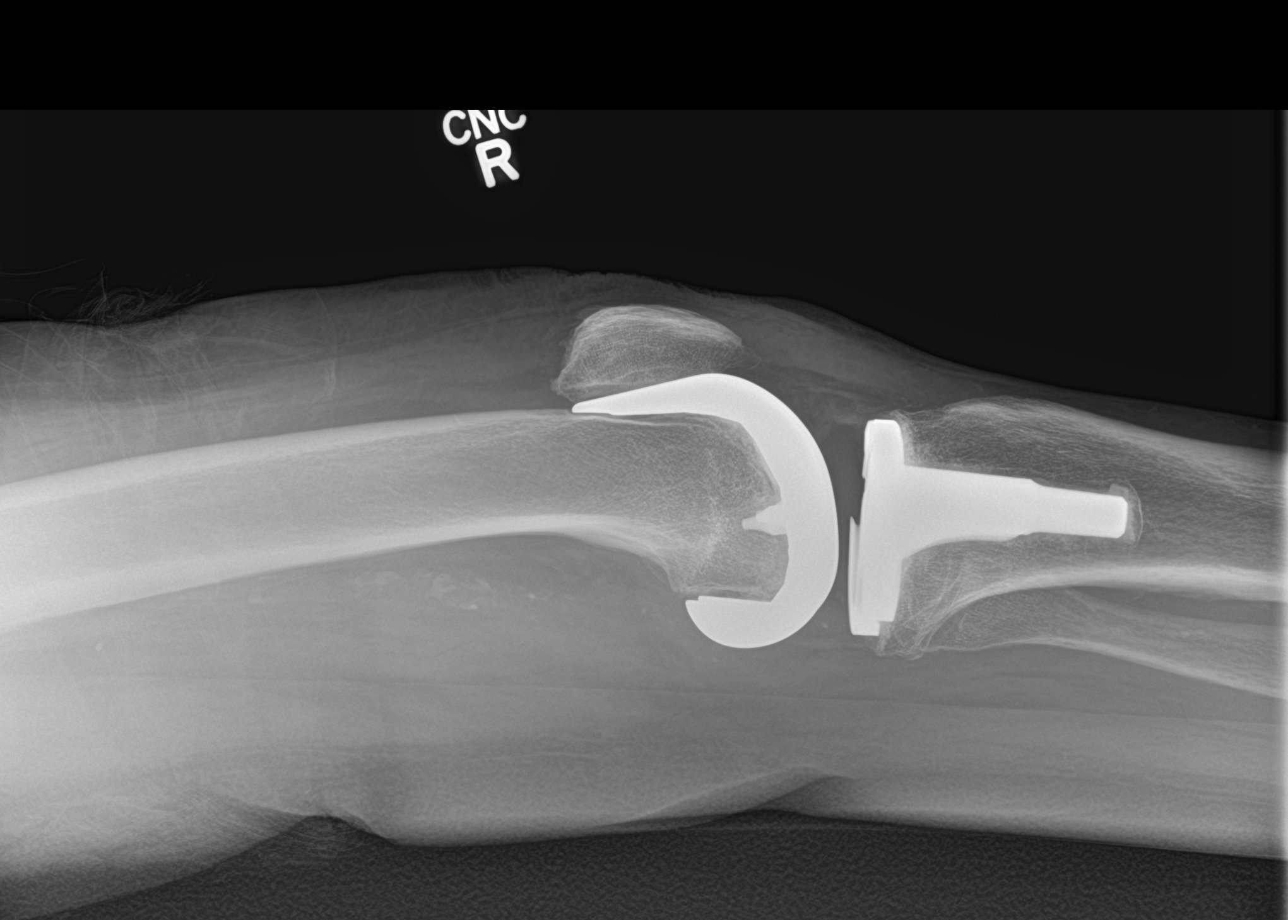

[4 of 4 positions shown; findings below may reference images not displayed]

FINDINGS: Surgical changes of prior total knee arthroplasty. No evidence of
hardware complication or periprosthetic fracture. No knee joint
effusion. Atherosclerotic calcifications visualized in the distal
superficial femoral artery.
IMPRESSION: Total knee arthroplasty without evidence of hardware complication,
fracture or malalignment.

Atherosclerotic peripheral vascular disease.

## 2021-05-18 ENCOUNTER — Telehealth: Payer: Medicare Other

## 2021-05-18 ENCOUNTER — Telehealth: Payer: Self-pay

## 2021-05-18 NOTE — Telephone Encounter (Signed)
?  Care Management  ? ?Follow Up Note ? ? ?05/18/2021 ?Name: Ellen [name] MRN: [id number] DOB: 1929-05-08 ? ? ?Referred by: [name], DO ?Reason for referral : Chronic Care Management (RNCM: Follow up for Chronic Disease Management and Care Coordination Needs) ? ? ?An unsuccessful telephone outreach was attempted today. The patient was referred to the case management team for assistance with care management and care coordination.  ? ?Follow Up Plan: A HIPPA compliant phone message was left for the patient providing contact information and requesting a return call.  ?Noreene Larsson RN, MSN, CCM ?Community Care Coordinator ?[facility] [facility] ?[facility] ?Mobile: [phone]  ?

## 2021-05-20 ENCOUNTER — Ambulatory Visit
Admission: RE | Admit: 2021-05-20 | Discharge: 2021-05-20 | Disposition: A | Discharge status: Home / Self Care | Payer: Medicare Other | Source: Ambulatory Visit | Attending: Family Medicine | Admitting: Family Medicine

## 2021-05-20 DIAGNOSIS — N6311 Unspecified lump in the right breast, upper outer quadrant: Secondary | ICD-10-CM | POA: Diagnosis not present

## 2021-05-20 DIAGNOSIS — Z853 Personal history of malignant neoplasm of breast: Secondary | ICD-10-CM | POA: Diagnosis not present

## 2021-05-21 ENCOUNTER — Telehealth: Payer: Medicare Other

## 2021-05-21 ENCOUNTER — Ambulatory Visit: Payer: Self-pay

## 2021-05-21 DIAGNOSIS — Z9181 History of falling: Secondary | ICD-10-CM

## 2021-05-21 DIAGNOSIS — M1711 Unilateral primary osteoarthritis, right knee: Secondary | ICD-10-CM

## 2021-05-21 DIAGNOSIS — S62346A Nondisplaced fracture of base of fifth metacarpal bone, right hand, initial encounter for closed fracture: Secondary | ICD-10-CM

## 2021-05-21 DIAGNOSIS — E1121 Type 2 diabetes mellitus with diabetic nephropathy: Secondary | ICD-10-CM

## 2021-05-21 DIAGNOSIS — I10 Essential (primary) hypertension: Secondary | ICD-10-CM

## 2021-05-21 DIAGNOSIS — M25562 Pain in left knee: Secondary | ICD-10-CM

## 2021-05-21 DIAGNOSIS — G8929 Other chronic pain: Secondary | ICD-10-CM

## 2021-05-21 DIAGNOSIS — N6311 Unspecified lump in the right breast, upper outer quadrant: Secondary | ICD-10-CM

## 2021-05-21 DIAGNOSIS — E1169 Type 2 diabetes mellitus with other specified complication: Secondary | ICD-10-CM

## 2021-05-21 NOTE — Chronic Care Management (AMB) (Signed)
?Chronic Care Management  ? ?CCM RN Visit Note ? ?05/21/2021 ?Name: Ellen [name] MRN: [id number] DOB: 08-23-1929 ? ?Subjective: ?Ellen [name] is a 88 y.o. year old female who is a primary care patient of [name], DO. The care management team was consulted for assistance with disease management and care coordination needs.   ? ?Engaged with patient by telephone for follow up visit in response to provider referral for case management and/or care coordination services.  ? ?Consent to Services:  ?The patient was given information about Chronic Care Management services, agreed to services, and gave verbal consent prior to initiation of services.  Please see initial visit note for detailed documentation.  ? ?Patient agreed to services and verbal consent obtained.  ? ?Assessment: Review of patient past medical history, allergies, medications, health status, including review of consultants reports, laboratory and other test data, was performed as part of comprehensive evaluation and provision of chronic care management services.  ? ?SDOH (Social Determinants of Health) assessments and interventions performed:   ? ?CCM Care Plan ? ?Allergies  ?Allergen Reactions  ? Codeine Nausea Only  ? Morphine And Related Nausea Only  ? ? ?Outpatient Encounter Medications as of 05/21/2021  ?Medication Sig  ? Accu-Chek Softclix Lancets lancets Use to check blood sugar up to twice daily  ? acetaminophen (TYLENOL) 500 MG tablet Take 1,000 mg by mouth 2 (two) times daily as needed.  ? amLODipine (NORVASC) 10 MG tablet Take 1 tablet (10 mg total) by mouth daily.  ? aspirin 81 MG chewable tablet Chew 81 mg by mouth daily.   ? Blood Glucose Monitoring Suppl (ACCU-CHEK GUIDE) w/Device KIT Use as directed to monitor blood sugar  ? cloNIDine (CATAPRES) 0.1 MG tablet TAKE 1 TABLET BY MOUTH DAILY AS NEEDED FOR ELEVATED BLOOD PRESSURE GREATER THAN 180/100/HEADACHE  ? diclofenac Sodium (VOLTAREN) 1 % GEL Apply 2 g topically 4 (four)  times daily as needed (arthritis knee, elbow). (Patient not taking: Reported on 03/06/2021)  ? gabapentin (NEURONTIN) 300 MG capsule TAKE 1 CAPSULE(300 MG) BY MOUTH IN THE MORNING AND AT BEDTIME (Patient not taking: Reported on 03/06/2021)  ? glucose blood (ACCU-CHEK GUIDE) test strip Use to check blood sugar up to twice daily  ? Lancets (ONETOUCH ULTRASOFT) lancets Use to check blood sugar daily, up to 2 times if need  ? lisinopril-hydrochlorothiazide (ZESTORETIC) 20-12.5 MG tablet Take 1 tablet by mouth daily.  ? Multiple Vitamins-Minerals (CERTAVITE SENIOR/ANTIOXIDANT) TABS Take by mouth.  ? pravastatin (PRAVACHOL) 20 MG tablet Take 1 tablet (20 mg total) by mouth at bedtime.  ? ?No facility-administered encounter medications on file as of 05/21/2021.  ? ? ?Patient Active Problem List  ? Diagnosis Date Noted  ? Multinodular goiter 12/25/2019  ? Aortic atherosclerosis ([place]) 12/18/2019  ? Genetic testing 10/06/2018  ? Family history of ovarian cancer   ? Dysuria 09/24/2018  ? Rotator cuff arthropathy of right shoulder 06/16/2018  ? Rotator cuff arthropathy of left shoulder 03/13/2018  ? Chronic pain of both shoulders 03/13/2018  ? Primary cancer of upper outer quadrant of left female breast ([place]) 01/08/2018  ? Type 2 diabetes mellitus with diabetic nephropathy, without long-term current use of insulin ([place]) 11/10/2017  ? Chronic headaches 11/10/2017  ? Hyperlipidemia associated with type 2 diabetes mellitus ([place]) 11/10/2017  ? Essential hypertension 11/10/2017  ? Enteritis 09/18/2017  ? ? ?Conditions to be addressed/monitored:HTN, HLD, DMII, and history of breast cancer ? ?Care Plan : RNCM: General Plan of Care (Adult) For Chronic Disease Management  and Care Coordination Needs  ?Updates made by Vanita Ingles, RN since 05/21/2021 12:00 AM  ?  ? ?Problem: RNCM: Development of Plan of Care for Chronic Disease Management (HTN, HLD, DM, History of breast cancer)   ?Priority: High  ?  ? ?Long-Range Goal: RNCM: Effective  Management of Plan of Care for Chronic Disease Management (HTN, HLD, DM, Pain, and falls,  History of breast cancer)   ?Start Date: 03/05/2021  ?Expected End Date: 03/05/2022  ?Note:   ?Current Barriers:  ?Knowledge Deficits related to plan of care for management of HTN, HLD, DMII, Chronic pain, falls and safety concerns and history of breast cancer (left breast with soreness at site of lumpectomy currently)  ?Care Coordination needs related to Limited social support, Social Isolation, and Memory Deficits  ?Chronic Disease Management support and education needs related to HTN, HLD, DMII, and history of breast cancer to left breast with soreness at the site of lumpectomy currently ?Lacks caregiver support.        ? ?RNCM Clinical Goal(s):  ?Patient will verbalize basic understanding of HTN, HLD, DMII, pain and fall and safety concerns and History of breast cancer  disease process and self health management plan as evidenced by keeping appointments, following the plan of care by her providers, calling the office for questions and concerns, and working with the CCM team to effectively manage health and well being  ?take all medications exactly as prescribed and will call provider for medication related questions as evidenced by compliance with medications, working with pharm D for medications/equipment needs and calling for refills before running out of medications    ?attend all scheduled medical appointments: saw pcp on 05-13-2021, follow up with specialist in place as evidenced by keeping appointments and calling for schedule change needs        ?demonstrate improved and ongoing adherence to prescribed treatment plan for HTN, HLD, DMII, and history of breast cancer as evidenced by blood pressure trending downward, lab work stable, blood sugars WNL with the ability to monitor blood sugars consistently, no reoccurrence of breast cancer- currently the patient is experiencing some soreness to left breast at the site of the  lumpectomy ?continue to work with Consulting civil engineer and/or Social Worker to address care management and care coordination needs related to HTN, HLD, DMII, and history of breast cancer as evidenced by adherence to CM Team Scheduled appointments     ?work with pharmacist to address Medication procurement and the need for a functional blood glucose testing device related to HTN, HLD, DMII, and history of breast cancer as evidenced by review of EMR and patient or pharmacist report    ?demonstrate ongoing self health care management ability for effective management of chronic conditions as evidenced by working with the CCM team  through collaboration with Consulting civil engineer, provider, and care team.  ? ?Interventions: ?1:1 collaboration with primary care provider regarding development and update of comprehensive plan of care as evidenced by provider attestation and co-signature ?Inter-disciplinary care team collaboration (see longitudinal plan of care) ?Evaluation of current treatment plan related to  self management and patient's adherence to plan as established by provider ? ? ?Diabetes:  (Status: Goal on Track (progressing): YES.) Long Term Goal  ? ?Lab Results  ?Component Value Date  ? HGBA1C 5.9 (H) 03/24/2021  ?  ?Assessed patient's understanding of A1c goal: <7%. 05-21-2021: The patient knows goals and is well below goal. ?Provided education to patient about basic DM disease process. 05-21-2021: Knows about  her DM and how to effectively manage; ?Reviewed medications with patient and discussed importance of medication adherence. 03-05-2021: The patient states she is taking medications as prescribed. She is having some affordability concerns. Scheduled to talk to the pharm D tomorrow. Will collaborate with the pharm D concerning expressed needs. 05-21-2021: The patient is actively working with the pharm D.         ?Reviewed prescribed diet with patient heart healthy/ADA diet. 03-05-2021: The patient states that she pretty much  eats what she wants to eat. Review of heart healthy/ADA diet and monitoring for salt, fats, and sugars in her diet. Will send educational material through my chart and the EMMI system. 05-21-2021: The patien

## 2021-05-21 NOTE — Patient Instructions (Signed)
Visit Information ? ?Thank you for taking time to visit with me today. Please don't hesitate to contact me if I can be of assistance to you before our next scheduled telephone appointment. ? ?Following are the goals we discussed today:  ?RNCM Clinical Goal(s):  ?Patient will verbalize basic understanding of HTN, HLD, DMII, pain and fall and safety concerns and History of breast cancer  disease process and self health management plan as evidenced by keeping appointments, following the plan of care by her providers, calling the office for questions and concerns, and working with the CCM team to effectively manage health and well being  ?take all medications exactly as prescribed and will call provider for medication related questions as evidenced by compliance with medications, working with pharm D for medications/equipment needs and calling for refills before running out of medications    ?attend all scheduled medical appointments: saw pcp on 05-13-2021, follow up with specialist in place as evidenced by keeping appointments and calling for schedule change needs        ?demonstrate improved and ongoing adherence to prescribed treatment plan for HTN, HLD, DMII, and history of breast cancer as evidenced by blood pressure trending downward, lab work stable, blood sugars WNL with the ability to monitor blood sugars consistently, no reoccurrence of breast cancer- currently the patient is experiencing some soreness to left breast at the site of the lumpectomy ?continue to work with Consulting civil engineer and/or Social Worker to address care management and care coordination needs related to HTN, HLD, DMII, and history of breast cancer as evidenced by adherence to CM Team Scheduled appointments     ?work with pharmacist to address Medication procurement and the need for a functional blood glucose testing device related to HTN, HLD, DMII, and history of breast cancer as evidenced by review of EMR and patient or pharmacist report     ?demonstrate ongoing self health care management ability for effective management of chronic conditions as evidenced by working with the CCM team  through collaboration with Consulting civil engineer, provider, and care team.  ?  ?Interventions: ?1:1 collaboration with primary care provider regarding development and update of comprehensive plan of care as evidenced by provider attestation and co-signature ?Inter-disciplinary care team collaboration (see longitudinal plan of care) ?Evaluation of current treatment plan related to  self management and patient's adherence to plan as established by provider ?  ?  ?Diabetes:  (Status: Goal on Track (progressing): YES.) Long Term Goal  ?  ?     ?Lab Results  ?Component Value Date  ?  HGBA1C 5.9 (H) 03/24/2021  ?  ?Assessed patient's understanding of A1c goal: <7%. 05-21-2021: The patient knows goals and is well below goal. ?Provided education to patient about basic DM disease process. 05-21-2021: Knows about her DM and how to effectively manage; ?Reviewed medications with patient and discussed importance of medication adherence. 03-05-2021: The patient states she is taking medications as prescribed. She is having some affordability concerns. Scheduled to talk to the pharm D tomorrow. Will collaborate with the pharm D concerning expressed needs. 05-21-2021: The patient is actively working with the pharm D.         ?Reviewed prescribed diet with patient heart healthy/ADA diet. 03-05-2021: The patient states that she pretty much eats what she wants to eat. Review of heart healthy/ADA diet and monitoring for salt, fats, and sugars in her diet. Will send educational material through my chart and the EMMI system. 05-21-2021: The patient is compliant with heart  healthy/ADA diet; ?Counseled on importance of regular laboratory monitoring as prescribed. 05-21-2021: Patients most recent A1C is WNL. Patient is mindful of watching her blood sugars.  ?Discussed plans with patient for ongoing care  management follow up and provided patient with direct contact information for care management team;      ?Provided patient with written educational materials related to hypo and hyperglycemia and importance of correct treatment. 03-05-2021: Has not been checking her blood sugars as she does not have a glucose meter that is functional. Discussed options with the patient and she will discuss this with the pharm D on 03-06-2021.  She denies any shaking, confusion, light headedness, dizziness, or sx and sx of hyperglycemia. 05-21-2021: The patient knows how to effectively manage blood sugars;       ?Reviewed scheduled/upcoming provider appointments including: next pcp appointment on 07-22-2021 at 0940 am    ?Advised patient, providing education and rationale, to check cbg twice daily, when you have symptoms of low or high blood sugar, and before and after exercise and record. 03-05-2021: The patient is open to checking blood sugars and is interested in a new device. Has 2 non-working devices.       ?call provider for findings outside established parameters;       ?Referral made to pharmacy team for assistance with medication reconciliation and help with a glucose meter that her insurance will pay for due to cost constraints and concerns- she currently has 2 devices but neither are operational- she has not been checking her blood sugars;       ?Referral made to social work team for assistance with support and education;      ?Review of patient status, including review of consultants reports, relevant laboratory and other test results, and medications completed;       ?Advised patient to discuss concerns about her DM and effective management of DM with provider;      ?Screening for signs and symptoms of depression related to chronic disease state;        ?Assessed social determinant of health barriers;        ?  ?Hyperlipidemia:  (Status: Goal on Track (progressing): YES.) Long Term Goal  ?     ?Lab Results  ?Component Value Date   ?  CHOL 206 (H) 03/24/2021  ?  HDL 65 03/24/2021  ?  LDLCALC 124 (H) 03/24/2021  ?  TRIG 75 03/24/2021  ?  CHOLHDL 3.2 03/24/2021  ?  ?  ?Medication review performed; medication list updated in electronic medical record. 05-21-2021: The patient is taking pravachol 20 mg daily for cholesterol management  ?Provider established cholesterol goals reviewed; ?Counseled on importance of regular laboratory monitoring as prescribed. 05-21-2021: The patient has had recent lab work. Education and support given ?Provided HLD educational materials; ?Reviewed role and benefits of statin for ASCVD risk reduction; ?Discussed strategies to manage statin-induced myalgias. 05-21-2021: The patient denies any acute findings or issues related to statin use; ?Reviewed importance of limiting foods high in cholesterol. 03-05-2021: The patient eats pretty much what she wants to eat. Discussed dietary restrictions with the patient. She states she eats a lot of croissants. Reviewed foods high in sugars, sweets, and fats. Will sent information by the EMMI system and my chart; ?  ?Hypertension: (Status: Goal on track: NO.) Long Term Goal  ?Last practice recorded BP readings:  ?   ?BP Readings from Last 3 Encounters:  ?05/13/21 124/70  ?05/03/21 (!) 186/73  ?03/24/21 (!) 143/54  ?Most recent  eGFR/CrCl:  ?     ?Lab Results  ?Component Value Date  ?  EGFR 49 (L) 03/24/2021  ?  No components found for: CRCL ?  ?Evaluation of current treatment plan related to hypertension self management and patient's adherence to plan as established by provider. 03-05-2021: The patient states that her blood pressures were high at the provider office but at home it has been 170/40 and 170/60 and she feels this is good. Extensive education given on the risk of heart attack and stroke with elevated blood pressures. She denies any sx or sx when her blood pressures are elevated. Expressed concern due to elevations and review of medications. The patient endoreses taking  medications as ordered. CCM team involvement will hopefully provide additional support and help with management of HTN.   05-06-2021: The patient has the additional pain medications and feels like she is better and he

## 2021-05-31 DIAGNOSIS — I10 Essential (primary) hypertension: Secondary | ICD-10-CM | POA: Diagnosis not present

## 2021-05-31 DIAGNOSIS — E785 Hyperlipidemia, unspecified: Secondary | ICD-10-CM

## 2021-05-31 DIAGNOSIS — C50412 Malignant neoplasm of upper-outer quadrant of left female breast: Secondary | ICD-10-CM | POA: Diagnosis not present

## 2021-05-31 DIAGNOSIS — E1121 Type 2 diabetes mellitus with diabetic nephropathy: Secondary | ICD-10-CM

## 2021-05-31 DIAGNOSIS — E1169 Type 2 diabetes mellitus with other specified complication: Secondary | ICD-10-CM | POA: Diagnosis not present

## 2021-05-31 DIAGNOSIS — M1711 Unilateral primary osteoarthritis, right knee: Secondary | ICD-10-CM | POA: Diagnosis not present

## 2021-06-08 ENCOUNTER — Telehealth: Payer: Self-pay | Admitting: Pharmacist

## 2021-06-08 ENCOUNTER — Telehealth: Payer: Medicare Other

## 2021-06-08 NOTE — Telephone Encounter (Signed)
?  Chronic Care Management  ? ?Outreach Note ? ?06/08/2021 ?Name: [name] MRN: [id number] DOB: [birth year] ? ?Referred by: [name], DO ?Reason for referral : No chief complaint on file. ? ? ?Was unable to reach patient via telephone today and have left HIPAA compliant voicemail asking patient to return my call.  ? ? ?Follow Up Plan: CM Pharmacist will attempt to reach patient by telephone again in the next 14 days ? ?Wallace Cullens, PharmD, BCACP ?Clinical Pharmacist ?[facility] ?[phone] ? ?

## 2021-06-16 ENCOUNTER — Ambulatory Visit (INDEPENDENT_AMBULATORY_CARE_PROVIDER_SITE_OTHER): Payer: Medicare Other | Admitting: Licensed Clinical Social Worker

## 2021-06-16 DIAGNOSIS — G8929 Other chronic pain: Secondary | ICD-10-CM

## 2021-06-16 DIAGNOSIS — E1121 Type 2 diabetes mellitus with diabetic nephropathy: Secondary | ICD-10-CM

## 2021-06-16 NOTE — Chronic Care Management (AMB) (Signed)
? ?   Clinical Social Work  ?Care Management  ? Phone Outreach  ? ? ?06/16/2021 ?Name: Harshitha Fretz MRN: [id number] DOB: 1929/06/22 ? ?Garry Purdie is a 88 y.o. year old female who is a primary care patient of [name], DO .  ? ?Reason for referral: Mental Health Counseling and Resources.   ? ?CCM LCSW reached out to patient today by phone to introduce self, assess needs and offer Care Management services and interventions. Patient was unable to keep phone appointment today and requested to reschedule noting the recent passing of oldest grandson ? ?Plan:Appointment was rescheduled with CCM LCSW  ? ?Review of patient status, including review of consultants reports, relevant laboratory and other test results, and collaboration with appropriate care team members and the patient's provider was performed as part of comprehensive patient evaluation and provision of care management services.   ? ?Christa See, MSW, LCSW ?[facility] ?[facility] ?[email or web address] ?Phone [phone] ?11:15 AM ? ? ? ?  ? ? ? ? ?

## 2021-06-17 ENCOUNTER — Telehealth: Payer: Medicare Other

## 2021-06-19 ENCOUNTER — Ambulatory Visit: Payer: Medicare Other | Admitting: Pharmacist

## 2021-06-19 DIAGNOSIS — I10 Essential (primary) hypertension: Secondary | ICD-10-CM

## 2021-06-19 NOTE — Patient Instructions (Signed)
Visit Information  Thank you for taking time to visit with me today. Please don't hesitate to contact me if I can be of assistance to you before our next scheduled telephone appointment.  Following are the goals we discussed today:   Goals Addressed             This Visit's Progress    Pharmacy Goals       Please check your home blood pressure, keep a log of the results and bring this with you to your medical appointments.  Feel free to call me with any questions or concerns. I look forward to our next call!   Wallace Cullens, PharmD, Para [place], CPP Clinical Pharmacist [facility] [phone]         Please call the care guide team at [phone] if you need to cancel or reschedule your appointment.    Patient verbalizes understanding of instructions and care plan provided today and agrees to view in [place]. Active MyChart status and patient understanding of how to access instructions and care plan via MyChart confirmed with patient.

## 2021-06-19 NOTE — Chronic Care Management (AMB) (Signed)
Chronic Care Management CCM Pharmacy Note  06/19/2021 Name:  Ellen [name] MRN:  [id number] DOB:  Mar 25, 1929  Subjective: Ellen [name] is an 88 y.o. year old female who is a primary patient of [name], DO.  The CCM team was consulted for assistance with disease management and care coordination needs.    Engaged with patient by telephone for follow up visit for pharmacy case management and/or care coordination services.   Objective:  Medications Reviewed Today     Reviewed by Vanita Ingles, RN (Case Manager) on 05/21/21 at 1201  Med List Status: <None>   Medication Order Taking? Sig Documenting Provider Last Dose Status Informant  Accu-Chek Softclix Lancets lancets [id number] No Use to check blood sugar up to twice daily [name], DO Taking Active   acetaminophen (TYLENOL) 500 MG tablet [id number] No Take 1,000 mg by mouth 2 (two) times daily as needed. [provider] Taking Active   amLODipine (NORVASC) 10 MG tablet [id number] No Take 1 tablet (10 mg total) by mouth daily. [name], DO Taking Active   aspirin 81 MG chewable tablet [id number] No Chew 81 mg by mouth daily.  [provider] Taking Active Self           Med Note [name], ANA K   Wed Jun 07, 2018 11:10 AM)    Blood Glucose Monitoring Suppl (ACCU-CHEK GUIDE) w/Device KIT [id number] No Use as directed to monitor blood sugar [place], Devonne Doughty, DO Taking Active   cloNIDine (CATAPRES) 0.1 MG tablet [id number] No TAKE 1 TABLET BY MOUTH DAILY AS NEEDED FOR ELEVATED BLOOD PRESSURE GREATER THAN 180/100/HEADACHE [name], DO Taking Active   diclofenac Sodium (VOLTAREN) 1 % GEL [id number] No Apply 2 g topically 4 (four) times daily as needed (arthritis knee, elbow).  Patient not taking: Reported on 03/06/2021   [name], DO Not Taking Active   gabapentin (NEURONTIN) 300 MG capsule [id number] No TAKE 1 CAPSULE(300 MG) BY MOUTH IN THE  MORNING AND AT BEDTIME  Patient not taking: Reported on 03/06/2021   [name], DO Not Taking Active   glucose blood (ACCU-CHEK GUIDE) test strip [id number] No Use to check blood sugar up to twice daily [name], DO Taking Active   Lancets [facility] ULTRASOFT) lancets [id number] No Use to check blood sugar daily, up to 2 times if need [name], DO Taking Active   lisinopril-hydrochlorothiazide (ZESTORETIC) 20-12.5 MG tablet [id number] No Take 1 tablet by mouth daily. [name], DO Taking Active   Multiple Vitamins-Minerals (CERTAVITE SENIOR/ANTIOXIDANT) TABS [id number] No Take by mouth. [provider] Taking Active   pravastatin (PRAVACHOL) 20 MG tablet [id number] No Take 1 tablet (20 mg total) by mouth at bedtime. [name], DO Taking Active             Pertinent Labs:   Lab Results  Component Value Date   CREATININE 1.09 (H) 03/24/2021   BUN 21 03/24/2021   NA 140 03/24/2021   K 4.1 03/24/2021   CL 103 03/24/2021   CO2 29 03/24/2021   BP Readings from Last 3 Encounters:  05/13/21 124/70  05/03/21 (!) 186/73  03/24/21 (!) 143/54   Pulse Readings from Last 3 Encounters:  05/13/21 65  05/03/21 65  03/24/21 (!) 59    SDOH:  (Social Determinants of Health) assessments and interventions performed:    [place]  Review of patient past medical history, allergies, medications, health status, including review  of consultants reports, laboratory and other test data, was performed as part of comprehensive evaluation and provision of chronic care management services.   Care Plan : PharmD - HTN, med mgmt  Updates made by Rennis Petty, RPH-CPP since 06/19/2021 12:00 AM     Problem: Disease Progression      Long-Range Goal: Disease Progression Prevented or Minimized   Start Date: 03/06/2021  Expected End Date: 06/04/2021  Recent Progress: On track  Priority: High  Note:   Current Barriers:   Unable to achieve control of blood pressure  Limited social support  Pharmacist Clinical Goal(s):  patient will achieve control of blood pressure as evidenced by home and in office blood pressure readings through collaboration with PharmD and provider.   Interventions: 1:1 collaboration with [name], DO regarding development and update of comprehensive plan of care as evidenced by provider attestation and co-signature Inter-disciplinary care team collaboration (see longitudinal plan of care) Perform chart review Patient seen for Office Visit with [place] on 4/11 regarding right hand fracture Office Visit with PCP on 4/12. Provider advised patient: to follow up for mammogram/ultrasound Note imaging completed on 4/19 Today speak with patient briefly. She reports that her grandson passed away recently.  Hypertension: Current treatment:  Amlodipine 10 mg daily Lisinopril-HCTZ 20-12.5 mg daily Clonidine 0.1 mg daily as needed for elevated blood pressure greater than 180/100/headache Reports ran out of amlodipine and lisinopril-HCTZ Rxs yesterday. Reports she is also out of clonidine [place] on behalf of patient today to request these refills Follow up with patient who states that she will pick up and restart taking her amlodipine 10 mg daily, lisinopril-HCTZ 20-12.5 mg daily and clonidine Rx if needed Patient has an upper arm blood pressure monitor Reports blood pressure last night was 170/69, HR 70 last night, but recalls recent systolic readings had been running in 140s Attributes elevated reading last night to the stress of finding out about the loss of her grandson Counsel patient to continue to monitor home blood pressure, keep log and bring record an to call office for readings outside of established parameters Will collaborate with CCM team/PCP   Patient Goals/Self-Care Activities patient will:  - take medications as  prescribed as evidenced by patient report and record review - check blood pressure, document, and provide at future appointments      Plan: The care management team will reach out to the patient again over the next 60 days.  Wallace Cullens, PharmD, Para [place], CPP Clinical Pharmacist [facility] [phone]

## 2021-06-25 ENCOUNTER — Ambulatory Visit: Payer: Self-pay

## 2021-06-25 ENCOUNTER — Telehealth: Payer: Medicare Other

## 2021-06-25 DIAGNOSIS — M79641 Pain in right hand: Secondary | ICD-10-CM

## 2021-06-25 DIAGNOSIS — E1169 Type 2 diabetes mellitus with other specified complication: Secondary | ICD-10-CM

## 2021-06-25 DIAGNOSIS — I10 Essential (primary) hypertension: Secondary | ICD-10-CM

## 2021-06-25 DIAGNOSIS — S62346A Nondisplaced fracture of base of fifth metacarpal bone, right hand, initial encounter for closed fracture: Secondary | ICD-10-CM

## 2021-06-25 DIAGNOSIS — M1711 Unilateral primary osteoarthritis, right knee: Secondary | ICD-10-CM

## 2021-06-25 DIAGNOSIS — Z9181 History of falling: Secondary | ICD-10-CM

## 2021-06-25 DIAGNOSIS — E1121 Type 2 diabetes mellitus with diabetic nephropathy: Secondary | ICD-10-CM

## 2021-06-25 DIAGNOSIS — F439 Reaction to severe stress, unspecified: Secondary | ICD-10-CM

## 2021-06-25 DIAGNOSIS — F4321 Adjustment disorder with depressed mood: Secondary | ICD-10-CM

## 2021-06-25 DIAGNOSIS — M25562 Pain in left knee: Secondary | ICD-10-CM

## 2021-06-25 NOTE — Patient Instructions (Signed)
Visit Information  Thank you for taking time to visit with me today. Please don't hesitate to contact me if I can be of assistance to you before our next scheduled telephone appointment.  Following are the goals we discussed today:  Diabetes:  (Status: Goal on Track (progressing): YES.) Long Term Goal         Lab Results  Component Value Date    HGBA1C 5.9 (H) 03/24/2021    Assessed patient's understanding of A1c goal: <7%. 05-21-2021: The patient knows goals and is well below goal. Provided education to patient about basic DM disease process. 06-25-2021: Knows about her DM and how to effectively manage; Reviewed medications with patient and discussed importance of medication adherence. 03-05-2021: The patient states she is taking medications as prescribed. She is having some affordability concerns. Scheduled to talk to the pharm D tomorrow. Will collaborate with the pharm D concerning expressed needs. 06-25-2021: The patient is actively working with the pharm D.         Reviewed prescribed diet with patient heart healthy/ADA diet. 03-05-2021: The patient states that she pretty much eats what she wants to eat. Review of heart healthy/ADA diet and monitoring for salt, fats, and sugars in her diet. Will send educational material through my chart and the EMMI system. 06-25-2021: The patient is compliant with heart healthy/ADA diet. Has been stressed out this week due to her grandson passing away and his services was yesterday. At the time of the call the patient was sitting on her porch, eating breakfast.  Counseled on importance of regular laboratory monitoring as prescribed. 06-25-2021: Patients most recent A1C is WNL. Patient is mindful of watching her blood sugars.  Discussed plans with patient for ongoing care management follow up and provided patient with direct contact information for care management team;      Provided patient with written educational materials related to hypo and hyperglycemia and  importance of correct treatment. 03-05-2021: Has not been checking her blood sugars as she does not have a glucose meter that is functional. Discussed options with the patient and she will discuss this with the pharm D on 03-06-2021.  She denies any shaking, confusion, light headedness, dizziness, or sx and sx of hyperglycemia. 06-25-2021: The patient knows how to effectively manage blood sugars;       Reviewed scheduled/upcoming provider appointments including: next pcp appointment on 07-22-2021 at [place] am    Advised patient, providing education and rationale, to check cbg twice daily, when you have symptoms of low or high blood sugar, and before and after exercise and record. 03-05-2021: The patient is open to checking blood sugars and is interested in a new device. Has 2 non-working devices.       call provider for findings outside established parameters;       Referral made to pharmacy team for assistance with medication reconciliation and help with a glucose meter that her insurance will pay for due to cost constraints and concerns- she currently has 2 devices but neither are operational- she has not been checking her blood sugars;       Referral made to social work team for assistance with support and education;      Review of patient status, including review of consultants reports, relevant laboratory and other test results, and medications completed;       Advised patient to discuss concerns about her DM and effective management of DM with provider;      Screening for signs and symptoms of depression  related to chronic disease state;        Assessed social determinant of health barriers;          Hyperlipidemia:  (Status: Goal on Track (progressing): YES.) Long Term Goal       Lab Results  Component Value Date    CHOL 206 (H) 03/24/2021    HDL 65 03/24/2021    LDLCALC 124 (H) 03/24/2021    TRIG 75 03/24/2021    CHOLHDL 3.2 03/24/2021      Medication review performed; medication list updated in  electronic medical record. 06-25-2021: The patient is taking pravachol 20 mg daily for cholesterol management  Provider established cholesterol goals reviewed; Counseled on importance of regular laboratory monitoring as prescribed. 05-21-2021: The patient has had recent lab work. Education and support given Provided HLD educational materials; Reviewed role and benefits of statin for ASCVD risk reduction; Discussed strategies to manage statin-induced myalgias. 05-21-2021: The patient denies any acute findings or issues related to statin use; Reviewed importance of limiting foods high in cholesterol. 03-05-2021: The patient eats pretty much what she wants to eat. Discussed dietary restrictions with the patient. She states she eats a lot of croissants. Reviewed foods high in sugars, sweets, and fats. Will sent information by the EMMI system and my chart. 06-25-2021: The patient has had a hard couple of weeks. Is eating and monitoring her dietary intake. Denies any acute findings today.    Hypertension: (Status: Goal on Track (progressing): YES.) Long Term Goal  Last practice recorded BP readings:     BP Readings from Last 3 Encounters:  05/13/21 124/70  05/03/21 (!) 186/73  03/24/21 (!) 143/54  Most recent eGFR/CrCl:       Lab Results  Component Value Date    EGFR 49 (L) 03/24/2021    No components found for: CRCL   Evaluation of current treatment plan related to hypertension self management and patient's adherence to plan as established by provider. 03-05-2021: The patient states that her blood pressures were high at the provider office but at home it has been 170/40 and 170/60 and she feels this is good. Extensive education given on the risk of heart attack and stroke with elevated blood pressures. She denies any sx or sx when her blood pressures are elevated. Expressed concern due to elevations and review of medications. The patient endoreses taking medications as ordered. CCM team involvement will  hopefully provide additional support and help with management of HTN.   05-06-2021: The patient has the additional pain medications and feels like she is better and her blood pressure is not as high. Discussed getting in with orthopedic provider sooner. See additional notes. 05-21-2021: The patient has more stable blood pressures and knows how to effectively manage her blood pressures and heart health. 06-25-2021: The patient had run out of her medications and the pharm D assisted with getting her medications refilled. She states compliance now. She says she has really been stressed out with the recent passing of her grandson and her pressures have been up a little but the one she last took was 139/44. Self reported. Education on monitoring for changes in blood pressures and calling the office for out or range consistent readings.  Provided education to patient re: stroke prevention, s/s of heart attack and stroke; Reviewed prescribed diet heart healthy/ADA diet. 03-05-2021: The patient admits that she pretty much eats what she wants to eat. Education given. Will send information for healthy eating by mail, by email, by my chart system,  and EMMI system. Education on the importance of monitoring for contents of sodium, fats, and sugars in diet. 06-25-2021: Education and support given for effective management of chronic conditions by supplementation of heart healthy/ADA diet Reviewed medications with patient and discussed importance of compliance. 03-05-2021: The patient is compliant with medications. Pharm D will reach out to the patient on 03-06-2021 for medications reconciliation and support in medications management. 06-25-2021: The patient states that she has her medications and it taking them as directed. Continue to work with the pharm D on a regular basis;  Discussed plans with patient for ongoing care management follow up and provided patient with direct contact information for care management team; Advised patient,  providing education and rationale, to monitor blood pressure daily and record, calling PCP for findings outside established parameters. 03-05-2021: The patient educated on the goals of systolic pressure of <962-836 and diastolic of <62. The patient states when she checks her blood pressure at home it has been 170/40 and 170/60. At office visits it has been 196/80 and 172/78.  The patient states she has seen it >200/96 at times. Education on the risk of heart attack and stroke and the concerns of elevations in blood pressures. The patient states she has been stressed out because her "baby daughter" had a heart attack recently and she was going to travel to a different states to help her. Encouraged the patient to not drive for long distances and to keep in touch by phone. She says right now she is not making the trip to her daughters because she is doing well. Empathetic listening and support given. Will continue to monitor; 05-05-2021: The patient had a fall over a week ago and was not feeling better after the fall and went to be evaluated at the ER. Her blood pressure was elevated and she has a fracture in her hand/fingers on the right hand and her left knee has torn ligaments. She is a little stressed out. Will see the pcp next week and will need an orthopedic evaluation. Education and support given. The patient states: "This too shall pass". 06-25-2021: The patient is dealing with grief and stress over the recent death of her grandson. She states his services were yesterday. She said her family left this am going back home. She is dealing with things the best way she can. Education and support given.  Reviewed scheduled/upcoming provider appointments including: 07-22-2021 at 940 am Advised patient to discuss blood pressure elevations and HTN concerns with provider; Provided education on prescribed diet heart healthy/ADA diet. 06-25-2021: Review and education given;  Discussed complications of poorly controlled blood  pressure such as heart disease, stroke, circulatory complications, vision complications, kidney impairment, sexual dysfunction;    Oncology- History of left breast cancer:  (Goal Met.) Long Term Goal  06-25-2021: Closing this goal as there are no new findings and the conditions is stable. Assessment of understanding of oncology diagnosis: 03-05-2021: The patient states that she had to cancel her mammogram to evaluate left breast at the site of lumpectomy due to her breast being very sore and she did not want them mashing on it. The patient states that she will reschedule it. She did see oncologist on 02-26-2021 and they want the mammogram and will follow up accordingly. The patient is currently not taking treatments and did have a lumpectomy but per the oncologist there is a chance for reoccurrence. Eduction and support given. Will continue to monitor. 05-05-2021: No new concerns related to breast cancer, will continue  to monitor for changes or new needs. 05-21-2021: The patient had a mammogram on 05-20-2021. The report is back and there is no masses or malignancies present. Assessed patient understanding of cancer diagnosis and recommended treatment plan Reviewed upcoming provider appointments and treatment appointments. 07-22-2021 at 0940 am Assessed available transportation to appointments and treatments. Has consistent/reliable transportation: Yes Assessed support system. Has consistent/reliable family or other support: Yes Referred to licensed clinical social work for ongoing support and education     Falls:  (Status: New goal.) Long Term Goal  Provided written and verbal education re: potential causes of falls and Fall prevention strategies Reviewed medications and discussed potential side effects of medications such as dizziness and frequent urination. 05-05-2021: The patient has not picked up the medications from the pharmacy for pain relief. She has been taking regular Extra Strength Tylenol. The patient  states that she cannot get anyone to go pick it up but she is going to try and go today. Education provided on this medication being stronger than the other medications and to use with precaution. 05-06-2021: The patient has her stronger pain medications. Education on monitoring for increased sedation with stronger pain medications. The patient states she knows to be safe and monitor the intake closely. 06-25-2021: Review of fall precautions and safety. The patient has not had any new falls. Advised patient of importance of notifying provider of falls. 05-05-2021: Reviewed with the patient the importance of reporting falls to the provider. Last fall was over a week ago when she fell in her yard. She states that she went to the ER on 05-03-2021 and did not know she had injuries until after xrays were done. She states she has to see the pcp to get a referral for orthopedic provider. Education and support given. 05-06-2021: Collaboration with the pcp and an urgent referral was placed for orthopedic. Education provided to the patient the patient states when she called the numbers the ER gave her they told her she needed a referral from the pcp. Education and support provided. 06-25-2021: the patient states she needs to contact the orthopedic provider for a follow up visit for her right elbow as she is having pain at night and numbness. This may be related to the fall she had earlier this year. Education and support given.  Assessed for signs and symptoms of orthostatic hypotension Assessed for falls since last encounter. 05-05-2021: The last fall was over a week ago when she fell in her yard when she stumbled over a stump. The patient was evaluated in the ER on 05-03-2021. Follow up with the pcp on 05-13-2021, Awaiting referral for ortho provider, referral has been placed. Patient knows to expect a call from them. 06-25-2021: The patient denies any new falls at this time. Review with the patient being safe and monitoring for  changes.  Assessed patients knowledge of fall risk prevention secondary to previously provided education Advised patient to discuss falls and safety concerns with provider      Pain:  (Status: New goal.) Long Term Goal  Pain assessment performed. 05-05-2021: the patient rates her pain level at a 2 today in her right hand and left knee. She had a fall over a week ago with injuries to her right hand, forearm, and right upper arm and left knee. The patient has a fracture in her 5th metacarpus on the right hand and states she has torn ligaments in her left knee. She is taking Extra Strength Tylenol for pain relief. 05-06-2021: The patient  is managing her pain effectively. The patient has picked up her pain medications and her pain is being managed well. Collaboration with the pcp related to orthopedic referral and the need for a referral from the orthopedic provider. 05-21-2021: The patient was outside working in her garden. Is wearing her brace on her hand. Denies pain or discomfort today. Seeing specialist and will continue to follow up with specialist. 06-25-2021: The patient states she is not having pain in her right elbow now but when she goes to use it and at night it causes her a lot of pain. She states she can hardly get any rest because of it. The patient states that she needs to contact the orthopedic provider and get an appointment to follow up. Education provided on safety and making sure she follows up with the provider.  Medications reviewed. 05-05-2021: Has not been able to pick up her prescribed pain medications. Is going to try and go pick them up today. Is taking Extra strength Tylenol for pain at this time. 05-06-2021: The patient states that she has her stronger pain medications now. 06-25-2021: The patient states that she is out of her Tylenol and just does not feel like going out and getting any. She has had a hard week. Education on taking medications as directed. Will continue to monitor for changes  or new needs.  Reviewed provider established plan for pain management; Discussed importance of adherence to all scheduled medical appointments. 05-13-2021 at 840 am. Awaiting for referral for orthopedic provider. Next pcp appointment. 07-22-2021 at 0940 am Counseled on the importance of reporting any/all new or changed pain symptoms or management strategies to pain management provider; Advised patient to report to care team affect of pain on daily activities; Discussed use of relaxation techniques and/or diversional activities to assist with pain reduction (distraction, imagery, relaxation, massage, acupressure, TENS, heat, and cold application; Reviewed with patient prescribed pharmacological and nonpharmacological pain relief strategies; Advised patient to discuss unresolved pain, changes in level or intensity of pain with provider;      Grief and stress  (Status: New goal. Goal on Track (progressing): YES.) Long Term Goal  Evaluation of current treatment plan related to  grief and stress , Mental Health Concerns  self-management and patient's adherence to plan as established by provider. Discussed plans with patient for ongoing care management follow up and provided patient with direct contact information for care management team Advised patient to call the office for changes in grief, mood, anxiety, stress level and depression. The patient recently had one of her grandsons to pass away and his services were on 06-24-2021. The patient states this has really stressed her out but she will get through this with the help of the "Lord"; Provided education to patient re: the grief process and support available while she is grieving. ; Reviewed scheduled/upcoming provider appointments including 07-22-2021 at 1145 am; Discussed plans with patient for ongoing care management follow up and provided patient with direct contact information for care management team; Advised patient to discuss unresolved grief, or  new mental health concerns with provider;   Our next appointment is by telephone on 08-20-2021 at 1145 am   Please call the care guide team at [phone] if you need to cancel or reschedule your appointment.   If you are experiencing a Mental Health or [place] or need someone to talk to, please call the Suicide and Crisis Lifeline: 988 call the [place] National Suicide Prevention Lifeline: 260-398-8687 or TTY: (585)577-2042 TTY (  9738418730) to talk to a trained counselor call 1-800-273-TALK (toll free, 24 hour hotline)   Patient verbalizes understanding of instructions and care plan provided today and agrees to view in [place]. Active MyChart status and patient understanding of how to access instructions and care plan via MyChart confirmed with patient.     Noreene Larsson RN, MSN, [facility] Mobile: [phone]

## 2021-06-25 NOTE — Chronic Care Management (AMB) (Signed)
Chronic Care Management   CCM RN Visit Note  06/25/2021 Name: Ellen [name] MRN: [id number] DOB: [birth year]  Subjective: Ellen [name] is a 88 y.o. year old female who is a primary care patient of [name], DO. The care management team was consulted for assistance with disease management and care coordination needs.    Engaged with patient by telephone for follow up visit in response to provider referral for case management and/or care coordination services.   Consent to Services:  The patient was given information about Chronic Care Management services, agreed to services, and gave verbal consent prior to initiation of services.  Please see initial visit note for detailed documentation.   Patient agreed to services and verbal consent obtained.   Assessment: Review of patient past medical history, allergies, medications, health status, including review of consultants reports, laboratory and other test data, was performed as part of comprehensive evaluation and provision of chronic care management services.   SDOH (Social Determinants of Health) assessments and interventions performed:    CCM Care Plan  Allergies  Allergen Reactions   Codeine Nausea Only   Morphine And Related Nausea Only    Outpatient Encounter Medications as of 06/25/2021  Medication Sig   Accu-Chek Softclix Lancets lancets Use to check blood sugar up to twice daily   acetaminophen (TYLENOL) 500 MG tablet Take 1,000 mg by mouth 2 (two) times daily as needed.   amLODipine (NORVASC) 10 MG tablet Take 1 tablet (10 mg total) by mouth daily.   aspirin 81 MG chewable tablet Chew 81 mg by mouth daily.    Blood Glucose Monitoring Suppl (ACCU-CHEK GUIDE) w/Device KIT Use as directed to monitor blood sugar   cloNIDine (CATAPRES) 0.1 MG tablet TAKE 1 TABLET BY MOUTH DAILY AS NEEDED FOR ELEVATED BLOOD PRESSURE GREATER THAN 180/100/HEADACHE   diclofenac Sodium (VOLTAREN) 1 % GEL Apply 2 g topically 4 (four)  times daily as needed (arthritis knee, elbow). (Patient not taking: Reported on 03/06/2021)   gabapentin (NEURONTIN) 300 MG capsule TAKE 1 CAPSULE(300 MG) BY MOUTH IN THE MORNING AND AT BEDTIME (Patient not taking: Reported on 03/06/2021)   glucose blood (ACCU-CHEK GUIDE) test strip Use to check blood sugar up to twice daily   Lancets (ONETOUCH ULTRASOFT) lancets Use to check blood sugar daily, up to 2 times if need   lisinopril-hydrochlorothiazide (ZESTORETIC) 20-12.5 MG tablet Take 1 tablet by mouth daily.   Multiple Vitamins-Minerals (CERTAVITE SENIOR/ANTIOXIDANT) TABS Take by mouth.   pravastatin (PRAVACHOL) 20 MG tablet Take 1 tablet (20 mg total) by mouth at bedtime.   No facility-administered encounter medications on file as of 06/25/2021.    Patient Active Problem List   Diagnosis Date Noted   Multinodular goiter 12/25/2019   Aortic atherosclerosis ([place]) 12/18/2019   Genetic testing 10/06/2018   Family history of ovarian cancer    Dysuria 09/24/2018   Rotator cuff arthropathy of right shoulder 06/16/2018   Rotator cuff arthropathy of left shoulder 03/13/2018   Chronic pain of both shoulders 03/13/2018   Primary cancer of upper outer quadrant of left female breast ([place]) 01/08/2018   Type 2 diabetes mellitus with diabetic nephropathy, without long-term current use of insulin ([place]) 11/10/2017   Chronic headaches 11/10/2017   Hyperlipidemia associated with type 2 diabetes mellitus ([place]) 11/10/2017   Essential hypertension 11/10/2017   Enteritis 09/18/2017    Conditions to be addressed/monitored:HTN, HLD, DMII, and History of breast cancer, falls, chronic pain, grief and stress  Care Plan : RNCM: General Plan of  Care (Adult) For Chronic Disease Management and Care Coordination Needs  Updates made by Vanita Ingles, RN since 06/25/2021 12:00 AM     Problem: RNCM: Development of Plan of Care for Chronic Disease Management (HTN, HLD, DM, History of breast cancer)   Priority: High      Long-Range Goal: RNCM: Effective Management of Plan of Care for Chronic Disease Management (HTN, HLD, DM, Pain, and falls,  History of breast cancer)   Start Date: 03/05/2021  Expected End Date: 03/05/2022  Note:   Current Barriers:  Knowledge Deficits related to plan of care for management of HTN, HLD, DMII, Chronic pain, falls and safety concerns and history of breast cancer (left breast with soreness at site of lumpectomy currently)  Care Coordination needs related to Limited social support, Social Isolation, and Memory Deficits  Chronic Disease Management support and education needs related to HTN, HLD, DMII, and history of breast cancer to left breast with soreness at the site of lumpectomy currently Lacks caregiver support.         RNCM Clinical Goal(s):  Patient will verbalize basic understanding of HTN, HLD, DMII, pain and fall and safety concerns and History of breast cancer  disease process and self health management plan as evidenced by keeping appointments, following the plan of care by her providers, calling the office for questions and concerns, and working with the CCM team to effectively manage health and well being  take all medications exactly as prescribed and will call provider for medication related questions as evidenced by compliance with medications, working with pharm D for medications/equipment needs and calling for refills before running out of medications    attend all scheduled medical appointments: routine appointments with the pcp and specialist follow up with specialist in place as evidenced by keeping appointments and calling for schedule change needs        demonstrate improved and ongoing adherence to prescribed treatment plan for HTN, HLD, DMII, and history of breast cancer as evidenced by blood pressure trending downward, lab work stable, blood sugars WNL with the ability to monitor blood sugars consistently, no reoccurrence of breast cancer- currently the patient  is experiencing some soreness to left breast at the site of the lumpectomy continue to work with Consulting civil engineer and/or Social Worker to address care management and care coordination needs related to HTN, HLD, DMII, and history of breast cancer as evidenced by adherence to CM Team Scheduled appointments     work with pharmacist to address Medication procurement and the need for a functional blood glucose testing device related to HTN, HLD, DMII, and history of breast cancer as evidenced by review of EMR and patient or pharmacist report    demonstrate ongoing self health care management ability for effective management of chronic conditions as evidenced by working with the CCM team  through collaboration with Consulting civil engineer, provider, and care team.   Interventions: 1:1 collaboration with primary care provider regarding development and update of comprehensive plan of care as evidenced by provider attestation and co-signature Inter-disciplinary care team collaboration (see longitudinal plan of care) Evaluation of current treatment plan related to  self management and patient's adherence to plan as established by provider   Diabetes:  (Status: Goal on Track (progressing): YES.) Long Term Goal   Lab Results  Component Value Date   HGBA1C 5.9 (H) 03/24/2021    Assessed patient's understanding of A1c goal: <7%. 05-21-2021: The patient knows goals and is well below goal. Provided education to  patient about basic DM disease process. 06-25-2021: Knows about her DM and how to effectively manage; Reviewed medications with patient and discussed importance of medication adherence. 03-05-2021: The patient states she is taking medications as prescribed. She is having some affordability concerns. Scheduled to talk to the pharm D tomorrow. Will collaborate with the pharm D concerning expressed needs. 06-25-2021: The patient is actively working with the pharm D.         Reviewed prescribed diet with patient heart  healthy/ADA diet. 03-05-2021: The patient states that she pretty much eats what she wants to eat. Review of heart healthy/ADA diet and monitoring for salt, fats, and sugars in her diet. Will send educational material through my chart and the EMMI system. 06-25-2021: The patient is compliant with heart healthy/ADA diet. Has been stressed out this week due to her grandson passing away and his services was yesterday. At the time of the call the patient was sitting on her porch, eating breakfast.  Counseled on importance of regular laboratory monitoring as prescribed. 06-25-2021: Patients most recent A1C is WNL. Patient is mindful of watching her blood sugars.  Discussed plans with patient for ongoing care management follow up and provided patient with direct contact information for care management team;      Provided patient with written educational materials related to hypo and hyperglycemia and importance of correct treatment. 03-05-2021: Has not been checking her blood sugars as she does not have a glucose meter that is functional. Discussed options with the patient and she will discuss this with the pharm D on 03-06-2021.  She denies any shaking, confusion, light headedness, dizziness, or sx and sx of hyperglycemia. 06-25-2021: The patient knows how to effectively manage blood sugars;       Reviewed scheduled/upcoming provider appointments including: next pcp appointment on 07-22-2021 at [place] am    Advised patient, providing education and rationale, to check cbg twice daily, when you have symptoms of low or high blood sugar, and before and after exercise and record. 03-05-2021: The patient is open to checking blood sugars and is interested in a new device. Has 2 non-working devices.       call provider for findings outside established parameters;       Referral made to pharmacy team for assistance with medication reconciliation and help with a glucose meter that her insurance will pay for due to cost constraints and  concerns- she currently has 2 devices but neither are operational- she has not been checking her blood sugars;       Referral made to social work team for assistance with support and education;      Review of patient status, including review of consultants reports, relevant laboratory and other test results, and medications completed;       Advised patient to discuss concerns about her DM and effective management of DM with provider;      Screening for signs and symptoms of depression related to chronic disease state;        Assessed social determinant of health barriers;         Hyperlipidemia:  (Status: Goal on Track (progressing): YES.) Long Term Goal  Lab Results  Component Value Date   CHOL 206 (H) 03/24/2021   HDL 65 03/24/2021   LDLCALC 124 (H) 03/24/2021   TRIG 75 03/24/2021   CHOLHDL 3.2 03/24/2021     Medication review performed; medication list updated in electronic medical record. 06-25-2021: The patient is taking pravachol 20 mg daily for cholesterol  management  Provider established cholesterol goals reviewed; Counseled on importance of regular laboratory monitoring as prescribed. 05-21-2021: The patient has had recent lab work. Education and support given Provided HLD educational materials; Reviewed role and benefits of statin for ASCVD risk reduction; Discussed strategies to manage statin-induced myalgias. 05-21-2021: The patient denies any acute findings or issues related to statin use; Reviewed importance of limiting foods high in cholesterol. 03-05-2021: The patient eats pretty much what she wants to eat. Discussed dietary restrictions with the patient. She states she eats a lot of croissants. Reviewed foods high in sugars, sweets, and fats. Will sent information by the EMMI system and my chart. 06-25-2021: The patient has had a hard couple of weeks. Is eating and monitoring her dietary intake. Denies any acute findings today.   Hypertension: (Status: Goal on Track (progressing):  YES.) Long Term Goal  Last practice recorded BP readings:  BP Readings from Last 3 Encounters:  05/13/21 124/70  05/03/21 (!) 186/73  03/24/21 (!) 143/54  Most recent eGFR/CrCl:  Lab Results  Component Value Date   EGFR 49 (L) 03/24/2021    No components found for: CRCL  Evaluation of current treatment plan related to hypertension self management and patient's adherence to plan as established by provider. 03-05-2021: The patient states that her blood pressures were high at the provider office but at home it has been 170/40 and 170/60 and she feels this is good. Extensive education given on the risk of heart attack and stroke with elevated blood pressures. She denies any sx or sx when her blood pressures are elevated. Expressed concern due to elevations and review of medications. The patient endoreses taking medications as ordered. CCM team involvement will hopefully provide additional support and help with management of HTN.   05-06-2021: The patient has the additional pain medications and feels like she is better and her blood pressure is not as high. Discussed getting in with orthopedic provider sooner. See additional notes. 05-21-2021: The patient has more stable blood pressures and knows how to effectively manage her blood pressures and heart health. 06-25-2021: The patient had run out of her medications and the pharm D assisted with getting her medications refilled. She states compliance now. She says she has really been stressed out with the recent passing of her grandson and her pressures have been up a little but the one she last took was 139/44. Self reported. Education on monitoring for changes in blood pressures and calling the office for out or range consistent readings.  Provided education to patient re: stroke prevention, s/s of heart attack and stroke; Reviewed prescribed diet heart healthy/ADA diet. 03-05-2021: The patient admits that she pretty much eats what she wants to eat. Education given.  Will send information for healthy eating by mail, by email, by my chart system, and EMMI system. Education on the importance of monitoring for contents of sodium, fats, and sugars in diet. 06-25-2021: Education and support given for effective management of chronic conditions by supplementation of heart healthy/ADA diet Reviewed medications with patient and discussed importance of compliance. 03-05-2021: The patient is compliant with medications. Pharm D will reach out to the patient on 03-06-2021 for medications reconciliation and support in medications management. 06-25-2021: The patient states that she has her medications and it taking them as directed. Continue to work with the pharm D on a regular basis;  Discussed plans with patient for ongoing care management follow up and provided patient with direct contact information for care management team; Advised patient,  providing education and rationale, to monitor blood pressure daily and record, calling PCP for findings outside established parameters. 03-05-2021: The patient educated on the goals of systolic pressure of <696-295 and diastolic of <28. The patient states when she checks her blood pressure at home it has been 170/40 and 170/60. At office visits it has been 196/80 and 172/78.  The patient states she has seen it >200/96 at times. Education on the risk of heart attack and stroke and the concerns of elevations in blood pressures. The patient states she has been stressed out because her "baby daughter" had a heart attack recently and she was going to travel to a different states to help her. Encouraged the patient to not drive for long distances and to keep in touch by phone. She says right now she is not making the trip to her daughters because she is doing well. Empathetic listening and support given. Will continue to monitor; 05-05-2021: The patient had a fall over a week ago and was not feeling better after the fall and went to be evaluated at the ER. Her  blood pressure was elevated and she has a fracture in her hand/fingers on the right hand and her left knee has torn ligaments. She is a little stressed out. Will see the pcp next week and will need an orthopedic evaluation. Education and support given. The patient states: "This too shall pass". 06-25-2021: The patient is dealing with grief and stress over the recent death of her grandson. She states his services were yesterday. She said her family left this am going back home. She is dealing with things the best way she can. Education and support given.  Reviewed scheduled/upcoming provider appointments including: 07-22-2021 at 940 am Advised patient to discuss blood pressure elevations and HTN concerns with provider; Provided education on prescribed diet heart healthy/ADA diet. 06-25-2021: Review and education given;  Discussed complications of poorly controlled blood pressure such as heart disease, stroke, circulatory complications, vision complications, kidney impairment, sexual dysfunction;   Oncology- History of left breast cancer:  (Goal Met.) Long Term Goal  06-25-2021: Closing this goal as there are no new findings and the conditions is stable. Assessment of understanding of oncology diagnosis: 03-05-2021: The patient states that she had to cancel her mammogram to evaluate left breast at the site of lumpectomy due to her breast being very sore and she did not want them mashing on it. The patient states that she will reschedule it. She did see oncologist on 02-26-2021 and they want the mammogram and will follow up accordingly. The patient is currently not taking treatments and did have a lumpectomy but per the oncologist there is a chance for reoccurrence. Eduction and support given. Will continue to monitor. 05-05-2021: No new concerns related to breast cancer, will continue to monitor for changes or new needs. 05-21-2021: The patient had a mammogram on 05-20-2021. The report is back and there is no masses or  malignancies present. Assessed patient understanding of cancer diagnosis and recommended treatment plan Reviewed upcoming provider appointments and treatment appointments. 07-22-2021 at 0940 am Assessed available transportation to appointments and treatments. Has consistent/reliable transportation: Yes Assessed support system. Has consistent/reliable family or other support: Yes Referred to licensed clinical social work for ongoing support and education   Falls:  (Status: New goal.) Long Term Goal  Provided written and verbal education re: potential causes of falls and Fall prevention strategies Reviewed medications and discussed potential side effects of medications such as dizziness and frequent urination.  05-05-2021: The patient has not picked up the medications from the pharmacy for pain relief. She has been taking regular Extra Strength Tylenol. The patient states that she cannot get anyone to go pick it up but she is going to try and go today. Education provided on this medication being stronger than the other medications and to use with precaution. 05-06-2021: The patient has her stronger pain medications. Education on monitoring for increased sedation with stronger pain medications. The patient states she knows to be safe and monitor the intake closely. 06-25-2021: Review of fall precautions and safety. The patient has not had any new falls. Advised patient of importance of notifying provider of falls. 05-05-2021: Reviewed with the patient the importance of reporting falls to the provider. Last fall was over a week ago when she fell in her yard. She states that she went to the ER on 05-03-2021 and did not know she had injuries until after xrays were done. She states she has to see the pcp to get a referral for orthopedic provider. Education and support given. 05-06-2021: Collaboration with the pcp and an urgent referral was placed for orthopedic. Education provided to the patient the patient states when she  called the numbers the ER gave her they told her she needed a referral from the pcp. Education and support provided. 06-25-2021: the patient states she needs to contact the orthopedic provider for a follow up visit for her right elbow as she is having pain at night and numbness. This may be related to the fall she had earlier this year. Education and support given.  Assessed for signs and symptoms of orthostatic hypotension Assessed for falls since last encounter. 05-05-2021: The last fall was over a week ago when she fell in her yard when she stumbled over a stump. The patient was evaluated in the ER on 05-03-2021. Follow up with the pcp on 05-13-2021, Awaiting referral for ortho provider, referral has been placed. Patient knows to expect a call from them. 06-25-2021: The patient denies any new falls at this time. Review with the patient being safe and monitoring for changes.  Assessed patients knowledge of fall risk prevention secondary to previously provided education Advised patient to discuss falls and safety concerns with provider    Pain:  (Status: New goal.) Long Term Goal  Pain assessment performed. 05-05-2021: the patient rates her pain level at a 2 today in her right hand and left knee. She had a fall over a week ago with injuries to her right hand, forearm, and right upper arm and left knee. The patient has a fracture in her 5th metacarpus on the right hand and states she has torn ligaments in her left knee. She is taking Extra Strength Tylenol for pain relief. 05-06-2021: The patient is managing her pain effectively. The patient has picked up her pain medications and her pain is being managed well. Collaboration with the pcp related to orthopedic referral and the need for a referral from the orthopedic provider. 05-21-2021: The patient was outside working in her garden. Is wearing her brace on her hand. Denies pain or discomfort today. Seeing specialist and will continue to follow up with specialist.  06-25-2021: The patient states she is not having pain in her right elbow now but when she goes to use it and at night it causes her a lot of pain. She states she can hardly get any rest because of it. The patient states that she needs to contact the orthopedic provider and get  an appointment to follow up. Education provided on safety and making sure she follows up with the provider.  Medications reviewed. 05-05-2021: Has not been able to pick up her prescribed pain medications. Is going to try and go pick them up today. Is taking Extra strength Tylenol for pain at this time. 05-06-2021: The patient states that she has her stronger pain medications now. 06-25-2021: The patient states that she is out of her Tylenol and just does not feel like going out and getting any. She has had a hard week. Education on taking medications as directed. Will continue to monitor for changes or new needs.  Reviewed provider established plan for pain management; Discussed importance of adherence to all scheduled medical appointments. 05-13-2021 at 840 am. Awaiting for referral for orthopedic provider. Next pcp appointment. 07-22-2021 at 0940 am Counseled on the importance of reporting any/all new or changed pain symptoms or management strategies to pain management provider; Advised patient to report to care team affect of pain on daily activities; Discussed use of relaxation techniques and/or diversional activities to assist with pain reduction (distraction, imagery, relaxation, massage, acupressure, TENS, heat, and cold application; Reviewed with patient prescribed pharmacological and nonpharmacological pain relief strategies; Advised patient to discuss unresolved pain, changes in level or intensity of pain with provider;    Grief and stress  (Status: New goal. Goal on Track (progressing): YES.) Long Term Goal  Evaluation of current treatment plan related to  grief and stress , Mental Health Concerns  self-management and patient's  adherence to plan as established by provider. Discussed plans with patient for ongoing care management follow up and provided patient with direct contact information for care management team Advised patient to call the office for changes in grief, mood, anxiety, stress level and depression. The patient recently had one of her grandsons to pass away and his services were on 06-24-2021. The patient states this has really stressed her out but she will get through this with the help of the "Lord"; Provided education to patient re: the grief process and support available while she is grieving. ; Reviewed scheduled/upcoming provider appointments including 07-22-2021 at 1145 am; Discussed plans with patient for ongoing care management follow up and provided patient with direct contact information for care management team; Advised patient to discuss unresolved grief, or new mental health concerns with provider;   Patient Goals/Self-Care Activities: Take medications as prescribed - 06-25-2021: The patient endorses compliance with medications Attend all scheduled provider appointments, 06-25-2021: Review of upcoming appointments Call pharmacy for medication refills 3-7 days in advance of running out of medications Attend church or other social activities Perform all self care activities independently  Perform IADL's (shopping, preparing meals, housekeeping, managing finances) independently Call provider office for new concerns or questions  Work with the social worker to address care coordination needs and will continue to work with the clinical team to address health care and disease management related needs call the Suicide and Crisis Lifeline: 988 call the [place] National Suicide Prevention Lifeline: (754)276-9532 or TTY: 980 501 3219 TTY (301) 060-2875) to talk to a trained counselor call 1-800-273-TALK (toll free, 24 hour hotline) if experiencing a Mental Health or [place]  schedule  appointment with eye doctor check blood sugar at prescribed times: twice daily, when you have symptoms of low or high blood sugar, and before and after exercise- 03-05-2021: The patient currently not checking blood sugars. States she has 2 machines but both are non-functional. The patient states that she would like to get  another. Will collaborate with the pharm D on getting glucose meter functional for the patient.  check feet daily for cuts, sores or redness enter blood sugar readings and medication or insulin into daily log take the blood sugar log to all doctor visits trim toenails straight across drink 6 to 8 glasses of water each day eat fish at least once per week fill half of plate with vegetables limit fast food meals to no more than 1 per week manage portion size prepare main meal at home 3 to 5 days each week read food labels for fat, fiber, carbohydrates and portion size reduce red meat to 2 to 3 times a week keep feet up while sitting wash and dry feet carefully every day wear comfortable, cotton socks wear comfortable, well-fitting shoes check blood pressure 3 times per week choose a place to take my blood pressure (home, clinic or office, retail store) write blood pressure results in a log or diary learn about high blood pressure keep a blood pressure log take blood pressure log to all doctor appointments call doctor for signs and symptoms of high blood pressure develop an action plan for high blood pressure keep all doctor appointments take medications for blood pressure exactly as prescribed report new symptoms to your doctor eat more whole grains, fruits and vegetables, lean meats and healthy fats - call for medicine refill 2 or 3 days before it runs out - take all medications exactly as prescribed - call doctor with any symptoms you believe are related to your medicine - call doctor when you experience any new symptoms - go to all doctor appointments as scheduled -  adhere to prescribed diet: heart healthy/ADA diet       Plan:Telephone follow up appointment with care management team member scheduled for:  08-20-2021 at [place] am  Noreene Larsson RN, MSN, [place] Mobile: [phone]

## 2021-07-01 DIAGNOSIS — E785 Hyperlipidemia, unspecified: Secondary | ICD-10-CM

## 2021-07-01 DIAGNOSIS — Z7984 Long term (current) use of oral hypoglycemic drugs: Secondary | ICD-10-CM

## 2021-07-01 DIAGNOSIS — Z853 Personal history of malignant neoplasm of breast: Secondary | ICD-10-CM

## 2021-07-01 DIAGNOSIS — E1159 Type 2 diabetes mellitus with other circulatory complications: Secondary | ICD-10-CM

## 2021-07-01 DIAGNOSIS — I10 Essential (primary) hypertension: Secondary | ICD-10-CM

## 2021-07-22 ENCOUNTER — Ambulatory Visit: Payer: Medicare Other | Admitting: Family Medicine

## 2021-07-22 NOTE — Progress Notes (Signed)
Subjective:   Ellen [name] is a 88 y.o. female who presents for Medicare Annual (Subsequent) preventive examination.  Review of Systems    Per HPI unless specifically indicated below         :    Today's Vitals   07/23/21 0958  BP: 132/68  Pulse: 60  Resp: 17  SpO2: 100%  Weight: 132 lb (59.9 kg)  Height: 5' (1.524 m)  PainSc: 5    Body mass index is 25.78 kg/m.     05/03/2021   10:04 AM 08/09/2020   11:27 AM 06/07/2020   12:54 PM 12/11/2019    9:40 AM 10/09/2019    4:24 PM 09/27/2019   11:41 AM 08/13/2019    9:22 AM  Advanced Directives  Does Patient Have a Medical Advance Directive? No No No No No No No  Would patient like information on creating a medical advance directive? No - Patient declined   No - Patient declined No - Patient declined No - Patient declined     Current Medications (verified) Outpatient Encounter Medications as of 07/23/2021  Medication Sig   Accu-Chek Softclix Lancets lancets Use to check blood sugar up to twice daily   acetaminophen (TYLENOL) 500 MG tablet Take 1,000 mg by mouth 2 (two) times daily as needed.   amLODipine (NORVASC) 10 MG tablet Take 1 tablet (10 mg total) by mouth daily.   aspirin 81 MG chewable tablet Chew 81 mg by mouth daily.    Blood Glucose Monitoring Suppl (ACCU-CHEK GUIDE) w/Device KIT Use as directed to monitor blood sugar   cloNIDine (CATAPRES) 0.1 MG tablet TAKE 1 TABLET BY MOUTH DAILY AS NEEDED FOR ELEVATED BLOOD PRESSURE GREATER THAN 180/100/HEADACHE   diclofenac Sodium (VOLTAREN) 1 % GEL Apply 2 g topically 4 (four) times daily as needed (arthritis knee, elbow). (Patient not taking: Reported on 03/06/2021)   gabapentin (NEURONTIN) 300 MG capsule TAKE 1 CAPSULE(300 MG) BY MOUTH IN THE MORNING AND AT BEDTIME (Patient not taking: Reported on 03/06/2021)   glucose blood (ACCU-CHEK GUIDE) test strip Use to check blood sugar up to twice daily   Lancets (ONETOUCH ULTRASOFT) lancets Use to check blood sugar daily, up to 2 times if  need   lisinopril-hydrochlorothiazide (ZESTORETIC) 20-12.5 MG tablet Take 1 tablet by mouth daily.   Multiple Vitamins-Minerals (CERTAVITE SENIOR/ANTIOXIDANT) TABS Take by mouth.   pravastatin (PRAVACHOL) 20 MG tablet Take 1 tablet (20 mg total) by mouth at bedtime.   No facility-administered encounter medications on file as of 07/23/2021.    Allergies (verified) Codeine and Morphine and related   History: Past Medical History:  Diagnosis Date   Arthritis    Cancer ([place]) 01/2018   left breast    Cataract    Chronic headaches    CKD (chronic kidney disease)    Diabetes mellitus without complication ([place])    Diverticulitis    Family history of ovarian cancer    History of syncope    Hyperlipidemia    Hypertension    Multinodular goiter    Personal history of radiation therapy    Primary osteoarthritis of left knee    Pulmonary hypertension ([place])    Past Surgical History:  Procedure Laterality Date   ABDOMINAL HYSTERECTOMY     APPENDECTOMY     BREAST BIOPSY Left 01/03/2018   left breast stereo/x clip/positive   BREAST LUMPECTOMY Left 01/12/2018   BREAST LUMPECTOMY WITH NEEDLE LOCALIZATION Left 01/12/2018   Procedure: BREAST LUMPECTOMY WITH NEEDLE LOCALIZATION;  Surgeon: Caroleen Hamman  F, MD;  Location: [facility] ORS;  Service: General;  Laterality: Left;   CHOLECYSTECTOMY     COLON SURGERY     COLOSTOMY  2008   COLOSTOMY REVERSAL  2008   EYE SURGERY     REPLACEMENT TOTAL KNEE Right 2018   SENTINEL NODE BIOPSY Left 03/07/2018   Procedure: SENTINEL NODE BIOPSY;  Surgeon: Jules Husbands, MD;  Location: [facility] ORS;  Service: General;  Laterality: Left;   Family History  Problem Relation Age of Onset   Lung cancer Mother 59   Diabetes Father    Stroke Father    Hypertension Maternal Grandmother    Hypertension Maternal Uncle    Hypertension Maternal Aunt    Ovarian cancer Maternal Aunt 69   Heart disease Daughter    Social History   Socioeconomic History   Marital status:  Widowed    Spouse name: Not on file   Number of children: Not on file   Years of education: Not on file   Highest education level: Not on file  Occupational History   Not on file  Tobacco Use   Smoking status: Never   Smokeless tobacco: Never  Vaping Use   Vaping Use: Never used  Substance and Sexual Activity   Alcohol use: Never   Drug use: Never   Sexual activity: Not on file  Other Topics Concern   Not on file  Social History Narrative   ** Merged History Encounter **       Social Determinants of Health   Financial Resource Strain: Low Risk  (07/23/2021)   Overall Financial Resource Strain (CARDIA)    Difficulty of Paying Living Expenses: Not hard at all  Food Insecurity: No Food Insecurity (07/23/2021)   Hunger Vital Sign    Worried About Running Out of Food in the Last Year: Never true    [place] in the Last Year: Never true  Transportation Needs: No Transportation Needs (07/23/2021)   PRAPARE - Hydrologist (Medical): No    Lack of Transportation (Non-Medical): No  Physical Activity: Sufficiently Active (03/05/2021)   Exercise Vital Sign    Days of Exercise per Week: 3 days    Minutes of Exercise per Session: 60 min  Stress: No Stress Concern Present (07/23/2021)   [place]    Feeling of Stress : Only a little  Social Connections: Moderately Integrated (07/23/2021)   Social Connection and Isolation Panel [NHANES]    Frequency of Communication with Friends and Family: More than three times a week    Frequency of Social Gatherings with Friends and Family: More than three times a week    Attends Religious Services: More than 4 times per year    Active Member of [employer] or Organizations: Yes    Attends Archivist Meetings: More than 4 times per year    Marital Status: Widowed    Tobacco Counseling Counseling given: Not Answered   Clinical Intake:      Pain : 0-10 Pain Score: 5  Pain Type: Chronic pain Pain Location: Knee Pain Orientation: Left Pain Descriptors / Indicators: Constant Pain Frequency: Intermittent     Nutritional Status: BMI 25 -29 Overweight Nutritional Risks: None     Diabetic?Nutrition Risk Assessment:  Has the patient had any N/V/D within the last 2 months?  No  Does the patient have any non-healing wounds?  No  Has the patient had any unintentional weight  loss or weight gain?  No   Diabetes:  Is the patient diabetic?  Yes  If diabetic, was a CBG obtained today?  No  Did the patient bring in their glucometer from home?  No  How often do you monitor your CBG's? Pt doesn't check her blood sugar.   Financial Strains and Diabetes Management:  Are you having any financial strains with the device, your supplies or your medication? No .  Does the patient want to be seen by Chronic Care Management for management of their diabetes?  No  Would the patient like to be referred to a Nutritionist or for Diabetic Management?  No   Diabetic Exams:  Diabetic Eye Exam: Overdue for diabetic eye exam. Pt has been advised about the importance in completing this exam. Patient advised to call and schedule an eye exam. Diabetic Foot Exam: Completed 03/24/2021    Interpreter Needed?: No  Information entered by :: Donnie Mesa, CMA   Activities of Daily Living    07/23/2021   10:13 AM  In your present state of health, do you have any difficulty performing the following activities:  Hearing? 0  Vision? 0  Difficulty concentrating or making decisions? 1  Walking or climbing stairs? 0  Dressing or bathing? 0  Doing errands, shopping? 0    Patient Care Team: [name], DO as PCP - General (Family Medicine) Vanita Ingles, RN as Case Manager (General Practice) Curley Spice Virl Diamond, [place] as Pharmacist Delight Hoh , MD Oncology  Hessie Knows , MD Orthopaedic   Indicate any recent  Medical Services you may have received from other than [facility] providers in the past year (date may be approximate).   Pt seen at [facility] ER on 05/03/2021 for a fall. She slipped in her yard and injured her Rt arm.  Assessment:   This is a routine wellness examination for Ellen [name].  Hearing/Vision screen No results found.  Dietary issues and exercise activities discussed: Current Exercise Habits: Structured exercise class, Time (Minutes): 40, Frequency (Times/Week): 3, Weekly Exercise (Minutes/Week): 120, Intensity: Mild   Goals Addressed   None    Depression Screen    07/23/2021   10:15 AM 02/16/2021    9:50 AM 04/23/2019    9:39 AM 09/21/2018    9:19 AM 04/12/2018   11:02 AM 03/13/2018    8:05 AM 01/11/2018   10:34 AM  PHQ 2/9 Scores  PHQ - 2 Score 1 0 0 0 0 0 0  PHQ- 9 Score  0         Fall Risk    07/23/2021   10:13 AM 07/23/2021   10:12 AM 02/16/2021    9:50 AM 02/14/2020    2:12 PM 05/04/2019    8:38 AM  Fall Risk   Falls in the past year? 1 1 0 0 1  Number falls in past yr:  0 0  0  Injury with Fall?  1 0  0  Risk for fall due to :  History of fall(s) History of fall(s)    Follow up  Falls evaluation completed Falls evaluation completed      [place]:  Any stairs in or around the home? No  If so, are there any without handrails? No  Home free of loose throw rugs in walkways, pet beds, electrical cords, etc? Yes  Adequate lighting in your home to reduce risk of falls? Yes   ASSISTIVE DEVICES UTILIZED TO PREVENT FALLS:  Life  alert? Yes  Use of a cane, walker or w/c? No  Grab bars in the bathroom? Yes  Shower chair or bench in shower? No  Elevated toilet seat or a handicapped toilet? Yes   TIMED UP AND GO:  Was the test performed? Yes .  Length of time to ambulate 10 feet: 10 sec.   Gait slow and steady with assistive device  Cognitive Function:        07/23/2021   10:17 AM  6CIT Screen  What Year? 0 points  What month? 0  points  What time? 0 points  Count back from 20 0 points  Months in reverse 0 points  Repeat phrase 0 points  Total Score 0 points    Immunizations Immunization History  Administered Date(s) Administered   PFIZER(Purple Top)SARS-COV-2 Vaccination 04/01/2019, 04/24/2019, 01/04/2020   Tdap 06/21/2019    TDAP status: Up to date  Flu Vaccine status: Up to date  Pneumococcal vaccine status: Due, Education has been provided regarding the importance of this vaccine. Advised may receive this vaccine at local pharmacy or Health Dept. Aware to provide a copy of the vaccination record if obtained from local pharmacy or Health Dept. Verbalized acceptance and understanding.  Covid-19 vaccine status: Completed vaccines  Qualifies for Shingles Vaccine? Yes   Zostavax completed No   Shingrix Completed?: No.    Education has been provided regarding the importance of this vaccine. Patient has been advised to call insurance company to determine out of pocket expense if they have not yet received this vaccine. Advised may also receive vaccine at local pharmacy or Health Dept. Verbalized acceptance and understanding.  Screening Tests Health Maintenance  Topic Date Due   OPHTHALMOLOGY EXAM  Never done   Zoster Vaccines- Shingrix (1 of 2) Never done   COVID-19 Vaccine (4 - Booster for Pfizer series) 02/29/2020   Pneumonia Vaccine 34+ Years old (1 - PCV) 03/24/2022 (Originally 11/26/1994)   INFLUENZA VACCINE  09/01/2021   HEMOGLOBIN A1C  01/22/2022   FOOT EXAM  03/24/2022   TETANUS/TDAP  06/20/2029   DEXA SCAN  Completed   HPV VACCINES  Aged Out    Health Maintenance  Health Maintenance Due  Topic Date Due   OPHTHALMOLOGY EXAM  Never done   Zoster Vaccines- Shingrix (1 of 2) Never done   COVID-19 Vaccine (4 - Booster for Pfizer series) 02/29/2020    Colorectal cancer screening: No longer required.   Mammogram status: No longer required due to 05/20/2021.  DEXA SCAN:  09/08/2018    Lung Cancer Screening: (Low Dose CT Chest recommended if Age 52-80 years, 30 pack-year currently smoking OR have quit w/in 15years.) does not qualify.   Lung Cancer Screening Referral: does not qualify   Additional Screening:  Hepatitis C Screening: does qualify  Vision Screening: Recommended annual ophthalmology exams for early detection of glaucoma and other disorders of the eye. Is the patient up to date with their annual eye exam?  No  Who is the provider or what is the name of the office in which the patient attends annual eye exams? Pt advised on the importance of scheduling an appt.  If pt is not established with a provider, would they like to be referred to a provider to establish care? Yes .   Dental Screening: Recommended annual dental exams for proper oral hygiene  Community Resource Referral / Chronic Care Management: CRR required this visit?  No   CCM required this visit?  No  Plan:     I have personally reviewed and noted the following in the patient's chart:   Medical and social history Use of alcohol, tobacco or illicit drugs  Current medications and supplements including opioid prescriptions.  Functional ability and status Nutritional status Physical activity Advanced directives List of other physicians Hospitalizations, surgeries, and ER visits in previous 12 months Vitals Screenings to include cognitive, depression, and falls Referrals and appointments  In addition, I have reviewed and discussed with patient certain preventive protocols, quality metrics, and best practice recommendations. A written personalized care plan for preventive services as well as general preventive health recommendations were provided to patient.     Wilson Singer, [place]   07/23/2021       Ellen [name] , Thank you for taking time to come for your Medicare Wellness Visit. I appreciate your ongoing commitment to your health goals. Please review the  following plan we discussed and let me know if I can assist you in the future.   These are the goals we discussed:  Goals      Pharmacy Goals     Please check your home blood pressure, keep a log of the results and bring this with you to your medical appointments.  Feel free to call me with any questions or concerns. I look forward to our next call!   Wallace Cullens, PharmD, [place], CPP Clinical Pharmacist [facility] [phone]        This is a list of the screening recommended for you and due dates:  Health Maintenance  Topic Date Due   Eye exam for diabetics  Never done   Zoster (Shingles) Vaccine (1 of 2) Never done   COVID-19 Vaccine (4 - Booster for Pfizer series) 02/29/2020   Pneumonia Vaccine (1 - PCV) 03/24/2022*   Flu Shot  09/01/2021   Hemoglobin A1C  01/22/2022   Complete foot exam   03/24/2022   Tetanus Vaccine  06/20/2029   DEXA scan (bone density measurement)  Completed   HPV Vaccine  Aged Out  *Topic was postponed. The date shown is not the original due date.

## 2021-07-22 NOTE — Patient Instructions (Signed)
Fall Prevention in the Home, Adult Falls can cause injuries and can happen to people of all ages. There are many things you can do to make your home safe and to help prevent falls. Ask for help when making these changes. What actions can I take to prevent falls? General Instructions Use good lighting in all rooms. Replace any light bulbs that burn out. Turn on the lights in dark areas. Use night-lights. Keep items that you use often in easy-to-reach places. Lower the shelves around your home if needed. Set up your furniture so you have a clear path. Avoid moving your furniture around. Do not have throw rugs or other things on the floor that can make you trip. Avoid walking on wet floors. If any of your floors are uneven, fix them. Add color or contrast paint or tape to clearly mark and help you see: Grab bars or handrails. First and last steps of staircases. Where the edge of each step is. If you use a stepladder: Make sure that it is fully opened. Do not climb a closed stepladder. Make sure the sides of the stepladder are locked in place. Ask someone to hold the stepladder while you use it. Know where your pets are when moving through your home. What can I do in the bathroom?     Keep the floor dry. Clean up any water on the floor right away. Remove soap buildup in the tub or shower. Use nonskid mats or decals on the floor of the tub or shower. Attach bath mats securely with double-sided, nonslip rug tape. If you need to sit down in the shower, use a plastic, nonslip stool. Install grab bars by the toilet and in the tub and shower. Do not use towel bars as grab bars. What can I do in the bedroom? Make sure that you have a light by your bed that is easy to reach. Do not use any sheets or blankets for your bed that hang to the floor. Have a firm chair with side arms that you can use for support when you get dressed. What can I do in the kitchen? Clean up any spills right away. If  you need to reach something above you, use a step stool with a grab bar. Keep electrical cords out of the way. Do not use floor polish or wax that makes floors slippery. What can I do with my stairs? Do not leave any items on the stairs. Make sure that you have a light switch at the top and the bottom of the stairs. Make sure that there are handrails on both sides of the stairs. Fix handrails that are broken or loose. Install nonslip stair treads on all your stairs. Avoid having throw rugs at the top or bottom of the stairs. Choose a carpet that does not hide the edge of the steps on the stairs. Check carpeting to make sure that it is firmly attached to the stairs. Fix carpet that is loose or worn. What can I do on the outside of my home? Use bright outdoor lighting. Fix the edges of walkways and driveways and fix any cracks. Remove anything that might make you trip as you walk through a door, such as a raised step or threshold. Trim any bushes or trees on paths to your home. Check to see if handrails are loose or broken and that both sides of all steps have handrails. Install guardrails along the edges of any raised decks and porches. Clear paths of anything   that can make you trip, such as tools or rocks. Have leaves, snow, or ice cleared regularly. Use sand or salt on paths during winter. Clean up any spills in your garage right away. This includes grease or oil spills. What other actions can I take? Wear shoes that: Have a low heel. Do not wear high heels. Have rubber bottoms. Feel good on your feet and fit well. Are closed at the toe. Do not wear open-toe sandals. Use tools that help you move around if needed. These include: Canes. Walkers. Scooters. Crutches. Review your medicines with your doctor. Some medicines can make you feel dizzy. This can increase your chance of falling. Ask your doctor what else you can do to help prevent falls. Where to find more information Centers  for Disease Control and Prevention, [employer]: www.cdc.gov National Institute on Aging: www.nia.nih.gov Contact a doctor if: You are afraid of falling at home. You feel weak, drowsy, or dizzy at home. You fall at home. Summary There are many simple things that you can do to make your home safe and to help prevent falls. Ways to make your home safe include removing things that can make you trip and installing grab bars in the bathroom. Ask for help when making these changes in your home. This information is not intended to replace advice given to you by your health care provider. Make sure you discuss any questions you have with your health care provider. Document Revised: 10/20/2020 Document Reviewed: 08/22/2019 Elsevier Patient Education  2023 Elsevier Inc.  Health Maintenance, Female Adopting a healthy lifestyle and getting preventive care are important in promoting health and wellness. Ask your health care provider about: The right schedule for you to have regular tests and exams. Things you can do on your own to prevent diseases and keep yourself healthy. What should I know about diet, weight, and exercise? Eat a healthy diet  Eat a diet that includes plenty of vegetables, fruits, low-fat dairy products, and lean protein. Do not eat a lot of foods that are high in solid fats, added sugars, or sodium. Maintain a healthy weight Body mass index (BMI) is used to identify weight problems. It estimates body fat based on height and weight. Your health care provider can help determine your BMI and help you achieve or maintain a healthy weight. Get regular exercise Get regular exercise. This is one of the most important things you can do for your health. Most adults should: Exercise for at least 150 minutes each week. The exercise should increase your heart rate and make you sweat (moderate-intensity exercise). Do strengthening exercises at least twice a week. This is in addition to the  moderate-intensity exercise. Spend less time sitting. Even light physical activity can be beneficial. Watch cholesterol and blood lipids Have your blood tested for lipids and cholesterol at 86 years of age, then have this test every 5 years. Have your cholesterol levels checked more often if: Your lipid or cholesterol levels are high. You are older than 86 years of age. You are at high risk for heart disease. What should I know about cancer screening? Depending on your health history and family history, you may need to have cancer screening at various ages. This may include screening for: Breast cancer. Cervical cancer. Colorectal cancer. Skin cancer. Lung cancer. What should I know about heart disease, diabetes, and high blood pressure? Blood pressure and heart disease High blood pressure causes heart disease and increases the risk of stroke. This is more likely to   develop in people who have high blood pressure readings or are overweight. Have your blood pressure checked: Every 3-5 years if you are 18-39 years of age. Every year if you are 40 years old or older. Diabetes Have regular diabetes screenings. This checks your fasting blood sugar level. Have the screening done: Once every three years after age 40 if you are at a normal weight and have a low risk for diabetes. More often and at a younger age if you are overweight or have a high risk for diabetes. What should I know about preventing infection? Hepatitis B If you have a higher risk for hepatitis B, you should be screened for this virus. Talk with your health care provider to find out if you are at risk for hepatitis B infection. Hepatitis C Testing is recommended for: Everyone born from 1945 through 1965. Anyone with known risk factors for hepatitis C. Sexually transmitted infections (STIs) Get screened for STIs, including gonorrhea and chlamydia, if: You are sexually active and are younger than 86 years of age. You are  older than 86 years of age and your health care provider tells you that you are at risk for this type of infection. Your sexual activity has changed since you were last screened, and you are at increased risk for chlamydia or gonorrhea. Ask your health care provider if you are at risk. Ask your health care provider about whether you are at high risk for HIV. Your health care provider may recommend a prescription medicine to help prevent HIV infection. If you choose to take medicine to prevent HIV, you should first get tested for HIV. You should then be tested every 3 months for as long as you are taking the medicine. Pregnancy If you are about to stop having your period (premenopausal) and you may become pregnant, seek counseling before you get pregnant. Take 400 to 800 micrograms (mcg) of folic acid every day if you become pregnant. Ask for birth control (contraception) if you want to prevent pregnancy. Osteoporosis and menopause Osteoporosis is a disease in which the bones lose minerals and strength with aging. This can result in bone fractures. If you are 65 years old or older, or if you are at risk for osteoporosis and fractures, ask your health care provider if you should: Be screened for bone loss. Take a calcium or vitamin D supplement to lower your risk of fractures. Be given hormone replacement therapy (HRT) to treat symptoms of menopause. Follow these instructions at home: Alcohol use Do not drink alcohol if: Your health care provider tells you not to drink. You are pregnant, may be pregnant, or are planning to become pregnant. If you drink alcohol: Limit how much you have to: 0-1 drink a day. Know how much alcohol is in your drink. In the [place]., one drink equals one 12 oz bottle of beer (355 mL), one 5 oz glass of wine (148 mL), or one 1 oz glass of hard liquor (44 mL). Lifestyle Do not use any products that contain nicotine or tobacco. These products include cigarettes, chewing  tobacco, and vaping devices, such as e-cigarettes. If you need help quitting, ask your health care provider. Do not use street drugs. Do not share needles. Ask your health care provider for help if you need support or information about quitting drugs. General instructions Schedule regular health, dental, and eye exams. Stay current with your vaccines. Tell your health care provider if: You often feel depressed. You have ever been abused or do   not feel safe at home. Summary Adopting a healthy lifestyle and getting preventive care are important in promoting health and wellness. Follow your health care provider's instructions about healthy diet, exercising, and getting tested or screened for diseases. Follow your health care provider's instructions on monitoring your cholesterol and blood pressure. This information is not intended to replace advice given to you by your health care provider. Make sure you discuss any questions you have with your health care provider. Document Revised: 06/09/2020 Document Reviewed: 06/09/2020 Elsevier Patient Education  2023 Elsevier Inc.  

## 2021-07-23 ENCOUNTER — Encounter: Payer: Self-pay | Admitting: Family Medicine

## 2021-07-23 ENCOUNTER — Ambulatory Visit (INDEPENDENT_AMBULATORY_CARE_PROVIDER_SITE_OTHER): Payer: Medicare Other

## 2021-07-23 ENCOUNTER — Ambulatory Visit (INDEPENDENT_AMBULATORY_CARE_PROVIDER_SITE_OTHER): Payer: Medicare Other | Admitting: Family Medicine

## 2021-07-23 VITALS — BP 132/68 | HR 60 | Resp 17 | Ht 60.0 in | Wt 132.0 lb

## 2021-07-23 VITALS — BP 132/68 | HR 60 | Ht 60.0 in | Wt 132.0 lb

## 2021-07-23 DIAGNOSIS — E1121 Type 2 diabetes mellitus with diabetic nephropathy: Secondary | ICD-10-CM

## 2021-07-23 DIAGNOSIS — R350 Frequency of micturition: Secondary | ICD-10-CM

## 2021-07-23 DIAGNOSIS — R32 Unspecified urinary incontinence: Secondary | ICD-10-CM

## 2021-07-23 DIAGNOSIS — Z0000 Encounter for general adult medical examination without abnormal findings: Secondary | ICD-10-CM

## 2021-07-23 LAB — POCT GLYCOSYLATED HEMOGLOBIN (HGB A1C): Hemoglobin A1C: 5.7 % — AB (ref 4.0–5.6)

## 2021-07-23 NOTE — Patient Instructions (Addendum)
Thank you for coming to the office today.  Recent Labs    03/24/21 0943 07/23/21 1001  HGBA1C 5.9* 5.7*    Left Knee has advanced arthritis on X-ray 05/2021  I recommend the Compression Sleeve for support on the Left Knee.  Urine testing today, we can call you if we find or confirm a UTI infection.  Referral to Urology for urinary leakage.  [facility] -1st floor [place],  [place]  [address] Phone: [phone]  Your provider would like to you have your annual eye exam. Please contact your current eye doctor or here are some good options for you to contact.   [facility]   Address: [address] [place], [place] [address] Phone: [phone]  Website: [email or web address] [address], [place], [place] [address] Phone: [phone] [email or web address]  [facility]  Address: [place], [place], [place] [address] Phone: [phone]   [facility] [address] [place], [place] [address] Phone: [phone]  [facility] Address: [place], [place], [place] [address]  Phone: [phone]    Please schedule a Follow-up Appointment to: Return in about 4 months (around 11/22/2021) for 4 month follow-up DM A1c, Knee Arthritis / Urology.  If you have any other questions or concerns, please feel free to call the office or send a message through [place]. You may also schedule an earlier appointment if necessary.  Additionally, you may be receiving a survey about your experience at our office within a few days to 1 week by e-mail or mail. We value your feedback.  [name], DO [place]

## 2021-07-23 NOTE — Progress Notes (Signed)
Subjective:    Patient ID: Ellen [name], female    DOB: 1929-03-27, 88 y.o.   MRN: [id number]  Ellen [name] is a 88 y.o. female presenting on 07/23/2021 for Diabetes and Hypertension  Also has AMW with Donnie Mesa CMA today  HPI  Urinary Incontinence Leakage She admits difficulty with urinary incontinence with change of position and has dripping dribbling leakage, and she admits when tries to urinate she has difficulty getting a good urinary stream. Needs referral to Urology  Left Knee Osteoarthritis Left Knee Instability Admits issue with arthritis in knees both sides, worse on Left now it causes her to have fall or other issue. Right Knee s/p total replacement arthroplasty Left knee x-ray 05/2021 from recent fall and ED visit.  Weight Loss 2 lbs in 3 months. Admits good appetite  Type 2 Diabetes Due for A1c check She is diet controlled, not on med. Doing well       07/23/2021   10:15 AM 02/16/2021    9:50 AM 04/23/2019    9:39 AM  Depression screen PHQ 2/9  Decreased Interest 0 0 0  Down, Depressed, Hopeless 1 0 0  PHQ - 2 Score 1 0 0  Altered sleeping  0   Tired, decreased energy  0   Change in appetite  0   Feeling bad or failure about yourself   0   Trouble concentrating  0   Moving slowly or fidgety/restless  0   Suicidal thoughts  0   PHQ-9 Score  0   Difficult doing work/chores  Not difficult at all     Social History   Tobacco Use   Smoking status: Never   Smokeless tobacco: Never  Vaping Use   Vaping Use: Never used  Substance Use Topics   Alcohol use: Never   Drug use: Never    Review of Systems Per HPI unless specifically indicated above     Objective:    BP 132/68   Pulse 60   Ht 5' (1.524 m)   Wt 132 lb (59.9 kg)   SpO2 100%   BMI 25.78 kg/m   Wt Readings from Last 3 Encounters:  07/23/21 132 lb (59.9 kg)  07/23/21 132 lb (59.9 kg)  05/03/21 134 lb 7.7 oz (61 kg)    Physical Exam Vitals and nursing note reviewed.   Constitutional:      General: She is not in acute distress.    Appearance: She is well-developed. She is not diaphoretic.     Comments: Well-appearing, comfortable, cooperative  HENT:     Head: Normocephalic and atraumatic.  Eyes:     General:        Right eye: No discharge.        Left eye: No discharge.     Conjunctiva/sclera: Conjunctivae normal.  Neck:     Thyroid: No thyromegaly.  Cardiovascular:     Rate and Rhythm: Normal rate and regular rhythm.     Heart sounds: Normal heart sounds. No murmur heard. Pulmonary:     Effort: Pulmonary effort is normal. No respiratory distress.     Breath sounds: Normal breath sounds. No wheezing or rales.  Musculoskeletal:        General: Normal range of motion.     Cervical back: Normal range of motion and neck supple.  Lymphadenopathy:     Cervical: No cervical adenopathy.  Skin:    General: Skin is warm and dry.     Findings: No erythema or rash.  Neurological:     Mental Status: She is alert and oriented to person, place, and time.  Psychiatric:        Behavior: Behavior normal.     Comments: Well groomed, good eye contact, normal speech and thoughts     I have personally reviewed the radiology report from 05/03/21 L Knee X-ray.  CLINICAL DATA:  Trauma, fall   EXAM: LEFT KNEE - COMPLETE 4+ VIEW   COMPARISON:  None.   FINDINGS: No recent fracture or dislocation is seen. There is soft tissue fullness in the suprapatellar bursa suggesting small to moderate effusion. Degenerative changes are noted with bony spurs in medial, lateral and patellofemoral compartments. Chondrocalcinosis is seen in the meniscal cartilages. Smooth marginated calcification adjacent to the medial condyle of the distal femur may be residual from previous injury. Scattered arterial calcifications are seen in the soft tissues.   IMPRESSION: No recent fracture or dislocation is seen in the left knee. Small to moderate effusion. Degenerative changes  are noted.     Electronically Signed   By: Elmer Picker M.D.   On: 05/03/2021 11:13   Recent Labs    03/24/21 0943 07/23/21 1001  HGBA1C 5.9* 5.7*     Results for orders placed or performed in visit on 07/23/21  POCT HgB A1C  Result Value Ref Range   Hemoglobin A1C 5.7 (A) 4.0 - 5.6 %      Assessment & Plan:   Problem List Items Addressed This Visit     Type 2 diabetes mellitus with diabetic nephropathy, without long-term current use of insulin ([place]) - Primary   Relevant Orders   POCT HgB A1C (Completed)   Urine Microalbumin w/creat. ratio   Other Visit Diagnoses     Urinary incontinence, unspecified type       Relevant Orders   Urinalysis, Routine w reflex microscopic   Urine Culture   Ambulatory referral to Urology   Urinary frequency       Relevant Orders   Urinalysis, Routine w reflex microscopic   Urine Culture   Ambulatory referral to Urology       A1c controlled, improved at 5.7 Diet controlled diabetes  L Knee instability OA/DJD Left Knee has advanced arthritis on X-ray 05/2021 I recommend the Compression Sleeve for support on the Left Knee. Ultimately she would benefit from ortho consult to discuss options for her knee, but concern higher risk with procedural intervention. Discuss activity modification now  Urinary leakage / incontinence / dysfunction  Urine testing today, we can call you if we find or confirm a UTI infection. UA, Urine microalbumin, Urine Culture testing today pending. Given significant dysfunction, without obvious OAB symptoms will defer to Urology for further management, referral placed.  Referral to Urology for urinary leakage.  [facility] -1st floor [place],  [place]  [address] Phone: [phone]  Orders Placed This Encounter  Procedures   Urine Culture   Urinalysis, Routine w reflex microscopic   Urine Microalbumin w/creat. ratio   Ambulatory  referral to Urology    Referral Priority:   Routine    Referral Type:   Consultation    Referral Reason:   Specialty Services Required    Requested Specialty:   Urology    Number of Visits Requested:   1   POCT HgB A1C     No orders of the defined types were placed in this encounter.   Follow up plan: Return in about 4 months (  around 11/22/2021) for 4 month follow-up DM A1c, Knee Arthritis / Urology.   [name], [facility] 07/23/2021, 9:54 AM

## 2021-07-24 LAB — MICROALBUMIN / CREATININE URINE RATIO
Creatinine, Urine: 118 mg/dL (ref 20–275)
Microalb Creat Ratio: 64 ug/mg{creat} — ABNORMAL HIGH
Microalb, Ur: 7.6 mg/dL

## 2021-07-24 LAB — URINALYSIS, ROUTINE W REFLEX MICROSCOPIC
Bacteria, UA: NONE SEEN /HPF
Bilirubin Urine: NEGATIVE
Glucose, UA: NEGATIVE
Hgb urine dipstick: NEGATIVE
Hyaline Cast: NONE SEEN /LPF
Ketones, ur: NEGATIVE
Leukocytes,Ua: NEGATIVE
Nitrite: NEGATIVE
RBC / HPF: NONE SEEN /HPF (ref 0–2)
Specific Gravity, Urine: 1.015 (ref 1.001–1.035)
WBC, UA: NONE SEEN /HPF (ref 0–5)
pH: 6.5 (ref 5.0–8.0)

## 2021-07-24 LAB — MICROSCOPIC MESSAGE

## 2021-07-24 LAB — URINE CULTURE
MICRO NUMBER:: 13560683
Result:: NO GROWTH
SPECIMEN QUALITY:: ADEQUATE

## 2021-07-28 ENCOUNTER — Telehealth: Payer: Medicare Other

## 2021-07-30 ENCOUNTER — Telehealth: Payer: Self-pay | Admitting: Licensed Clinical Social Worker

## 2021-07-30 NOTE — Telephone Encounter (Signed)
    Clinical Social Work  Care Management   Phone Outreach    07/30/2021 Name: Ellen [name] MRN: [id number] DOB: November 26, 1929  Ellen [name] is a 88 y.o. year old female who is a primary care patient of [name], DO .   Reason for referral: Grief Counseling.    F/U phone call today to assess needs, progress and barriers with care plan goals.   Telephone outreach was unsuccessful. A HIPPA compliant phone message was left for the patient providing contact information and requesting a return call.   Plan:CCM LCSW will wait for return call. If no return call is received, Will route chart to Care Guide to see if patient would like to reschedule phone appointment   Review of patient status, including review of consultants reports, relevant laboratory and other test results, and collaboration with appropriate care team members and the patient's provider was performed as part of comprehensive patient evaluation and provision of care management services.    Christa See, MSW, [place] [facility] Care Management [email or web address] Phone [phone] 10:52 AM

## 2021-08-16 DIAGNOSIS — R1030 Lower abdominal pain, unspecified: Secondary | ICD-10-CM | POA: Diagnosis not present

## 2021-08-16 DIAGNOSIS — R339 Retention of urine, unspecified: Secondary | ICD-10-CM | POA: Diagnosis not present

## 2021-08-16 DIAGNOSIS — N289 Disorder of kidney and ureter, unspecified: Secondary | ICD-10-CM | POA: Diagnosis not present

## 2021-08-16 DIAGNOSIS — Z885 Allergy status to narcotic agent status: Secondary | ICD-10-CM | POA: Diagnosis not present

## 2021-08-16 DIAGNOSIS — N2889 Other specified disorders of kidney and ureter: Secondary | ICD-10-CM | POA: Diagnosis not present

## 2021-08-16 DIAGNOSIS — N390 Urinary tract infection, site not specified: Secondary | ICD-10-CM | POA: Diagnosis not present

## 2021-08-16 DIAGNOSIS — I10 Essential (primary) hypertension: Secondary | ICD-10-CM | POA: Diagnosis not present

## 2021-08-19 ENCOUNTER — Telehealth: Payer: Self-pay | Admitting: Pharmacist

## 2021-08-19 ENCOUNTER — Telehealth: Payer: Medicare Other

## 2021-08-19 NOTE — Telephone Encounter (Signed)
  Chronic Care Management   Outreach Note  08/19/2021 Name: Ellen [name] MRN: [id number] DOB: Jun 05, 1929  Referred by: [name], DO Reason for referral : No chief complaint on file.   Was unable to reach patient via telephone today and have left HIPAA compliant voicemail asking patient to return my call.    Follow Up Plan: CM Pharmacist will attempt to reach patient by telephone again in the next 30 days  Wallace Cullens, PharmD, [place] Management [phone]

## 2021-08-20 ENCOUNTER — Telehealth: Payer: Self-pay

## 2021-08-20 ENCOUNTER — Telehealth: Payer: Medicare Other

## 2021-08-20 NOTE — Telephone Encounter (Signed)
  Care Management   Follow Up Note   08/20/2021 Name: Ellen [name] MRN: [id number] DOB: 1929/11/03   Referred by: [name], DO Reason for referral : Chronic Care Management (RNCM: Follow up for Chronic Disease Management and Care Coordination Needs )   An unsuccessful telephone outreach was attempted today. The patient was referred to the case management team for assistance with care management and care coordination.   Follow Up Plan: A HIPPA compliant phone message was left for the patient providing contact information and requesting a return call.   Noreene Larsson RN, MSN, [place] Mobile: [phone]

## 2021-08-25 ENCOUNTER — Telehealth: Payer: Self-pay

## 2021-08-25 NOTE — Chronic Care Management (AMB) (Signed)
  Care Coordination Note  08/25/2021 Name: Ellen [name] MRN: [id number] DOB: 1929/06/25  Ellen [name] is a 88 y.o. year old female who is a primary care patient of [name], DO and is actively engaged with the care management team. I reached out to [employer] by phone today to assist with re-scheduling a follow up visit with the RN Case Manager  Follow up plan: Unsuccessful telephone outreach attempt made. A HIPAA compliant phone message was left for the patient providing contact information and requesting a return call.  The care management team will reach out to the patient again over the next 5 days.  If patient returns call to provider office, please advise to call  [employer]  at [place], [place], [place] [address] Direct Dial: [phone] [name]

## 2021-08-26 ENCOUNTER — Telehealth: Payer: Self-pay | Admitting: Pharmacist

## 2021-08-26 ENCOUNTER — Telehealth: Payer: Medicare Other

## 2021-08-26 NOTE — Telephone Encounter (Signed)
  Chronic Care Management   Outreach Note  08/26/2021 Name: Kavita Bartl MRN: [id number] DOB: 1929/11/20  Referred by: [name], DO Reason for referral : No chief complaint on file.   Was unable to reach patient via telephone today and have left HIPAA compliant voicemail asking patient to return my call. Outreach attempt #2.   Follow Up Plan: CM Pharmacist will collaborate with Care Guide to request assistance with rescheduling patient appointment  Wallace Cullens, PharmD, [place] Management [phone]

## 2021-08-27 ENCOUNTER — Encounter: Payer: Self-pay | Admitting: Oncology

## 2021-08-27 ENCOUNTER — Inpatient Hospital Stay: Payer: Medicare Other | Attending: Oncology | Admitting: Oncology

## 2021-08-27 NOTE — Chronic Care Management (AMB) (Signed)
  Care Coordination Note  08/27/2021 Name: Ellen [name] MRN: [id number] DOB: 03-16-1929  Ellen [name] is a 88 y.o. year old female who is a primary care patient of [name], DO and is actively engaged with the care management team. I reached out to [employer] by phone today to assist with re-scheduling a follow up visit with the RN Case Manager  Follow up plan: Unsuccessful telephone outreach attempt made. A HIPAA compliant phone message was left for the patient providing contact information and requesting a return call.  The care management team will reach out to the patient again over the next 7 days.  If patient returns call to provider office, please advise to call [place]  at [place], [place] [facility]  [place], [place] [address] Direct Dial: [phone] [email or web address]

## 2021-08-27 NOTE — Progress Notes (Deleted)
[place]  [phone] [phone]  ID: Ellen [name] OB: 09-Nov-1929  MR#: [id number]  LFY#:[id number]  Patient Care Team: [name], DO as PCP - General (Family Medicine) Vanita Ingles, RN as Case Manager (General Practice) Curley Spice Virl Diamond, RPH-CPP as Pharmacist   CHIEF COMPLAINT: Stage Ia ER/PR positive, HER-2 negative invasive carcinoma of the left upper outer quadrant breast.   INTERVAL HISTORY: Patient last evaluated in clinic in September 2021.  She returns to clinic today as an add-on with complaints of a palpable nodule in her left breast at the site of her lumpectomy scar.  She otherwise feels well.  She has no neurologic complaints.  She denies any recent fevers or illnesses.  She has a good appetite and denies weight loss.  She denies any chest pain, shortness of breath, cough, or hemoptysis.  She denies any nausea, vomiting, constipation, or diarrhea.  She has no urinary complaints.  Patient offers no further specific complaints today.  REVIEW OF SYSTEMS:   Review of Systems  Constitutional: Negative.  Negative for fever, malaise/fatigue and weight loss.  Respiratory: Negative.  Negative for cough, hemoptysis and shortness of breath.   Cardiovascular: Negative.  Negative for chest pain and leg swelling.  Gastrointestinal: Negative.  Negative for abdominal pain, blood in stool and melena.  Genitourinary: Negative.  Negative for dysuria.  Musculoskeletal: Negative.  Negative for back pain.  Skin: Negative.  Negative for rash.  Neurological: Negative.  Negative for focal weakness, weakness and headaches.  Psychiatric/Behavioral: Negative.  The patient is not nervous/anxious.     As per HPI. Otherwise, a complete review of systems is negative.  PAST MEDICAL HISTORY: Past Medical History:  Diagnosis Date   Arthritis    Cancer ([place]) 01/2018   left breast    Cataract    Chronic headaches    CKD (chronic kidney  disease)    Diabetes mellitus without complication ([place])    Diverticulitis    Family history of ovarian cancer    History of syncope    Hyperlipidemia    Hypertension    Multinodular goiter    Personal history of radiation therapy    Primary osteoarthritis of left knee    Pulmonary hypertension ([place])     PAST SURGICAL HISTORY: Past Surgical History:  Procedure Laterality Date   ABDOMINAL HYSTERECTOMY     APPENDECTOMY     BREAST BIOPSY Left 01/03/2018   left breast stereo/x clip/positive   BREAST LUMPECTOMY Left 01/12/2018   BREAST LUMPECTOMY WITH NEEDLE LOCALIZATION Left 01/12/2018   Procedure: BREAST LUMPECTOMY WITH NEEDLE LOCALIZATION;  Surgeon: Jules Husbands, MD;  Location: [facility] ORS;  Service: General;  Laterality: Left;   CHOLECYSTECTOMY     COLON SURGERY     COLOSTOMY  2008   COLOSTOMY REVERSAL  2008   EYE SURGERY     REPLACEMENT TOTAL KNEE Right 2018   SENTINEL NODE BIOPSY Left 03/07/2018   Procedure: SENTINEL NODE BIOPSY;  Surgeon: Jules Husbands, MD;  Location: [facility] ORS;  Service: General;  Laterality: Left;    FAMILY HISTORY: Family History  Problem Relation Age of Onset   Lung cancer Mother 25   Diabetes Father    Stroke Father    Hypertension Maternal Grandmother    Hypertension Maternal Uncle    Hypertension Maternal Aunt    Ovarian cancer Maternal Aunt 59   Heart disease Daughter     ADVANCED DIRECTIVES (Y/N):  N  HEALTH MAINTENANCE: Social History  Tobacco Use   Smoking status: Never   Smokeless tobacco: Never  Vaping Use   Vaping Use: Never used  Substance Use Topics   Alcohol use: Never   Drug use: Never     Colonoscopy:  PAP:  Bone density:  Lipid panel:  Allergies  Allergen Reactions   Codeine Nausea Only   Morphine And Related Nausea Only    Current Outpatient Medications  Medication Sig Dispense Refill   Accu-Chek Softclix Lancets lancets Use to check blood sugar up to twice daily 200 each 3   acetaminophen (TYLENOL) 500  MG tablet Take 1,000 mg by mouth 2 (two) times daily as needed.     amLODipine (NORVASC) 10 MG tablet Take 1 tablet (10 mg total) by mouth daily. 90 tablet 3   aspirin 81 MG chewable tablet Chew 81 mg by mouth daily.      Blood Glucose Monitoring Suppl (ACCU-CHEK GUIDE) w/Device KIT Use as directed to monitor blood sugar 1 kit 0   cloNIDine (CATAPRES) 0.1 MG tablet TAKE 1 TABLET BY MOUTH DAILY AS NEEDED FOR ELEVATED BLOOD PRESSURE GREATER THAN 180/100/HEADACHE 30 tablet 2   diclofenac Sodium (VOLTAREN) 1 % GEL Apply 2 g topically 4 (four) times daily as needed (arthritis knee, elbow). (Patient not taking: Reported on 03/06/2021) 100 g 2   gabapentin (NEURONTIN) 300 MG capsule TAKE 1 CAPSULE(300 MG) BY MOUTH IN THE MORNING AND AT BEDTIME (Patient not taking: Reported on 03/06/2021) 180 capsule 3   glucose blood (ACCU-CHEK GUIDE) test strip Use to check blood sugar up to twice daily 200 each 3   Lancets (ONETOUCH ULTRASOFT) lancets Use to check blood sugar daily, up to 2 times if need 100 each 3   lisinopril-hydrochlorothiazide (ZESTORETIC) 20-12.5 MG tablet Take 1 tablet by mouth daily. 90 tablet 3   Multiple Vitamins-Minerals (CERTAVITE SENIOR/ANTIOXIDANT) TABS Take by mouth.     pravastatin (PRAVACHOL) 20 MG tablet Take 1 tablet (20 mg total) by mouth at bedtime. 90 tablet 3   No current facility-administered medications for this visit.    OBJECTIVE: There were no vitals filed for this visit.    There is no height or weight on file to calculate BMI.    ECOG FS:0 - Asymptomatic  General: Well-developed, well-nourished, no acute distress. Eyes: Pink conjunctiva, anicteric sclera. HEENT: Normocephalic, moist mucous membranes. Breast: Palpable nodule at the site of left lumpectomy scar, likely fibrous tissue.  No other palpable nodules. Lungs: No audible wheezing or coughing. Heart: Regular rate and rhythm. Abdomen: Soft, nontender, no obvious distention. Musculoskeletal: No edema, cyanosis, or  clubbing. Neuro: Alert, answering all questions appropriately. Cranial nerves grossly intact. Skin: No rashes or petechiae noted. Psych: Normal affect.   LAB RESULTS:  Lab Results  Component Value Date   NA 140 03/24/2021   K 4.1 03/24/2021   CL 103 03/24/2021   CO2 29 03/24/2021   GLUCOSE 92 03/24/2021   BUN 21 03/24/2021   CREATININE 1.09 (H) 03/24/2021   CALCIUM 9.3 03/24/2021   PROT 7.2 03/24/2021   ALBUMIN 3.4 (L) 09/27/2019   AST 18 03/24/2021   ALT 6 03/24/2021   ALKPHOS 47 09/27/2019   BILITOT 0.5 03/24/2021   GFRNONAA 43 (L) 08/09/2020   GFRAA 55 (L) 09/27/2019    Lab Results  Component Value Date   WBC 5.0 03/24/2021   NEUTROABS 1,865 03/24/2021   HGB 11.3 (L) 03/24/2021   HCT 35.8 03/24/2021   MCV 84.8 03/24/2021   PLT 301 03/24/2021  STUDIES: No results found.   ASSESSMENT: Stage Ia ER/PR positive, HER-2 negative invasive carcinoma of the left upper outer quadrant breast.    PLAN:  1. Stage Ia ER/PR positive, HER-2 negative invasive carcinoma of the left upper outer quadrant breast: Given patient's advanced age, Oncotype DX was not ordered since patient declined chemotherapy.  Patient completed adjuvant XRT in approximately April 2020.  Patient refuses treatment with letrozole despite acknowledging the increased risk of recurrence.  No intervention is needed at this time.  Patient's last mammogram was in September 2021 and she has agreed for repeat mammogram in the next 1 to 2 weeks.  Return to clinic in 6 months for routine evaluation.   2.  Osteopenia: Patient's most recent bone mineral density on September 08, 2018 reported T score of -1.7.  Previously calcium and vitamin D supplementation was recommended.  Continue monitoring and evaluation per primary care.    I spent a total of 20 minutes reviewing chart data, face-to-face evaluation with the patient, counseling and coordination of care as detailed above.   Patient expressed understanding and was  in agreement with this plan. She also understands that She can call clinic at any time with any questions, concerns, or complaints.      Cancer Staging  Primary cancer of upper outer quadrant of left female breast [facility]) Staging form: Breast, AJCC 8th Edition - Clinical stage from 01/11/2018: Stage IA (cT1b, cN0, cM0, G2, ER+, PR+, HER2-) - Signed by Lloyd Huger, MD on 02/10/2018 Nuclear grade: G2 Histologic grading system: 3 grade system Laterality: Left   Lloyd Huger, MD   08/27/2021 6:51 AM

## 2021-08-31 NOTE — Chronic Care Management (AMB) (Signed)
  Care Coordination Note  08/31/2021 Name: Catalia Massett MRN: [id number] DOB: [birth year]  Ellen [name] is a 88 y.o. year old female who is a primary care patient of [name], DO and is actively engaged with the care management team. I reached out to [employer] by phone today to assist with re-scheduling a follow up visit with the RN Case Manager and Pharmacist  Follow up plan: Unsuccessful telephone outreach attempt made. A HIPAA compliant phone message was left for the patient providing contact information and requesting a return call.  The care management team will reach out to the patient again over the next 7 days.  If patient returns call to provider office, please advise to call [facility]  at [place], [place], [place] [address] Direct Dial: [phone] [email or web address]

## 2021-09-01 ENCOUNTER — Telehealth: Payer: Medicare Other

## 2021-09-04 NOTE — Chronic Care Management (AMB) (Signed)
  Chronic Care Management Note  09/04/2021 Name: [place] Ellen [name] MRN: [id number] DOB: [birth year]  [name] is a 88 y.o. year old female who is a primary care patient of [name], DO and is actively engaged with the care management team. I reached out to [employer] by phone today to assist with re-scheduling a follow up visit with the RN Case Manager and Pharmacist  Follow up plan: Unable to make contact on outreach attempts x 3. PCP [name], DO notified via routed documentation in medical record.   Noreene Larsson, [place], [place] [address] Direct Dial: [phone] [email or web address]

## 2021-09-08 ENCOUNTER — Ambulatory Visit: Payer: Medicare Other | Admitting: Licensed Clinical Social Worker

## 2021-09-14 NOTE — Chronic Care Management (AMB) (Signed)
Care Management Clinical Social Work Note  09/14/2021 Name: Ellen [name] MRN: [id number] DOB: 14-May-1929  Ellen [name] is a 88 y.o. year old female who is a primary care patient of Ellen Hauser, DO.  The Care Management team was consulted for assistance with chronic disease management and coordination needs.  Engaged with patient's daughter by telephone for follow up visit in response to provider referral for social work chronic care management and care coordination services  Consent to Services:  Ellen [name] was given information about Care Management services today including:  Care Management services includes personalized support from designated clinical staff supervised by her physician, including individualized plan of care and coordination with other care providers 24/7 contact phone numbers for assistance for urgent and routine care needs. The patient may stop case management services at any time by phone call to the office staff.  Patient agreed to services and consent obtained.   Assessment: Review of patient past medical history, allergies, medications, and health status, including review of relevant consultants reports was performed today as part of a comprehensive evaluation and provision of chronic care management and care coordination services.  SDOH (Social Determinants of Health) assessments and interventions performed:    Advanced Directives Status: Not addressed in this encounter.  Care Plan  Allergies  Allergen Reactions   Codeine Nausea Only   Morphine And Related Nausea Only    Outpatient Encounter Medications as of 09/08/2021  Medication Sig   Accu-Chek Softclix Lancets lancets Use to check blood sugar up to twice daily   acetaminophen (TYLENOL) 500 MG tablet Take 1,000 mg by mouth 2 (two) times daily as needed.   amLODipine (NORVASC) 10 MG tablet Take 1 tablet (10 mg total) by mouth daily.   aspirin 81 MG chewable tablet Chew 81 mg by mouth  daily.    Blood Glucose Monitoring Suppl (ACCU-CHEK GUIDE) w/Device KIT Use as directed to monitor blood sugar   cloNIDine (CATAPRES) 0.1 MG tablet TAKE 1 TABLET BY MOUTH DAILY AS NEEDED FOR ELEVATED BLOOD PRESSURE GREATER THAN 180/100/HEADACHE   diclofenac Sodium (VOLTAREN) 1 % GEL Apply 2 g topically 4 (four) times daily as needed (arthritis knee, elbow). (Patient not taking: Reported on 03/06/2021)   gabapentin (NEURONTIN) 300 MG capsule TAKE 1 CAPSULE(300 MG) BY MOUTH IN THE MORNING AND AT BEDTIME (Patient not taking: Reported on 03/06/2021)   glucose blood (ACCU-CHEK GUIDE) test strip Use to check blood sugar up to twice daily   Lancets (ONETOUCH ULTRASOFT) lancets Use to check blood sugar daily, up to 2 times if need   lisinopril-hydrochlorothiazide (ZESTORETIC) 20-12.5 MG tablet Take 1 tablet by mouth daily.   Multiple Vitamins-Minerals (CERTAVITE SENIOR/ANTIOXIDANT) TABS Take by mouth.   pravastatin (PRAVACHOL) 20 MG tablet Take 1 tablet (20 mg total) by mouth at bedtime.   No facility-administered encounter medications on file as of 09/08/2021.    Patient Active Problem List   Diagnosis Date Noted   Multinodular goiter 12/25/2019   Aortic atherosclerosis ([place]) 12/18/2019   Genetic testing 10/06/2018   Family history of ovarian cancer    Dysuria 09/24/2018   Rotator cuff arthropathy of right shoulder 06/16/2018   Rotator cuff arthropathy of left shoulder 03/13/2018   Chronic pain of both shoulders 03/13/2018   Primary cancer of upper outer quadrant of left female breast ([place]) 01/08/2018   Type 2 diabetes mellitus with diabetic nephropathy, without long-term current use of insulin ([place]) 11/10/2017   Chronic headaches 11/10/2017   Hyperlipidemia associated with type 2 diabetes  mellitus ([place]) 11/10/2017   Essential hypertension 11/10/2017   Enteritis 09/18/2017    Conditions to be addressed/monitored: HTN and DMII; Grief  Care Plan : LCSW Plan of Care  Updates made by Ellen Chesterfield, LCSW since 09/14/2021 12:00 AM     Problem: Coping Skills (General Plan of Care)      Goal: Coping Skills Enhanced Completed 09/08/2021  Start Date: 09/08/2021  This Visit's Progress: On track  Priority: Medium  Note:   Current Barriers:  Disease Management support and education needs related to Grief  CSW Clinical Goal(s):  Patient  will patient will work with SW to address concerns related to grief  through collaboration with Clinical Social Worker, provider, and care team.   Interventions: Per pt's daughter, pt is doing well. Family visits 2-3 times weekly and calls daily Pt and family are interested in obtaining a provider that specializes in gerontology to strengthen support with management of chronic health conditions LCSW will update pt's phone number and request for a Care Guide to schedule f/up appt with Ellen Land, LCSW Inter-disciplinary care team collaboration (see longitudinal plan of care) Evaluation of current treatment plan related to  self management and patient's adherence to plan as established by provider Active listening / Reflection utilized  Emotional Support Provided Verbalization of feelings encouraged    Task & activities to accomplish goals: Attend scheduled provider appts Utilize healthy coping skills Contact PCP office with any questions or concerns        Follow Up Plan: [facility] will reach out to patient to schedule appointment with Ellen [name], Ellen [name], MSW, [name] Phone [phone] 7:40 AM

## 2021-09-15 ENCOUNTER — Telehealth: Payer: Self-pay

## 2021-09-15 NOTE — Chronic Care Management (AMB) (Signed)
  Care Management   Outreach Note  09/15/2021 Name: Ellen [name] MRN: [id number] DOB: 1929-08-28  An unsuccessful telephone outreach was attempted today. The patient was referred to the case management team for assistance with care management and care coordination.   Follow Up Plan:  The care management team will reach out to the patient again over the next 7 days.  If patient returns call to provider office, please advise to call [facility] * at [phone]*  Noreene Larsson, [place], [place] [address] Direct Dial: [phone] [email or web address]

## 2021-09-23 NOTE — Chronic Care Management (AMB) (Signed)
  Care Management   Outreach Note  09/23/2021 Name: Ellen [name] MRN: [id number] DOB: Jun 20, 1929  A second unsuccessful telephone outreach was attempted today. The patient was referred to the case management team for assistance with care management and care coordination.   Follow Up Plan:  The care management team will reach out to the patient again over the next 7 days.  If patient returns call to provider office, please advise to call  [facility] * at [phone]*  Noreene Larsson, [place], [place] [address] Direct Dial: [phone] [email or web address]

## 2021-09-28 ENCOUNTER — Ambulatory Visit: Payer: Medicare Other | Admitting: Urology

## 2021-09-28 ENCOUNTER — Encounter: Payer: Self-pay | Admitting: Urology

## 2021-10-01 NOTE — Chronic Care Management (AMB) (Signed)
  Care Management   Outreach Note  10/01/2021 Name: Ellen [name] MRN: [id number] DOB: 12-28-1929  Third unsuccessful telephone outreach was attempted today. The patient was referred to the case management team for assistance with care management and care coordination. The patient's primary care provider has been notified of our unsuccessful attempts to make or maintain contact with the patient. The care management team is pleased to engage with this patient at any time in the future should he/she be interested in assistance from the care management team.   Follow Up Plan:  We have been unable to make contact with the patient for follow up. The care management team is available to follow up with the patient after provider conversation with the patient regarding recommendation for care management engagement and subsequent re-referral to the care management team.   Noreene Larsson, [place], [place] [address] Direct Dial: [phone] [email or web address]

## 2021-10-01 NOTE — Progress Notes (Signed)
Mailbox full unsuccessful outreaches

## 2021-11-23 ENCOUNTER — Ambulatory Visit: Payer: Medicare Other | Admitting: Family Medicine

## 2022-01-30 ENCOUNTER — Observation Stay: Payer: Medicare Other

## 2022-01-30 ENCOUNTER — Encounter: Payer: Self-pay | Admitting: Internal Medicine

## 2022-01-30 ENCOUNTER — Emergency Department: Payer: Medicare Other

## 2022-01-30 ENCOUNTER — Inpatient Hospital Stay
Admission: EM | Admit: 2022-01-30 | Discharge: 2022-02-01 | DRG: 065 | Disposition: A | Discharge status: Home Health | Payer: Medicare Other | Attending: Osteopathic Medicine | Admitting: Osteopathic Medicine

## 2022-01-30 ENCOUNTER — Other Ambulatory Visit: Payer: Self-pay

## 2022-01-30 DIAGNOSIS — I639 Cerebral infarction, unspecified: Principal | ICD-10-CM

## 2022-01-30 DIAGNOSIS — E1122 Type 2 diabetes mellitus with diabetic chronic kidney disease: Secondary | ICD-10-CM | POA: Diagnosis present

## 2022-01-30 DIAGNOSIS — Z923 Personal history of irradiation: Secondary | ICD-10-CM

## 2022-01-30 DIAGNOSIS — R9082 White matter disease, unspecified: Secondary | ICD-10-CM | POA: Diagnosis present

## 2022-01-30 DIAGNOSIS — G8191 Hemiplegia, unspecified affecting right dominant side: Secondary | ICD-10-CM | POA: Diagnosis present

## 2022-01-30 DIAGNOSIS — Z7982 Long term (current) use of aspirin: Secondary | ICD-10-CM

## 2022-01-30 DIAGNOSIS — R29705 NIHSS score 5: Secondary | ICD-10-CM | POA: Diagnosis present

## 2022-01-30 DIAGNOSIS — Z885 Allergy status to narcotic agent status: Secondary | ICD-10-CM

## 2022-01-30 DIAGNOSIS — I129 Hypertensive chronic kidney disease with stage 1 through stage 4 chronic kidney disease, or unspecified chronic kidney disease: Secondary | ICD-10-CM | POA: Diagnosis present

## 2022-01-30 DIAGNOSIS — Z853 Personal history of malignant neoplasm of breast: Secondary | ICD-10-CM

## 2022-01-30 DIAGNOSIS — R29810 Facial weakness: Secondary | ICD-10-CM | POA: Diagnosis present

## 2022-01-30 DIAGNOSIS — I10 Essential (primary) hypertension: Secondary | ICD-10-CM | POA: Diagnosis present

## 2022-01-30 DIAGNOSIS — I6381 Other cerebral infarction due to occlusion or stenosis of small artery: Secondary | ICD-10-CM | POA: Diagnosis not present

## 2022-01-30 DIAGNOSIS — Z8673 Personal history of transient ischemic attack (TIA), and cerebral infarction without residual deficits: Secondary | ICD-10-CM

## 2022-01-30 DIAGNOSIS — Z96651 Presence of right artificial knee joint: Secondary | ICD-10-CM | POA: Diagnosis present

## 2022-01-30 DIAGNOSIS — E785 Hyperlipidemia, unspecified: Secondary | ICD-10-CM | POA: Diagnosis present

## 2022-01-30 DIAGNOSIS — M1712 Unilateral primary osteoarthritis, left knee: Secondary | ICD-10-CM | POA: Diagnosis present

## 2022-01-30 DIAGNOSIS — Z8041 Family history of malignant neoplasm of ovary: Secondary | ICD-10-CM

## 2022-01-30 DIAGNOSIS — Z8249 Family history of ischemic heart disease and other diseases of the circulatory system: Secondary | ICD-10-CM

## 2022-01-30 DIAGNOSIS — I63231 Cerebral infarction due to unspecified occlusion or stenosis of right carotid arteries: Secondary | ICD-10-CM | POA: Diagnosis not present

## 2022-01-30 DIAGNOSIS — Z79899 Other long term (current) drug therapy: Secondary | ICD-10-CM

## 2022-01-30 DIAGNOSIS — D649 Anemia, unspecified: Secondary | ICD-10-CM | POA: Diagnosis present

## 2022-01-30 DIAGNOSIS — N1832 Chronic kidney disease, stage 3b: Secondary | ICD-10-CM | POA: Diagnosis present

## 2022-01-30 DIAGNOSIS — Z823 Family history of stroke: Secondary | ICD-10-CM

## 2022-01-30 DIAGNOSIS — Z833 Family history of diabetes mellitus: Secondary | ICD-10-CM

## 2022-01-30 DIAGNOSIS — I272 Pulmonary hypertension, unspecified: Secondary | ICD-10-CM | POA: Diagnosis present

## 2022-01-30 DIAGNOSIS — C50412 Malignant neoplasm of upper-outer quadrant of left female breast: Secondary | ICD-10-CM | POA: Diagnosis present

## 2022-01-30 DIAGNOSIS — E1169 Type 2 diabetes mellitus with other specified complication: Secondary | ICD-10-CM | POA: Diagnosis present

## 2022-01-30 DIAGNOSIS — R262 Difficulty in walking, not elsewhere classified: Secondary | ICD-10-CM | POA: Diagnosis present

## 2022-01-30 DIAGNOSIS — Z90710 Acquired absence of both cervix and uterus: Secondary | ICD-10-CM

## 2022-01-30 DIAGNOSIS — E1121 Type 2 diabetes mellitus with diabetic nephropathy: Secondary | ICD-10-CM | POA: Diagnosis present

## 2022-01-30 LAB — APTT: aPTT: 28 seconds (ref 24–36)

## 2022-01-30 LAB — CBG MONITORING, ED: Glucose-Capillary: 110 mg/dL — ABNORMAL HIGH (ref 70–99)

## 2022-01-30 LAB — COMPREHENSIVE METABOLIC PANEL
ALT: 10 U/L (ref 0–44)
AST: 21 U/L (ref 15–41)
Albumin: 3.3 g/dL — ABNORMAL LOW (ref 3.5–5.0)
Alkaline Phosphatase: 39 U/L (ref 38–126)
Anion gap: 9 (ref 5–15)
BUN: 21 mg/dL (ref 8–23)
CO2: 25 mmol/L (ref 22–32)
Calcium: 8.8 mg/dL — ABNORMAL LOW (ref 8.9–10.3)
Chloride: 103 mmol/L (ref 98–111)
Creatinine, Ser: 1.4 mg/dL — ABNORMAL HIGH (ref 0.44–1.00)
GFR, Estimated: 36 mL/min — ABNORMAL LOW (ref >60)
Glucose, Bld: 90 mg/dL (ref 70–99)
Potassium: 4.2 mmol/L (ref 3.5–5.1)
Sodium: 137 mmol/L (ref 135–145)
Total Bilirubin: 0.6 mg/dL (ref 0.3–1.2)
Total Protein: 7 g/dL (ref 6.5–8.1)

## 2022-01-30 LAB — URINALYSIS, ROUTINE W REFLEX MICROSCOPIC
Bacteria, UA: NONE SEEN
Bilirubin Urine: NEGATIVE
Glucose, UA: NEGATIVE mg/dL
Hgb urine dipstick: NEGATIVE
Ketones, ur: NEGATIVE mg/dL
Leukocytes,Ua: NEGATIVE
Nitrite: NEGATIVE
Protein, ur: NEGATIVE mg/dL
Specific Gravity, Urine: 1.006 (ref 1.005–1.030)
pH: 7 (ref 5.0–8.0)

## 2022-01-30 LAB — URINE DRUG SCREEN, QUALITATIVE (ARMC ONLY)
Amphetamines, Ur Screen: NOT DETECTED
Barbiturates, Ur Screen: NOT DETECTED
Benzodiazepine, Ur Scrn: NOT DETECTED
Cannabinoid 50 Ng, Ur ~~LOC~~: NOT DETECTED
Cocaine Metabolite,Ur ~~LOC~~: NOT DETECTED
MDMA (Ecstasy)Ur Screen: NOT DETECTED
Methadone Scn, Ur: NOT DETECTED
Opiate, Ur Screen: NOT DETECTED
Phencyclidine (PCP) Ur S: NOT DETECTED
Tricyclic, Ur Screen: NOT DETECTED

## 2022-01-30 LAB — DIFFERENTIAL
Abs Immature Granulocytes: 0.01 10*3/uL (ref 0.00–0.07)
Basophils Absolute: 0.1 10*3/uL (ref 0.0–0.1)
Basophils Relative: 1 %
Eosinophils Absolute: 0.1 10*3/uL (ref 0.0–0.5)
Eosinophils Relative: 2 %
Immature Granulocytes: 0 %
Lymphocytes Relative: 45 %
Lymphs Abs: 1.9 10*3/uL (ref 0.7–4.0)
Monocytes Absolute: 0.4 10*3/uL (ref 0.1–1.0)
Monocytes Relative: 10 %
Neutro Abs: 1.8 10*3/uL (ref 1.7–7.7)
Neutrophils Relative %: 42 %

## 2022-01-30 LAB — ETHANOL: Alcohol, Ethyl (B): <10 mg/dL (ref <10)

## 2022-01-30 LAB — PROTIME-INR
INR: 1.1 (ref 0.8–1.2)
Prothrombin Time: 14.2 seconds (ref 11.4–15.2)

## 2022-01-30 LAB — GLUCOSE, CAPILLARY: Glucose-Capillary: 83 mg/dL (ref 70–99)

## 2022-01-30 LAB — TROPONIN I (HIGH SENSITIVITY)
Troponin I (High Sensitivity): 9 ng/L (ref <18)
Troponin I (High Sensitivity): 8 ng/L (ref <18)

## 2022-01-30 LAB — CBC
HCT: 31.4 % — ABNORMAL LOW (ref 36.0–46.0)
Hemoglobin: 9.7 g/dL — ABNORMAL LOW (ref 12.0–15.0)
MCH: 26.6 pg (ref 26.0–34.0)
MCHC: 30.9 g/dL (ref 30.0–36.0)
MCV: 86 fL (ref 80.0–100.0)
Platelets: 230 10*3/uL (ref 150–400)
RBC: 3.65 MIL/uL — ABNORMAL LOW (ref 3.87–5.11)
RDW: 16.1 % — ABNORMAL HIGH (ref 11.5–15.5)
WBC: 4.4 10*3/uL (ref 4.0–10.5)
nRBC: 0 % (ref 0.0–0.2)

## 2022-01-30 MED ORDER — ACETAMINOPHEN 160 MG/5ML PO SOLN: 650.0000 mg | ORAL | Status: DC | PRN

## 2022-01-30 MED ORDER — ACETAMINOPHEN 325 MG PO TABS
650.0000 mg | ORAL_TABLET | ORAL | Status: DC | PRN
  Administered 2022-01-30 – 2022-02-01 (×3): 650 mg via ORAL
  Filled 2022-01-30 (×3): qty 2

## 2022-01-30 MED ORDER — PRAVASTATIN SODIUM 20 MG PO TABS
20.0000 mg | ORAL_TABLET | Freq: Every day | ORAL | Status: DC
  Administered 2022-01-30: 20 mg via ORAL
  Filled 2022-01-30: qty 1

## 2022-01-30 MED ORDER — ASPIRIN 81 MG PO CHEW
81.0000 mg | CHEWABLE_TABLET | Freq: Every day | ORAL | Status: DC
  Administered 2022-01-30 – 2022-02-01 (×3): 81 mg via ORAL
  Filled 2022-01-30 (×3): qty 1

## 2022-01-30 MED ORDER — ACETAMINOPHEN 650 MG RE SUPP: 650.0000 mg | RECTAL | Status: DC | PRN

## 2022-01-30 MED ORDER — STROKE: EARLY STAGES OF RECOVERY BOOK: Freq: Once | Status: DC

## 2022-01-30 MED ORDER — HYDRALAZINE HCL 20 MG/ML IJ SOLN: 5.0000 mg | Freq: Three times a day (TID) | INTRAMUSCULAR | Status: AC | PRN

## 2022-01-30 MED ORDER — SODIUM CHLORIDE 0.9% FLUSH
3.0000 mL | Freq: Once | INTRAVENOUS | Status: AC
  Administered 2022-01-30: 3 mL via INTRAVENOUS

## 2022-01-30 MED ORDER — INSULIN ASPART 100 UNIT/ML IJ SOLN: 0.0000 [IU] | Freq: Every day | INTRAMUSCULAR | Status: DC

## 2022-01-30 MED ORDER — HYDRALAZINE HCL 20 MG/ML IJ SOLN: 5.0000 mg | Freq: Three times a day (TID) | INTRAMUSCULAR | Status: DC | PRN

## 2022-01-30 MED ORDER — ADULT MULTIVITAMIN W/MINERALS CH
1.0000 | ORAL_TABLET | Freq: Every day | ORAL | Status: DC
  Administered 2022-01-30 – 2022-02-01 (×3): 1 via ORAL
  Filled 2022-01-30 (×3): qty 1

## 2022-01-30 MED ORDER — ASPIRIN 81 MG PO CHEW: 81.0000 mg | CHEWABLE_TABLET | Freq: Every day | ORAL | Status: DC

## 2022-01-30 MED ORDER — INSULIN ASPART 100 UNIT/ML IJ SOLN: 0.0000 [IU] | Freq: Three times a day (TID) | INTRAMUSCULAR | Status: DC

## 2022-01-30 MED ORDER — HEPARIN SODIUM (PORCINE) 5000 UNIT/ML IJ SOLN
5000.0000 [IU] | Freq: Three times a day (TID) | INTRAMUSCULAR | Status: DC
  Administered 2022-01-31 – 2022-02-01 (×4): 5000 [IU] via SUBCUTANEOUS
  Filled 2022-01-30 (×4): qty 1

## 2022-01-30 MED ORDER — ORAL CARE MOUTH RINSE: 15.0000 mL | OROMUCOSAL | Status: DC | PRN

## 2022-01-30 MED ORDER — SENNOSIDES-DOCUSATE SODIUM 8.6-50 MG PO TABS: 1.0000 | ORAL_TABLET | Freq: Every evening | ORAL | Status: DC | PRN

## 2022-01-30 NOTE — ED Triage Notes (Signed)
Pt comes via EMs from home with c/o right side droop. Pt states some dizziness. Pt LKW 0230 per EMs. Pt does have hx of HTN.

## 2022-01-30 NOTE — H&P (Signed)
History and Physical   Ellen [name] VOP:[id number] DOB: 10/05/1929 DOA: 01/30/2022  PCP: [name], DO  Outpatient Specialists: Dr. Grayland Ormond, medical oncology Patient coming from: Home via EMS  I have personally briefly reviewed patient's old medical records in [place].  Chief Concern: Right-sided facial droop and right sided upper and lower extremity weakness  HPI: [name] is a 88 year old female with history of neuropathy, hypertension, hyperlipidemia, non-insulin-dependent diabetes mellitus, emergency department for chief concern right-sided facial droop and right-sided upper and lower extremity weakness.  Initial vitals in the emergency department showed temperature 98.7, respiration rate of 18, heart rate of 58, blood pressure 134/53, SpO2 100% on room air.  Serum sodium is 137, potassium 4.2, chloride 103, bicarb 25, BUN of 21, serum creatinine of 1.40, EGFR 36, nonfasting glucose of 90, WBC 4.4, hemoglobin 9.7, platelets of 230.  CT of the head without contrast: Was read as focal hypoattenuation along the left lateral thalamus and posterior limb internal capsule.  Remote infarcts in the posterior left cerebellum.  Stable mild atrophy and white matter disease.  Neurology service has been consulted by EDP.  ED treatment: None ----------------------------------  At bedside patient was able to tell me her full name, her age, the current calendar year and the current location.  She reports that when she woke up this morning she noticed right-sided weakness and numbness and difficulty ambulating.  She had to drag her right foot as she was taking care of her dog.  She reports she went to bed normal at 8 PM on 01/29/2022.  She reports this is never happened before.  Social history: She lives at home alone with 1 dog.  She denies tobacco, EtOH, recreational drug use.  She is retired.  ROS: Constitutional: no weight change, no fever ENT/Mouth: no  sore throat, no rhinorrhea Eyes: no eye pain, no vision changes Cardiovascular: no chest pain, no dyspnea,  no edema, no palpitations Respiratory: no cough, no sputum, no wheezing Gastrointestinal: no nausea, no vomiting, no diarrhea, no constipation Genitourinary: no urinary incontinence, no dysuria, no hematuria Musculoskeletal: no arthralgias, no myalgias Skin: no skin lesions, no pruritus, Neuro: + weakness on right side, no loss of consciousness, no syncope Psych: no anxiety, no depression, no decrease appetite Heme/Lymph: no bruising, no bleeding  ED Course: Discussed with emergency medicine provider, patient requiring hospitalization for chief concerns of new left lateral thalamus and posterior limb internal capsule stroke.  Assessment/Plan  Principal Problem:   Right thalamic infarction [facility]) Active Problems:   Type 2 diabetes mellitus with diabetic nephropathy, without long-term current use of insulin (HCC)   Hyperlipidemia associated with type 2 diabetes mellitus ([place])   Essential hypertension   Primary cancer of upper outer quadrant of left female breast ([place])   Family history of ovarian cancer   Assessment and Plan:  * Right thalamic infarction [facility]) - Neurology has been consulted and we appreciate further recommendations - Patient is outside window for tPA/TNK - Complete echo ordered - Fasting lipid and A1c ordered in the AM - Permissive hypertension per neurology recommendations - Aspirin 81 mg daily resumed for 01/31/2022 - Frequent neuro vascular checks - PT, OT - Fall precaution  Primary cancer of upper outer quadrant of left female breast ([place]) - Continue outpatient follow-up with oncology clinic  Essential hypertension - Hold home lisinopril-hydrochlorothiazide, clonidine 0.1 daily as needed, amlodipine 10 mg daily - Hydralazine 5 mg IV every 8 hours as needed for SBP greater than 190, 36 hours  ordered - AM team to resume home antihypertensive medication  as appropriate  Hyperlipidemia associated with type 2 diabetes mellitus ([place]) - Pravastatin 20 mg nightly resumed  Type 2 diabetes mellitus with diabetic nephropathy, without long-term current use of insulin (HCC) - Insulin SSI with at bedtime coverage ordered - Goal inpatient blood glucose levels 140-180  Chart reviewed.   DVT prophylaxis: Heparin 5000 units subcutaneous every 8 hours starting on 01/31/2022 Code Status: Full code Diet: Heart healthy/carb modified Family Communication: No Disposition Plan: Pending clinical course Consults called: Neurology Admission status: Medical telemetry, observation  Past Medical History:  Diagnosis Date   Arthritis    Cancer ([place]) 01/2018   left breast    Cataract    Chronic headaches    CKD (chronic kidney disease)    Diabetes mellitus without complication ([place])    Diverticulitis    Family history of ovarian cancer    History of syncope    Hyperlipidemia    Hypertension    Multinodular goiter    Personal history of radiation therapy    Primary osteoarthritis of left knee    Pulmonary hypertension ([place])    Past Surgical History:  Procedure Laterality Date   ABDOMINAL HYSTERECTOMY     APPENDECTOMY     BREAST BIOPSY Left 01/03/2018   left breast stereo/x clip/positive   BREAST LUMPECTOMY Left 01/12/2018   BREAST LUMPECTOMY WITH NEEDLE LOCALIZATION Left 01/12/2018   Procedure: BREAST LUMPECTOMY WITH NEEDLE LOCALIZATION;  Surgeon: Jules Husbands, MD;  Location: [facility] ORS;  Service: General;  Laterality: Left;   CHOLECYSTECTOMY     COLON SURGERY     COLOSTOMY  2008   COLOSTOMY REVERSAL  2008   EYE SURGERY     REPLACEMENT TOTAL KNEE Right 2018   SENTINEL NODE BIOPSY Left 03/07/2018   Procedure: SENTINEL NODE BIOPSY;  Surgeon: Jules Husbands, MD;  Location: [facility] ORS;  Service: General;  Laterality: Left;   Social History:  reports that she has never smoked. She has never used smokeless tobacco. She reports that she does not drink  alcohol and does not use drugs.  Allergies  Allergen Reactions   Codeine Nausea Only   Morphine And Related Nausea Only   Family History  Problem Relation Age of Onset   Lung cancer Mother 77   Diabetes Father    Stroke Father    Hypertension Maternal Grandmother    Hypertension Maternal Uncle    Hypertension Maternal Aunt    Ovarian cancer Maternal Aunt 74   Heart disease Daughter    Family history: Family history reviewed and pertinent for stroke in father.  Prior to Admission medications   Medication Sig Start Date End Date Taking? Authorizing Provider  Accu-Chek Softclix Lancets lancets Use to check blood sugar up to twice daily 03/06/21   Parks Ranger, Devonne Doughty, DO  acetaminophen (TYLENOL) 500 MG tablet Take 1,000 mg by mouth 2 (two) times daily as needed.    [provider]  amLODipine (NORVASC) 10 MG tablet Take 1 tablet (10 mg total) by mouth daily. 02/16/21   Karamalegos, Devonne Doughty, DO  aspirin 81 MG chewable tablet Chew 81 mg by mouth daily.     [provider]  Blood Glucose Monitoring Suppl (ACCU-CHEK GUIDE) w/Device KIT Use as directed to monitor blood sugar 03/06/21   Karamalegos, Devonne Doughty, DO  cloNIDine (CATAPRES) 0.1 MG tablet TAKE 1 TABLET BY MOUTH DAILY AS NEEDED FOR ELEVATED BLOOD PRESSURE GREATER THAN 180/100/HEADACHE 03/06/21   Parks Ranger, Devonne Doughty,  DO  diclofenac Sodium (VOLTAREN) 1 % GEL Apply 2 g topically 4 (four) times daily as needed (arthritis knee, elbow). Patient not taking: Reported on 03/06/2021 04/09/20   [name], DO  gabapentin (NEURONTIN) 300 MG capsule TAKE 1 CAPSULE(300 MG) BY MOUTH IN THE MORNING AND AT BEDTIME Patient not taking: Reported on 03/06/2021 02/16/21   [name], DO  glucose blood (ACCU-CHEK GUIDE) test strip Use to check blood sugar up to twice daily 03/06/21   [name], DO  Lancets [facility] ULTRASOFT) lancets Use to check blood sugar daily, up to 2 times if need 06/16/18    [name], DO  lisinopril-hydrochlorothiazide (ZESTORETIC) 20-12.5 MG tablet Take 1 tablet by mouth daily. 02/16/21   Karamalegos, Devonne Doughty, DO  Multiple Vitamins-Minerals (CERTAVITE SENIOR/ANTIOXIDANT) TABS Take by mouth.    [provider]  pravastatin (PRAVACHOL) 20 MG tablet Take 1 tablet (20 mg total) by mouth at bedtime. 02/16/21   [name], DO   Physical Exam: Vitals:   01/30/22 1048 01/30/22 1300  BP: (!) 134/53 (!) 164/64  Pulse: (!) 58 65  Resp: 18 (!) 22  Temp: 98.7 F (37.1 C)   TempSrc: Oral   SpO2: 100% 100%   Constitutional: appears younger than chronological age, NAD, calm, comfortable Eyes: PERRL, lids and conjunctivae normal ENMT: Mucous membranes are moist. Posterior pharynx clear of any exudate or lesions. Age-appropriate dentition. Hearing appropriate Neck: normal, supple, no masses, no thyromegaly Respiratory: clear to auscultation bilaterally, no wheezing, no crackles. Normal respiratory effort. No accessory muscle use.  Cardiovascular: Regular rate and rhythm, no murmurs / rubs / gallops. No extremity edema. 2+ pedal pulses. No carotid bruits.  Abdomen: no tenderness, no masses palpated, no hepatosplenomegaly. Bowel sounds positive.  Musculoskeletal: no clubbing / cyanosis. No joint deformity upper and lower extremities. Good ROM, no contractures, no atrophy. Normal muscle tone.  Skin: no rashes, lesions, ulcers. No induration Neurologic: Sensation intact. Strength 5/5 in left upper and lower extremities.  Strength is decreased on right upper and lower extremities.  There is a mild left-sided mouth droop. Psychiatric: Normal judgment and insight. Alert and oriented x 3. Normal mood.   EKG: independently reviewed, showing sinus bradycardia with rate of 53, QTc 420  Chest x-ray on Admission: I personally reviewed and I agree with radiologist reading as below.  DG Chest Port 1 View  Result Date: 01/30/2022 CLINICAL DATA:   Weakness EXAM: PORTABLE CHEST 1 VIEW COMPARISON:  12/11/2019 FINDINGS: Cardiomegaly. Aortic atherosclerosis. Similar rightward deviation of the trachea related to known multinodular goiter. No focal airspace consolidation, pleural effusion, or pneumothorax. IMPRESSION: No active disease. Electronically Signed   By: Davina Poke D.O.   On: 01/30/2022 13:37   CT HEAD CODE STROKE WO CONTRAST  Result Date: 01/30/2022 CLINICAL DATA:  Code stroke. Right-sided weakness. Last known well 8 p.m. last night. EXAM: CT HEAD WITHOUT CONTRAST TECHNIQUE: Contiguous axial images were obtained from the base of the skull through the vertex without intravenous contrast. RADIATION DOSE REDUCTION: This exam was performed according to the departmental dose-optimization program which includes automated exposure control, adjustment of the mA and/or kV according to patient size and/or use of iterative reconstruction technique. COMPARISON:  CT of the head without contrast 08/09/2020 FINDINGS: Brain: Focal hypoattenuation along the lateral left thalamus and posterior limb internal capsule is new since the prior exam. Basal ganglia are intact. Insular ribbon is normal. No acute or focal cortical abnormality is present. Mild atrophy and white matter  changes are similar the prior exam. Remote infarcts are present in the posterior left cerebellum. No acute hemorrhage or mass lesion is present. The ventricles are of normal size. No significant extraaxial fluid collection is present. Vascular: Atherosclerotic calcifications present within the cavernous internal carotid arteries bilaterally, left greater than right, similar the prior exam. Calcifications extend into the left ICA terminus and proximal left M1 segment. No hyperdense vessel is present. Skull: Calvarium is intact. No focal lytic or blastic lesions are present. No significant extracranial soft tissue lesion is present. Sinuses/Orbits: The paranasal sinuses and mastoid air cells  are clear. The globes and orbits are within normal limits. ASPECTS [facility] Stroke Program Early CT Score) - Ganglionic level infarction (caudate, lentiform nuclei, internal capsule, insula, M1-M3 cortex): 6/7 - Supraganglionic infarction (M4-M6 cortex): 3/3 Total score (0-10 with 10 being normal): 9/10 IMPRESSION: 1. Focal hypoattenuation along the lateral left thalamus and posterior limb internal capsule is new since the prior exam. This is consistent with an age indeterminate infarct, acute infarct is not excluded. 2. Remote infarcts in the posterior left cerebellum. 3. Stable mild atrophy and white matter disease. This likely reflects the sequela of chronic microvascular ischemia. 4. Aspects is 9/10. The above was relayed via text pager to Dr. Kerney Elbe on 01/30/2022 at 11:15. Age Electronically Signed   By: San Morelle M.D.   On: 01/30/2022 11:16    Labs on Admission: I have personally reviewed following labs  CBC: Recent Labs  Lab 01/30/22 1129  WBC 4.4  NEUTROABS 1.8  HGB 9.7*  HCT 31.4*  MCV 86.0  PLT 165   Basic Metabolic Panel: Recent Labs  Lab 01/30/22 1129  NA 137  K 4.2  CL 103  CO2 25  GLUCOSE 90  BUN 21  CREATININE 1.40*  CALCIUM 8.8*   GFR: CrCl cannot be calculated (Unknown ideal weight.).  Liver Function Tests: Recent Labs  Lab 01/30/22 1129  AST 21  ALT 10  ALKPHOS 39  BILITOT 0.6  PROT 7.0  ALBUMIN 3.3*   Coagulation Profile: Recent Labs  Lab 01/30/22 1129  INR 1.1   Urine analysis:    Component Value Date/Time   COLORURINE STRAW (A) 01/30/2022 1358   APPEARANCEUR CLEAR (A) 01/30/2022 1358   LABSPEC 1.006 01/30/2022 1358   PHURINE 7.0 01/30/2022 1358   GLUCOSEU NEGATIVE 01/30/2022 1358   HGBUR NEGATIVE 01/30/2022 [place] 01/30/2022 1358   BILIRUBINUR Negative 02/16/2021 [place] 01/30/2022 1358   PROTEINUR NEGATIVE 01/30/2022 1358   UROBILINOGEN 0.2 02/16/2021 1036   NITRITE NEGATIVE  01/30/2022 1358   LEUKOCYTESUR NEGATIVE 01/30/2022 1358   This document was prepared using Dragon Voice Recognition software and may include unintentional dictation errors.  Dr. Tobie Poet [facility]  If 7PM-7AM, please contact overnight-coverage provider If 7AM-7PM, please contact day coverage provider [email or web address]  01/30/2022, 4:16 PM

## 2022-01-30 NOTE — ED Notes (Signed)
Edit sheet for [facility] filled out and to be faxed.

## 2022-01-30 NOTE — Assessment & Plan Note (Addendum)
-   Neurology has been consulted and we appreciate further recommendations - Patient is outside window for tPA/TNK - Complete echo ordered - Fasting lipid and A1c ordered in the AM - Permissive hypertension per neurology recommendations - Aspirin 81 mg daily resumed  - Frequent neuro vascular checks - PT, OT - Fall precaution

## 2022-01-30 NOTE — Hospital Course (Signed)
Ms. Takeela Peil is a 88 year old female with history of neuropathy, hypertension, hyperlipidemia, non-insulin-dependent diabetes mellitus, emergency department for chief concern right-sided facial droop and right-sided upper and lower extremity weakness.  Initial vitals in the emergency department showed temperature 98.7, respiration rate of 18, heart rate of 58, blood pressure 134/53, SpO2 100% on room air.  Serum sodium is 137, potassium 4.2, chloride 103, bicarb 25, BUN of 21, serum creatinine of 1.40, EGFR 36, nonfasting glucose of 90, WBC 4.4, hemoglobin 9.7, platelets of 230.  CT of the head without contrast: Was read as focal hypoattenuation along the left lateral thalamus and posterior limb internal capsule.  Remote infarcts in the posterior left cerebellum.  Stable mild atrophy and white matter disease.  Neurology service has been consulted by EDP.  ED treatment: None

## 2022-01-30 NOTE — ED Notes (Signed)
Pt taken to CT1 with RN at bedside and EMS, neuro at bedside

## 2022-01-30 NOTE — Assessment & Plan Note (Signed)
-   Hold home lisinopril-hydrochlorothiazide, clonidine 0.1 daily as needed, amlodipine 10 mg daily - Hydralazine 5 mg IV every 8 hours as needed for SBP greater than 190, 36 hours ordered - AM team to resume home antihypertensive medication as appropriate

## 2022-01-30 NOTE — Assessment & Plan Note (Signed)
-   Continue outpatient follow-up with oncology clinic °

## 2022-01-30 NOTE — Assessment & Plan Note (Addendum)
-   Pravastatin 20 mg nightly resumed

## 2022-01-30 NOTE — ED Triage Notes (Signed)
Pt arrived via ems from home due to sudden onset of right sided weakness/numbness. Pt states she woke up at 2:30am 12/30 when her dog was barking and she tried to stand and noticed her right leg was weak/numb, right sided arm weakness, and right sided facial numbness. Pt states she went to bed at 8pm 12/29 and was normal. Pt states having to drag her right foot/leg to ambulate as well as use a crutch. Pt taken to ct on arrival. Neuro at bedside.

## 2022-01-30 NOTE — Consult Note (Signed)
NEURO HOSPITALIST CONSULT NOTE   Requestig physician: Dr. Cherylann Banas  Reason for Consult: Acute onset of right hemiparesis  History obtained from:  EMS, Patient and Chart     HPI:                                                                                                                                          Ellen [name] is an 88 y.o. female with a PMHx of arthritis, left breast CA, cataract, chronic headaches, CKD, DM, diverticulitis, syncope, HLD, HTN, multinodular goiter, left knee osteoarthritis and pulmonary HTN who presents to the ED via EMS as a Code Stroke for acute onset of right hemiparesis. LKN was 8:00 PM last night, when she went to bed. At 0230 she was awakened by her dog. When she tried to get out of bed to help her dog, her right leg felt numb and difficult to control, and then gave out from under her. She decided to go back to sleep to see if the problem would go away on its own. On awakening again at 0845 the weakness continued to be present. She waited for a while and then decided to call EMS. Vitals per EMS: 178/78 >> 134/53, HR 78 with regular rhythm, O2 sats 94%, RR 18, afebrile.   On arrival to the ED she endorses right facial weakness, right arm weakness and RLE weakness with RLE sensory numbness, as well as dizziness. She states that she has no prior history of stroke.   Past Medical History:  Diagnosis Date   Arthritis    Cancer ([place]) 01/2018   left breast    Cataract    Chronic headaches    CKD (chronic kidney disease)    Diabetes mellitus without complication ([place])    Diverticulitis    Family history of ovarian cancer    History of syncope    Hyperlipidemia    Hypertension    Multinodular goiter    Personal history of radiation therapy    Primary osteoarthritis of left knee    Pulmonary hypertension ([place])     Past Surgical History:  Procedure Laterality Date   ABDOMINAL HYSTERECTOMY     APPENDECTOMY     BREAST BIOPSY Left  01/03/2018   left breast stereo/x clip/positive   BREAST LUMPECTOMY Left 01/12/2018   BREAST LUMPECTOMY WITH NEEDLE LOCALIZATION Left 01/12/2018   Procedure: BREAST LUMPECTOMY WITH NEEDLE LOCALIZATION;  Surgeon: Jules Husbands, MD;  Location: [facility] ORS;  Service: General;  Laterality: Left;   CHOLECYSTECTOMY     COLON SURGERY     COLOSTOMY  2008   COLOSTOMY REVERSAL  2008   EYE SURGERY     REPLACEMENT TOTAL KNEE Right 2018   SENTINEL NODE BIOPSY Left 03/07/2018   Procedure: SENTINEL NODE BIOPSY;  Surgeon: Jules Husbands, MD;  Location: [facility] ORS;  Service: General;  Laterality: Left;    Family History  Problem Relation Age of Onset   Lung cancer Mother 28   Diabetes Father    Stroke Father    Hypertension Maternal Grandmother    Hypertension Maternal Uncle    Hypertension Maternal Aunt    Ovarian cancer Maternal Aunt 1   Heart disease Daughter               Social History:  reports that she has never smoked. She has never used smokeless tobacco. She reports that she does not drink alcohol and does not use drugs.  Allergies  Allergen Reactions   Codeine Nausea Only   Morphine And Related Nausea Only    MEDICATIONS:                                                                                                                     No current facility-administered medications on file prior to encounter.   Current Outpatient Medications on File Prior to Encounter  Medication Sig Dispense Refill   Accu-Chek Softclix Lancets lancets Use to check blood sugar up to twice daily 200 each 3   acetaminophen (TYLENOL) 500 MG tablet Take 1,000 mg by mouth 2 (two) times daily as needed.     amLODipine (NORVASC) 10 MG tablet Take 1 tablet (10 mg total) by mouth daily. 90 tablet 3   aspirin 81 MG chewable tablet Chew 81 mg by mouth daily.      Blood Glucose Monitoring Suppl (ACCU-CHEK GUIDE) w/Device KIT Use as directed to monitor blood sugar 1 kit 0   cloNIDine (CATAPRES) 0.1 MG tablet TAKE  1 TABLET BY MOUTH DAILY AS NEEDED FOR ELEVATED BLOOD PRESSURE GREATER THAN 180/100/HEADACHE 30 tablet 2   diclofenac Sodium (VOLTAREN) 1 % GEL Apply 2 g topically 4 (four) times daily as needed (arthritis knee, elbow). (Patient not taking: Reported on 03/06/2021) 100 g 2   gabapentin (NEURONTIN) 300 MG capsule TAKE 1 CAPSULE(300 MG) BY MOUTH IN THE MORNING AND AT BEDTIME (Patient not taking: Reported on 03/06/2021) 180 capsule 3   glucose blood (ACCU-CHEK GUIDE) test strip Use to check blood sugar up to twice daily 200 each 3   Lancets (ONETOUCH ULTRASOFT) lancets Use to check blood sugar daily, up to 2 times if need 100 each 3   lisinopril-hydrochlorothiazide (ZESTORETIC) 20-12.5 MG tablet Take 1 tablet by mouth daily. 90 tablet 3   Multiple Vitamins-Minerals (CERTAVITE SENIOR/ANTIOXIDANT) TABS Take by mouth.     pravastatin (PRAVACHOL) 20 MG tablet Take 1 tablet (20 mg total) by mouth at bedtime. 90 tablet 3    ROS:  As per HPI.    There were no vitals taken for this visit.   General Examination:                                                                                                       Physical Exam  HEENT-  [place]/AT  Lungs- Respirations unlabored Extremities- No edema  Neurological Examination Mental Status: Awake and alert. Speech fluent. Naming intact. No dysarthria. Comprehension intact. Oriented x 5. Good insight.  Cranial Nerves: II: Temporal visual fields intact with no extinction to DSS. PERRL.   III,IV, VI: EOMI. No nystagmus. No ptosis.  V: Temp sensation equal bilaterally VII: No facial droop.  VIII: Hearing intact to voice IX,X: No hypophonia or hoarseness XI: Symmetric XII: Midline tongue extension Motor: RUE 4/5 proximally and distally. Pronator drift present on the right.  RLE 4/5 proximally and distally.  Sensory: Decreased  temp sensation to RUE and RLE. Positive for extinction on the right with DSS.  Deep Tendon Reflexes: Asymmetric reflexes, with lower amplitude reflexes on the right.  Plantars: Right: downgoing  Left: downgoing Cerebellar: RUE dyssinergia. FNF normal on the left.  Gait: Deferred   Lab Results: Basic Metabolic Panel: No results for input(s): "NA", "K", "CL", "CO2", "GLUCOSE", "BUN", "CREATININE", "CALCIUM", "MG", "PHOS" in the last 168 hours.  CBC: No results for input(s): "WBC", "NEUTROABS", "HGB", "HCT", "MCV", "PLT" in the last 168 hours.  Cardiac Enzymes: No results for input(s): "CKTOTAL", "CKMB", "CKMBINDEX", "TROPONINI" in the last 168 hours.  Lipid Panel: No results for input(s): "CHOL", "TRIG", "HDL", "CHOLHDL", "VLDL", "LDLCALC" in the last 168 hours.  Imaging: No results found.   Assessment: 88 year old female presenting with acute onset of right hemiparesis and RLE sensory numbness.  - Exam reveals right sided motor deficits, consistent with the CT findings documented below.  - CT head: Focal hypoattenuation along the lateral left thalamus and posterior limb internal capsule is new since the prior exam. This is consistent with an age indeterminate infarct, acute infarct is not excluded. Remote infarcts in the posterior left cerebellum. Stable mild atrophy and white matter disease.  - EKG: Sinus rhythm - Classifiable as having failed ASA monotherapy. - Stroke risk factors: History of cancer, CKD, DM, HLD and HTN - BUN 21, Cr 1.4, eGFR 36  Recommendations: - Add Plavix to ASA. Continue DAPT as outpatient.  - Stroke work up to include MRI brain, MRA head, carotid ultrasound and TTE - Cardiac telemetry - HgbA1c, fasting lipid panel - PT consult, OT consult, Speech consult - Continue pravastatin  - Modified permissive HTN protocol until 2:00 AM Sunday: Treat SBP if > 180. - IVF  - Frequent neuro checks - NPO until passes stroke swallow screen   Electronically signed:  Dr. Kerney Elbe 01/30/2022, 10:52 AM

## 2022-01-30 NOTE — ED Provider Notes (Signed)
[facility] Provider Note    Event Date/Time   First MD Initiated Contact with Patient 01/30/22 1050     (approximate)   History   Code Stroke   HPI  Ellen [name] is a 88 y.o. female with a history of hypertension, type 2 diabetes, and breast cancer who presents with right-sided weakness and facial droop that she first noticed around 2:30 AM.  The patient states that she went to bed last night around 8 PM and at that time had no symptoms.  She woke up at 230 because her dog was barking and when she tried to get up she noted weakness in the right arm and leg.  She woke up again around 9:00 and at that time noticed the weakness continued and she had facial droop as well.  She reports numbness to the right leg and the right side of her face.  She also states that she was dizzy.  She denies any headache.  She has no fever or chills, vomiting or diarrhea, chest pain, or respiratory symptoms.  I viewed the past medical records.  The patient was most recently seen in the ED in April after a fall.  She has no recent admissions.  Her most recent outpatient encounter was on 8/31 with case management.   Physical Exam   Triage Vital Signs: ED Triage Vitals  Enc Vitals Group     BP      Pulse      Resp      Temp      Temp src      SpO2      Weight      Height      Head Circumference      Peak Flow      Pain Score      Pain Loc      Pain Edu?      Excl. in [place]?     Most recent vital signs: Vitals:   01/30/22 1048  BP: (!) 134/53  Pulse: (!) 58  Resp: 18  Temp: 98.7 F (37.1 C)  SpO2: 100%     General: Alert and oriented, no distress. CV:  Good peripheral perfusion.  Resp:  Normal effort.  Abd:  No distention.  Other:  EOMI.  PERRLA.  Right facial droop.  Decreased sensation right face and right leg.  Weakness right upper and lower extremities.   ED Results / Procedures / Treatments   Labs (all labs ordered are listed, but only abnormal  results are displayed) Labs Reviewed  CBC - Abnormal; Notable for the following components:      Result Value   RBC 3.65 (*)    Hemoglobin 9.7 (*)    HCT 31.4 (*)    RDW 16.1 (*)    All other components within normal limits  COMPREHENSIVE METABOLIC PANEL - Abnormal; Notable for the following components:   Creatinine, Ser 1.40 (*)    Calcium 8.8 (*)    Albumin 3.3 (*)    GFR, Estimated 36 (*)    All other components within normal limits  PROTIME-INR  APTT  DIFFERENTIAL  ETHANOL  URINALYSIS, ROUTINE W REFLEX MICROSCOPIC  URINE DRUG SCREEN, QUALITATIVE ([facility] ONLY)  CBG MONITORING, ED  I-STAT CREATININE, ED  TROPONIN I (HIGH SENSITIVITY)  TROPONIN I (HIGH SENSITIVITY)     EKG  ED ECG REPORT I, Arta Silence, the attending physician, personally viewed and interpreted this ECG.  Date: 01/30/2022 EKG Time: 1129 Rate: 53  Rhythm: normal sinus rhythm QRS Axis: normal Intervals: normal ST/T Wave abnormalities: normal Narrative Interpretation: no evidence of acute ischemia   RADIOLOGY  CT head: I independently viewed and interpreted the images; there is no ICH.  Radiology report indicates left thalamic infarct.   PROCEDURES:  Critical Care performed: Yes, see critical care procedure note(s)  .Critical Care  Performed by: Arta Silence, MD Authorized by: Arta Silence, MD   Critical care provider statement:    Critical care time (minutes):  30   Critical care time was exclusive of:  Separately billable procedures and treating other patients   Critical care was necessary to treat or prevent imminent or life-threatening deterioration of the following conditions:  CNS failure or compromise   Critical care was time spent personally by me on the following activities:  Development of treatment plan with patient or surrogate, discussions with consultants, evaluation of patient's response to treatment, examination of patient, ordering and review of laboratory  studies, ordering and review of radiographic studies, ordering and performing treatments and interventions, pulse oximetry, re-evaluation of patient's condition, review of old charts and obtaining history from patient or surrogate   Care discussed with: admitting provider      MEDICATIONS ORDERED IN ED: Medications   stroke: early stages of recovery book (has no administration in time range)  acetaminophen (TYLENOL) tablet 650 mg (has no administration in time range)    Or  acetaminophen (TYLENOL) 160 MG/5ML solution 650 mg (has no administration in time range)    Or  acetaminophen (TYLENOL) suppository 650 mg (has no administration in time range)  senna-docusate (Senokot-S) tablet 1 tablet (has no administration in time range)  heparin injection 5,000 Units (has no administration in time range)  sodium chloride flush (NS) 0.9 % injection 3 mL (3 mLs Intravenous Given 01/30/22 1136)     IMPRESSION / MDM / [place] / ED COURSE  I reviewed the triage vital signs and the nursing notes.  88 year old female with PMH as noted above presents with right-sided facial droop as well as numbness and weakness to the right side of her body that she first noticed around 2:30 AM (last known well was around 8 PM last night).  Differential diagnosis includes, but is not limited to, acute CVA, TIA, complex migraine, intracranial hemorrhage.  Code stroke was activated in the field.  I consulted and discussed the patient with Dr. Cheral Marker from neurology; both of [place] met the patient on arrival.  We will obtain stat CT, lab workup, and reassess.  Patient's presentation is most consistent with acute presentation with potential threat to life or bodily function.  The patient is on the cardiac monitor to evaluate for evidence of arrhythmia and/or significant heart rate changes.  ----------------------------------------- 12:59 PM on 01/30/2022 -----------------------------------------  CT shows a  left thymic infarct consistent with the patient's symptoms.  Lab workup is overall unremarkable with mild anemia, no leukocytosis, normal electrolytes, and negative ethanol and troponin.  Dr. Cheral Marker does not recommend any acute intervention but does recommend admission to the hospitalist for stroke workup.  I then consulted Dr. Tobie Poet from the hospitalist service; based her discussion she agrees to admit the patient.   FINAL CLINICAL IMPRESSION(S) / ED DIAGNOSES   Final diagnoses:  Cerebrovascular accident (CVA), unspecified mechanism ([place])     Rx / DC Orders   ED Discharge Orders     None        Note:  This document was prepared using Dragon voice recognition software  and may include unintentional dictation errors.    Arta Silence, MD 01/30/22 1300

## 2022-01-30 NOTE — ED Notes (Signed)
Attempted to call patients daughter and got no answer.

## 2022-01-30 NOTE — ED Notes (Signed)
This RN spoke to pts daughter per pt request. Pts daughter states patient pressed he life alert for ems to come this am. Pts daughter states pt lives alone.

## 2022-01-30 NOTE — ED Notes (Signed)
Pt given lunch. Pt able to hold cup with right hand.

## 2022-01-30 NOTE — Assessment & Plan Note (Signed)
-   Insulin SSI with at bedtime coverage ordered ?- Goal inpatient blood glucose levels 140-180 ?

## 2022-01-31 ENCOUNTER — Observation Stay: Payer: Medicare Other

## 2022-01-31 ENCOUNTER — Observation Stay
Admit: 2022-01-31 | Discharge: 2022-01-31 | Disposition: A | Discharge status: Home / Self Care | Payer: Medicare Other | Attending: Internal Medicine | Admitting: Internal Medicine

## 2022-01-31 DIAGNOSIS — I639 Cerebral infarction, unspecified: Secondary | ICD-10-CM | POA: Diagnosis present

## 2022-01-31 DIAGNOSIS — Z833 Family history of diabetes mellitus: Secondary | ICD-10-CM | POA: Diagnosis not present

## 2022-01-31 DIAGNOSIS — Z96651 Presence of right artificial knee joint: Secondary | ICD-10-CM | POA: Diagnosis present

## 2022-01-31 DIAGNOSIS — R9082 White matter disease, unspecified: Secondary | ICD-10-CM | POA: Diagnosis present

## 2022-01-31 DIAGNOSIS — E785 Hyperlipidemia, unspecified: Secondary | ICD-10-CM | POA: Diagnosis present

## 2022-01-31 DIAGNOSIS — Z90710 Acquired absence of both cervix and uterus: Secondary | ICD-10-CM | POA: Diagnosis not present

## 2022-01-31 DIAGNOSIS — R29810 Facial weakness: Secondary | ICD-10-CM | POA: Diagnosis present

## 2022-01-31 DIAGNOSIS — Z823 Family history of stroke: Secondary | ICD-10-CM | POA: Diagnosis not present

## 2022-01-31 DIAGNOSIS — I10 Essential (primary) hypertension: Secondary | ICD-10-CM | POA: Diagnosis not present

## 2022-01-31 DIAGNOSIS — G8191 Hemiplegia, unspecified affecting right dominant side: Secondary | ICD-10-CM | POA: Diagnosis present

## 2022-01-31 DIAGNOSIS — Z923 Personal history of irradiation: Secondary | ICD-10-CM | POA: Diagnosis not present

## 2022-01-31 DIAGNOSIS — Z8041 Family history of malignant neoplasm of ovary: Secondary | ICD-10-CM | POA: Diagnosis not present

## 2022-01-31 DIAGNOSIS — Z7982 Long term (current) use of aspirin: Secondary | ICD-10-CM | POA: Diagnosis not present

## 2022-01-31 DIAGNOSIS — I272 Pulmonary hypertension, unspecified: Secondary | ICD-10-CM | POA: Diagnosis present

## 2022-01-31 DIAGNOSIS — I6381 Other cerebral infarction due to occlusion or stenosis of small artery: Secondary | ICD-10-CM | POA: Diagnosis not present

## 2022-01-31 DIAGNOSIS — I63231 Cerebral infarction due to unspecified occlusion or stenosis of right carotid arteries: Secondary | ICD-10-CM | POA: Diagnosis present

## 2022-01-31 DIAGNOSIS — Z79899 Other long term (current) drug therapy: Secondary | ICD-10-CM | POA: Diagnosis not present

## 2022-01-31 DIAGNOSIS — D649 Anemia, unspecified: Secondary | ICD-10-CM | POA: Diagnosis present

## 2022-01-31 DIAGNOSIS — E1122 Type 2 diabetes mellitus with diabetic chronic kidney disease: Secondary | ICD-10-CM | POA: Diagnosis present

## 2022-01-31 DIAGNOSIS — Z8673 Personal history of transient ischemic attack (TIA), and cerebral infarction without residual deficits: Secondary | ICD-10-CM | POA: Diagnosis not present

## 2022-01-31 DIAGNOSIS — Z8249 Family history of ischemic heart disease and other diseases of the circulatory system: Secondary | ICD-10-CM | POA: Diagnosis not present

## 2022-01-31 DIAGNOSIS — R262 Difficulty in walking, not elsewhere classified: Secondary | ICD-10-CM | POA: Diagnosis present

## 2022-01-31 DIAGNOSIS — R29705 NIHSS score 5: Secondary | ICD-10-CM | POA: Diagnosis present

## 2022-01-31 DIAGNOSIS — N1832 Chronic kidney disease, stage 3b: Secondary | ICD-10-CM | POA: Diagnosis present

## 2022-01-31 DIAGNOSIS — M1712 Unilateral primary osteoarthritis, left knee: Secondary | ICD-10-CM | POA: Diagnosis present

## 2022-01-31 DIAGNOSIS — I129 Hypertensive chronic kidney disease with stage 1 through stage 4 chronic kidney disease, or unspecified chronic kidney disease: Secondary | ICD-10-CM | POA: Diagnosis present

## 2022-01-31 DIAGNOSIS — E1169 Type 2 diabetes mellitus with other specified complication: Secondary | ICD-10-CM | POA: Diagnosis present

## 2022-01-31 LAB — LIPID PANEL
Cholesterol: 178 mg/dL (ref 0–200)
HDL: 56 mg/dL (ref >40)
LDL Cholesterol: 113 mg/dL — ABNORMAL HIGH (ref 0–99)
Total CHOL/HDL Ratio: 3.2 RATIO
Triglycerides: 45 mg/dL (ref <150)
VLDL: 9 mg/dL (ref 0–40)

## 2022-01-31 LAB — ECHOCARDIOGRAM COMPLETE
AR max vel: 1.41 cm2
AV Area VTI: 1.47 cm2
AV Area mean vel: 1.28 cm2
AV Mean grad: 12 mmHg
AV Peak grad: 20.6 mmHg
Ao pk vel: 2.27 m/s
Area-P 1/2: 3.4 cm2
Height: 60 in
P 1/2 time: 563 msec
S' Lateral: 1.8 cm

## 2022-01-31 LAB — GLUCOSE, CAPILLARY
Glucose-Capillary: 78 mg/dL (ref 70–99)
Glucose-Capillary: 69 mg/dL — ABNORMAL LOW (ref 70–99)
Glucose-Capillary: 85 mg/dL (ref 70–99)
Glucose-Capillary: 116 mg/dL — ABNORMAL HIGH (ref 70–99)

## 2022-01-31 MED ORDER — ATORVASTATIN CALCIUM 20 MG PO TABS
40.0000 mg | ORAL_TABLET | Freq: Every day | ORAL | Status: DC
  Administered 2022-01-31 – 2022-02-01 (×2): 40 mg via ORAL
  Filled 2022-01-31 (×2): qty 2

## 2022-01-31 MED ORDER — GADOBUTROL 1 MMOL/ML IV SOLN
6.0000 mL | Freq: Once | INTRAVENOUS | Status: AC | PRN
  Administered 2022-01-31: 7.5 mL via INTRAVENOUS

## 2022-01-31 MED ORDER — POTASSIUM CHLORIDE 20 MEQ PO PACK: 40.0000 meq | PACK | Freq: Once | ORAL | Status: DC

## 2022-01-31 NOTE — Evaluation (Signed)
Occupational Therapy Evaluation Patient Details Name: Ellen [name] MRN: [id number] DOB: 1930-01-12 Today's Date: 01/31/2022   History of Present Illness Pt is a 88 year old female admitted with right thalamic infarction after presenting with  Right-sided facial droop and right sided upper and lower extremity weakness; PMH significant for neuropathy, hypertension, hyperlipidemia, non-insulin-dependent diabetes mellitus   Clinical Impression   Chart reviewed, Rn cleared pt for participation in OT evaluation. Pt is greeted in bed, alert and oriented x4 however ?biographical informant. Pt reports she is MOD I-I in ADL, IADL cooks, cleans, drives (including picking up her 20 year old grand son from school each day). Pt reports fall history in the last month. Pt presents with deficits in strength, endurance, activity tolerance, RUE function, RUE sensation/perception affecting optimal ADL completion. At this time, recommend discharge to intensive rehab to facilitate return to PLOF. OT will continue to follow acutely.      Recommendations for follow up therapy are one component of a multi-disciplinary discharge planning process, led by the attending physician.  Recommendations may be updated based on patient status, additional functional criteria and insurance authorization.   Follow Up Recommendations  Acute inpatient rehab (3hours/day)     Assistance Recommended at Discharge Frequent or constant Supervision/Assistance  Patient can return home with the following A little help with walking and/or transfers;A little help with bathing/dressing/bathroom    Functional Status Assessment  Patient has had a recent decline in their functional status and demonstrates the ability to make significant improvements in function in a reasonable and predictable amount of time.  Equipment Recommendations  None recommended by OT;Other (comment) (pt has recommended equipment)    Recommendations for Other Services        Precautions / Restrictions Precautions Precautions: Fall Precaution Comments: R hemi Restrictions Weight Bearing Restrictions: No      Mobility Bed Mobility Overal bed mobility: Needs Assistance Bed Mobility: Supine to Sit     Supine to sit: Min guard, HOB elevated          Transfers Overall transfer level: Needs assistance   Transfers: Sit to/from Stand Sit to Stand: Min guard, Min assist                  Balance Overall balance assessment: Needs assistance Sitting-balance support: Feet supported Sitting balance-Leahy Scale: Good     Standing balance support: During functional activity, No upper extremity supported Standing balance-Leahy Scale: Poor                             ADL either performed or assessed with clinical judgement   ADL Overall ADL's : Needs assistance/impaired     Grooming: Wash/dry hands;Standing;Min guard Grooming Details (indicate cue type and reason): with RW sink level         Upper Body Dressing : Minimal assistance;Sitting Upper Body Dressing Details (indicate cue type and reason): doffing sweat shirt, donning gown Lower Body Dressing: Minimal assistance Lower Body Dressing Details (indicate cue type and reason): shoes seated at edge of bed Toilet Transfer: Minimal assistance;Ambulation;Moderate assistance Toilet Transfer Details (indicate cue type and reason): without RW Toileting- Clothing Manipulation and Hygiene: Minimal assistance;Sitting/lateral lean       Functional mobility during ADLs:  (MIN A with RW into bathroom, MIN-MOD A with no AD, hand off to PT for amb in hallway; R knee buckling noted)       Vision Patient Visual Report: No change from baseline;Blurring of  vision (blurring of vision at baseline, reports no different, will continue to assess)       Perception     Praxis Praxis Praxis-Other Comments: mild deficits, will continue to assess    Pertinent Vitals/Pain Pain  Assessment Pain Assessment: No/denies pain     Hand Dominance Right   Extremity/Trunk Assessment Upper Extremity Assessment Upper Extremity Assessment: RUE deficits/detail RUE Deficits / Details: AROM; shoudler flexion to  approx 3/4 full AROM, elbow WFL grossly weak 3/5 MMT, wrist WFL grossly weak 3/5 MMT; fair grip strength; steriognosis WFL RUE Sensation: decreased light touch RUE Coordination: decreased fine motor;decreased gross motor   Lower Extremity Assessment Lower Extremity Assessment: RLE deficits/detail;Defer to PT evaluation RLE Deficits / Details: heel to shin fairly equal BLE but pt with impaired coordination/neuromuscular control during stepping       Communication Communication Communication: No difficulties   Cognition Arousal/Alertness: Awake/alert Behavior During Therapy: WFL for tasks assessed/performed Overall Cognitive Status: No family/caregiver present to determine baseline cognitive functioning Area of Impairment: Safety/judgement, Awareness, Problem solving                         Safety/Judgement: Decreased awareness of deficits Awareness: Emergent Problem Solving: Requires verbal cues, Requires tactile cues General Comments: ?biographical informant     General Comments  vitals monitored, appear stable throughout    Exercises Other Exercises Other Exercises: edu re: role of rehab, discharge recommendations, home safety, falls prevention   Shoulder Instructions      Home Living Family/patient expects to be discharged to:: Private residence Living Arrangements: Alone;Other relatives (reports daugther in law lives with her) Available Help at Discharge: Family;Available PRN/intermittently Type of Home: House Home Access: Stairs to enter [employer] of Steps: 2- up the back, no rails   Home Layout: One level     Bathroom Shower/Tub: Teacher, early years/pre: Standard     Home Equipment: Rollator (4  wheels);Cane - single point;Shower seat;BSC/3in1          Prior Functioning/Environment Prior Level of Function : Independent/Modified Independent             Mobility Comments: amb with no AD per pt report, recent fall history cutting her bamboo in the backyard ADLs Comments: reports she is MOD I-I in ADL        OT Problem List: Decreased strength;Decreased activity tolerance;Decreased coordination;Decreased safety awareness;Decreased knowledge of use of DME or AE;Impaired UE functional use      OT Treatment/Interventions: Self-care/ADL training;DME and/or AE instruction;Therapeutic activities;Balance training;Therapeutic exercise;Energy conservation;Patient/family education;Neuromuscular education;Modalities;Splinting    OT Goals(Current goals can be found in the care plan section) Acute Rehab OT Goals Patient Stated Goal: return to PLOF OT Goal Formulation: With patient Time For Goal Achievement: 02/14/22 Potential to Achieve Goals: Good ADL Goals Pt Will Perform Grooming: with modified independence;sitting;standing Pt Will Perform Lower Body Dressing: with modified independence;sit to/from stand Pt Will Transfer to Toilet: with modified independence;ambulating Pt Will Perform Toileting - Clothing Manipulation and hygiene: with modified independence;sit to/from stand  OT Frequency: Min 4X/week    Co-evaluation PT/OT/SLP Co-Evaluation/Treatment: Yes Reason for Co-Treatment: For patient/therapist safety;Complexity of the patient's impairments (multi-system involvement);To address functional/ADL transfers PT goals addressed during session: Mobility/safety with mobility;Balance;Proper use of DME OT goals addressed during session: ADL's and self-care      AM-PAC OT "6 Clicks" Daily Activity     Outcome Measure Help from another person eating meals?: None Help from another person taking care  of personal grooming?: None Help from another person toileting, which includes  using toliet, bedpan, or urinal?: A Little Help from another person bathing (including washing, rinsing, drying)?: A Little Help from another person to put on and taking off regular upper body clothing?: A Little Help from another person to put on and taking off regular lower body clothing?: A Little 6 Click Score: 20   End of Session Equipment Utilized During Treatment: Gait belt;Rolling walker (2 wheels) Nurse Communication: Mobility status  Activity Tolerance: Patient tolerated treatment well Patient left:  (in care of PT amb to chair)  OT Visit Diagnosis: Unsteadiness on feet (R26.81);Muscle weakness (generalized) (M62.81);History of falling (Z91.81)                Time: 4210-3128 OT Time Calculation (min): 32 min Charges:  OT General Charges $OT Visit: 1 Visit OT Evaluation $OT Eval Moderate Complexity: 1 Mod  Shanon Payor, OTD OTR/L  01/31/22, 11:13 AM

## 2022-01-31 NOTE — Evaluation (Signed)
Physical Therapy Evaluation Patient Details Name: Ellen [name] MRN: [id number] DOB: 01/19/1930 Today's Date: 01/31/2022  History of Present Illness  Pt is a 88 y/o F admitted on 01/30/22 after presenting to the ED with c/c of R sided facial droop, RUE & RLE weakness. Imaging revealed R thalamic infarction, pt was outside of the window for tPA/TNK. PMH: neuropathy, HTN, HLD, NIDDM, primary CA of upper outer quadrant of L breast, CKD  Clinical Impression  Pt seen for PT evaluation with pt agreeable. Pt reports prior to admission she was independent without AD, cutting bamboo in her yard, tending to grass, taking care of her Husky dog. Pt endorses frequent falls 2/2 tripping over objects (shoe laces, bamboo roots, etc). On this date, pt demonstrates impaired neuromuscular control of RLE, as well as R Knee buckling during gait. Pt requires min/mod assist to ambulate without AD 2/2 strength & balance deficits but can ambulate increased distances with min assist when using RW with BUE support.  Pt does not have 24 hr support at d/c so recommend CIR level of therapies to help facilitate return to mod I level of mobility prior to d/c home alone.   Recommendations for follow up therapy are one component of a multi-disciplinary discharge planning process, led by the attending physician.  Recommendations may be updated based on patient status, additional functional criteria and insurance authorization.  Follow Up Recommendations Acute inpatient rehab (3hours/day)      Assistance Recommended at Discharge Frequent or constant Supervision/Assistance  Patient can return home with the following  A little help with walking and/or transfers;A little help with bathing/dressing/bathroom;Assist for transportation;Help with stairs or ramp for entrance;Assistance with cooking/housework    Equipment Recommendations  (TBD in next venue)  Recommendations for Other Services       Functional Status Assessment  Patient has had a recent decline in their functional status and demonstrates the ability to make significant improvements in function in a reasonable and predictable amount of time.     Precautions / Restrictions Precautions Precautions: Fall Precaution Comments: R hemi Restrictions Weight Bearing Restrictions: No      Mobility  Bed Mobility               General bed mobility comments: not tested, pt received in bathroom with OT, left in recliner    Transfers                   General transfer comment: not tested, pt received standing & left sitting (please see OT evaluation)    Ambulation/Gait Ambulation/Gait assistance: Min assist, Mod assist Gait Distance (Feet):  (5 + 25 ft without AD, 160 ft with RW) Assistive device: None, Rolling walker (2 wheels) Gait Pattern/deviations: Decreased dorsiflexion - right Gait velocity: decreased     General Gait Details: Pt demonstrates impaired stepping RLE, almost ataxic but during heel to shin testing coordination was fairly comparable to LLE. PT with frequent R Knee buckling. Provided pt with RW & pt able to ambulate with min assist with improved balance & stability.  Stairs            Wheelchair Mobility    Modified Rankin (Stroke Patients Only)       Balance Overall balance assessment: Needs assistance         Standing balance support: During functional activity, No upper extremity supported Standing balance-Leahy Scale: Poor  Pertinent Vitals/Pain Pain Assessment Pain Assessment: No/denies pain    Home Living Family/patient expects to be discharged to:: Private residence Living Arrangements: Alone;Other relatives (reports daugther in law lives with her) Available Help at Discharge: Family;Available PRN/intermittently Type of Home: House Home Access: Stairs to enter   [employer] of Steps: 2- up the back, no rails   Home Layout: One  level Home Equipment: Rollator (4 wheels);Cane - single point;Shower seat;BSC/3in1      Prior Function Prior Level of Function : Independent/Modified Independent             Mobility Comments: amb with no AD per pt report, recent fall history cutting her bamboo in the backyard ADLs Comments: reports she is MOD I-I in ADL     Hand Dominance   Dominant Hand: Right    Extremity/Trunk Assessment   Upper Extremity Assessment Upper Extremity Assessment: Overall WFL for tasks assessed    Lower Extremity Assessment Lower Extremity Assessment: RLE deficits/detail RLE Deficits / Details: heel to shin fairly equal BLE but pt with impaired coordination/neuromuscular control during stepping       Communication   Communication: No difficulties  Cognition Arousal/Alertness: Awake/alert Behavior During Therapy: WFL for tasks assessed/performed Overall Cognitive Status: Within Functional Limits for tasks assessed                                          General Comments      Exercises     Assessment/Plan    PT Assessment Patient needs continued PT services  PT Problem List Decreased strength;Decreased coordination;Decreased activity tolerance;Decreased balance;Decreased mobility;Decreased safety awareness;Decreased knowledge of use of DME       PT Treatment Interventions DME instruction;Therapeutic exercise;Gait training;Balance training;Stair training;Neuromuscular re-education;Functional mobility training;Therapeutic activities;Patient/family education    PT Goals (Current goals can be found in the Care Plan section)  Acute Rehab PT Goals Patient Stated Goal: get better, return to PLOF & home PT Goal Formulation: With patient Time For Goal Achievement: 02/14/22 Potential to Achieve Goals: Good    Frequency 7X/week     Co-evaluation PT/OT/SLP Co-Evaluation/Treatment: Yes Reason for Co-Treatment: For patient/therapist safety;Complexity of the  patient's impairments (multi-system involvement) PT goals addressed during session: Mobility/safety with mobility;Balance;Proper use of DME         AM-PAC PT "6 Clicks" Mobility  Outcome Measure Help needed turning from your back to your side while in a flat bed without using bedrails?: A Little Help needed moving from lying on your back to sitting on the side of a flat bed without using bedrails?: A Little Help needed moving to and from a bed to a chair (including a wheelchair)?: A Little Help needed standing up from a chair using your arms (e.g., wheelchair or bedside chair)?: A Little Help needed to walk in hospital room?: A Lot Help needed climbing 3-5 steps with a railing? : A Lot 6 Click Score: 16    End of Session Equipment Utilized During Treatment: Gait belt Activity Tolerance: Patient tolerated treatment well Patient left: in chair;with chair alarm set;with call bell/phone within reach;with nursing/sitter in room Nurse Communication: Mobility status PT Visit Diagnosis: Unsteadiness on feet (R26.81);Hemiplegia and hemiparesis;Muscle weakness (generalized) (M62.81);Difficulty in walking, not elsewhere classified (R26.2) Hemiplegia - Right/Left: Right Hemiplegia - dominant/non-dominant: Dominant Hemiplegia - caused by: Cerebral infarction    Time: 0867-6195 PT Time Calculation (min) (ACUTE ONLY): 12 min   Charges:  PT Evaluation $PT Eval Moderate Complexity: 1 Mod          Lavone Nian, PT, DPT 01/31/22, 11:04 AM   Waunita Schooner 01/31/2022, 11:02 AM

## 2022-01-31 NOTE — Progress Notes (Signed)
PROGRESS NOTE    Ellen [name]   EQA:[id number] DOB: 10-22-1929  DOA: 01/30/2022 Date of Service: 01/31/22 PCP: [name], DO     Brief Narrative / Hospital Course:  Ellen [name] is a 88 year old female with history of neuropathy, hypertension, hyperlipidemia, non-insulin-dependent diabetes mellitus, emergency department for chief concern right-sided facial droop and right-sided upper and lower extremity weakness. 12/30: in ED - VSS, Cr 1.4 and GFR 36, Hgb 9.7, UA no concerns, EtOH and UDS no concerns, CXR no concerns. CT of the head without contrast: focal hypoattenuation along the left lateral thalamus and posterior limb internal capsule.  Remote infarcts in the posterior left cerebellum.  Stable mild atrophy and white matter disease. Neurology service consulted by EDP. 12/31: imaging followed up (+)60m infarct lateral L thalamus. Awaiting carotid [place] Echo ok. PT/OT recs for CIR but pt would like to go home if possible. Confirmed w/ neurology will need indefinite DAPT. Increased statin potency.       Consultants:  Neurology   Procedures: none      ASSESSMENT & PLAN:   Principal Problem:   Right thalamic infarction ([facility] Active Problems:   Type 2 diabetes mellitus with diabetic nephropathy, without long-term current use of insulin ([place]   Hyperlipidemia associated with type 2 diabetes mellitus ([place]   Essential hypertension   Primary cancer of upper outer quadrant of left female breast ([facility]   Family history of ovarian cancer   Essential hypertension - Hold home lisinopril-hydrochlorothiazide, clonidine 0.1 daily as needed, amlodipine 10 mg daily - Hydralazine 5 mg IV every 8 hours as needed for SBP greater than 190, 36 hours ordered - AM team to resume home antihypertensive medication as appropriate  Right thalamic infarction ([place] outside window for tPA/TNK Fail ASA monotherapy Stroke risk factors: History of cancer, CKD, DM, HLD and HTN   DAPT w/ Plavix + ASA and continue DAPT as outpatient MRI brain --> Acute nonhemorrhagic 6 mm infarct of the lateral left thalamus. Stable white matter disease. remote L cerebellar and R occipital infarcts  MRA head --> concern for high grade stenosis in R ICS, moderate stenosis R P2 segment Carotid [place]--> pending  Echo --> EF 60-65%, moderate LVH, G1DD Lipid panel --> LDL 113 --> increase potency statin  A1C --> pending  PT/OT SLP Statin Treat SBP >180  Type 2 diabetes mellitus with diabetic nephropathy, without long-term current use of insulin (HCC) Insulin SSI with at bedtime coverage ordered Goal inpatient blood glucose levels 140-180  Primary cancer of upper outer quadrant of left female breast ([place] Continue outpatient follow-up with oncology clinic  Hyperlipidemia associated with type 2 diabetes mellitus (HCC) Pravastatin 20 mg nightly resumed    DVT prophylaxis: heparin Pertinent IV fluids/nutrition: no continuous IV fluids  Central lines / invasive devices: none  Code Status: FULL CODE   Current Admission Status: observation   TOC needs / Dispo plan: pend PT/OT may need HH  Barriers to discharge / significant pending items: results as above, neurology clearanc e             Subjective / Brief ROS:  Patient reports feeling much stronger today Denies CP/SOB.  Pain controlled.  Denies new weakness.  Tolerating diet.  Reports no concerns w/ urination/defecation.   Family Communication: called daughter 01/31/22 4:56 PM and left HIPAA compliant voicemail     Objective Findings:  Vitals:   01/30/22 2142 01/31/22 0527 01/31/22 0922 01/31/22 1246  BP: (!) 128/53 (!) 136/53 (!) 158/73 (!) 163/73  Pulse: 77 (!) 57 71 (!) 58  Resp: '18 16 17   '$ Temp: 97.8 F (36.6 C) 98 F (36.7 C) 97.8 F (36.6 C) 97.6 F (36.4 C)  TempSrc:  Oral Oral   SpO2: 95% 97% 99% 99%  Height: 5' (1.524 m)      No intake or output data in the 24 hours ending 01/31/22  1656 There were no vitals filed for this visit.  Examination:  Physical Exam Constitutional:      General: She is not in acute distress. Eyes:     Extraocular Movements: Extraocular movements intact.  Cardiovascular:     Rate and Rhythm: Normal rate and regular rhythm.  Pulmonary:     Effort: Pulmonary effort is normal.     Breath sounds: Normal breath sounds.  Abdominal:     Tenderness: There is no abdominal tenderness.  Musculoskeletal:     Cervical back: Normal range of motion.     Right lower leg: No edema.     Left lower leg: No edema.  Skin:    General: Skin is warm.  Neurological:     Mental Status: She is alert and oriented to person, place, and time.     Cranial Nerves: No cranial nerve deficit.     Motor: Weakness (RLE) present.  Psychiatric:        Mood and Affect: Mood normal.        Behavior: Behavior normal.          Scheduled Medications:    stroke: early stages of recovery book   Does not apply Once   aspirin  81 mg Oral Daily   atorvastatin  40 mg Oral Daily   heparin  5,000 Units Subcutaneous Q8H   insulin aspart  0-5 Units Subcutaneous QHS   insulin aspart  0-9 Units Subcutaneous TID WC   multivitamin with minerals  1 tablet Oral Daily    Continuous Infusions:   PRN Medications:  acetaminophen **OR** acetaminophen (TYLENOL) oral liquid 160 mg/5 mL **OR** acetaminophen, hydrALAZINE, mouth rinse, senna-docusate  Antimicrobials from admission:  Anti-infectives (From admission, onward)    None           Data Reviewed:  I have personally reviewed the following...  CBC: Recent Labs  Lab 01/30/22 1129  WBC 4.4  NEUTROABS 1.8  HGB 9.7*  HCT 31.4*  MCV 86.0  PLT 992   Basic Metabolic Panel: Recent Labs  Lab 01/30/22 1129  NA 137  K 4.2  CL 103  CO2 25  GLUCOSE 90  BUN 21  CREATININE 1.40*  CALCIUM 8.8*   GFR: CrCl cannot be calculated (Unknown ideal weight.). Liver Function Tests: Recent Labs  Lab 01/30/22 1129   AST 21  ALT 10  ALKPHOS 39  BILITOT 0.6  PROT 7.0  ALBUMIN 3.3*   No results for input(s): "LIPASE", "AMYLASE" in the last 168 hours. No results for input(s): "AMMONIA" in the last 168 hours. Coagulation Profile: Recent Labs  Lab 01/30/22 1129  INR 1.1   Cardiac Enzymes: No results for input(s): "CKTOTAL", "CKMB", "CKMBINDEX", "TROPONINI" in the last 168 hours. BNP (last 3 results) No results for input(s): "PROBNP" in the last 8760 hours. HbA1C: No results for input(s): "HGBA1C" in the last 72 hours. CBG: Recent Labs  Lab 01/30/22 1657 01/30/22 2154 01/31/22 0920 01/31/22 1244  GLUCAP 110* 83 78 69*   Lipid Profile: Recent Labs    01/31/22 0523  CHOL 178  HDL 56  LDLCALC 113*  TRIG 45  CHOLHDL 3.2   Thyroid Function Tests: No results for input(s): "TSH", "T4TOTAL", "FREET4", "T3FREE", "THYROIDAB" in the last 72 hours. Anemia Panel: No results for input(s): "VITAMINB12", "FOLATE", "FERRITIN", "TIBC", "IRON", "RETICCTPCT" in the last 72 hours. Most Recent Urinalysis On File:     Component Value Date/Time   COLORURINE STRAW (A) 01/30/2022 1358   APPEARANCEUR CLEAR (A) 01/30/2022 1358   LABSPEC 1.006 01/30/2022 1358   PHURINE 7.0 01/30/2022 1358   GLUCOSEU NEGATIVE 01/30/2022 1358   HGBUR NEGATIVE 01/30/2022 [place] 01/30/2022 1358   BILIRUBINUR Negative 02/16/2021 1036   KETONESUR NEGATIVE 01/30/2022 1358   PROTEINUR NEGATIVE 01/30/2022 1358   UROBILINOGEN 0.2 02/16/2021 1036   NITRITE NEGATIVE 01/30/2022 1358   LEUKOCYTESUR NEGATIVE 01/30/2022 1358   Sepsis Labs: '@LABRCNTIP'$ (procalcitonin:4,lacticidven:4) Microbiology: No results found for this or any previous visit (from the past 240 hour(s)).    Radiology Studies last 3 days: MR ANGIO HEAD WO CONTRAST  Result Date: 01/31/2022 CLINICAL DATA:  Right-sided facial droop. Right upper and lower extremity weakness. Left thalamic infarct. EXAM: MRA HEAD WITHOUT CONTRAST TECHNIQUE:  Angiographic images of the Circle of Willis were acquired using MRA technique without intravenous contrast. COMPARISON:  MR head without and with contrast 01/31/2022. FINDINGS: Anterior circulation: Atherosclerotic calcifications are present within the cavernous internal carotid arteries bilaterally. Signal loss in the Periophthalmic and supraclinoid right ICA is concerning for high-grade stenosis. Diffuse irregularity is present in the same segments on the left without focal stenosis. Signal loss within the cavernous ICA proximal to the genu on both sides may be artifactual. The left A1 segment is dominant. The anterior communicating artery is patent. The anterior communicating artery is dilated up to 3 mm on the left. M1 segments are normal bilaterally. MCA bifurcations are normal. ACA and MCA branch vessels are within normal limits bilaterally. Posterior circulation: Right PICA origin is visualized normal. The right vertebral artery is dominant. Fenestration is present at the vertebrobasilar junction. The basilar artery is normal. Both posterior cerebral arteries originate from basilar tip. Posterior communicating arteries contribute. Moderate stenosis is present in the right P2 segment. Distal PCA branch vessels are within normal limits bilaterally. Anatomic variants: None Other: None. IMPRESSION: 1. Signal loss in the Periophthalmic and supraclinoid right ICA is concerning for high-grade stenosis. 2. Diffuse atherosclerotic irregularity in the same segments on the left without focal stenosis. 3. Signal loss within the cavernous ICA proximal to the genu may be artifactual. 4. Moderate stenosis of the right P2 segment. 5. Dilated anterior communicating artery up to 3 mm on the left. This likely represents an infundibulum. Electronically Signed   By: San Morelle M.D.   On: 01/31/2022 12:59   MR BRAIN W WO CONTRAST  Result Date: 01/31/2022 CLINICAL DATA:  Neuro deficit, acute, stroke suspected. Acute  onset of right-sided facial droop with right upper and lower extremity weakness. EXAM: MRI HEAD WITHOUT AND WITH CONTRAST TECHNIQUE: Multiplanar, multiecho pulse sequences of the brain and surrounding structures were obtained without and with intravenous contrast. CONTRAST:  6.45m GADAVIST GADOBUTROL 1 MMOL/ML IV SOLN COMPARISON:  CT head without contrast 01/30/2022. MR head without and with contrast 12/18/2019 FINDINGS: Brain: The diffusion-weighted images confirm an acute nonhemorrhagic 6 mm infarct of the lateral left thalamus no other acute infarcts are present. T2 and FLAIR hyperintensity are associated with the acute infarct. Periventricular and T2 hyperintensities are otherwise stable. Subcortical scratched at asymmetric subcortical T2 hyperintensity in the left subinsular white matter is stable. Remote lacunar infarcts of the  left cerebellum are stable. Remote right occipital infarct is stable. Dilated perivascular spaces are present within the basal ganglia. The ventricles are proportionate to the degree of atrophy. No significant extraaxial fluid collection is present. Vascular: Flow is present in the major intracranial arteries. Skull and upper cervical spine: Degenerative changes are present the upper cervical spine, similar the prior exam. Craniocervical junction is within normal limits. Marrow signal is normal. Midline structures are unremarkable. Sinuses/Orbits: The paranasal sinuses and mastoid air cells are clear. The globes and orbits are within normal limits. IMPRESSION: 1. Acute nonhemorrhagic 6 mm infarct of the lateral left thalamus. 2. Stable atrophy and white matter disease. This likely reflects the sequela of chronic microvascular ischemia. 3. Remote infarcts of the left cerebellum and right occipital lobe are stable. Electronically Signed   By: San Morelle M.D.   On: 01/31/2022 12:52   ECHOCARDIOGRAM COMPLETE  Result Date: 01/31/2022    ECHOCARDIOGRAM REPORT   Patient Name:    Ellen [name] Date of Exam: 01/31/2022 Medical Rec #:  [id number]      Height:       60.0 in Accession #:    [id number]     Weight:       132.0 lb Date of Birth:  09-07-1929     BSA:          1.564 m Patient Age:    73 years       BP:           136/53 mmHg Patient Gender: F              HR:           70 bpm. Exam Location:  [facility] Procedure: 2D Echo, Color Doppler and Cardiac Doppler Indications:     I63.9 Stroke  History:         Patient has no prior history of Echocardiogram examinations.                  CKD; Risk Factors:Hypertension, Diabetes and Dyslipidemia.  Sonographer:     Charmayne Sheer Referring Phys:  [id number] AMY N COX Diagnosing Phys: Isaias Cowman MD IMPRESSIONS  1. Left ventricular ejection fraction, by estimation, is 60 to 65%. The left ventricle has normal function. The left ventricle has no regional wall motion abnormalities. There is moderate left ventricular hypertrophy. Left ventricular diastolic parameters are consistent with Grade I diastolic dysfunction (impaired relaxation).  2. Right ventricular systolic function is normal. The right ventricular size is normal.  3. The mitral valve is normal in structure. No evidence of mitral valve regurgitation. No evidence of mitral stenosis.  4. The aortic valve is calcified. Aortic valve regurgitation is mild to moderate. Mild to moderate aortic valve stenosis.  5. The inferior vena cava is normal in size with greater than 50% respiratory variability, suggesting right atrial pressure of 3 mmHg. FINDINGS  Left Ventricle: Left ventricular ejection fraction, by estimation, is 60 to 65%. The left ventricle has normal function. The left ventricle has no regional wall motion abnormalities. The left ventricular internal cavity size was normal in size. There is  moderate left ventricular hypertrophy. Left ventricular diastolic parameters are consistent with Grade I diastolic dysfunction (impaired relaxation). Right Ventricle: The right ventricular size is  normal. No increase in right ventricular wall thickness. Right ventricular systolic function is normal. Left Atrium: Left atrial size was normal in size. Right Atrium: Right atrial size was normal in size. Pericardium: There is no evidence of pericardial effusion. Mitral  Valve: The mitral valve is normal in structure. No evidence of mitral valve regurgitation. No evidence of mitral valve stenosis. Tricuspid Valve: The tricuspid valve is normal in structure. Tricuspid valve regurgitation is not demonstrated. No evidence of tricuspid stenosis. Aortic Valve: The aortic valve is calcified. Aortic valve regurgitation is mild to moderate. Aortic regurgitation PHT measures 563 msec. Mild to moderate aortic stenosis is present. Aortic valve mean gradient measures 12.0 mmHg. Aortic valve peak gradient measures 20.6 mmHg. Aortic valve area, by VTI measures 1.47 cm. Pulmonic Valve: The pulmonic valve was normal in structure. Pulmonic valve regurgitation is not visualized. No evidence of pulmonic stenosis. Aorta: The aortic root is normal in size and structure. Venous: The inferior vena cava is normal in size with greater than 50% respiratory variability, suggesting right atrial pressure of 3 mmHg. IAS/Shunts: No atrial level shunt detected by color flow Doppler.  LEFT VENTRICLE PLAX 2D LVIDd:         2.60 cm   Diastology LVIDs:         1.80 cm   LV e' medial:    4.03 cm/s LV PW:         1.40 cm   LV E/e' medial:  16.4 LV IVS:        1.10 cm   LV e' lateral:   8.59 cm/s LVOT diam:     1.90 cm   LV E/e' lateral: 7.7 LV SV:         68 LV SV Index:   44 LVOT Area:     2.84 cm  RIGHT VENTRICLE RV Basal diam:  2.50 cm RV S prime:     21.40 cm/s LEFT ATRIUM           Index        RIGHT ATRIUM           Index LA diam:      3.40 cm 2.17 cm/m   RA Area:     10.20 cm LA Vol (A2C): 44.7 ml 28.57 ml/m  RA Volume:   16.60 ml  10.61 ml/m LA Vol (A4C): 31.1 ml 19.88 ml/m  AORTIC VALVE                     PULMONIC VALVE AV Area (Vmax):     1.41 cm      PV Vmax:          1.09 m/s AV Area (Vmean):   1.28 cm      PV Vmean:         72.900 cm/s AV Area (VTI):     1.47 cm      PV VTI:           0.210 m AV Vmax:           227.00 cm/s   PV Peak grad:     4.8 mmHg AV Vmean:          168.000 cm/s  PV Mean grad:     2.0 mmHg AV VTI:            0.465 m       PR End Diast Vel: 2.32 msec AV Peak Grad:      20.6 mmHg AV Mean Grad:      12.0 mmHg LVOT Vmax:         113.00 cm/s LVOT Vmean:        76.100 cm/s LVOT VTI:          0.241 m  LVOT/AV VTI ratio: 0.52 AI PHT:            563 msec  AORTA Ao Root diam: 2.80 cm MITRAL VALVE               TRICUSPID VALVE MV Area (PHT): 3.40 cm    TR Peak grad:   28.7 mmHg MV Decel Time: 223 msec    TR Vmax:        268.00 cm/s MV E velocity: 66.00 cm/s MV A velocity: 94.30 cm/s  SHUNTS MV E/A ratio:  0.70        Systemic VTI:  0.24 m                            Systemic Diam: 1.90 cm Isaias Cowman MD Electronically signed by Isaias Cowman MD Signature Date/Time: 01/31/2022/12:27:27 PM    Final    DG Chest Port 1 View  Result Date: 01/30/2022 CLINICAL DATA:  Weakness EXAM: PORTABLE CHEST 1 VIEW COMPARISON:  12/11/2019 FINDINGS: Cardiomegaly. Aortic atherosclerosis. Similar rightward deviation of the trachea related to known multinodular goiter. No focal airspace consolidation, pleural effusion, or pneumothorax. IMPRESSION: No active disease. Electronically Signed   By: Davina Poke D.O.   On: 01/30/2022 13:37   CT HEAD CODE STROKE WO CONTRAST  Result Date: 01/30/2022 CLINICAL DATA:  Code stroke. Right-sided weakness. Last known well 8 p.m. last night. EXAM: CT HEAD WITHOUT CONTRAST TECHNIQUE: Contiguous axial images were obtained from the base of the skull through the vertex without intravenous contrast. RADIATION DOSE REDUCTION: This exam was performed according to the departmental dose-optimization program which includes automated exposure control, adjustment of the mA and/or kV according to patient  size and/or use of iterative reconstruction technique. COMPARISON:  CT of the head without contrast 08/09/2020 FINDINGS: Brain: Focal hypoattenuation along the lateral left thalamus and posterior limb internal capsule is new since the prior exam. Basal ganglia are intact. Insular ribbon is normal. No acute or focal cortical abnormality is present. Mild atrophy and white matter changes are similar the prior exam. Remote infarcts are present in the posterior left cerebellum. No acute hemorrhage or mass lesion is present. The ventricles are of normal size. No significant extraaxial fluid collection is present. Vascular: Atherosclerotic calcifications present within the cavernous internal carotid arteries bilaterally, left greater than right, similar the prior exam. Calcifications extend into the left ICA terminus and proximal left M1 segment. No hyperdense vessel is present. Skull: Calvarium is intact. No focal lytic or blastic lesions are present. No significant extracranial soft tissue lesion is present. Sinuses/Orbits: The paranasal sinuses and mastoid air cells are clear. The globes and orbits are within normal limits. ASPECTS [facility] Stroke Program Early CT Score) - Ganglionic level infarction (caudate, lentiform nuclei, internal capsule, insula, M1-M3 cortex): 6/7 - Supraganglionic infarction (M4-M6 cortex): 3/3 Total score (0-10 with 10 being normal): 9/10 IMPRESSION: 1. Focal hypoattenuation along the lateral left thalamus and posterior limb internal capsule is new since the prior exam. This is consistent with an age indeterminate infarct, acute infarct is not excluded. 2. Remote infarcts in the posterior left cerebellum. 3. Stable mild atrophy and white matter disease. This likely reflects the sequela of chronic microvascular ischemia. 4. Aspects is 9/10. The above was relayed via text pager to Dr. Kerney Elbe on 01/30/2022 at 11:15. Age Electronically Signed   By: San Morelle M.D.   On: 01/30/2022  11:16  LOS: 0 days      Emeterio Reeve, DO [facility] 01/31/2022, 4:56 PM    Dictation software may have been used to generate the above note. Typos may occur and escape review in typed/dictated notes. Please contact Dr Sheppard Coil directly for clarity if needed.  Staff may message me via secure chat in [place]  but this may not receive an immediate response,  please page me for urgent matters!  If 7PM-7AM, please contact night coverage [email or web address]

## 2022-01-31 NOTE — Progress Notes (Signed)
Inpatient Rehab Admissions Coordinator Note:   Per therapy patient was screened for CIR candidacy by Galia Rahm Danford Bad, CCC-SLP. At this time, pt appears to be a potential candidate for CIR. I will place an order for rehab consult for full assessment, per our protocol.  Please contact me any with questions.Gayland Curry, [place], [place] Admissions Coordinator [phone] 01/31/22 5:03 PM

## 2022-01-31 NOTE — Progress Notes (Signed)
*  PRELIMINARY RESULTS* Echocardiogram 2D Echocardiogram has been performed.  Ellen [name] 01/31/2022, 11:02 AM

## 2022-02-01 ENCOUNTER — Other Ambulatory Visit (HOSPITAL_BASED_OUTPATIENT_CLINIC_OR_DEPARTMENT_OTHER): Payer: Self-pay | Admitting: Osteopathic Medicine

## 2022-02-01 DIAGNOSIS — C50412 Malignant neoplasm of upper-outer quadrant of left female breast: Secondary | ICD-10-CM

## 2022-02-01 DIAGNOSIS — E1169 Type 2 diabetes mellitus with other specified complication: Secondary | ICD-10-CM

## 2022-02-01 DIAGNOSIS — I6381 Other cerebral infarction due to occlusion or stenosis of small artery: Secondary | ICD-10-CM | POA: Diagnosis not present

## 2022-02-01 DIAGNOSIS — E1121 Type 2 diabetes mellitus with diabetic nephropathy: Secondary | ICD-10-CM

## 2022-02-01 DIAGNOSIS — E785 Hyperlipidemia, unspecified: Secondary | ICD-10-CM

## 2022-02-01 DIAGNOSIS — Z8041 Family history of malignant neoplasm of ovary: Secondary | ICD-10-CM

## 2022-02-01 DIAGNOSIS — I10 Essential (primary) hypertension: Secondary | ICD-10-CM | POA: Diagnosis not present

## 2022-02-01 LAB — GLUCOSE, CAPILLARY: Glucose-Capillary: 86 mg/dL (ref 70–99)

## 2022-02-01 MED ORDER — ATORVASTATIN CALCIUM 40 MG PO TABS: 40.0000 mg | ORAL_TABLET | Freq: Every day | ORAL | 0 refills | Status: DC

## 2022-02-01 MED ORDER — CLOPIDOGREL BISULFATE 75 MG PO TABS: 75.0000 mg | ORAL_TABLET | Freq: Every day | ORAL | 0 refills | Status: DC

## 2022-02-01 NOTE — Discharge Summary (Signed)
Physician Discharge Summary   Patient: Ellen [name] MRN: [id number]  DOB: 1929/07/26   Admit:     Date of Admission: 01/30/2022 Admitted from: [facility]   Discharge: Date of discharge: 02/01/22 Disposition: Home health Condition at discharge: good  CODE STATUS: FULL CODE      Discharge Physician: Emeterio Reeve, DO [facility]     PCP: [name], DO  Recommendations for Outpatient Follow-up:  Follow up with PCP [name], DO in 1-2 weeks Please obtain labs/tests: CBC, BMP in 1-2 weeks. Lipids, hepatic panel and consider CK in 4-6 weeks.  Changes to meds: will be on indefinite DAPT w/ ASA + Plavix, changed pravastatin to atorvastatin. Stopped amlodipine, BP fairly well controlled, restart as needed, we did continue lisinopril-HCT on d/c home  Please follow up on the following pending results: none   Discharge Instructions     Diet - low sodium heart healthy   Complete by: As directed    Increase activity slowly   Complete by: As directed          Discharge Diagnoses: Principal Problem:   Right thalamic infarction [facility]) Active Problems:   Type 2 diabetes mellitus with diabetic nephropathy, without long-term current use of insulin ([place])   Hyperlipidemia associated with type 2 diabetes mellitus ([place])   Essential hypertension   Primary cancer of upper outer quadrant of left female breast ([place])   Family history of ovarian cancer       Hospital Course: [name] is a 89 year old female with history of neuropathy, hypertension, hyperlipidemia, non-insulin-dependent diabetes mellitus, emergency department for chief concern right-sided facial droop and right-sided upper and lower extremity weakness. 12/30: in ED - VSS, Cr 1.4 and GFR 36, Hgb 9.7, UA no concerns, EtOH and UDS no concerns, CXR no concerns. CT of the head without contrast: focal hypoattenuation along the left lateral thalamus and posterior limb internal  capsule.  Remote infarcts in the posterior left cerebellum.  Stable mild atrophy and white matter disease. Neurology service consulted by EDP. 12/31: imaging followed up (+)10m infarct lateral L thalamus. Awaiting carotid [place] Echo ok. PT/OT recs for CIR but pt would like to go home if possible. Confirmed w/ neurology will need indefinite DAPT. Increased statin potency.  01/01: carotid [place]<50% stenosis. Pt declines inpatient rehab but given her improvement in strength she is considered safe for discharge.       Consultants:  Neurology   Procedures: none      ASSESSMENT & PLAN:   Principal Problem:   Right thalamic infarction ([facility] Active Problems:   Type 2 diabetes mellitus with diabetic nephropathy, without long-term current use of insulin ([place]   Hyperlipidemia associated with type 2 diabetes mellitus ([facility]   Essential hypertension   Primary cancer of upper outer quadrant of left female breast ([place]   Family history of ovarian cancer  Right thalamic infarction ([place] outside window for tPA/TNK Fail ASA monotherapy Stroke risk factors: History of cancer, CKD, DM, HLD and HTN  DAPT w/ Plavix + ASA and continue DAPT as outpatient MRI brain --> Acute nonhemorrhagic 6 mm infarct of the lateral left thalamus. Stable white matter disease. remote L cerebellar and R occipital infarcts  MRA head --> concern for high grade stenosis in R ICS, moderate stenosis R P2 segment Carotid [place]--> <50% stenosis  Echo --> EF 60-65%, moderate LVH, G1DD Lipid panel --> LDL 113 --> increase potency statin   Essential hypertension Restarted home lisinopril-hydrochlorothiazide Restarted clonidine  0.1 daily as needed Stopped/held amlodipine 10 mg daily  Type 2 diabetes mellitus with diabetic nephropathy, without long-term current use of insulin (HCC) Insulin SSI with at bedtime coverage ordered Goal inpatient blood glucose levels 140-180  Primary cancer of upper outer quadrant of left female  breast [facility]) Continue outpatient follow-up with oncology clinic  Hyperlipidemia associated with type 2 diabetes mellitus (HCC) Pravastatin 20 mg nightly stopped, started high potency statin Lipitor              Discharge Instructions  Allergies as of 02/01/2022       Reactions   Codeine Nausea Only   Morphine And Related Nausea Only        Medication List     STOP taking these medications    amLODipine 10 MG tablet Commonly known as: NORVASC   pravastatin 20 MG tablet Commonly known as: PRAVACHOL       TAKE these medications    Accu-Chek Guide test strip Generic drug: glucose blood Use to check blood sugar up to twice daily   Accu-Chek Guide w/Device Kit Use as directed to monitor blood sugar   acetaminophen 500 MG tablet Commonly known as: TYLENOL Take 1,000 mg by mouth 2 (two) times daily as needed for mild pain or moderate pain.   aspirin 81 MG chewable tablet Chew 81 mg by mouth daily.   atorvastatin 40 MG tablet Commonly known as: LIPITOR Take 1 tablet (40 mg total) by mouth daily. Start taking on: February 02, 5033   CertaVite Senior/Antioxidant Tabs Take 1 tablet by mouth daily.   cloNIDine 0.1 MG tablet Commonly known as: CATAPRES TAKE 1 TABLET BY MOUTH DAILY AS NEEDED FOR ELEVATED BLOOD PRESSURE GREATER THAN 180/100/HEADACHE   clopidogrel 75 MG tablet Commonly known as: Plavix Take 1 tablet (75 mg total) by mouth daily.   lisinopril-hydrochlorothiazide 20-12.5 MG tablet Commonly known as: ZESTORETIC Take 1 tablet by mouth daily.   onetouch ultrasoft lancets Use to check blood sugar daily, up to 2 times if need   Accu-Chek Softclix Lancets lancets Use to check blood sugar up to twice daily          Allergies  Allergen Reactions   Codeine Nausea Only   Morphine And Related Nausea Only     Subjective: pt feeling well today, walking to the bathroom by herself, would like to go home if possible   Discharge Exam: BP  (!) 159/61 (BP Location: Left Arm)   Pulse 64   Temp 98 F (36.7 C) (Oral)   Resp 18   Ht 5' (1.524 m)   SpO2 100%   BMI 25.78 kg/m  General: Pt is alert, awake, not in acute distress Cardiovascular: RRR, S1/S2 +murmur, no rubs, no gallops Respiratory: CTA bilaterally, no wheezing, no rhonchi Abdominal: Soft, NT, ND, bowel sounds + Neuro/MSK:  no cranial nerve deficit on limited exam, speech normal, coordination normal, 5/5 strength symmetrical bilateral UE, 5/5 in LE to hip flexion and knee extension but slightly weaker on R      The results of significant diagnostics from this hospitalization (including imaging, microbiology, ancillary and laboratory) are listed below for reference.     Microbiology: No results found for this or any previous visit (from the past 240 hour(s)).   Labs: BNP (last 3 results) No results for input(s): "BNP" in the last 8760 hours. Basic Metabolic Panel: Recent Labs  Lab 01/30/22 1129  NA 137  K 4.2  CL 103  CO2 25  GLUCOSE 90  BUN 21  CREATININE 1.40*  CALCIUM 8.8*   Liver Function Tests: Recent Labs  Lab 01/30/22 1129  AST 21  ALT 10  ALKPHOS 39  BILITOT 0.6  PROT 7.0  ALBUMIN 3.3*   No results for input(s): "LIPASE", "AMYLASE" in the last 168 hours. No results for input(s): "AMMONIA" in the last 168 hours. CBC: Recent Labs  Lab 01/30/22 1129  WBC 4.4  NEUTROABS 1.8  HGB 9.7*  HCT 31.4*  MCV 86.0  PLT 230   Cardiac Enzymes: No results for input(s): "CKTOTAL", "CKMB", "CKMBINDEX", "TROPONINI" in the last 168 hours. BNP: Invalid input(s): "POCBNP" CBG: Recent Labs  Lab 01/31/22 0920 01/31/22 1244 01/31/22 1748 01/31/22 2112 02/01/22 0741  GLUCAP 78 69* 85 116* 86   D-Dimer No results for input(s): "DDIMER" in the last 72 hours. Hgb A1c No results for input(s): "HGBA1C" in the last 72 hours. Lipid Profile Recent Labs    01/31/22 0523  CHOL 178  HDL 56  LDLCALC 113*  TRIG 45  CHOLHDL 3.2   Thyroid  function studies No results for input(s): "TSH", "T4TOTAL", "T3FREE", "THYROIDAB" in the last 72 hours.  Invalid input(s): "FREET3" Anemia work up No results for input(s): "VITAMINB12", "FOLATE", "FERRITIN", "TIBC", "IRON", "RETICCTPCT" in the last 72 hours. Urinalysis    Component Value Date/Time   COLORURINE STRAW (A) 01/30/2022 1358   APPEARANCEUR CLEAR (A) 01/30/2022 1358   LABSPEC 1.006 01/30/2022 1358   PHURINE 7.0 01/30/2022 1358   GLUCOSEU NEGATIVE 01/30/2022 1358   HGBUR NEGATIVE 01/30/2022 [place] 01/30/2022 1358   BILIRUBINUR Negative 02/16/2021 1036   KETONESUR NEGATIVE 01/30/2022 1358   PROTEINUR NEGATIVE 01/30/2022 1358   UROBILINOGEN 0.2 02/16/2021 1036   NITRITE NEGATIVE 01/30/2022 1358   LEUKOCYTESUR NEGATIVE 01/30/2022 1358   Sepsis Labs Recent Labs  Lab 01/30/22 1129  WBC 4.4   Microbiology No results found for this or any previous visit (from the past 240 hour(s)). Imaging US Carotid Bilateral  Result Date: 01/31/2022 CLINICAL DATA:  89 year old female with history of stroke. EXAM: BILATERAL CAROTID DUPLEX ULTRASOUND TECHNIQUE: Pearline Cables scale imaging, color Doppler and duplex ultrasound were performed of bilateral carotid and vertebral arteries in the neck. COMPARISON:  None Available. FINDINGS: Criteria: Quantification of carotid stenosis is based on velocity parameters that correlate the residual internal carotid diameter with NASCET-based stenosis levels, using the diameter of the distal internal carotid lumen as the denominator for stenosis measurement. The following velocity measurements were obtained: RIGHT ICA: Peak systolic velocity 62 cm/sec, End diastolic velocity 18 cm/sec CCA: Peak systolic velocity 53 cm/sec SYSTOLIC ICA/CCA RATIO:  1.2 ECA: Peak systolic velocity 70 cm/sec LEFT ICA: Peak systolic velocity 82 cm/sec, End diastolic velocity 18 cm/sec CCA: 77 cm/sec SYSTOLIC ICA/CCA RATIO:  1.1 ECA: 77 cm/sec RIGHT CAROTID ARTERY: Mild  multifocal atherosclerotic plaque formation, most prominent at the carotid bulb. No significant tortuosity. Normal low resistance waveforms. RIGHT VERTEBRAL ARTERY:  Antegrade flow. LEFT CAROTID ARTERY: Mild multifocal atherosclerotic plaque formation. No significant tortuosity. Normal low resistance waveforms. LEFT VERTEBRAL ARTERY:  Antegrade flow. Upper extremity non-invasive blood pressures: Not obtained. IMPRESSION: 1. Right carotid artery system: Less than 50% stenosis secondary to mild multifocal atherosclerotic plaque formation. 2. Left carotid artery system: Less than 50% stenosis secondary to mild multifocal atherosclerotic plaque formation. 3.  Vertebral artery system: Patent with antegrade flow bilaterally. Ruthann Cancer, MD [facility] Electronically Signed   By: Ruthann Cancer M.D.   On: 01/31/2022 19:55  MR ANGIO HEAD WO CONTRAST  Result Date: 01/31/2022 CLINICAL DATA:  Right-sided facial droop. Right upper and lower extremity weakness. Left thalamic infarct. EXAM: MRA HEAD WITHOUT CONTRAST TECHNIQUE: Angiographic images of the Circle of Willis were acquired using MRA technique without intravenous contrast. COMPARISON:  MR head without and with contrast 01/31/2022. FINDINGS: Anterior circulation: Atherosclerotic calcifications are present within the cavernous internal carotid arteries bilaterally. Signal loss in the Periophthalmic and supraclinoid right ICA is concerning for high-grade stenosis. Diffuse irregularity is present in the same segments on the left without focal stenosis. Signal loss within the cavernous ICA proximal to the genu on both sides may be artifactual. The left A1 segment is dominant. The anterior communicating artery is patent. The anterior communicating artery is dilated up to 3 mm on the left. M1 segments are normal bilaterally. MCA bifurcations are normal. ACA and MCA branch vessels are within normal limits  bilaterally. Posterior circulation: Right PICA origin is visualized normal. The right vertebral artery is dominant. Fenestration is present at the vertebrobasilar junction. The basilar artery is normal. Both posterior cerebral arteries originate from basilar tip. Posterior communicating arteries contribute. Moderate stenosis is present in the right P2 segment. Distal PCA branch vessels are within normal limits bilaterally. Anatomic variants: None Other: None. IMPRESSION: 1. Signal loss in the Periophthalmic and supraclinoid right ICA is concerning for high-grade stenosis. 2. Diffuse atherosclerotic irregularity in the same segments on the left without focal stenosis. 3. Signal loss within the cavernous ICA proximal to the genu may be artifactual. 4. Moderate stenosis of the right P2 segment. 5. Dilated anterior communicating artery up to 3 mm on the left. This likely represents an infundibulum. Electronically Signed   By: San Morelle M.D.   On: 01/31/2022 12:59   MR BRAIN W WO CONTRAST  Result Date: 01/31/2022 CLINICAL DATA:  Neuro deficit, acute, stroke suspected. Acute onset of right-sided facial droop with right upper and lower extremity weakness. EXAM: MRI HEAD WITHOUT AND WITH CONTRAST TECHNIQUE: Multiplanar, multiecho pulse sequences of the brain and surrounding structures were obtained without and with intravenous contrast. CONTRAST:  6.58m GADAVIST GADOBUTROL 1 MMOL/ML IV SOLN COMPARISON:  CT head without contrast 01/30/2022. MR head without and with contrast 12/18/2019 FINDINGS: Brain: The diffusion-weighted images confirm an acute nonhemorrhagic 6 mm infarct of the lateral left thalamus no other acute infarcts are present. T2 and FLAIR hyperintensity are associated with the acute infarct. Periventricular and T2 hyperintensities are otherwise stable. Subcortical scratched at asymmetric subcortical T2 hyperintensity in the left subinsular white matter is stable. Remote lacunar infarcts of the  left cerebellum are stable. Remote right occipital infarct is stable. Dilated perivascular spaces are present within the basal ganglia. The ventricles are proportionate to the degree of atrophy. No significant extraaxial fluid collection is present. Vascular: Flow is present in the major intracranial arteries. Skull and upper cervical spine: Degenerative changes are present the upper cervical spine, similar the prior exam. Craniocervical junction is within normal limits. Marrow signal is normal. Midline structures are unremarkable. Sinuses/Orbits: The paranasal sinuses and mastoid air cells are clear. The globes and orbits are within normal limits. IMPRESSION: 1. Acute nonhemorrhagic 6 mm infarct of the lateral left thalamus. 2. Stable atrophy and white matter disease. This likely reflects the sequela of chronic microvascular ischemia. 3. Remote infarcts of the left cerebellum and right occipital lobe are stable. Electronically Signed   By: CSan MorelleM.D.   On: 01/31/2022 12:52   ECHOCARDIOGRAM COMPLETE  Result Date: 01/31/2022  ECHOCARDIOGRAM REPORT   Patient Name:   ANDRELL TALLMAN Date of Exam: 01/31/2022 Medical Rec #:  [id number]      Height:       60.0 in Accession #:    [id number]     Weight:       132.0 lb Date of Birth:  [birth year]     BSA:          1.564 m Patient Age:    29 years       BP:           136/53 mmHg Patient Gender: F              HR:           70 bpm. Exam Location:  [facility] Procedure: 2D Echo, Color Doppler and Cardiac Doppler Indications:     I63.9 Stroke  History:         Patient has no prior history of Echocardiogram examinations.                  CKD; Risk Factors:Hypertension, Diabetes and Dyslipidemia.  Sonographer:     Charmayne Sheer Referring Phys:  [id number] AMY N COX Diagnosing Phys: Isaias Cowman MD IMPRESSIONS  1. Left ventricular ejection fraction, by estimation, is 60 to 65%. The left ventricle has normal function. The left ventricle has no regional wall motion  abnormalities. There is moderate left ventricular hypertrophy. Left ventricular diastolic parameters are consistent with Grade I diastolic dysfunction (impaired relaxation).  2. Right ventricular systolic function is normal. The right ventricular size is normal.  3. The mitral valve is normal in structure. No evidence of mitral valve regurgitation. No evidence of mitral stenosis.  4. The aortic valve is calcified. Aortic valve regurgitation is mild to moderate. Mild to moderate aortic valve stenosis.  5. The inferior vena cava is normal in size with greater than 50% respiratory variability, suggesting right atrial pressure of 3 mmHg. FINDINGS  Left Ventricle: Left ventricular ejection fraction, by estimation, is 60 to 65%. The left ventricle has normal function. The left ventricle has no regional wall motion abnormalities. The left ventricular internal cavity size was normal in size. There is  moderate left ventricular hypertrophy. Left ventricular diastolic parameters are consistent with Grade I diastolic dysfunction (impaired relaxation). Right Ventricle: The right ventricular size is normal. No increase in right ventricular wall thickness. Right ventricular systolic function is normal. Left Atrium: Left atrial size was normal in size. Right Atrium: Right atrial size was normal in size. Pericardium: There is no evidence of pericardial effusion. Mitral Valve: The mitral valve is normal in structure. No evidence of mitral valve regurgitation. No evidence of mitral valve stenosis. Tricuspid Valve: The tricuspid valve is normal in structure. Tricuspid valve regurgitation is not demonstrated. No evidence of tricuspid stenosis. Aortic Valve: The aortic valve is calcified. Aortic valve regurgitation is mild to moderate. Aortic regurgitation PHT measures 563 msec. Mild to moderate aortic stenosis is present. Aortic valve mean gradient measures 12.0 mmHg. Aortic valve peak gradient measures 20.6 mmHg. Aortic valve area, by  VTI measures 1.47 cm. Pulmonic Valve: The pulmonic valve was normal in structure. Pulmonic valve regurgitation is not visualized. No evidence of pulmonic stenosis. Aorta: The aortic root is normal in size and structure. Venous: The inferior vena cava is normal in size with greater than 50% respiratory variability, suggesting right atrial pressure of 3 mmHg. IAS/Shunts: No atrial level shunt detected by color flow Doppler.  LEFT VENTRICLE PLAX 2D LVIDd:  2.60 cm   Diastology LVIDs:         1.80 cm   LV e' medial:    4.03 cm/s LV PW:         1.40 cm   LV E/e' medial:  16.4 LV IVS:        1.10 cm   LV e' lateral:   8.59 cm/s LVOT diam:     1.90 cm   LV E/e' lateral: 7.7 LV SV:         68 LV SV Index:   44 LVOT Area:     2.84 cm  RIGHT VENTRICLE RV Basal diam:  2.50 cm RV S prime:     21.40 cm/s LEFT ATRIUM           Index        RIGHT ATRIUM           Index LA diam:      3.40 cm 2.17 cm/m   RA Area:     10.20 cm LA Vol (A2C): 44.7 ml 28.57 ml/m  RA Volume:   16.60 ml  10.61 ml/m LA Vol (A4C): 31.1 ml 19.88 ml/m  AORTIC VALVE                     PULMONIC VALVE AV Area (Vmax):    1.41 cm      PV Vmax:          1.09 m/s AV Area (Vmean):   1.28 cm      PV Vmean:         72.900 cm/s AV Area (VTI):     1.47 cm      PV VTI:           0.210 m AV Vmax:           227.00 cm/s   PV Peak grad:     4.8 mmHg AV Vmean:          168.000 cm/s  PV Mean grad:     2.0 mmHg AV VTI:            0.465 m       PR End Diast Vel: 2.32 msec AV Peak Grad:      20.6 mmHg AV Mean Grad:      12.0 mmHg LVOT Vmax:         113.00 cm/s LVOT Vmean:        76.100 cm/s LVOT VTI:          0.241 m LVOT/AV VTI ratio: 0.52 AI PHT:            563 msec  AORTA Ao Root diam: 2.80 cm MITRAL VALVE               TRICUSPID VALVE MV Area (PHT): 3.40 cm    TR Peak grad:   28.7 mmHg MV Decel Time: 223 msec    TR Vmax:        268.00 cm/s MV E velocity: 66.00 cm/s MV A velocity: 94.30 cm/s  SHUNTS MV E/A ratio:  0.70        Systemic VTI:  0.24 m                             Systemic Diam: 1.90 cm Isaias Cowman MD Electronically signed by Isaias Cowman MD Signature Date/Time: 01/31/2022/12:27:27 PM    Final       Time coordinating discharge: over  30 minutes  SIGNED:  Emeterio Reeve DO [facility]

## 2022-02-01 NOTE — TOC Transition Note (Signed)
Transition of Care [facility]) - CM/SW Discharge Note   Patient Details  Name: Ellen [name] MRN: [id number] Date of Birth: 03-Apr-1929  Transition of Care [facility]) CM/SW Contact:  Gerilyn Pilgrim, LCSW Phone Number: 02/01/2022, 9:25 AM   Clinical Narrative:   Per MD, pt does not wish to proceed with AIR and would like to discharge home with home health PT/OT. Orders are in, [place] [facility] has agreed to accept referral.     Final next level of care: [place] Barriers to Discharge: Barriers Resolved   Patient Goals and CMS Choice   Choice offered to / list presented to : Patient  Discharge Placement                         Discharge Plan and Services Additional resources added to the After Visit Summary for                            [facility] Arranged: PT, OT [facility] Agency: [place] ([place]) Date HH Agency Contacted: 02/01/22 Time HH Agency Contacted: 0900 Representative spoke with at [place]: [place] ([place]) Interventions [place]: No Food Insecurity (01/30/2022)  Housing: Low Risk  (01/30/2022)  Transportation Needs: No Transportation Needs (01/30/2022)  Utilities: Not At Risk (01/30/2022)  Alcohol Screen: Low Risk  (07/23/2021)  Depression (PHQ2-9): Low Risk  (07/23/2021)  Financial Resource Strain: Low Risk  (07/23/2021)  Physical Activity: Sufficiently Active (03/05/2021)  Social Connections: Moderately Integrated (07/23/2021)  Stress: No Stress Concern Present (07/23/2021)  Tobacco Use: Low Risk  (01/30/2022)     Readmission Risk Interventions     No data to display

## 2022-02-02 LAB — GLUCOSE, CAPILLARY: Glucose-Capillary: 66 mg/dL — ABNORMAL LOW (ref 70–99)

## 2022-02-02 LAB — HEMOGLOBIN A1C
Hgb A1c MFr Bld: 6 % — ABNORMAL HIGH (ref 4.8–5.6)
Mean Plasma Glucose: 126 mg/dL

## 2022-02-03 ENCOUNTER — Telehealth: Payer: Self-pay | Admitting: *Deleted

## 2022-02-03 ENCOUNTER — Encounter: Payer: Self-pay | Admitting: *Deleted

## 2022-02-03 NOTE — Patient Outreach (Signed)
  Care Coordination [facility] Note Transition Care Management Unsuccessful Follow-up Telephone Call  Date of discharge and from where:  Monday 02/01/22; [place] [facility]  Attempts:  1st Attempt  Reason for unsuccessful TCM follow-up call:  Unable to leave message- attempted calls to both numbers on file for patient; unable to leave voice messages on both phone numbers  Oneta Rack, RN, BSN, CCRN Alumnus RN CM Care Coordination/ Transition of [place] Management [phone]: direct office

## 2022-02-04 ENCOUNTER — Encounter: Payer: Self-pay | Admitting: *Deleted

## 2022-02-04 ENCOUNTER — Telehealth: Payer: Self-pay | Admitting: *Deleted

## 2022-02-04 NOTE — Patient Outreach (Signed)
  Care Coordination [facility] Note Transition Care Management Unsuccessful Follow-up Telephone Call  Date of discharge and from where:  Monday, 02/01/22, [place]; [facility]  Attempts:  2nd Attempt  Reason for unsuccessful TCM follow-up call:  Unable to leave message attempted calls to both numbers listed on file for patient and for daughter [name] on [facility] DPR- unable to leave voice message requesting call back on either number  Oneta Rack, RN, BSN, CCRN Alumnus RN CM Care Coordination/ Transition of [place] Management [phone]: direct office

## 2022-02-05 ENCOUNTER — Telehealth: Payer: Self-pay | Admitting: *Deleted

## 2022-02-05 ENCOUNTER — Encounter: Payer: Self-pay | Admitting: *Deleted

## 2022-02-05 ENCOUNTER — Other Ambulatory Visit: Payer: Self-pay | Admitting: Family Medicine

## 2022-02-05 DIAGNOSIS — E1121 Type 2 diabetes mellitus with diabetic nephropathy: Secondary | ICD-10-CM

## 2022-02-05 DIAGNOSIS — I6930 Unspecified sequelae of cerebral infarction: Secondary | ICD-10-CM

## 2022-02-05 DIAGNOSIS — E1169 Type 2 diabetes mellitus with other specified complication: Secondary | ICD-10-CM

## 2022-02-05 DIAGNOSIS — I10 Essential (primary) hypertension: Secondary | ICD-10-CM

## 2022-02-05 NOTE — Patient Outreach (Signed)
Care Coordination [facility] Note Transition Care Management Follow-up Telephone Call Date of discharge and from where: Monday 02/01/22; [facility]; CVA/ TIA How have you been since you were released from the hospital? Per daughter/ caregiver Ellen [name], on [facility] DPR: "I am glad you called me, because she (patient) has not been taking any phone calls and her voice mail is full.  I am currently in [place] and will be flying back home to [facility] on Monday 02/08/22.  She lives by herself and is very independent; I live in [place], [place], and check in on her a couple of times each week.  She has been letting the home health PT come in since she came home from the hospital, but since she came home she has been falling and driving herself all over the place, even though she tells me she is weak and her legs don't seem to to be working like they should.  She does not use a cane or a walker, and so far has been able to get up by herself when she falls, and tells me she is not injured with her falling episodes.  She also tells me that she does not want to see the female doctor at [facility] anymore, she prefers to see the NP.  She used to have medicaid, and I wonder if she might re-qualify now and be able to get personal care services.  I really appreciate your call today to help me understand what I need to do to get things straightened out with her care.  When you talk to the schedulers at [facility], please tell them to call me, [phone]- not her, because she is not taking any calls and she won't clear out her voice mail box.  She has given permission for everyone to talk to me, please have them call me as soon as they are able." Any questions or concerns? Yes 1) multiple falls without injury after recent hospitalization in setting of recent TIA/ CVA in patient who lives alone and does not use assistive devices: fall prevention education provided/ reinforced; daughter reports patient refuses to use assistive devices and currently has  been able to get herself up after fall episodes without assistance and has not had any serious injury 2) no scheduled hospital follow up PCP appointment: daughter reports patient does not feel a good rapport with current female PCP but likes the NP at her PCP practice: she requests that PCP be changed to NP at practice; sent request to facilitate scheduling of hospital follow up appointment with NP Webb Silversmith to scheduling care guide team 3) Noted patient was previously active with CCM team at PCP practice: daughter Ellen [name] was unaware of this, she requests that CCM team re-engage and call her (daughter Ellen [name]) to discuss ongoing care management needs-- noted last CCM order placed 02/16/21- will schedule with RN CM Care Coordinator as a bridge and send request to PCP to consider re-referral of CCM services; will copy previously involved CCM RN CM as FYI 4) questions around [facility] eligibility/ caregiver reports patient used to have medicaid but this was dropped when she moved from [facility] to [place]- encouraged caregiver to discuss with RN [place] Coordinator when scheduled call takes place on 02/09/22  Items Reviewed: Did the pt receive and understand the discharge instructions provided?  Caregiver unsure, but reports patient told her that she picked up and is taking newly prescribed medications post-hospital discharge  Medications obtained and verified?  Unable to confirm: caregiver reports patient self-manages  medications and reported that she had obtained newly prescribed medications post-hospital discharge and is taking as instructed Other? No  Any new allergies since your discharge? No  Dietary orders reviewed? No Do you have support at home? Yes  family and friends check in periodically; caregiver/ daughter reports patient is essentially independent in all aspects of self-care  Home Care and Equipment/Supplies: Were home health services ordered? yes If so, what is the name of the agency? [facility]   Has the agency set up a time to come to the patient's home? Yes- daughter confirms home health services are currently active per patient's report to her Were any new equipment or medical supplies ordered?  No What is the name of the medical supply agency? N/A Were you able to get the supplies/equipment? not applicable Do you have any questions related to the use of the equipment or supplies? No N/A  Functional Questionnaire: (I = Independent and D = Dependent) ADLs: I  Bathing/Dressing- I  Meal Prep- I  Eating- I  Maintaining continence- I  Transferring/Ambulation- I  Managing Meds- I  Follow up appointments reviewed:  PCP Hospital f/u appt confirmed? No  Scheduled to see - on - @ [facility] f/u appt confirmed? No  Scheduled to see - on - @ - Are transportation arrangements needed? No  If their condition worsens, is the pt aware to call PCP or go to the Emergency Dept.? Yes Was the patient provided with contact information for the PCP's office or ED? No- daughter reports she already has contact information for all care providers Was to pt encouraged to call back with questions or concerns? Yes  SDOH assessments and interventions completed:   Yes SDOH Interventions Today    Flowsheet Row Most Recent Value  SDOH Interventions   Food Insecurity Interventions Intervention Not Indicated  Transportation Interventions Intervention Not Indicated  [daughter reports patient still drives self]      Care Coordination Interventions:  PCP follow up appointment requested Referred for Care Coordination Services:  RN Care Coordinator Fall assessment/ education provided; care coordination outreach to PCP and RN CM Care Coordinator/ CCM RN CM previously involved in patient's care completed; provided general education around process to determine medicaid eligibility and nature of personal care services     Encounter Outcome:  Pt. Visit Completed    Oneta Rack, RN,  BSN, CCRN Alumnus RN CM Care Coordination/ Transition of [place] Management [phone]: direct office

## 2022-02-05 NOTE — Patient Outreach (Signed)
  Care Coordination TOC Note Transition Care Management Unsuccessful Follow-up Telephone Call  Date of discharge and from where:  Monday 02/01/22; [place]; CVA  Attempts:  3rd Attempt  Reason for unsuccessful TCM follow-up call:  Left voice message Attempted calls to both numbers on file for patient- unable to leave voice message on preferred number- voice mailbox is full; left voice message on alternate number, requesting call back  Oneta Rack, RN, BSN, CCRN Alumnus RN CM Care Coordination/ Transition of [place] Management [phone]: direct office

## 2022-02-05 NOTE — Progress Notes (Signed)
  Care Coordination  Note  02/05/2022 Name: Kalle Bernath MRN: [id number] DOB: Jun 08, 1929  Ayana Imhof is a 89 y.o. year old primary care patient of [name], DO. I reached out to [employer] by phone today to assist with scheduling a follow up appointment. Marrian Fadden verbally consented to my assistance.       Follow up plan: Hospital Follow Up appointment scheduled with Webb Silversmith, NP) on (02/11/2022) at (2pm).  Julian Hy, [place] Direct Dial: [phone]

## 2022-02-08 ENCOUNTER — Ambulatory Visit: Payer: Self-pay

## 2022-02-08 ENCOUNTER — Telehealth: Payer: Medicare Other

## 2022-02-08 DIAGNOSIS — I10 Essential (primary) hypertension: Secondary | ICD-10-CM

## 2022-02-08 DIAGNOSIS — I6930 Unspecified sequelae of cerebral infarction: Secondary | ICD-10-CM

## 2022-02-08 NOTE — Plan of Care (Signed)
Chronic Care Management Provider Comprehensive Care Plan    02/08/2022 Name: Ellen [name] MRN: [id number] DOB: 1929/08/14  Referral to Chronic Care Management (CCM) services was placed by Provider:  Dr. Parks Ranger on Date: 02-05-2022.  Chronic Condition 1: HTN Provider Assessment and Plan  Relevant Medications     amLODipine (NORVASC) 10 MG tablet    lisinopril-hydrochlorothiazide (ZESTORETIC) 20-12.5 MG tablet    pravastatin (PRAVACHOL) 20 MG tablet    Other Relevant Orders    AMB Referral to [facility]     Expected Outcome/Goals Addressed This Visit (Provider CCM goals/Provider Assessment and plan   CCM (HYPERTENSION)  EXPECTED OUTCOME:  MONITOR,SELF- MANAGE AND REDUCE SYMPTOMS OF HYPERTENSION   Symptom Management Condition 1: Take all medications as prescribed Attend all scheduled provider appointments Call provider office for new concerns or questions  call the Suicide and Crisis Lifeline: 988 call the [place] National Suicide Prevention Lifeline: 916-108-4498 or TTY: (220)849-6748 [place] (437)675-2831) to talk to a trained counselor call 1-800-273-TALK (toll free, 24 hour hotline) if experiencing a Mental Health or [place]  check blood pressure 3 times per week write blood pressure results in a log or diary learn about high blood pressure keep a blood pressure log take blood pressure log to all doctor appointments call doctor for signs and symptoms of high blood pressure keep all doctor appointments take medications for blood pressure exactly as prescribed report new symptoms to your doctor  Chronic Condition 2: Stroke/TIA Provider Assessment and Plan  Right thalamic infarction [facility]) - Neurology has been consulted and we appreciate further recommendations - Patient is outside window for tPA/TNK - Complete echo ordered - Fasting lipid and A1c ordered in the AM - Permissive hypertension per neurology recommendations - Aspirin 81 mg daily  resumed for 01/31/2022 - Frequent neuro vascular checks - PT, OT - Fall precaution   Expected Outcome/Goals Addressed This Visit (Provider CCM goals/Provider Assessment and plan   CCM (Stroke/TIA)  EXPECTED OUTCOME:  MONITOR,SELF- MANAGE AND REDUCE SYMPTOMS OF Stroke/TIA   Symptom Management Condition 2: Take all medications as prescribed Attend all scheduled provider appointments Call provider office for new concerns or questions  call the Suicide and Crisis Lifeline: 988 call the [place] National Suicide Prevention Lifeline: (315)484-7870 or TTY: 669-735-2154 TTY 579 561 0606) to talk to a trained counselor call 1-800-273-TALK (toll free, 24 hour hotline) if experiencing a Mental Health or [place] Crisis   Problem List Patient Active Problem List   Diagnosis Date Noted   Right thalamic infarction ([place]) 01/30/2022   Multinodular goiter 12/25/2019   Aortic atherosclerosis ([place]) 12/18/2019   Genetic testing 10/06/2018   Family history of ovarian cancer    Dysuria 09/24/2018   Rotator cuff arthropathy of right shoulder 06/16/2018   Rotator cuff arthropathy of left shoulder 03/13/2018   Chronic pain of both shoulders 03/13/2018   Primary cancer of upper outer quadrant of left female breast ([place]) 01/08/2018   Type 2 diabetes mellitus with diabetic nephropathy, without long-term current use of insulin ([place]) 11/10/2017   Chronic headaches 11/10/2017   Hyperlipidemia associated with type 2 diabetes mellitus ([place]) 11/10/2017   Essential hypertension 11/10/2017   Enteritis 09/18/2017    Medication Management  Current Outpatient Medications:    Accu-Chek Softclix Lancets lancets, Use to check blood sugar up to twice daily, Disp: 200 each, Rfl: 3   acetaminophen (TYLENOL) 500 MG tablet, Take 1,000 mg by mouth 2 (two) times daily as needed for mild pain or moderate pain., Disp: ,  Rfl:    aspirin 81 MG chewable tablet, Chew 81 mg by mouth daily. , Disp: , Rfl:    atorvastatin  (LIPITOR) 40 MG tablet, Take 1 tablet (40 mg total) by mouth daily., Disp: 30 tablet, Rfl: 0   Blood Glucose Monitoring Suppl (ACCU-CHEK GUIDE) w/Device KIT, Use as directed to monitor blood sugar, Disp: 1 kit, Rfl: 0   cloNIDine (CATAPRES) 0.1 MG tablet, TAKE 1 TABLET BY MOUTH DAILY AS NEEDED FOR ELEVATED BLOOD PRESSURE GREATER THAN 180/100/HEADACHE, Disp: 30 tablet, Rfl: 2   clopidogrel (PLAVIX) 75 MG tablet, Take 1 tablet (75 mg total) by mouth daily., Disp: 30 tablet, Rfl: 0   glucose blood (ACCU-CHEK GUIDE) test strip, Use to check blood sugar up to twice daily, Disp: 200 each, Rfl: 3   Lancets (ONETOUCH ULTRASOFT) lancets, Use to check blood sugar daily, up to 2 times if need, Disp: 100 each, Rfl: 3   lisinopril-hydrochlorothiazide (ZESTORETIC) 20-12.5 MG tablet, Take 1 tablet by mouth daily., Disp: 90 tablet, Rfl: 3   Multiple Vitamins-Minerals (CERTAVITE SENIOR/ANTIOXIDANT) TABS, Take 1 tablet by mouth daily., Disp: , Rfl:   Cognitive Assessment Identity Confirmed: : Name; DOB Cognitive Status: Normal Other:  : HIPPA verified on behalf of her daughter [name]   Functional Assessment Hearing Difficulty or Deaf: no Wear Glasses or Blind: no Concentrating, Remembering or Making Decisions Difficulty (CP): no Difficulty Communicating: no Difficulty Eating/Swallowing: no Walking or Climbing Stairs Difficulty: no Dressing/Bathing Difficulty: no Doing Errands Independently Difficulty (such as shopping) (CP): no Change in Functional Status Since Onset of Current Illness/Injury: no   Caregiver Assessment  Primary Source of Support/Comfort: child(ren) Name of Support/Comfort Primary Source: Chief Operating Officer and sister and brother People in Home: alone Name(s) of People in Home: patient lives alone Family Caregiver if Needed: child(ren), adult Family Caregiver Names: [name], sister, and brother Primary Roles/Responsibilities: retired Expected Impact of Illness/Hospitalization:  patient recently in the hospital for a TIA- the patient released on 02-01-2022 Concerns About Impact on Relationships: the patients daughter [name] is concerned about her mother but the patient is very independent   Planned Interventions  Evaluation of current treatment plan related to CVA/TIA and post discharge needs related to CVA/TIA and patient's adherence to plan as established by provider Advised patient to keep appointments with pcp, write down questions and concerns to ask the pcp, and review of available resources to the patient. Spoke to the patients daughter, [name] who is also DRP. Provided education to patient re: the goals of the CCM team, available disciplines and resources, and help with needed support for the patient in the home setting post hospitalization. The patient is very independent. The patient has family members that have been checking on her daily. She continues to live alone. Her daughter [name] is coming in today from [place] and will be staying with the patient for several days and going with the patient to her pcp appointment. Education and support provided.  Reviewed medications with patient and discussed compliance. Post discharge medications have been picked up and the patient is taking as directed.  Provided patient with CVA educational materials related to warning sx and sx of new onset of CVA Reviewed scheduled/upcoming provider appointments including 02-11-2022 at 2 pm with pcp Discussed plans with patient for ongoing care management follow up and provided patient with direct contact information for care management team Advised patient to discuss questions, concerns, needs of the patient, and ongoing support for effective management of chronic conditions with provider Screening for signs and  symptoms of depression related to chronic disease state  Assessed social determinant of health barriers The patients daughter has arranged physical therapy to come into the home to  work with the patient on Wednesdays and Fridays. The patients daughter is also going to see if she can get the patient to come to stay with her for a few weeks.  Review of safety in the home. The patient has a life alert system in place. The patient did call her daughter when she felt numbness in her hand, arm, and leg. Will continue to monitor for changes, needs, or concerns. The patients daughter has the [facility] number to call for assistance or new needs. Evaluation of current treatment plan related to hypertension self management and patient's adherence to plan as established by provider. The patient post hospital stay for TIA. Has gone back on her blood pressure medication. Will see the pcp on 02-11-2022. Patients daughter is traveling to the patients home later today and will be staying with her. She hopes to take the patient to stay with her for a couple weeks or more. She states the patient likely will want to stay in her home due to her being so independent. RNCM gave support and education to the patients daughter. Encouraged her to write down questions to ask the pcp at upcoming visit.;   Provided education to patient re: stroke prevention, s/s of heart attack and stroke. Review and education, patient recent hospital stay for TIA 01-30-2022 to 02-01-2022; Reviewed prescribed diet heart healthy/ADA diet. Education and support given Reviewed medications with patient and discussed importance of compliance. Endorses compliance with medications. The patient after discharge went on 02-02-2022 and picked up her medications from [facility];  Discussed plans with patient for ongoing care management follow up and provided patient with direct contact information for care management team; Advised patient, providing education and rationale, to monitor blood pressure daily and record, calling PCP for findings outside established parameters;  Reviewed scheduled/upcoming provider appointments including: 02-11-2022 at 2 pm.  The patient has in the past cancelled several appointments with the pcp per the daughter. The daughter is coming in tonight to the patients home and will go with her to her upcoming appointment on 02-11-2022 Advised patient to discuss changes in blood pressures or heart health with provider; Provided education on prescribed diet heart healthy/ADA diet ;  Discussed complications of poorly controlled blood pressure such as heart disease, stroke, circulatory complications, vision complications, kidney impairment, sexual dysfunction;  Screening for signs and symptoms of depression related to chronic disease state;  Assessed social determinant of health barriers;    Interaction and coordination with outside resources, practitioners, and providers See CCM Referral  Care Plan: Available in MyChart

## 2022-02-08 NOTE — Chronic Care Management (AMB) (Signed)
Chronic Care Management   CCM RN Visit Note  02/08/2022 Name: Ellen [name] MRN: [id number] DOB: 05/23/1929  Subjective: Ellen [name] is a 89 y.o. year old female who is a primary care patient of [name], DO. The patient was referred to the Chronic Care Management team for assistance with care management needs subsequent to provider initiation of CCM services and plan of care.    Today's Visit:   Spoke to the patients daughter, Ellen [name] who is also DRP  for initial visit.     SDOH Interventions Today    Flowsheet Row Most Recent Value  SDOH Interventions   Food Insecurity Interventions Intervention Not Indicated  Housing Interventions Intervention Not Indicated  Transportation Interventions Intervention Not Indicated  Utilities Interventions Intervention Not Indicated  Alcohol Usage Interventions Intervention Not Indicated (Score <7)  Financial Strain Interventions Intervention Not Indicated  Stress Interventions Intervention Not Indicated  Social Connections Interventions Intervention Not Indicated         Goals Addressed             This Visit's Progress    CCM Expected Outcome:  Monitor, Self-Manage and Reduce Symptoms of: History of Stroke       Current Barriers:  Knowledge Deficits related to the importance of seeking early help for sx and sx of heart attack and stroke. The patient was experiencing numbness in her hands and legs. She went to the hospital by EMS and was admitted from 01-30-2022 to 02-01-2022 for R Thalamic infarct Care Coordination needs related to resources in the home for home health in a patient with post CVA/TIA and post discharge from the hospital on 02-01-2022 Chronic Disease Management support and education needs related to effective management of post CVA/TIA and other chronic conditions  Non-adherence to scheduled provider appointments  Planned Interventions: Evaluation of current treatment plan related to CVA/TIA and post  discharge needs related to CVA/TIA and patient's adherence to plan as established by provider Advised patient to keep appointments with pcp, write down questions and concerns to ask the pcp, and review of available resources to the patient. Spoke to the patients daughter, Ellen [name] who is also DRP. Provided education to patient re: the goals of the CCM team, available disciplines and resources, and help with needed support for the patient in the home setting post hospitalization. The patient is very independent. The patient has family members that have been checking on her daily. She continues to live alone. Her daughter Ellen [name] is coming in today from [place] and will be staying with the patient for several days and going with the patient to her pcp appointment. Education and support provided.  Reviewed medications with patient and discussed compliance. Post discharge medications have been picked up and the patient is taking as directed.  Provided patient with CVA educational materials related to warning sx and sx of new onset of CVA Reviewed scheduled/upcoming provider appointments including 02-11-2022 at 2 pm with pcp Discussed plans with patient for ongoing care management follow up and provided patient with direct contact information for care management team Advised patient to discuss questions, concerns, needs of the patient, and ongoing support for effective management of chronic conditions with provider Screening for signs and symptoms of depression related to chronic disease state  Assessed social determinant of health barriers The patients daughter has arranged physical therapy to come into the home to work with the patient on Wednesdays and Fridays. The patients daughter is also going to see if she can get the  patient to come to stay with her for a few weeks.  Review of safety in the home. The patient has a life alert system in place. The patient did call her daughter when she felt numbness in her  hand, arm, and leg. Will continue to monitor for changes, needs, or concerns. The patients daughter has the [facility] number to call for assistance or new needs.   Symptom Management: Take medications as prescribed   Attend all scheduled provider appointments Call provider office for new concerns or questions  call the Suicide and Crisis Lifeline: 988 call the [place] National Suicide Prevention Lifeline: 269-678-3888 or TTY: 929 652 7075 TTY 830 589 2549) to talk to a trained counselor call 1-800-273-TALK (toll free, 24 hour hotline) if experiencing a Mental Health or [place]   Follow Up Plan: Telephone follow up appointment with care management team member scheduled for: 03-01-2022 at 28 am       CCM Expected Outcome:  Monitor, Self-Manage, and Reduce Symptoms of Hypertension       Current Barriers:  Knowledge Deficits related to to the benefits of checking blood pressures and monitoring for changes in HTN and heart health  Care Coordination needs related to support and education post hospitalization  in a patient with HTN and other chronic conditions Chronic Disease Management support and education needs related to effective management of HTN BP Readings from Last 3 Encounters:  02/01/22 (!) 159/61  07/23/21 132/68  07/23/21 132/68     Planned Interventions: Evaluation of current treatment plan related to hypertension self management and patient's adherence to plan as established by provider. The patient post hospital stay for TIA. Has gone back on her blood pressure medication. Will see the pcp on 02-11-2022. Patients daughter is traveling to the patients home later today and will be staying with her. She hopes to take the patient to stay with her for a couple weeks or more. She states the patient likely will want to stay in her home due to her being so independent. RNCM gave support and education to the patients daughter. Encouraged her to write down questions to ask the pcp  at upcoming visit.;   Provided education to patient re: stroke prevention, s/s of heart attack and stroke. Review and education, patient recent hospital stay for TIA 01-30-2022 to 02-01-2022; Reviewed prescribed diet heart healthy/ADA diet. Education and support given Reviewed medications with patient and discussed importance of compliance. Endorses compliance with medications. The patient after discharge went on 02-02-2022 and picked up her medications from [facility];  Discussed plans with patient for ongoing care management follow up and provided patient with direct contact information for care management team; Advised patient, providing education and rationale, to monitor blood pressure daily and record, calling PCP for findings outside established parameters;  Reviewed scheduled/upcoming provider appointments including: 02-11-2022 at 2 pm. The patient has in the past cancelled several appointments with the pcp per the daughter. The daughter is coming in tonight to the patients home and will go with her to her upcoming appointment on 02-11-2022 Advised patient to discuss changes in blood pressures or heart health with provider; Provided education on prescribed diet heart healthy/ADA diet ;  Discussed complications of poorly controlled blood pressure such as heart disease, stroke, circulatory complications, vision complications, kidney impairment, sexual dysfunction;  Screening for signs and symptoms of depression related to chronic disease state;  Assessed social determinant of health barriers;   Symptom Management: Take medications as prescribed   Attend all scheduled provider appointments Call provider  office for new concerns or questions  call the Suicide and Crisis Lifeline: 988 call the [place] National Suicide Prevention Lifeline: 306-149-6610 or TTY: 5742410556 TTY 929-499-6479) to talk to a trained counselor call 1-800-273-TALK (toll free, 24 hour hotline) if experiencing a Mental Health or  [place]  check blood pressure 3 times per week write blood pressure results in a log or diary learn about high blood pressure keep a blood pressure log take blood pressure log to all doctor appointments call doctor for signs and symptoms of high blood pressure develop an action plan for high blood pressure keep all doctor appointments take medications for blood pressure exactly as prescribed report new symptoms to your doctor  Follow Up Plan: Telephone follow up appointment with care management team member scheduled for: 03-01-2022 at 1030 am          Plan:Telephone follow up appointment with care management team member scheduled for:  03-01-2022 at 47 am  Noreene Larsson RN, MSN, CCM RN Care Manager  Chronic Care Management Direct Number: [phone]

## 2022-02-08 NOTE — Patient Instructions (Addendum)
Please call the care guide team at [phone] if you need to cancel or reschedule your appointment.   If you are experiencing a Mental Health or [place] or need someone to talk to, please call the Suicide and Crisis Lifeline: 988 call the [place] National Suicide Prevention Lifeline: 925 116 2905 or TTY: 5166526485 TTY 631-615-1191) to talk to a trained counselor call 1-800-273-TALK (toll free, 24 hour hotline)   Following is a copy of the CCM Program Consent:  CCM service includes personalized support from designated clinical staff supervised by the physician, including individualized plan of care and coordination with other care providers 24/7 contact phone numbers for assistance for urgent and routine care needs. Service will only be billed when office clinical staff spend 20 minutes or more in a month to coordinate care. Only one practitioner may furnish and bill the service in a calendar month. The patient may stop CCM services at amy time (effective at the end of the month) by phone call to the office staff. The patient will be responsible for cost sharing (co-pay) or up to 20% of the service fee (after annual deductible is met)  Following is a copy of your full provider care plan:   Goals Addressed             This Visit's Progress    CCM Expected Outcome:  Monitor, Self-Manage and Reduce Symptoms of: History of Stroke       Current Barriers:  Knowledge Deficits related to the importance of seeking early help for sx and sx of heart attack and stroke. The patient was experiencing numbness in her hands and legs. She went to the hospital by EMS and was admitted from 01-30-2022 to 02-01-2022 for R Thalamic infarct Care Coordination needs related to resources in the home for home health in a patient with post CVA/TIA and post discharge from the hospital on 02-01-2022 Chronic Disease Management support and education needs related to effective management of post CVA/TIA and  other chronic conditions  Non-adherence to scheduled provider appointments  Planned Interventions: Evaluation of current treatment plan related to CVA/TIA and post discharge needs related to CVA/TIA and patient's adherence to plan as established by provider Advised patient to keep appointments with pcp, write down questions and concerns to ask the pcp, and review of available resources to the patient. Spoke to the patients daughter, [name] who is also DRP. Provided education to patient re: the goals of the CCM team, available disciplines and resources, and help with needed support for the patient in the home setting post hospitalization. The patient is very independent. The patient has family members that have been checking on her daily. She continues to live alone. Her daughter [name] is coming in today from [place] and will be staying with the patient for several days and going with the patient to her pcp appointment. Education and support provided.  Reviewed medications with patient and discussed compliance. Post discharge medications have been picked up and the patient is taking as directed.  Provided patient with CVA educational materials related to warning sx and sx of new onset of CVA Reviewed scheduled/upcoming provider appointments including 02-11-2022 at 2 pm with pcp Discussed plans with patient for ongoing care management follow up and provided patient with direct contact information for care management team Advised patient to discuss questions, concerns, needs of the patient, and ongoing support for effective management of chronic conditions with provider Screening for signs and symptoms of depression related to chronic disease state  Assessed  social determinant of health barriers The patients daughter has arranged physical therapy to come into the home to work with the patient on Wednesdays and Fridays. The patients daughter is also going to see if she can get the patient to come to stay  with her for a few weeks.  Review of safety in the home. The patient has a life alert system in place. The patient did call her daughter when she felt numbness in her hand, arm, and leg. Will continue to monitor for changes, needs, or concerns. The patients daughter has the [facility] number to call for assistance or new needs.   Symptom Management: Take medications as prescribed   Attend all scheduled provider appointments Call provider office for new concerns or questions  call the Suicide and Crisis Lifeline: 988 call the [place] National Suicide Prevention Lifeline: 905-040-8558 or TTY: 816-235-3306 TTY 724-882-5080) to talk to a trained counselor call 1-800-273-TALK (toll free, 24 hour hotline) if experiencing a Mental Health or [place]   Follow Up Plan: Telephone follow up appointment with care management team member scheduled for: 03-01-2022 at 62 am       CCM Expected Outcome:  Monitor, Self-Manage, and Reduce Symptoms of Hypertension       Current Barriers:  Knowledge Deficits related to to the benefits of checking blood pressures and monitoring for changes in HTN and heart health  Care Coordination needs related to support and education post hospitalization  in a patient with HTN and other chronic conditions Chronic Disease Management support and education needs related to effective management of HTN BP Readings from Last 3 Encounters:  02/01/22 (!) 159/61  07/23/21 132/68  07/23/21 132/68     Planned Interventions: Evaluation of current treatment plan related to hypertension self management and patient's adherence to plan as established by provider. The patient post hospital stay for TIA. Has gone back on her blood pressure medication. Will see the pcp on 02-11-2022. Patients daughter is traveling to the patients home later today and will be staying with her. She hopes to take the patient to stay with her for a couple weeks or more. She states the patient likely will  want to stay in her home due to her being so independent. RNCM gave support and education to the patients daughter. Encouraged her to write down questions to ask the pcp at upcoming visit.;   Provided education to patient re: stroke prevention, s/s of heart attack and stroke. Review and education, patient recent hospital stay for TIA 01-30-2022 to 02-01-2022; Reviewed prescribed diet heart healthy/ADA diet. Education and support given Reviewed medications with patient and discussed importance of compliance. Endorses compliance with medications. The patient after discharge went on 02-02-2022 and picked up her medications from [facility];  Discussed plans with patient for ongoing care management follow up and provided patient with direct contact information for care management team; Advised patient, providing education and rationale, to monitor blood pressure daily and record, calling PCP for findings outside established parameters;  Reviewed scheduled/upcoming provider appointments including: 02-11-2022 at 2 pm. The patient has in the past cancelled several appointments with the pcp per the daughter. The daughter is coming in tonight to the patients home and will go with her to her upcoming appointment on 02-11-2022 Advised patient to discuss changes in blood pressures or heart health with provider; Provided education on prescribed diet heart healthy/ADA diet ;  Discussed complications of poorly controlled blood pressure such as heart disease, stroke, circulatory complications, vision complications, kidney impairment,  sexual dysfunction;  Screening for signs and symptoms of depression related to chronic disease state;  Assessed social determinant of health barriers;   Symptom Management: Take medications as prescribed   Attend all scheduled provider appointments Call provider office for new concerns or questions  call the Suicide and Crisis Lifeline: 988 call the [place] National Suicide Prevention Lifeline:  (716)883-1009 or TTY: (423) 874-9373 TTY (620)494-6720) to talk to a trained counselor call 1-800-273-TALK (toll free, 24 hour hotline) if experiencing a Mental Health or [place]  check blood pressure 3 times per week write blood pressure results in a log or diary learn about high blood pressure keep a blood pressure log take blood pressure log to all doctor appointments call doctor for signs and symptoms of high blood pressure develop an action plan for high blood pressure keep all doctor appointments take medications for blood pressure exactly as prescribed report new symptoms to your doctor  Follow Up Plan: Telephone follow up appointment with care management team member scheduled for: 03-01-2022 at 1030 am          Patient verbalizes understanding of instructions and care plan provided today and agrees to view in [place]. Active MyChart status and patient understanding of how to access instructions and care plan via MyChart confirmed with patient.     Telephone follow up appointment with care management team member scheduled for: 03-01-2022 at 7 am    Physical Therapy After a Stroke After a stroke, you may have physical changes or problems. Physical therapy can help you get better and deal with problems, such as: Weakness or not being able to move (paralysis). This may affect one side of your body. Trouble with balance. Stiffness in your muscles and joints. Changes to coordination and reflexes. Muscle tightening that you cannot control (spasticity). Pain, a prickling and tingling feeling, or numbness in parts of your body. You may also have trouble feeling touch, pressure, or changes in temperature. What causes physical problems after a stroke? A stroke can damage the parts of your brain that control how your body works. These include your ability to move and keep your balance. The types of problems you have will depend on how severe your stroke was and where  it was in your brain. Weakness or paralysis may affect just your fingers and hands, a leg or arm, or an entire side of your body. What is physical therapy? Physical therapy uses exercises, stretches, and activities to help you move better and regain independence after a stroke. It may focus on: Range of motion. This can help you move better and reduce muscle stiffness. Balance. This helps to lower your risk of falling. Position changes. These may include moving from sitting to standing or from a chair to a bed. Coordination. This may include getting an object from a shelf. Muscle strength. Weights, and repeated motions, can help to improve strength. Functional mobility. This may include learning how to go up or down stairs or how to use a wheelchair, walker, or cane. Gait training. This can help you learn how to walk better. Activities of daily living. This may include how to get out of a car or how to button a shirt. Why is physical therapy important? Physical therapy can: Help you regain independence. Stop you from falling and hurting yourself. Lower your risk of blood clots. Lower your risk of pressure injuries. These are also called skin sores. Increase your physical activity and exercise. This may help lower your risk for another  stroke. Help reduce pain. Do exercises and follow your rehabilitation, or rehab, plan as told by your physical therapist. When will therapy start and where will I have therapy? Your health care provider will decide when you can start therapy. In some cases, you may be able to start rehab, such as physical therapy, as soon as you are medically stable. Where rehab happens will depend on your needs. It may take place in: The hospital or an inpatient rehab hospital. An outpatient rehab facility. A long-term care facility. A community rehab clinic. Your home. What are assistive devices? Assistive devices are tools to help you move, keep your balance, and manage  daily tasks while you recover from a stroke. Your physical therapist may recommend and help you learn to use: Equipment to help you move. This may include a wheelchair, cane, or walker. Braces or splints. These can help keep your arms, hands, legs, or feet in a comfortable and safe position. Bathtub benches or grab bars. These can help keep you safe in the bathroom. Special utensils, bowls, and plates. These can allow you to eat with one hand. Use these devices as told by your health care provider. General information Do not use any products that contain nicotine or tobacco. These include cigarettes, chewing tobacco, and vaping devices, such as e-cigarettes. If you need help quitting, ask your health care provider. Keep all follow-up visits to make sure all your needs are being met and to catch any problems early. This information is not intended to replace advice given to you by your health care provider. Make sure you discuss any questions you have with your health care provider. Document Revised: 06/25/2021 Document Reviewed: 06/25/2021 Elsevier Patient Education  [place] After Stroke A stroke causes damage to brain cells, which can affect your ability to walk, talk, and eat. The impact of a stroke is different for everyone, and so is recovery. A good nutrition plan is important for your recovery. It can also lower your risk of another stroke. If you have difficulty chewing and swallowing your food, work with a dietitian or your stroke care team to make sure that you are eating healthy foods and getting all the nutrients you need. What are tips for following this plan? Reading food labels Choose foods that have less than 300 milligrams (mg) of salt (sodium) per serving. Limit your sodium intake to less than 1,500 mg per day. Avoid foods that have saturated fat and trans fat. Choose foods that are low in cholesterol. Limit the amount of cholesterol you eat each day to  less than 200 mg. Choose foods that are high in fiber. Eat 20-30 grams (g) of fiber each day. Avoid foods with added sugar. Check the food label for ingredients such as sugar, corn syrup, honey, fructose, molasses, and cane juice. Shopping Buy fresh, in-season fruits and vegetables from the grocery store or local farmers markets. Buy fresh seafood, poultry, lean meats, and eggs. Buy dry grains, beans, nuts, and seeds. Buy low-fat dairy products. Buy whole ingredients instead of prepackaged foods. Buy frozen fruits and vegetables in resealable bags. Cooking Prepare foods with very little salt. Use herbs or salt-free spices instead. Cook with heart-healthy oils, such as olive, avocado, canola, soybean, or sunflower oil. Avoid frying foods. Bake, grill, or broil foods instead. Remove visible fat and skin from meat and poultry before eating. Modify food textures as told by your health care provider. Meal planning  Eat a variety of colorful fruits and  vegetables. Make sure one-half of your plate is filled with fruits and vegetables at each meal. Eat fruits and vegetables that are high in potassium, such as: Apples, bananas, oranges, and melon. Sweet potatoes, spinach, zucchini, and tomatoes. Eat fish that contain heart-healthy fats (omega-3 fats) at least twice a week. These include salmon, tuna, mackerel, and sardines. Eat plant foods that are high in omega-3 fats, such as flaxseeds and walnuts. Add these to cereals, yogurt, or pasta dishes. Eat several servings of high-fiber foods each day, such as fruits, vegetables, whole grains, and beans. Do not put salt on the table for meals. If you have difficulty swallowing: Choose foods that are softer and easier to chew and swallow. Cut foods into small pieces and chew well before swallowing. Thicken liquids as told by your health care provider or dietitian. Let your health care provider know if your condition does not improve over time. You may  need to work with a speech therapist to retrain the muscles that are used for eating. Lifestyle When eating out at restaurants: Ask the server about low-salt or salt-free food options. Avoid fried foods. Look for menu items that are grilled, steamed, broiled, or roasted. Ask if your food can be prepared without butter. Ask for condiments such as salad dressings, gravy, or sauces to be served on the side. General recommendations Involve your family and friends in your recovery, if possible. It may be helpful to have a slower meal time and to plan meals that include foods everyone in the family can eat. Brush your teeth with fluoride toothpaste twice a day, and floss once a day. Keeping a clean mouth can help you swallow and can also help your appetite. Drink enough water to keep your urine pale yellow. If needed, set reminders or ask your family to help you remember to drink water. If you drink alcohol: Limit how much you have to: 0-1 drink a day for women who are not pregnant. 0-2 drinks a day for men. Know how much alcohol is in your drink. In the [place] one drink equals one 12 oz bottle of beer (355 mL), one 5 oz glass of wine (148 mL), or one 1 oz glass of hard liquor (44 mL). Summary Following this eating plan can help your stroke recovery and can decrease your risk of another stroke. Limit your salt (sodium) and cholesterol intake. Choose foods that are high in fiber. Let your health care provider know if you have problems with swallowing. You may need to work with a speech therapist. This information is not intended to replace advice given to you by your health care provider. Make sure you discuss any questions you have with your health care provider. Document Revised: 02/24/2021 Document Reviewed: 02/24/2021 Elsevier Patient Education  [place] After a Stroke Driving after a stroke can be dangerous because a stroke can cause physical, emotional, cognitive, and  behavioral changes. Damage to your brain and other parts of your nervous system may affect your ability to drive. You may have weakness, stiffness, and pain. You also may have problems moving, talking, seeing, touching, or problem-solving. A stroke can also cause an inability to move, or paralysis, on one side of your body. How does a stroke affect my driving? A family member may be the first to notice that it is not safe for you to drive. You may have problems with: Your vision. Talking and communicating. Weakness, pain, and stiffness in your arms or legs. Reacting quickly to changes  on the road. Using the steering wheel, pedals, and other parts of the car. Thinking while driving. Judgment on the road. How do I recognize changes in my ability to drive? If you do drive, signs that driving may be unsafe for you include: Driving too fast or too slow. Needing help from others while driving. Not paying attention to street signs or signals. Making bad decisions while driving. Not keeping enough distance between cars. Drifting into other lanes. Becoming confused, angry, or frustrated. Getting lost in familiar places. Having accidents while driving. What actions can I take to manage driving after a stroke?  Talk to your health care provider Ask your health care provider when it is safe for you to drive. Laws on driving after a stroke vary by state. Your health care provider may recommend that you: Get a driving evaluation to have your vision, thinking, reaction time, and driving skills tested. Take a driving rehabilitation program for people who have had a stroke. Take a driving class or a retraining program. Ask about safety devices for drivers Adaptive equipment refers to devices that can help people who have had a stroke to drive and do other activities. You may need: A wheelchair-accessible car. Special hand controls in the car. Pedal extensions for the car. A seat base to help you stay  positioned in your seat. Lifts and ramps to help you get in and out of the car. Where to find more information American Stroke Association: www.stroke.org Summary Damage to your brain and other parts of your nervous system may affect your ability to drive. Ask your health care provider when it is safe for you to drive. They may recommend that you get a driving evaluation or take a driving class. A family member may be the first to notice that it is not safe for you to drive. You may need adaptive equipment to drive safely. This information is not intended to replace advice given to you by your health care provider. Make sure you discuss any questions you have with your health care provider. Document Revised: 04/20/2021 Document Reviewed: 04/20/2021 Elsevier Patient Education  [place].  Stroke Prevention Some medical conditions and lifestyle choices can lead to a higher risk for a stroke. You can help to prevent a stroke by eating healthy foods and exercising. It also helps to not smoke and to manage any health problems you may have. How can this condition affect me? A stroke is an emergency. It should be treated right away. A stroke can lead to brain damage or threaten your life. There is a better chance of surviving and getting better after a stroke if you get medical help right away. What can increase my risk? The following medical conditions may increase your risk of a stroke: Diseases of the heart and blood vessels (cardiovascular disease). High blood pressure (hypertension). Diabetes. High cholesterol. Sickle cell disease. Problems with blood clotting. Being very overweight. Sleeping problems (obstructivesleep apnea). Other risk factors include: Being older than age 24. A history of blood clots, stroke, or mini-stroke (TIA). Race, ethnic background, or a family history of stroke. Smoking or using tobacco products. Taking birth control pills, especially if you  smoke. Heavy alcohol and drug use. Not being active. What actions can I take to prevent this? Manage your health conditions High cholesterol. Eat a healthy diet. If this is not enough to manage your cholesterol, you may need to take medicines. Take medicines as told by your doctor. High blood pressure. Try to  keep your blood pressure below 130/80. If your blood pressure cannot be managed through a healthy diet and regular exercise, you may need to take medicines. Take medicines as told by your doctor. Ask your doctor if you should check your blood pressure at home. Have your blood pressure checked every year. Diabetes. Eat a healthy diet and get regular exercise. If your blood sugar (glucose) cannot be managed through diet and exercise, you may need to take medicines. Take medicines as told by your doctor. Talk to your doctor about getting checked for sleeping problems. Signs of a problem can include: Snoring a lot. Feeling very tired. Make sure that you manage any other conditions you have. Nutrition  Follow instructions from your doctor about what to eat or drink. You may be told to: Eat and drink fewer calories each day. Limit how much salt (sodium) you use to 1,500 milligrams (mg) each day. Use only healthy fats for cooking, such as olive oil, canola oil, and sunflower oil. Eat healthy foods. To do this: Choose foods that are high in fiber. These include whole grains, and fresh fruits and vegetables. Eat at least 5 servings of fruits and vegetables a day. Try to fill one-half of your plate with fruits and vegetables at each meal. Choose low-fat (lean) proteins. These include low-fat cuts of meat, chicken without skin, fish, tofu, beans, and nuts. Eat low-fat dairy products. Avoid foods that: Are high in salt. Have saturated fat. Have trans fat. Have cholesterol. Are processed or pre-made. Count how many carbohydrates you eat and drink each day. Lifestyle If you drink  alcohol: Limit how much you have to: 0-1 drink a day for women who are not pregnant. 0-2 drinks a day for men. Know how much alcohol is in your drink. In the [place] one drink equals one 12 oz bottle of beer (366m), one 5 oz glass of wine (1444m, or one 1 oz glass of hard liquor (4475m Do not smoke or use any products that have nicotine or tobacco. If you need help quitting, ask your doctor. Avoid secondhand smoke. Do not use drugs. Activity  Try to stay at a healthy weight. Get at least 30 minutes of exercise on most days, such as: Fast walking. Biking. Swimming. Medicines Take over-the-counter and prescription medicines only as told by your doctor. Avoid taking birth control pills. Talk to your doctor about the risks of taking birth control pills if: You are over 35 41ars old. You smoke. You get very bad headaches. You have had a blood clot. Where to find more information [place] Stroke Association: www.strokeassociation.org Get help right away if: You or a loved one has any signs of a stroke. "BE FAST" is an easy way to remember the warning signs: B - Balance. Dizziness, sudden trouble walking, or loss of balance. E - Eyes. Trouble seeing or a change in how you see. F - Face. Sudden weakness or loss of feeling of the face. The face or eyelid may droop on one side. A - Arms. Weakness or loss of feeling in an arm. This happens all of a sudden and most often on one side of the body. S - Speech. Sudden trouble speaking, slurred speech, or trouble understanding what people say. T - Time. Time to call emergency services. Write down what time symptoms started. You or a loved one has other signs of a stroke, such as: A sudden, very bad headache with no known cause. Feeling like you may vomit (nausea). Vomiting. A seizure.  These symptoms may be an emergency. Get help right away. Call your local emergency services (911 in the [place] Do not wait to see if the symptoms will go  away. Do not drive yourself to the hospital. Summary You can help to prevent a stroke by eating healthy, exercising, and not smoking. It also helps to manage any health problems you have. Do not smoke or use any products that contain nicotine or tobacco. Get help right away if you or a loved one has any signs of a stroke. This information is not intended to replace advice given to you by your health care provider. Make sure you discuss any questions you have with your health care provider. Document Revised: 08/20/2019 Document Reviewed: 08/20/2019 Elsevier Patient Education  [place].

## 2022-02-09 ENCOUNTER — Encounter: Payer: Medicare Other | Admitting: *Deleted

## 2022-02-09 ENCOUNTER — Telehealth: Payer: Self-pay | Admitting: Family Medicine

## 2022-02-09 NOTE — Telephone Encounter (Signed)
Home Health Verbal Orders - Caller/Agency: [place] with [facility] Number: [phone] Requesting PT Frequency: 2 x a wk for 4 wks, 1 x wk 4 wks  Please assist further

## 2022-02-09 NOTE — Telephone Encounter (Signed)
Please proceed with verbal orders  Ellen [name], [facility] 02/09/2022, 2:44 PM

## 2022-02-10 ENCOUNTER — Inpatient Hospital Stay: Payer: Medicare Other | Admitting: Family Medicine

## 2022-02-11 ENCOUNTER — Inpatient Hospital Stay: Payer: Medicare Other | Admitting: Internal Medicine

## 2022-02-12 ENCOUNTER — Encounter: Payer: Self-pay | Admitting: Family Medicine

## 2022-02-12 ENCOUNTER — Ambulatory Visit (INDEPENDENT_AMBULATORY_CARE_PROVIDER_SITE_OTHER): Payer: Medicare Other | Admitting: Family Medicine

## 2022-02-12 VITALS — BP 120/66 | HR 75 | Ht 60.0 in | Wt 132.8 lb

## 2022-02-12 DIAGNOSIS — R7989 Other specified abnormal findings of blood chemistry: Secondary | ICD-10-CM | POA: Diagnosis not present

## 2022-02-12 DIAGNOSIS — I69359 Hemiplegia and hemiparesis following cerebral infarction affecting unspecified side: Secondary | ICD-10-CM | POA: Diagnosis not present

## 2022-02-12 DIAGNOSIS — I10 Essential (primary) hypertension: Secondary | ICD-10-CM

## 2022-02-12 DIAGNOSIS — I6930 Unspecified sequelae of cerebral infarction: Secondary | ICD-10-CM

## 2022-02-12 DIAGNOSIS — N6321 Unspecified lump in the left breast, upper outer quadrant: Secondary | ICD-10-CM

## 2022-02-12 NOTE — Progress Notes (Signed)
Subjective:    Patient ID: Ellen [name], female    DOB: 06/04/1929, 89 y.o.   MRN: [id number]  Ellen [name] is a 89 y.o. female presenting on 02/12/2022 for Hospitalization Follow-up and Cerebrovascular Accident   [place] VISIT  Hospital/Location: [facility] Date of Admission: 01/30/22 Date of Discharge: 1/124 Transitions of care telephone call: Completed by Reginia Naas RN  Reason for Admission: Stroke  [facility] H&P and Discharge Summary have been reviewed - Patient presents today 11 days after recent hospitalization. Brief summary of recent course, patient had symptoms of Right sided facial and upper extremity and some right lower extremity numbness weakness, hospitalized and imaging identified CT with stroke Followed by neurology in hospital. Discharged on adjusted medication.  Discharged to home with Home Health Physical Therapy.  Due for labs CBC CMET, CK today  Has not seen Neurology recently. Will need to return  - Today reports overall has done well after discharge. Symptoms of weakness have improved, still has some numbness.  Her vision remains poor with cataracts. Awaiting eye doctor.  - New medications on discharge: DAPT ASA + Plavix - Changes to current meds on discharge: Switched Pravastatin to Atorvastatin. Held BP med Amlodipine.  Additional concern L breast outer nodular lump, requesting imaging. Last had diagnostic 1 year ago. She is breast cancer survivor, L side  I have reviewed the discharge medication list, and have reconciled the current and discharge medications today.   Current Outpatient Medications:    Accu-Chek Softclix Lancets lancets, Use to check blood sugar up to twice daily, Disp: 200 each, Rfl: 3   acetaminophen (TYLENOL) 500 MG tablet, Take 1,000 mg by mouth 2 (two) times daily as needed for mild pain or moderate pain., Disp: , Rfl:    aspirin 81 MG chewable tablet, Chew 81 mg by mouth daily. , Disp: , Rfl:    atorvastatin  (LIPITOR) 40 MG tablet, Take 1 tablet (40 mg total) by mouth daily., Disp: 30 tablet, Rfl: 0   Blood Glucose Monitoring Suppl (ACCU-CHEK GUIDE) w/Device KIT, Use as directed to monitor blood sugar, Disp: 1 kit, Rfl: 0   cloNIDine (CATAPRES) 0.1 MG tablet, TAKE 1 TABLET BY MOUTH DAILY AS NEEDED FOR ELEVATED BLOOD PRESSURE GREATER THAN 180/100/HEADACHE, Disp: 30 tablet, Rfl: 2   clopidogrel (PLAVIX) 75 MG tablet, Take 1 tablet (75 mg total) by mouth daily., Disp: 30 tablet, Rfl: 0   glucose blood (ACCU-CHEK GUIDE) test strip, Use to check blood sugar up to twice daily, Disp: 200 each, Rfl: 3   Lancets (ONETOUCH ULTRASOFT) lancets, Use to check blood sugar daily, up to 2 times if need, Disp: 100 each, Rfl: 3   lisinopril-hydrochlorothiazide (ZESTORETIC) 20-12.5 MG tablet, Take 1 tablet by mouth daily., Disp: 90 tablet, Rfl: 3   Multiple Vitamins-Minerals (CERTAVITE SENIOR/ANTIOXIDANT) TABS, Take 1 tablet by mouth daily., Disp: , Rfl:   ------------------------------------------------------------------------- Social History   Tobacco Use   Smoking status: Never   Smokeless tobacco: Never  Vaping Use   Vaping Use: Never used  Substance Use Topics   Alcohol use: Never   Drug use: Never    Review of Systems Per HPI unless specifically indicated above     Objective:    BP 120/66   Pulse 75   Ht 5' (1.524 m)   Wt 132 lb 12.8 oz (60.2 kg)   SpO2 98%   BMI 25.94 kg/m   Wt Readings from Last 3 Encounters:  02/12/22 132 lb 12.8 oz (60.2 kg)  07/23/21 132 lb (59.9 kg)  07/23/21 132 lb (59.9 kg)    Physical Exam Vitals and nursing note reviewed.  Constitutional:      General: She is not in acute distress.    Appearance: She is well-developed. She is not diaphoretic.     Comments: Well-appearing, comfortable, cooperative  HENT:     Head: Normocephalic and atraumatic.  Eyes:     General:        Right eye: No discharge.        Left eye: No discharge.     Conjunctiva/sclera:  Conjunctivae normal.  Neck:     Thyroid: No thyromegaly.  Cardiovascular:     Rate and Rhythm: Normal rate and regular rhythm.     Heart sounds: Normal heart sounds. No murmur heard. Pulmonary:     Effort: Pulmonary effort is normal. No respiratory distress.     Breath sounds: Normal breath sounds. No wheezing or rales.  Musculoskeletal:        General: Normal range of motion.     Cervical back: Normal range of motion and neck supple.  Lymphadenopathy:     Cervical: No cervical adenopathy.  Skin:    General: Skin is warm and dry.     Findings: No erythema or rash.  Neurological:     General: No focal deficit present.     Mental Status: She is alert and oriented to person, place, and time. Mental status is at baseline.     Cranial Nerves: No cranial nerve deficit.     Motor: No weakness.     Comments: Right grip with normal strength 5/5 intact, mild reduced sensation R upper extremity to touch  Bilateral lower extremity intact strength 5/5  Psychiatric:        Behavior: Behavior normal.     Comments: Well groomed, good eye contact, normal speech and thoughts     I have personally reviewed the radiology report from 01/30/22.  CLINICAL DATA:  Code stroke. Right-sided weakness. Last known well 8 p.m. last night.   EXAM: CT HEAD WITHOUT CONTRAST   TECHNIQUE: Contiguous axial images were obtained from the base of the skull through the vertex without intravenous contrast.   RADIATION DOSE REDUCTION: This exam was performed according to the departmental dose-optimization program which includes automated exposure control, adjustment of the mA and/or kV according to patient size and/or use of iterative reconstruction technique.   COMPARISON:  CT of the head without contrast 08/09/2020   FINDINGS: Brain: Focal hypoattenuation along the lateral left thalamus and posterior limb internal capsule is new since the prior exam. Basal ganglia are intact. Insular ribbon is normal. No  acute or focal cortical abnormality is present. Mild atrophy and white matter changes are similar the prior exam. Remote infarcts are present in the posterior left cerebellum.   No acute hemorrhage or mass lesion is present. The ventricles are of normal size. No significant extraaxial fluid collection is present.   Vascular: Atherosclerotic calcifications present within the cavernous internal carotid arteries bilaterally, left greater than right, similar the prior exam. Calcifications extend into the left ICA terminus and proximal left M1 segment. No hyperdense vessel is present.   Skull: Calvarium is intact. No focal lytic or blastic lesions are present. No significant extracranial soft tissue lesion is present.   Sinuses/Orbits: The paranasal sinuses and mastoid air cells are clear. The globes and orbits are within normal limits.   ASPECTS [facility] Stroke Program Early CT Score)   - Ganglionic level infarction (  caudate, lentiform nuclei, internal capsule, insula, M1-M3 cortex): 6/7   - Supraganglionic infarction (M4-M6 cortex): 3/3   Total score (0-10 with 10 being normal): 9/10   IMPRESSION: 1. Focal hypoattenuation along the lateral left thalamus and posterior limb internal capsule is new since the prior exam. This is consistent with an age indeterminate infarct, acute infarct is not excluded. 2. Remote infarcts in the posterior left cerebellum. 3. Stable mild atrophy and white matter disease. This likely reflects the sequela of chronic microvascular ischemia. 4. Aspects is 9/10.   The above was relayed via text pager to Dr. Kerney Elbe on 01/30/2022 at 11:15. Age     Electronically Signed   By: San Morelle M.D.   On: 01/30/2022 11:16  --------  CLINICAL DATA:  Weakness   EXAM: PORTABLE CHEST 1 VIEW   COMPARISON:  12/11/2019   FINDINGS: Cardiomegaly. Aortic atherosclerosis. Similar rightward deviation of the trachea related to known multinodular  goiter. No focal airspace consolidation, pleural effusion, or pneumothorax.   IMPRESSION: No active disease.     Electronically Signed   By: Davina Poke D.O.   On: 01/30/2022 13:37  -------------------------------------------------  CLINICAL DATA:  Right-sided facial droop. Right upper and lower extremity weakness. Left thalamic infarct.   EXAM: MRA HEAD WITHOUT CONTRAST   TECHNIQUE: Angiographic images of the Circle of Willis were acquired using MRA technique without intravenous contrast.   COMPARISON:  MR head without and with contrast 01/31/2022.   FINDINGS: Anterior circulation: Atherosclerotic calcifications are present within the cavernous internal carotid arteries bilaterally. Signal loss in the Periophthalmic and supraclinoid right ICA is concerning for high-grade stenosis. Diffuse irregularity is present in the same segments on the left without focal stenosis. Signal loss within the cavernous ICA proximal to the genu on both sides may be artifactual.   The left A1 segment is dominant. The anterior communicating artery is patent. The anterior communicating artery is dilated up to 3 mm on the left. M1 segments are normal bilaterally. MCA bifurcations are normal. ACA and MCA branch vessels are within normal limits bilaterally.   Posterior circulation: Right PICA origin is visualized normal. The right vertebral artery is dominant. Fenestration is present at the vertebrobasilar junction. The basilar artery is normal. Both posterior cerebral arteries originate from basilar tip. Posterior communicating arteries contribute. Moderate stenosis is present in the right P2 segment. Distal PCA branch vessels are within normal limits bilaterally.   Anatomic variants: None   Other: None.   IMPRESSION: 1. Signal loss in the Periophthalmic and supraclinoid right ICA is concerning for high-grade stenosis. 2. Diffuse atherosclerotic irregularity in the same segments on  the left without focal stenosis. 3. Signal loss within the cavernous ICA proximal to the genu may be artifactual. 4. Moderate stenosis of the right P2 segment. 5. Dilated anterior communicating artery up to 3 mm on the left. This likely represents an infundibulum.     Electronically Signed   By: San Morelle M.D.   On: 01/31/2022 12:59   CLINICAL DATA:  Neuro deficit, acute, stroke suspected. Acute onset of right-sided facial droop with right upper and lower extremity weakness.   EXAM: MRI HEAD WITHOUT AND WITH CONTRAST   TECHNIQUE: Multiplanar, multiecho pulse sequences of the brain and surrounding structures were obtained without and with intravenous contrast.   CONTRAST:  6.80m GADAVIST GADOBUTROL 1 MMOL/ML IV SOLN   COMPARISON:  CT head without contrast 01/30/2022. MR head without and with contrast 12/18/2019   FINDINGS: Brain: The diffusion-weighted images confirm  an acute nonhemorrhagic 6 mm infarct of the lateral left thalamus no other acute infarcts are present. T2 and FLAIR hyperintensity are associated with the acute infarct. Periventricular and T2 hyperintensities are otherwise stable. Subcortical scratched at asymmetric subcortical T2 hyperintensity in the left subinsular white matter is stable. Remote lacunar infarcts of the left cerebellum are stable. Remote right occipital infarct is stable.   Dilated perivascular spaces are present within the basal ganglia. The ventricles are proportionate to the degree of atrophy. No significant extraaxial fluid collection is present.   Vascular: Flow is present in the major intracranial arteries.   Skull and upper cervical spine: Degenerative changes are present the upper cervical spine, similar the prior exam. Craniocervical junction is within normal limits. Marrow signal is normal. Midline structures are unremarkable.   Sinuses/Orbits: The paranasal sinuses and mastoid air cells are clear. The globes and  orbits are within normal limits.   IMPRESSION: 1. Acute nonhemorrhagic 6 mm infarct of the lateral left thalamus. 2. Stable atrophy and white matter disease. This likely reflects the sequela of chronic microvascular ischemia. 3. Remote infarcts of the left cerebellum and right occipital lobe are stable.     Electronically Signed   By: San Morelle M.D.   On: 01/31/2022 12:52  -----------------------------------------------------  CLINICAL DATA:  89 year old female with history of stroke.   EXAM: BILATERAL CAROTID DUPLEX ULTRASOUND   TECHNIQUE: Pearline Cables scale imaging, color Doppler and duplex ultrasound were performed of bilateral carotid and vertebral arteries in the neck.   COMPARISON:  None Available.   FINDINGS: Criteria: Quantification of carotid stenosis is based on velocity parameters that correlate the residual internal carotid diameter with NASCET-based stenosis levels, using the diameter of the distal internal carotid lumen as the denominator for stenosis measurement.   The following velocity measurements were obtained:   RIGHT   ICA: Peak systolic velocity 62 cm/sec, End diastolic velocity 18 cm/sec   CCA: Peak systolic velocity 53 cm/sec   SYSTOLIC ICA/CCA RATIO:  1.2   ECA: Peak systolic velocity 70 cm/sec   LEFT   ICA: Peak systolic velocity 82 cm/sec, End diastolic velocity 18 cm/sec   CCA: 77 cm/sec   SYSTOLIC ICA/CCA RATIO:  1.1   ECA: 77 cm/sec   RIGHT CAROTID ARTERY: Mild multifocal atherosclerotic plaque formation, most prominent at the carotid bulb. No significant tortuosity. Normal low resistance waveforms.   RIGHT VERTEBRAL ARTERY:  Antegrade flow.   LEFT CAROTID ARTERY: Mild multifocal atherosclerotic plaque formation. No significant tortuosity. Normal low resistance waveforms.   LEFT VERTEBRAL ARTERY:  Antegrade flow.   Upper extremity non-invasive blood pressures:   Not obtained.   IMPRESSION: 1. Right carotid  artery system: Less than 50% stenosis secondary to mild multifocal atherosclerotic plaque formation.   2. Left carotid artery system: Less than 50% stenosis secondary to mild multifocal atherosclerotic plaque formation.   3.  Vertebral artery system: Patent with antegrade flow bilaterally.   Ruthann Cancer, MD   [facility]   [facility]     Electronically Signed   By: Ruthann Cancer M.D.   On: 01/31/2022 19:55    Results for orders placed or performed during the hospital encounter of 01/30/22  Protime-INR  Result Value Ref Range   Prothrombin Time 14.2 11.4 - 15.2 seconds   INR 1.1 0.8 - 1.2  APTT  Result Value Ref Range   aPTT 28 24 - 36 seconds  CBC  Result Value Ref Range   WBC 4.4 4.0 -  10.5 K/uL   RBC 3.65 (L) 3.87 - 5.11 MIL/uL   Hemoglobin 9.7 (L) 12.0 - 15.0 g/dL   HCT 31.4 (L) 36.0 - 46.0 %   MCV 86.0 80.0 - 100.0 fL   MCH 26.6 26.0 - 34.0 pg   MCHC 30.9 30.0 - 36.0 g/dL   RDW 16.1 (H) 11.5 - 15.5 %   Platelets 230 150 - 400 K/uL   nRBC 0.0 0.0 - 0.2 %  Differential  Result Value Ref Range   Neutrophils Relative % 42 %   Neutro Abs 1.8 1.7 - 7.7 K/uL   Lymphocytes Relative 45 %   Lymphs Abs 1.9 0.7 - 4.0 K/uL   Monocytes Relative 10 %   Monocytes Absolute 0.4 0.1 - 1.0 K/uL   Eosinophils Relative 2 %   Eosinophils Absolute 0.1 0.0 - 0.5 K/uL   Basophils Relative 1 %   Basophils Absolute 0.1 0.0 - 0.1 K/uL   Immature Granulocytes 0 %   Abs Immature Granulocytes 0.01 0.00 - 0.07 K/uL  Comprehensive metabolic panel  Result Value Ref Range   Sodium 137 135 - 145 mmol/L   Potassium 4.2 3.5 - 5.1 mmol/L   Chloride 103 98 - 111 mmol/L   CO2 25 22 - 32 mmol/L   Glucose, Bld 90 70 - 99 mg/dL   BUN 21 8 - 23 mg/dL   Creatinine, Ser 1.40 (H) 0.44 - 1.00 mg/dL   Calcium 8.8 (L) 8.9 - 10.3 mg/dL   Total Protein 7.0 6.5 - 8.1 g/dL   Albumin 3.3 (L) 3.5 - 5.0 g/dL   AST 21 15 - 41 U/L   ALT 10 0 - 44 U/L    Alkaline Phosphatase 39 38 - 126 U/L   Total Bilirubin 0.6 0.3 - 1.2 mg/dL   GFR, Estimated 36 (L) >60 mL/min   Anion gap 9 5 - 15  Ethanol  Result Value Ref Range   Alcohol, Ethyl (B) <10 <10 mg/dL  Urinalysis, Routine w reflex microscopic  Result Value Ref Range   Color, Urine STRAW (A) YELLOW   APPearance CLEAR (A) CLEAR   Specific Gravity, Urine 1.006 1.005 - 1.030   pH 7.0 5.0 - 8.0   Glucose, UA NEGATIVE NEGATIVE mg/dL   Hgb urine dipstick NEGATIVE NEGATIVE   Bilirubin Urine NEGATIVE NEGATIVE   Ketones, ur NEGATIVE NEGATIVE mg/dL   Protein, ur NEGATIVE NEGATIVE mg/dL   Nitrite NEGATIVE NEGATIVE   Leukocytes,Ua NEGATIVE NEGATIVE   WBC, UA 0-5 0 - 5 WBC/hpf   Bacteria, UA NONE SEEN NONE SEEN   Squamous Epithelial / HPF 0-5 0 - 5 /HPF  Urine Drug Screen, Qualitative ([facility] only)  Result Value Ref Range   Tricyclic, Ur Screen NONE DETECTED NONE DETECTED   Amphetamines, Ur Screen NONE DETECTED NONE DETECTED   MDMA (Ecstasy)Ur Screen NONE DETECTED NONE DETECTED   Cocaine Metabolite,Ur [place] NONE DETECTED NONE DETECTED   Opiate, Ur Screen NONE DETECTED NONE DETECTED   Phencyclidine (PCP) Ur S NONE DETECTED NONE DETECTED   Cannabinoid 50 Ng, Ur [place] NONE DETECTED NONE DETECTED   Barbiturates, Ur Screen NONE DETECTED NONE DETECTED   Benzodiazepine, Ur Scrn NONE DETECTED NONE DETECTED   Methadone Scn, Ur NONE DETECTED NONE DETECTED  Lipid panel  Result Value Ref Range   Cholesterol 178 0 - 200 mg/dL   Triglycerides 45 <150 mg/dL   HDL 56 >40 mg/dL   Total CHOL/HDL Ratio 3.2 RATIO   VLDL 9 0 - 40 mg/dL  LDL Cholesterol 113 (H) 0 - 99 mg/dL  Hemoglobin A1c  Result Value Ref Range   Hgb A1c MFr Bld 6.0 (H) 4.8 - 5.6 %   Mean Plasma Glucose 126 mg/dL  Glucose, capillary  Result Value Ref Range   Glucose-Capillary 83 70 - 99 mg/dL  Glucose, capillary  Result Value Ref Range   Glucose-Capillary 78 70 - 99 mg/dL  Glucose, capillary  Result Value Ref Range   Glucose-Capillary 69  (L) 70 - 99 mg/dL  Glucose, capillary  Result Value Ref Range   Glucose-Capillary 85 70 - 99 mg/dL  Glucose, capillary  Result Value Ref Range   Glucose-Capillary 116 (H) 70 - 99 mg/dL  Glucose, capillary  Result Value Ref Range   Glucose-Capillary 86 70 - 99 mg/dL  Glucose, capillary  Result Value Ref Range   Glucose-Capillary 66 (L) 70 - 99 mg/dL  CBG monitoring, ED  Result Value Ref Range   Glucose-Capillary 110 (H) 70 - 99 mg/dL   Comment 1 Notify RN    Comment 2 Document in Chart   ECHOCARDIOGRAM COMPLETE  Result Value Ref Range   Height 60 in   BP 158/73 mmHg   Ao pk vel 2.27 m/s   AV Area VTI 1.47 cm2   AR max vel 1.41 cm2   AV Mean grad 12.0 mmHg   AV Peak grad 20.6 mmHg   S' Lateral 1.80 cm   AV Area mean vel 1.28 cm2   Area-P 1/2 3.40 cm2   P 1/2 time 563 msec  Troponin I (High Sensitivity)  Result Value Ref Range   Troponin I (High Sensitivity) 9 <18 ng/L  Troponin I (High Sensitivity)  Result Value Ref Range   Troponin I (High Sensitivity) 8 <18 ng/L      Assessment & Plan:   Problem List Items Addressed This Visit     Essential hypertension   Relevant Orders   COMPLETE METABOLIC PANEL WITH GFR   CBC with Differential/Platelet   Hemiparesis affecting dominant side as late effect of cerebrovascular accident (CVA) ([place])   Relevant Orders   Ambulatory referral to Neurology   COMPLETE METABOLIC PANEL WITH GFR   CBC with Differential/Platelet   CK   History of CVA with residual deficit - Primary   Relevant Orders   Ambulatory referral to Neurology   COMPLETE METABOLIC PANEL WITH GFR   CBC with Differential/Platelet   CK   Other Visit Diagnoses     Elevated serum creatinine       Relevant Orders   COMPLETE METABOLIC PANEL WITH GFR   Mass of upper outer quadrant of left breast       Relevant Orders   MM DIAG BREAST TOMO BILATERAL   US BREAST LTD UNI LEFT INC AXILLA      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  Continue Home Health and family caregiver support  Adjusted BP medications in hospital Remains off Amlodipine Continue other therapy w HYPERTENSION meds  Labs ordered today for outpatient follow-up as requested.  History of Breast Cancer L L breast nodular density Diagnostic Mammogram + [place] ordered L sided  [place] at [facility] [place], [address] [place], [place] [address] Phone: [phone]   Orders Placed  This Encounter  Procedures   MM DIAG BREAST TOMO BILATERAL    Standing Status:   Future    Standing Expiration Date:   08/13/2022    Order Specific Question:   Reason for Exam (SYMPTOM  OR DIAGNOSIS REQUIRED)    Answer:   left breast lump, prior history left breast cancer    Order Specific Question:   Preferred imaging location?    Answer:   [facility]   US BREAST LTD UNI LEFT INC AXILLA    Standing Status:   Future    Standing Expiration Date:   08/13/2022    Order Specific Question:   Reason for Exam (SYMPTOM  OR DIAGNOSIS REQUIRED)    Answer:   left breast lump discomfort. History of left breast cancer    Order Specific Question:   Preferred imaging location?    Answer:   [facility]   COMPLETE METABOLIC PANEL WITH GFR   CBC with Differential/Platelet   CK   Ambulatory referral to Neurology    Referral Priority:   Routine    Referral Type:   Consultation    Referral Reason:   Specialty Services Required    Requested Specialty:   Neurology    Number of Visits Requested:   1     No orders of the defined types were placed in this encounter.   Follow up plan: Return in about 3 months (around 05/14/2022) for 3 month follow-up DM A1c, updates Neuro/Mammo/Eye.   [name], [facility] 02/12/2022, 10:40 AM

## 2022-02-12 NOTE — Patient Instructions (Addendum)
Thank you for coming to the office today.  Referral has been placed. Please wait to hear from them to schedule w/ Neurology locally in [place].  If not heard back in 1-2 weeks, you may call them to schedule.  Ray Church, MD  [phone] [address]  [facility]  [place], [place] [address]  [phone]    Vickki Hearing, [place]  [facility]  [place], [place] [address]  [phone]   ----------------  Labs today ordered.   [place] at [facility] [address], [address] [place], [place] [address] Phone: [phone]   ---------------  [facility]   Address: [address] [place], [place] [address] Phone: [phone]  Website: [email or web address] [address] [place], [place] [address] Phone: [phone] [email or web address]  [facility]  Address: [address], [place], [place] [address] Phone: [phone]   [facility] [address] [place], [place] [address] Phone: [phone]  [facility] Address: [place], [place], [place] [address]  Phone: [phone]     Please schedule a Follow-up Appointment to: Return in about 3 months (around 05/14/2022) for 3 month follow-up DM A1c, updates Neuro/Mammo/Eye.  If you have any other questions or concerns, please feel free to call the office or send a message through [place]. You may also schedule an earlier appointment if necessary.  Additionally, you may be receiving a survey about your experience at our office within a few days to 1 week by e-mail or mail. We value your feedback.  [name], DO [place]

## 2022-02-12 NOTE — Addendum Note (Signed)
Addended by: [name] on: 02/12/2022 02:31 PM   Modules accepted: Orders

## 2022-02-13 LAB — COMPLETE METABOLIC PANEL WITH GFR
AG Ratio: 1 (calc) (ref 1.0–2.5)
ALT: 11 U/L (ref 6–29)
AST: 22 U/L (ref 10–35)
Albumin: 3.8 g/dL (ref 3.6–5.1)
Alkaline phosphatase (APISO): 47 U/L (ref 37–153)
BUN/Creatinine Ratio: 21 (calc) (ref 6–22)
BUN: 31 mg/dL — ABNORMAL HIGH (ref 7–25)
CO2: 27 mmol/L (ref 20–32)
Calcium: 9.2 mg/dL (ref 8.6–10.4)
Chloride: 102 mmol/L (ref 98–110)
Creat: 1.48 mg/dL — ABNORMAL HIGH (ref 0.60–0.95)
Globulin: 3.7 g/dL (calc) (ref 1.9–3.7)
Glucose, Bld: 90 mg/dL (ref 65–99)
Potassium: 4.7 mmol/L (ref 3.5–5.3)
Sodium: 138 mmol/L (ref 135–146)
Total Bilirubin: 0.4 mg/dL (ref 0.2–1.2)
Total Protein: 7.5 g/dL (ref 6.1–8.1)
eGFR: 33 mL/min/{1.73_m2} — ABNORMAL LOW (ref >=60)

## 2022-02-13 LAB — CBC WITH DIFFERENTIAL/PLATELET
Absolute Monocytes: 397 cells/uL (ref 200–950)
Basophils Absolute: 29 cells/uL (ref 0–200)
Basophils Relative: 0.6 %
Eosinophils Absolute: 152 cells/uL (ref 15–500)
Eosinophils Relative: 3.1 %
HCT: 34.1 % — ABNORMAL LOW (ref 35.0–45.0)
Hemoglobin: 11 g/dL — ABNORMAL LOW (ref 11.7–15.5)
Lymphs Abs: 2328 cells/uL (ref 850–3900)
MCH: 27.6 pg (ref 27.0–33.0)
MCHC: 32.3 g/dL (ref 32.0–36.0)
MCV: 85.5 fL (ref 80.0–100.0)
MPV: 10.4 fL (ref 7.5–12.5)
Monocytes Relative: 8.1 %
Neutro Abs: 1994 cells/uL (ref 1500–7800)
Neutrophils Relative %: 40.7 %
Platelets: 313 10*3/uL (ref 140–400)
RBC: 3.99 10*6/uL (ref 3.80–5.10)
RDW: 14.4 % (ref 11.0–15.0)
Total Lymphocyte: 47.5 %
WBC: 4.9 10*3/uL (ref 3.8–10.8)

## 2022-02-13 LAB — CK: Total CK: 90 U/L (ref 29–143)

## 2022-02-22 ENCOUNTER — Ambulatory Visit (INDEPENDENT_AMBULATORY_CARE_PROVIDER_SITE_OTHER): Payer: Medicare Other

## 2022-02-22 DIAGNOSIS — I10 Essential (primary) hypertension: Secondary | ICD-10-CM

## 2022-02-22 DIAGNOSIS — I6930 Unspecified sequelae of cerebral infarction: Secondary | ICD-10-CM

## 2022-02-22 NOTE — Chronic Care Management (AMB) (Signed)
   02/22/2022  Ellen [name] [birth year] [id number]   Incoming call from the patients daughter with voicemail asking for a call back to the patients daughter [name].  Call made to the patients daughter and the daughter states that the OT had filled her mothers pill box and threw away the pill containers and her mother needs refills and she does not know what needs to be refilled. The daughter has called the office and secured and appointment for 02-24-2022 to see Dr. Raliegh Ip and get prescription refills that are needed. No other needs at this time. Will continue to monitor for changes.   Marland Kitchenpt

## 2022-02-22 NOTE — Patient Instructions (Signed)
Please call the care guide team at [phone] if you need to cancel or reschedule your appointment.   If you are experiencing a Mental Health or [place] or need someone to talk to, please call the Suicide and Crisis Lifeline: 988 call the [place] National Suicide Prevention Lifeline: 747-100-1374 or TTY: 361-273-4481 TTY (603) 495-9967) to talk to a trained counselor call 1-800-273-TALK (toll free, 24 hour hotline)   Following is a copy of the CCM Program Consent:  CCM service includes personalized support from designated clinical staff supervised by the physician, including individualized plan of care and coordination with other care providers 24/7 contact phone numbers for assistance for urgent and routine care needs. Service will only be billed when office clinical staff spend 20 minutes or more in a month to coordinate care. Only one practitioner may furnish and bill the service in a calendar month. The patient may stop CCM services at amy time (effective at the end of the month) by phone call to the office staff. The patient will be responsible for cost sharing (co-pay) or up to 20% of the service fee (after annual deductible is met)  Following is a copy of your full provider care plan:  Note for follow up for medication needs.   Patient verbalizes understanding of instructions and care plan provided today and agrees to view in [place]. Active MyChart status and patient understanding of how to access instructions and care plan via MyChart confirmed with patient.     Telephone follow up appointment with care management team member scheduled for: as scheduled

## 2022-02-23 ENCOUNTER — Other Ambulatory Visit: Payer: Medicare Other

## 2022-02-23 ENCOUNTER — Inpatient Hospital Stay: Admission: RE | Admit: 2022-02-23 | Payer: Medicare Other | Source: Ambulatory Visit

## 2022-02-24 ENCOUNTER — Encounter: Payer: Self-pay | Admitting: Family Medicine

## 2022-02-24 ENCOUNTER — Ambulatory Visit (INDEPENDENT_AMBULATORY_CARE_PROVIDER_SITE_OTHER): Payer: Medicare Other | Admitting: Family Medicine

## 2022-02-24 VITALS — BP 130/84 | HR 57 | Ht 60.0 in | Wt 130.4 lb

## 2022-02-24 DIAGNOSIS — I6930 Unspecified sequelae of cerebral infarction: Secondary | ICD-10-CM | POA: Diagnosis not present

## 2022-02-24 DIAGNOSIS — I69359 Hemiplegia and hemiparesis following cerebral infarction affecting unspecified side: Secondary | ICD-10-CM

## 2022-02-24 DIAGNOSIS — I10 Essential (primary) hypertension: Secondary | ICD-10-CM

## 2022-02-24 DIAGNOSIS — E785 Hyperlipidemia, unspecified: Secondary | ICD-10-CM

## 2022-02-24 DIAGNOSIS — E1169 Type 2 diabetes mellitus with other specified complication: Secondary | ICD-10-CM | POA: Diagnosis not present

## 2022-02-24 MED ORDER — CLOPIDOGREL BISULFATE 75 MG PO TABS: 75.0000 mg | ORAL_TABLET | Freq: Every day | ORAL | 3 refills | Status: DC

## 2022-02-24 MED ORDER — ATORVASTATIN CALCIUM 40 MG PO TABS: 40.0000 mg | ORAL_TABLET | Freq: Every day | ORAL | 3 refills | Status: DC

## 2022-02-24 MED ORDER — LISINOPRIL-HYDROCHLOROTHIAZIDE 20-12.5 MG PO TABS: 1.0000 | ORAL_TABLET | Freq: Every day | ORAL | 3 refills | Status: DC

## 2022-02-24 NOTE — Progress Notes (Signed)
Subjective:    Patient ID: Ellen [name], female    DOB: 1929-11-21, 89 y.o.   MRN: [id number]  Ellen [name] is a 89 y.o. female presenting on 02/24/2022 for Diabetes and Hypertension   HPI  History of CVA with residual deficit Hypertension Last visit with me 02/12/22 Referred to Neurology. Seen initial consult 02/22/22 Continues on ASA + Plavix, Atorvastatin BP has been controlled Return 6 months, continue PT  Left Breast Mass / Pain Note she was scheduled for diagnostic mammo yesterday 02/23/22 but she did not show, some confusion on this.  Additional concern L breast outer nodular lump, requesting imaging. Last had diagnostic 1 year ago. She is breast cancer survivor, L side  Left Knee Osteoarthritis Left Knee Instability Admits issue with arthritis in knees both sides, worse on Left now it causes her to have fall or other issue. Right Knee s/p total replacement arthroplasty Left knee x-ray 05/2021 from recent fall and ED visit.  Needs handicap placard   Type 2 Diabetes Last A1c 6.0 She is diet controlled, not on med. Doing well      02/08/2022    2:36 PM 07/23/2021   10:15 AM 02/16/2021    9:50 AM  Depression screen PHQ 2/9  Decreased Interest 0 0 0  Down, Depressed, Hopeless 0 1 0  PHQ - 2 Score 0 1 0  Altered sleeping   0  Tired, decreased energy   0  Change in appetite   0  Feeling bad or failure about yourself    0  Trouble concentrating   0  Moving slowly or fidgety/restless   0  Suicidal thoughts   0  PHQ-9 Score   0  Difficult doing work/chores   Not difficult at all    Social History   Tobacco Use   Smoking status: Never   Smokeless tobacco: Never  Vaping Use   Vaping Use: Never used  Substance Use Topics   Alcohol use: Never   Drug use: Never    Review of Systems Per HPI unless specifically indicated above     Objective:    BP 130/84   Pulse (!) 57   Ht 5' (1.524 m)   Wt 130 lb 6.4 oz (59.1 kg)   SpO2 100%   BMI 25.47 kg/m   Wt  Readings from Last 3 Encounters:  02/24/22 130 lb 6.4 oz (59.1 kg)  02/12/22 132 lb 12.8 oz (60.2 kg)  07/23/21 132 lb (59.9 kg)    Physical Exam Vitals and nursing note reviewed.  Constitutional:      General: She is not in acute distress.    Appearance: She is well-developed. She is not diaphoretic.     Comments: Well-appearing, comfortable, cooperative  HENT:     Head: Normocephalic and atraumatic.  Eyes:     General:        Right eye: No discharge.        Left eye: No discharge.     Conjunctiva/sclera: Conjunctivae normal.  Neck:     Thyroid: No thyromegaly.  Cardiovascular:     Rate and Rhythm: Normal rate and regular rhythm.     Heart sounds: Normal heart sounds. No murmur heard. Pulmonary:     Effort: Pulmonary effort is normal. No respiratory distress.     Breath sounds: Normal breath sounds. No wheezing or rales.  Musculoskeletal:        General: Normal range of motion.     Cervical back: Normal range of motion and  neck supple.  Lymphadenopathy:     Cervical: No cervical adenopathy.  Skin:    General: Skin is warm and dry.     Findings: No erythema or rash.  Neurological:     General: No focal deficit present.     Mental Status: She is alert and oriented to person, place, and time. Mental status is at baseline.     Cranial Nerves: No cranial nerve deficit.     Motor: No weakness.     Comments: Right grip with normal strength 5/5 intact, mild reduced sensation R upper extremity to touch  Bilateral lower extremity intact strength 5/5  Psychiatric:        Behavior: Behavior normal.     Comments: Well groomed, good eye contact, normal speech and thoughts    Results for orders placed or performed in visit on 02/12/22  COMPLETE METABOLIC PANEL WITH GFR  Result Value Ref Range   Glucose, Bld 90 65 - 99 mg/dL   BUN 31 (H) 7 - 25 mg/dL   Creat 1.48 (H) 0.60 - 0.95 mg/dL   eGFR 33 (L) > OR = 60 mL/min/1.10m   BUN/Creatinine Ratio 21 6 - 22 (calc)   Sodium 138  135 - 146 mmol/L   Potassium 4.7 3.5 - 5.3 mmol/L   Chloride 102 98 - 110 mmol/L   CO2 27 20 - 32 mmol/L   Calcium 9.2 8.6 - 10.4 mg/dL   Total Protein 7.5 6.1 - 8.1 g/dL   Albumin 3.8 3.6 - 5.1 g/dL   Globulin 3.7 1.9 - 3.7 g/dL (calc)   AG Ratio 1.0 1.0 - 2.5 (calc)   Total Bilirubin 0.4 0.2 - 1.2 mg/dL   Alkaline phosphatase (APISO) 47 37 - 153 U/L   AST 22 10 - 35 U/L   ALT 11 6 - 29 U/L  CBC with Differential/Platelet  Result Value Ref Range   WBC 4.9 3.8 - 10.8 Thousand/uL   RBC 3.99 3.80 - 5.10 Million/uL   Hemoglobin 11.0 (L) 11.7 - 15.5 g/dL   HCT 34.1 (L) 35.0 - 45.0 %   MCV 85.5 80.0 - 100.0 fL   MCH 27.6 27.0 - 33.0 pg   MCHC 32.3 32.0 - 36.0 g/dL   RDW 14.4 11.0 - 15.0 %   Platelets 313 140 - 400 Thousand/uL   MPV 10.4 7.5 - 12.5 fL   Neutro Abs 1,994 1,500 - 7,800 cells/uL   Lymphs Abs 2,328 850 - 3,900 cells/uL   Absolute Monocytes 397 200 - 950 cells/uL   Eosinophils Absolute 152 15 - 500 cells/uL   Basophils Absolute 29 0 - 200 cells/uL   Neutrophils Relative % 40.7 %   Total Lymphocyte 47.5 %   Monocytes Relative 8.1 %   Eosinophils Relative 3.1 %   Basophils Relative 0.6 %  CK  Result Value Ref Range   Total CK 90 29 - 143 U/L      Assessment & Plan:   Problem List Items Addressed This Visit     Essential hypertension   Relevant Medications   atorvastatin (LIPITOR) 40 MG tablet   lisinopril-hydrochlorothiazide (ZESTORETIC) 20-12.5 MG tablet   Hemiparesis affecting dominant side as late effect of cerebrovascular accident (CVA) (HCC)   Relevant Medications   clopidogrel (PLAVIX) 75 MG tablet   History of CVA with residual deficit - Primary   Relevant Medications   clopidogrel (PLAVIX) 75 MG tablet   Hyperlipidemia associated with type 2 diabetes mellitus (HCC)   Relevant Medications  atorvastatin (LIPITOR) 40 MG tablet   lisinopril-hydrochlorothiazide (ZESTORETIC) 20-12.5 MG tablet    Recent visit 2 weeks ago Here in follow-up  Reviewed  last Neurology consultation note. Question their recommendation on Plavix 3 x week, they did also document to take daily as per hospital discharge. Patient has continued daily Plavix.  Instructed her to continue ASA + Plavix daily for secondary stroke risk reduction prevention She will need to schedule w/ Neuro in 6 months  She missed her diagnostic mammogram apt yesterday Gave her and her daughter info on re-scheduling.  Call the [facility] to re-schedule appointment. It was scheduled for Tuesday 02/23/22. But they will need to re-schedule.  [place] at [place], [place] [place], [place] [address] Phone: [phone]  ---------------------------------------------------  HYPERTENSION Controlled on current regimen.  Refilled all 3 medications - Atorvastatin, Plavix (DAILY), Lisinopril-HCTZ - 90 days with refills.  Completed Handicap placard good for 5 years. Returned to patient today.   Meds ordered this encounter  Medications   clopidogrel (PLAVIX) 75 MG tablet    Sig: Take 1 tablet (75 mg total) by mouth daily.    Dispense:  90 tablet    Refill:  3   atorvastatin (LIPITOR) 40 MG tablet    Sig: Take 1 tablet (40 mg total) by mouth daily.    Dispense:  90 tablet    Refill:  3   lisinopril-hydrochlorothiazide (ZESTORETIC) 20-12.5 MG tablet    Sig: Take 1 tablet by mouth daily.    Dispense:  90 tablet    Refill:  3      Follow up plan: Return if symptoms worsen or fail to improve, for keep apt in April 2024.   [name], [facility] 02/24/2022, 11:35 AM

## 2022-02-24 NOTE — Patient Instructions (Addendum)
Thank you for coming to the office today.  Call the [facility] to re-schedule appointment. It was scheduled for Tuesday 02/23/22. But they will need to re-schedule.  [place] at [facility], [address] [place], [place] [address] Phone: [phone]  ---------------------------------------------------  Refilled all 3 medications  - Atorvastatin, Plavix (DAILY), Lisinopril-HCTZ - 90 days with refills.  Handicap placard good for 5 years.  Keep up with the Neurologist in 6 months. You may need to call to schedule next visit.  Vickki Hearing, MD  [place]  [facility]  [place], [place] [address]  [phone] (Work)    [place], [name], [place], [place] [address]  [phone] (Work)    Please schedule a Follow-up Appointment to: Return if symptoms worsen or fail to improve, for keep apt in April 2024.  If you have any other questions or concerns, please feel free to call the office or send a message through [place]. You may also schedule an earlier appointment if necessary.  Additionally, you may be receiving a survey about your experience at our office within a few days to 1 week by e-mail or mail. We value your feedback.  [name], DO [place]

## 2022-02-25 ENCOUNTER — Telehealth: Payer: Self-pay | Admitting: Family Medicine

## 2022-02-25 ENCOUNTER — Other Ambulatory Visit: Payer: Self-pay | Admitting: Family Medicine

## 2022-02-25 DIAGNOSIS — E785 Hyperlipidemia, unspecified: Secondary | ICD-10-CM

## 2022-02-25 NOTE — Telephone Encounter (Signed)
Okay to proceed with verbal orders  [name], [facility] 02/25/2022, 3:25 PM

## 2022-02-25 NOTE — Telephone Encounter (Signed)
Home Health Verbal Orders - Caller/Agency: Tiffany with [place] Number: [phone] option 2  Requesting OT/PT/Skilled Nursing/Social Work/Speech Therapy: For PT   Frequency:  1 w 1,  2 w 4 and 1 w 4

## 2022-02-25 NOTE — Telephone Encounter (Signed)
Requested medication (s) are due for refill today: see below  Requested medication (s) are on the active medication list: no  Last refill:  02/16/21  Future visit scheduled: yes  Notes to clinic:  rx was discontinued on 02/01/2022 by Emeterio Reeve, DO for the following reason: Stop Taking at Discharge. please advise if appropriate for refill      Requested Prescriptions  Pending Prescriptions Disp Refills   pravastatin (PRAVACHOL) 20 MG tablet [Pharmacy Med Name: PRAVASTATIN '20MG'$  TABLETS] 90 tablet 3    Sig: TAKE 1 TABLET(20 MG) BY MOUTH AT BEDTIME     Cardiovascular:  Antilipid - Statins Failed - 02/25/2022  6:23 AM      Failed - Lipid Panel in normal range within the last 12 months    Cholesterol  Date Value Ref Range Status  01/31/2022 178 0 - 200 mg/dL Final   LDL Cholesterol (Calc)  Date Value Ref Range Status  03/24/2021 124 (H) mg/dL (calc) Final    Comment:    Reference range: <100 . Desirable range <100 mg/dL for primary prevention;   <70 mg/dL for patients with CHD or diabetic patients  with > or = 2 CHD risk factors. Marland Kitchen LDL-C is now calculated using the Martin-Hopkins  calculation, which is a validated novel method providing  better accuracy than the Friedewald equation in the  estimation of LDL-C.  Cresenciano Genre et al. Annamaria Helling. 0973;532(99): 2061-2068  (http://education.QuestDiagnostics.com/faq/FAQ164)    LDL Cholesterol  Date Value Ref Range Status  01/31/2022 113 (H) 0 - 99 mg/dL Final    Comment:           Total Cholesterol/HDL:CHD Risk Coronary Heart Disease Risk Table                     Men   Women  1/2 Average Risk   3.4   3.3  Average Risk       5.0   4.4  2 X Average Risk   9.6   7.1  3 X Average Risk  23.4   11.0        Use the calculated Patient Ratio above and the CHD Risk Table to determine the patient's CHD Risk.        ATP III CLASSIFICATION (LDL):  <100     mg/dL   Optimal  100-129  mg/dL   Near or Above                    Optimal   130-159  mg/dL   Borderline  160-189  mg/dL   High  >190     mg/dL   Very High Performed at [facility], [place], [place] [address]    HDL  Date Value Ref Range Status  01/31/2022 56 >40 mg/dL Final   Triglycerides  Date Value Ref Range Status  01/31/2022 45 <150 mg/dL Final         Passed - Patient is not pregnant      Passed - Valid encounter within last 12 months    Recent Outpatient Visits           Yesterday History of CVA with residual deficit   [place], DO   1 week ago History of CVA with residual deficit   [place], DO   7 months ago Type 2 diabetes mellitus with diabetic nephropathy, without long-term current use of  insulin [facility])   [facility] [place], Devonne Doughty, DO   9 months ago Mass of upper outer quadrant of right breast   [place], DO   11 months ago Type 2 diabetes mellitus with diabetic nephropathy, without long-term current use of insulin [facility])   [facility] [place], Devonne Doughty, DO       Future Appointments             In 2 months [place], Devonne Doughty, DO [facility], [facility]

## 2022-03-01 ENCOUNTER — Telehealth: Payer: Medicare Other

## 2022-03-01 ENCOUNTER — Ambulatory Visit: Payer: Self-pay

## 2022-03-01 DIAGNOSIS — I6930 Unspecified sequelae of cerebral infarction: Secondary | ICD-10-CM

## 2022-03-01 DIAGNOSIS — I10 Essential (primary) hypertension: Secondary | ICD-10-CM

## 2022-03-01 NOTE — Telephone Encounter (Signed)
Message left on Tiffany's machine.

## 2022-03-01 NOTE — Chronic Care Management (AMB) (Signed)
Chronic Care Management   CCM RN Visit Note  03/01/2022 Name: Ellen [name] MRN: [id number] DOB: Jun 02, 1929  Subjective: Ellen [name] is a 89 y.o. year old female who is a primary care patient of [name], DO. The patient was referred to the Chronic Care Management team for assistance with care management needs subsequent to provider initiation of CCM services and plan of care.    Today's Visit:   spoke to the patients daughter [name]  for follow up visit.        Goals Addressed             This Visit's Progress    CCM Expected Outcome:  Monitor, Self-Manage and Reduce Symptoms of: History of Stroke       Current Barriers:  Knowledge Deficits related to the importance of seeking early help for sx and sx of heart attack and stroke. The patient was experiencing numbness in her hands and legs. She went to the hospital by EMS and was admitted from 01-30-2022 to 02-01-2022 for R Thalamic infarct Care Coordination needs related to resources in the home for home health in a patient with post CVA/TIA and post discharge from the hospital on 02-01-2022 Chronic Disease Management support and education needs related to effective management of post CVA/TIA and other chronic conditions  Non-adherence to scheduled provider appointments  Planned Interventions: Evaluation of current treatment plan related to CVA/TIA and post discharge needs related to CVA/TIA and patient's adherence to plan as established by provider. Per the patients daughter the patient is doing very well. She says for her mother to be 71 it is like she has not had a stroke at all. She is working with PT/OT in the home. She continues to be independent and stay by herself. Denies any acute findings related to recent CVA. Advised patient to keep appointments with pcp, write down questions and concerns to ask the pcp, and review of available resources to the patient. Spoke to the patients daughter, [name] who is also  DRP. Provided education to patient re: the goals of the CCM team, available disciplines and resources, and help with needed support for the patient in the home setting post hospitalization. The patient is very independent. The patient has family members that have been checking on her daily. She continues to live alone. [name] states the patient is doing well and has not new concerns. Review of how to get in touch with the [facility] for any new needs between outreaches.  Reviewed medications with patient and discussed compliance. States compliance with medications. The patient is using a medication pill box for her medications. She saw the pcp last week for reconciliation of medications and her daughter went with her to assist and ask any new concerns  Provided patient with CVA educational materials related to warning sx and sx of new onset of CVA Reviewed scheduled/upcoming provider appointments including 05-14-2022 at 102 am with pcp Discussed plans with patient for ongoing care management follow up and provided patient with direct contact information for care management team Advised patient to discuss questions, concerns, needs of the patient, and ongoing support for effective management of chronic conditions with provider Screening for signs and symptoms of depression related to chronic disease state  Assessed social determinant of health barriers The patient continues to work with home health in her home. Denies any new changes or needs.  Review of safety in the home. The patient has a life alert system in place. The patient did call her  daughter when she felt numbness in her hand, arm, and leg. Will continue to monitor for changes, needs, or concerns. The patients daughter has the [facility] number to call for assistance or new needs.   Symptom Management: Take medications as prescribed   Attend all scheduled provider appointments Call provider office for new concerns or questions  call the Suicide and  Crisis Lifeline: 988 call the [place] National Suicide Prevention Lifeline: 8075010679 or TTY: 410-336-8402 TTY (732)152-0103) to talk to a trained counselor call 1-800-273-TALK (toll free, 24 hour hotline) if experiencing a Mental Health or [place]   Follow Up Plan: Telephone follow up appointment with care management team member scheduled for: 04-19-2022 at 74 am       CCM Expected Outcome:  Monitor, Self-Manage, and Reduce Symptoms of Hypertension       Current Barriers:  Knowledge Deficits related to to the benefits of checking blood pressures and monitoring for changes in HTN and heart health  Care Coordination needs related to support and education post hospitalization  in a patient with HTN and other chronic conditions Chronic Disease Management support and education needs related to effective management of HTN BP Readings from Last 3 Encounters:  02/24/22 130/84  02/12/22 120/66  02/01/22 (!) 159/61     Planned Interventions: Evaluation of current treatment plan related to hypertension self management and patient's adherence to plan as established by provider. The patient post hospital stay for TIA. Has gone back on her blood pressure medication. The patients blood pressures have stabilized out. The patient saw the pcp last week and the patient is doing well post hospitalization. She continue to be independent and lives alone. Education provided to the patients daughter who has the contact information for the [facility]. Provided education to patient re: stroke prevention, s/s of heart attack and stroke. Review and education, patient recent hospital stay for TIA 01-30-2022 to 02-01-2022. The patient is doing well and working with PT/OT; Reviewed prescribed diet heart healthy/ADA diet. Education and support given Reviewed medications with patient and discussed importance of compliance. Endorses compliance with medications. The patient saw the pcp last week for reconciliation  of medications and made sure she is on the right medications. Her daughter went with her and the daughter states they have all of her medications correct now and she is doing very well at home.   Discussed plans with patient for ongoing care management follow up and provided patient with direct contact information for care management team; Advised patient, providing education and rationale, to monitor blood pressure daily and record, calling PCP for findings outside established parameters;  Reviewed scheduled/upcoming provider appointments including: 05-14-2022 at 1020 am Saw the pcp last week on 02-24-2022 for medication reconciliation  Advised patient to discuss changes in blood pressures or heart health with provider; Provided education on prescribed diet heart healthy/ADA diet ;  Discussed complications of poorly controlled blood pressure such as heart disease, stroke, circulatory complications, vision complications, kidney impairment, sexual dysfunction;  Screening for signs and symptoms of depression related to chronic disease state;  Assessed social determinant of health barriers;   Symptom Management: Take medications as prescribed   Attend all scheduled provider appointments Call provider office for new concerns or questions  call the Suicide and Crisis Lifeline: 988 call the [place] National Suicide Prevention Lifeline: 936-209-7492 or TTY: 860 682 9710 TTY [phone]) to talk to a trained counselor call 1-800-273-TALK (toll free, 24 hour hotline) if experiencing a Mental Health or [place]  check blood  pressure 3 times per week write blood pressure results in a log or diary learn about high blood pressure keep a blood pressure log take blood pressure log to all doctor appointments call doctor for signs and symptoms of high blood pressure develop an action plan for high blood pressure keep all doctor appointments take medications for blood pressure exactly as  prescribed report new symptoms to your doctor  Follow Up Plan: Telephone follow up appointment with care management team member scheduled for: 04-19-2022 at 1030 am          Plan:Telephone follow up appointment with care management team member scheduled for:  04-19-2022 at 74 am  [place], MSN, CCM RN Care Manager  Chronic Care Management Direct Number: [phone]

## 2022-03-01 NOTE — Patient Instructions (Signed)
Please call the care guide team at [phone] if you need to cancel or reschedule your appointment.   If you are experiencing a Mental Health or [place] or need someone to talk to, please call the Suicide and Crisis Lifeline: 988 call the [place] National Suicide Prevention Lifeline: (818)614-8618 or TTY: 915 209 7900 TTY 361-800-6724) to talk to a trained counselor call 1-800-273-TALK (toll free, 24 hour hotline)   Following is a copy of the CCM Program Consent:  CCM service includes personalized support from designated clinical staff supervised by the physician, including individualized plan of care and coordination with other care providers 24/7 contact phone numbers for assistance for urgent and routine care needs. Service will only be billed when office clinical staff spend 20 minutes or more in a month to coordinate care. Only one practitioner may furnish and bill the service in a calendar month. The patient may stop CCM services at amy time (effective at the end of the month) by phone call to the office staff. The patient will be responsible for cost sharing (co-pay) or up to 20% of the service fee (after annual deductible is met)  Following is a copy of your full provider care plan:   Goals Addressed             This Visit's Progress    CCM Expected Outcome:  Monitor, Self-Manage and Reduce Symptoms of: History of Stroke       Current Barriers:  Knowledge Deficits related to the importance of seeking early help for sx and sx of heart attack and stroke. The patient was experiencing numbness in her hands and legs. She went to the hospital by EMS and was admitted from 01-30-2022 to 02-01-2022 for R Thalamic infarct Care Coordination needs related to resources in the home for home health in a patient with post CVA/TIA and post discharge from the hospital on 02-01-2022 Chronic Disease Management support and education needs related to effective management of post CVA/TIA and  other chronic conditions  Non-adherence to scheduled provider appointments  Planned Interventions: Evaluation of current treatment plan related to CVA/TIA and post discharge needs related to CVA/TIA and patient's adherence to plan as established by provider. Per the patients daughter the patient is doing very well. She says for her mother to be 33 it is like she has not had a stroke at all. She is working with PT/OT in the home. She continues to be independent and stay by herself. Denies any acute findings related to recent CVA. Advised patient to keep appointments with pcp, write down questions and concerns to ask the pcp, and review of available resources to the patient. Spoke to the patients daughter, [name] who is also DRP. Provided education to patient re: the goals of the CCM team, available disciplines and resources, and help with needed support for the patient in the home setting post hospitalization. The patient is very independent. The patient has family members that have been checking on her daily. She continues to live alone. [name] states the patient is doing well and has not new concerns. Review of how to get in touch with the [facility] for any new needs between outreaches.  Reviewed medications with patient and discussed compliance. States compliance with medications. The patient is using a medication pill box for her medications. She saw the pcp last week for reconciliation of medications and her daughter went with her to assist and ask any new concerns  Provided patient with CVA educational materials related to warning sx  and sx of new onset of CVA Reviewed scheduled/upcoming provider appointments including 05-14-2022 at 24 am with pcp Discussed plans with patient for ongoing care management follow up and provided patient with direct contact information for care management team Advised patient to discuss questions, concerns, needs of the patient, and ongoing support for effective management of  chronic conditions with provider Screening for signs and symptoms of depression related to chronic disease state  Assessed social determinant of health barriers The patient continues to work with home health in her home. Denies any new changes or needs.  Review of safety in the home. The patient has a life alert system in place. The patient did call her daughter when she felt numbness in her hand, arm, and leg. Will continue to monitor for changes, needs, or concerns. The patients daughter has the [facility] number to call for assistance or new needs.   Symptom Management: Take medications as prescribed   Attend all scheduled provider appointments Call provider office for new concerns or questions  call the Suicide and Crisis Lifeline: 988 call the [place] National Suicide Prevention Lifeline: 938-615-0606 or TTY: 236-176-0477 TTY [phone]) to talk to a trained counselor call 1-800-273-TALK (toll free, 24 hour hotline) if experiencing a Mental Health or [place]   Follow Up Plan: Telephone follow up appointment with care management team member scheduled for: 04-19-2022 at 81 am       CCM Expected Outcome:  Monitor, Self-Manage, and Reduce Symptoms of Hypertension       Current Barriers:  Knowledge Deficits related to to the benefits of checking blood pressures and monitoring for changes in HTN and heart health  Care Coordination needs related to support and education post hospitalization  in a patient with HTN and other chronic conditions Chronic Disease Management support and education needs related to effective management of HTN BP Readings from Last 3 Encounters:  02/24/22 130/84  02/12/22 120/66  02/01/22 (!) 159/61     Planned Interventions: Evaluation of current treatment plan related to hypertension self management and patient's adherence to plan as established by provider. The patient post hospital stay for TIA. Has gone back on her blood pressure medication. The  patients blood pressures have stabilized out. The patient saw the pcp last week and the patient is doing well post hospitalization. She continue to be independent and lives alone. Education provided to the patients daughter who has the contact information for the [facility]. Provided education to patient re: stroke prevention, s/s of heart attack and stroke. Review and education, patient recent hospital stay for TIA 01-30-2022 to 02-01-2022. The patient is doing well and working with PT/OT; Reviewed prescribed diet heart healthy/ADA diet. Education and support given Reviewed medications with patient and discussed importance of compliance. Endorses compliance with medications. The patient saw the pcp last week for reconciliation of medications and made sure she is on the right medications. Her daughter went with her and the daughter states they have all of her medications correct now and she is doing very well at home.   Discussed plans with patient for ongoing care management follow up and provided patient with direct contact information for care management team; Advised patient, providing education and rationale, to monitor blood pressure daily and record, calling PCP for findings outside established parameters;  Reviewed scheduled/upcoming provider appointments including: 05-14-2022 at 1020 am Saw the pcp last week on 02-24-2022 for medication reconciliation  Advised patient to discuss changes in blood pressures or heart health with provider; Provided education  on prescribed diet heart healthy/ADA diet ;  Discussed complications of poorly controlled blood pressure such as heart disease, stroke, circulatory complications, vision complications, kidney impairment, sexual dysfunction;  Screening for signs and symptoms of depression related to chronic disease state;  Assessed social determinant of health barriers;   Symptom Management: Take medications as prescribed   Attend all scheduled provider  appointments Call provider office for new concerns or questions  call the Suicide and Crisis Lifeline: 988 call the [place] National Suicide Prevention Lifeline: (708)838-2395 or TTY: 9543764744 TTY (289) 564-0168) to talk to a trained counselor call 1-800-273-TALK (toll free, 24 hour hotline) if experiencing a Mental Health or [place]  check blood pressure 3 times per week write blood pressure results in a log or diary learn about high blood pressure keep a blood pressure log take blood pressure log to all doctor appointments call doctor for signs and symptoms of high blood pressure develop an action plan for high blood pressure keep all doctor appointments take medications for blood pressure exactly as prescribed report new symptoms to your doctor  Follow Up Plan: Telephone follow up appointment with care management team member scheduled for: 04-19-2022 at 1030 am          Patient verbalizes understanding of instructions and care plan provided today and agrees to view in [place]. Active MyChart status and patient understanding of how to access instructions and care plan via MyChart confirmed with patient.     Telephone follow up appointment with care management team member scheduled for: 04-19-2022 at 1 am

## 2022-03-03 DIAGNOSIS — I10 Essential (primary) hypertension: Secondary | ICD-10-CM

## 2022-03-10 ENCOUNTER — Telehealth: Payer: Self-pay | Admitting: Pharmacist

## 2022-03-10 NOTE — Progress Notes (Signed)
   Outreach Note  03/10/2022 Name: Ellen [name] MRN: [id number] DOB: [birth year]  Referred by: [name], DO Reason for referral : No chief complaint on file.   Note CCM Pharmacist previously engaged with patient, but stopped following after multiple unsuccessful outreach attempts   Outreach to patient today as referred for assistance with medication adherence  Was unable to reach patient via telephone today and unable to leave a message as patient's voicemail is full.  Reach patient's daughter, Ellen [name], by telephone today. Daughter reports patient is doing well and taking her medications consistently. Denies any medication adherence concerns at this time as patient taking medications consistently from weekly pillbox and members of her church are coming to patient's home on a weekly basis to support her with filling of weekly pillbox. Denies medication related needs at this time.   Follow Up Plan:  Will follow up with PCP and CCM RNCM to provide this update. Clinical Pharmacist available to outreach to patient again in the future if needed.  Wallace Cullens, PharmD, [place] [phone]

## 2022-03-23 ENCOUNTER — Ambulatory Visit (INDEPENDENT_AMBULATORY_CARE_PROVIDER_SITE_OTHER): Payer: Medicare Other

## 2022-03-23 ENCOUNTER — Telehealth: Payer: Medicare Other

## 2022-03-23 DIAGNOSIS — I6930 Unspecified sequelae of cerebral infarction: Secondary | ICD-10-CM

## 2022-03-23 DIAGNOSIS — I10 Essential (primary) hypertension: Secondary | ICD-10-CM

## 2022-03-23 NOTE — Chronic Care Management (AMB) (Signed)
Chronic Care Management   CCM RN Visit Note  03/23/2022 Name: Ellen [name] MRN: [id number] DOB: 1929/07/16  Subjective: Ellen [name] is a 89 y.o. year old female who is a primary care patient of [name], DO. The patient was referred to the Chronic Care Management team for assistance with care management needs subsequent to provider initiation of CCM services and plan of care.    Today's Visit:   Spoke to the patient daughter Ellen [name] after an incoming call asking for assistance   for  help with patient sx and sx and events over the last several days causing concern with the patient and the patients daughter .        Goals Addressed             This Visit's Progress    CCM Expected Outcome:  Monitor, Self-Manage and Reduce Symptoms of: History of Stroke       Current Barriers:  Knowledge Deficits related to the importance of seeking early help for sx and sx of heart attack and stroke. The patient was experiencing numbness in her hands and legs. She went to the hospital by EMS and was admitted from 01-30-2022 to 02-01-2022 for R Thalamic infarct Care Coordination needs related to resources in the home for home health in a patient with post CVA/TIA and post discharge from the hospital on 02-01-2022 Chronic Disease Management support and education needs related to effective management of post CVA/TIA and other chronic conditions  Non-adherence to scheduled provider appointments  Planned Interventions: Evaluation of current treatment plan related to CVA/TIA and post discharge needs related to CVA/TIA and patient's adherence to plan as established by provider. Per the patients daughter the patient is doing very well. She says for her mother to be 82 it is like she has not had a stroke at all. She is working with PT/OT in the home. She continues to be independent and stay by herself. Denies any acute findings related to recent CVA. Advised patient to keep appointments with pcp,  write down questions and concerns to ask the pcp, and review of available resources to the patient. Spoke to the patients daughter, Ellen [name] who is also DRP. Provided education to patient re: the goals of the CCM team, available disciplines and resources, and help with needed support for the patient in the home setting post hospitalization. The patient is very independent. The patient has family members that have been checking on her daily. She continues to live alone. [name] states the patient is doing well and has not new concerns. Review of how to get in touch with the [facility] for any new needs between outreaches.  Reviewed medications with patient and discussed compliance. States compliance with medications. The patient is using a medication pill box for her medications. She saw the pcp last week for reconciliation of medications and her daughter went with her to assist and ask any new concerns  Provided patient with CVA educational materials related to warning sx and sx of new onset of CVA Reviewed scheduled/upcoming provider appointments including 05-14-2022 at 25 am with pcp Discussed plans with patient for ongoing care management follow up and provided patient with direct contact information for care management team Advised patient to discuss questions, concerns, needs of the patient, and ongoing support for effective management of chronic conditions with provider Screening for signs and symptoms of depression related to chronic disease state  Assessed social determinant of health barriers The patient continues to work with home health in  her home. Denies any new changes or needs.  Review of safety in the home. The patient has a life alert system in place. The patient did call her daughter when she felt numbness in her hand, arm, and leg. Will continue to monitor for changes, needs, or concerns. The patients daughter has the [facility] number to call for assistance or new needs. Review of safety due to the  patient with light headedness and dizziness.  Incoming call from the patients daughter asking for assistance with medication questions and concerns of sx and sx the patient is exhibiting. The patient called her daughter and stated that her stool was "black as coal" and this has been going on for 2 weeks. The patient was getting ready to call 911 but the daughter ask her to let her reach out to the [facility]. The patient also had black diarrhea and took a whole bottle of "pepto bismol" on Sunday. Direct messaging with the pcp on recommendations. The patients daughter states in January she had started on Plavix. Reviewed with the pcp the patients sx and sx of light headedness and dizziness and her stools being black. Also advised the pcp that the patient had taken a whole bottle of pepto bismol on Sunday. The pcp advised that the patient go to the urgent care for a CBC to check her blood levels. Advised the patients daughter that the patient could go to the urgent care at: [address], [place], [place] [address]. Also provided the number of [phone] for the patients daughter. Education and support. Will continue to monitor and assist as needed.   Symptom Management: Take medications as prescribed   Attend all scheduled provider appointments Call provider office for new concerns or questions  call the Suicide and Crisis Lifeline: 988 call the [place] National Suicide Prevention Lifeline: 640-691-9573 or TTY: 234-870-2427 TTY (607)366-0032) to talk to a trained counselor call 1-800-273-TALK (toll free, 24 hour hotline) if experiencing a Mental Health or [place]   Follow Up Plan: Telephone follow up appointment with care management team member scheduled for: 04-19-2022 at 54 am       CCM Expected Outcome:  Monitor, Self-Manage, and Reduce Symptoms of Hypertension       Current Barriers:  Knowledge Deficits related to to the benefits of checking blood pressures and monitoring for  changes in HTN and heart health  Care Coordination needs related to support and education post hospitalization  in a patient with HTN and other chronic conditions Chronic Disease Management support and education needs related to effective management of HTN BP Readings from Last 3 Encounters:  02/24/22 130/84  02/12/22 120/66  02/01/22 (!) 159/61     Planned Interventions: Evaluation of current treatment plan related to hypertension self management and patient's adherence to plan as established by provider.  Provided education to patient re: stroke prevention, s/s of heart attack and stroke.  Reviewed prescribed diet heart healthy/ADA diet. Education and support given Reviewed medications with patient and discussed importance of compliance. Endorses compliance with medications. The patient saw the pcp last week for reconciliation of medications and made sure she is on the right medications. Her daughter went with her and the daughter states they have all of her medications correct now and she is doing very well at home.   Discussed plans with patient for ongoing care management follow up and provided patient with direct contact information for care management team; Advised patient, providing education and rationale, to monitor blood pressure daily  and record, calling PCP for findings outside established parameters. Per the patients daughter she has been having light headed and dizziness especially the last week due to having stools "black as coal". Education provided to the patients daughter about changing position slowly and being evaluated at the urgent care per recommendations by the pcp after secure chat messaging. ;  Reviewed scheduled/upcoming provider appointments including: 05-14-2022 at 1020 am Saw the pcp last week on 02-24-2022 for medication reconciliation  Advised patient to discuss changes in blood pressures or heart health with provider; Provided education on prescribed diet heart  healthy/ADA diet ;  Discussed complications of poorly controlled blood pressure such as heart disease, stroke, circulatory complications, vision complications, kidney impairment, sexual dysfunction;  Screening for signs and symptoms of depression related to chronic disease state;  Assessed social determinant of health barriers;   Symptom Management: Take medications as prescribed   Attend all scheduled provider appointments Call provider office for new concerns or questions  call the Suicide and Crisis Lifeline: 988 call the [place] National Suicide Prevention Lifeline: (505)416-2005 or TTY: 3658859854 TTY 551-142-0346) to talk to a trained counselor call 1-800-273-TALK (toll free, 24 hour hotline) if experiencing a Mental Health or [place]  check blood pressure 3 times per week write blood pressure results in a log or diary learn about high blood pressure keep a blood pressure log take blood pressure log to all doctor appointments call doctor for signs and symptoms of high blood pressure develop an action plan for high blood pressure keep all doctor appointments take medications for blood pressure exactly as prescribed report new symptoms to your doctor  Follow Up Plan: Telephone follow up appointment with care management team member scheduled for: 04-19-2022 at 1030 am          Plan:Telephone follow up appointment with care management team member scheduled for:  04-19-2022 at 10 am  Noreene Larsson RN, MSN, CCM RN Care Manager  Chronic Care Management Direct Number: [phone]

## 2022-03-23 NOTE — Patient Instructions (Signed)
Please call the care guide team at [phone] if you need to cancel or reschedule your appointment.   If you are experiencing a Mental Health or [place] or need someone to talk to, please call the Suicide and Crisis Lifeline: 988 call the [place] National Suicide Prevention Lifeline: (818)614-8618 or TTY: 915 209 7900 TTY 361-800-6724) to talk to a trained counselor call 1-800-273-TALK (toll free, 24 hour hotline)   Following is a copy of the CCM Program Consent:  CCM service includes personalized support from designated clinical staff supervised by the physician, including individualized plan of care and coordination with other care providers 24/7 contact phone numbers for assistance for urgent and routine care needs. Service will only be billed when office clinical staff spend 20 minutes or more in a month to coordinate care. Only one practitioner may furnish and bill the service in a calendar month. The patient may stop CCM services at amy time (effective at the end of the month) by phone call to the office staff. The patient will be responsible for cost sharing (co-pay) or up to 20% of the service fee (after annual deductible is met)  Following is a copy of your full provider care plan:   Goals Addressed             This Visit's Progress    CCM Expected Outcome:  Monitor, Self-Manage and Reduce Symptoms of: History of Stroke       Current Barriers:  Knowledge Deficits related to the importance of seeking early help for sx and sx of heart attack and stroke. The patient was experiencing numbness in her hands and legs. She went to the hospital by EMS and was admitted from 01-30-2022 to 02-01-2022 for R Thalamic infarct Care Coordination needs related to resources in the home for home health in a patient with post CVA/TIA and post discharge from the hospital on 02-01-2022 Chronic Disease Management support and education needs related to effective management of post CVA/TIA and  other chronic conditions  Non-adherence to scheduled provider appointments  Planned Interventions: Evaluation of current treatment plan related to CVA/TIA and post discharge needs related to CVA/TIA and patient's adherence to plan as established by provider. Per the patients daughter the patient is doing very well. She says for her mother to be 33 it is like she has not had a stroke at all. She is working with PT/OT in the home. She continues to be independent and stay by herself. Denies any acute findings related to recent CVA. Advised patient to keep appointments with pcp, write down questions and concerns to ask the pcp, and review of available resources to the patient. Spoke to the patients daughter, [name] who is also DRP. Provided education to patient re: the goals of the CCM team, available disciplines and resources, and help with needed support for the patient in the home setting post hospitalization. The patient is very independent. The patient has family members that have been checking on her daily. She continues to live alone. [name] states the patient is doing well and has not new concerns. Review of how to get in touch with the [facility] for any new needs between outreaches.  Reviewed medications with patient and discussed compliance. States compliance with medications. The patient is using a medication pill box for her medications. She saw the pcp last week for reconciliation of medications and her daughter went with her to assist and ask any new concerns  Provided patient with CVA educational materials related to warning sx  and sx of new onset of CVA Reviewed scheduled/upcoming provider appointments including 05-14-2022 at 53 am with pcp Discussed plans with patient for ongoing care management follow up and provided patient with direct contact information for care management team Advised patient to discuss questions, concerns, needs of the patient, and ongoing support for effective management of  chronic conditions with provider Screening for signs and symptoms of depression related to chronic disease state  Assessed social determinant of health barriers The patient continues to work with home health in her home. Denies any new changes or needs.  Review of safety in the home. The patient has a life alert system in place. The patient did call her daughter when she felt numbness in her hand, arm, and leg. Will continue to monitor for changes, needs, or concerns. The patients daughter has the [facility] number to call for assistance or new needs. Review of safety due to the patient with light headedness and dizziness.  Incoming call from the patients daughter asking for assistance with medication questions and concerns of sx and sx the patient is exhibiting. The patient called her daughter and stated that her stool was "black as coal" and this has been going on for 2 weeks. The patient was getting ready to call 911 but the daughter ask her to let her reach out to the [facility]. The patient also had black diarrhea and took a whole bottle of "pepto bismol" on Sunday. Direct messaging with the pcp on recommendations. The patients daughter states in January she had started on Plavix. Reviewed with the pcp the patients sx and sx of light headedness and dizziness and her stools being black. Also advised the pcp that the patient had taken a whole bottle of pepto bismol on Sunday. The pcp advised that the patient go to the urgent care for a CBC to check her blood levels. Advised the patients daughter that the patient could go to the urgent care at: [address], [place], [place] [address]. Also provided the number of [phone] for the patients daughter. Education and support. Will continue to monitor and assist as needed.   Symptom Management: Take medications as prescribed   Attend all scheduled provider appointments Call provider office for new concerns or questions  call the Suicide and Crisis Lifeline:  988 call the [place] National Suicide Prevention Lifeline: 972-730-7194 or TTY: 570-181-9219 TTY 8252978160) to talk to a trained counselor call 1-800-273-TALK (toll free, 24 hour hotline) if experiencing a Mental Health or [place]   Follow Up Plan: Telephone follow up appointment with care management team member scheduled for: 04-19-2022 at 54 am       CCM Expected Outcome:  Monitor, Self-Manage, and Reduce Symptoms of Hypertension       Current Barriers:  Knowledge Deficits related to to the benefits of checking blood pressures and monitoring for changes in HTN and heart health  Care Coordination needs related to support and education post hospitalization  in a patient with HTN and other chronic conditions Chronic Disease Management support and education needs related to effective management of HTN BP Readings from Last 3 Encounters:  02/24/22 130/84  02/12/22 120/66  02/01/22 (!) 159/61     Planned Interventions: Evaluation of current treatment plan related to hypertension self management and patient's adherence to plan as established by provider.  Provided education to patient re: stroke prevention, s/s of heart attack and stroke.  Reviewed prescribed diet heart healthy/ADA diet. Education and support given Reviewed medications with patient  and discussed importance of compliance. Endorses compliance with medications. The patient saw the pcp last week for reconciliation of medications and made sure she is on the right medications. Her daughter went with her and the daughter states they have all of her medications correct now and she is doing very well at home.   Discussed plans with patient for ongoing care management follow up and provided patient with direct contact information for care management team; Advised patient, providing education and rationale, to monitor blood pressure daily and record, calling PCP for findings outside established parameters. Per the  patients daughter she has been having light headed and dizziness especially the last week due to having stools "black as coal". Education provided to the patients daughter about changing position slowly and being evaluated at the urgent care per recommendations by the pcp after secure chat messaging. ;  Reviewed scheduled/upcoming provider appointments including: 05-14-2022 at 1020 am Saw the pcp last week on 02-24-2022 for medication reconciliation  Advised patient to discuss changes in blood pressures or heart health with provider; Provided education on prescribed diet heart healthy/ADA diet ;  Discussed complications of poorly controlled blood pressure such as heart disease, stroke, circulatory complications, vision complications, kidney impairment, sexual dysfunction;  Screening for signs and symptoms of depression related to chronic disease state;  Assessed social determinant of health barriers;   Symptom Management: Take medications as prescribed   Attend all scheduled provider appointments Call provider office for new concerns or questions  call the Suicide and Crisis Lifeline: 988 call the [place] National Suicide Prevention Lifeline: (310)486-5980 or TTY: 419-024-5841 TTY (478)538-5279) to talk to a trained counselor call 1-800-273-TALK (toll free, 24 hour hotline) if experiencing a Mental Health or [place]  check blood pressure 3 times per week write blood pressure results in a log or diary learn about high blood pressure keep a blood pressure log take blood pressure log to all doctor appointments call doctor for signs and symptoms of high blood pressure develop an action plan for high blood pressure keep all doctor appointments take medications for blood pressure exactly as prescribed report new symptoms to your doctor  Follow Up Plan: Telephone follow up appointment with care management team member scheduled for: 04-19-2022 at 1030 am          Patient  verbalizes understanding of instructions and care plan provided today and agrees to view in [place]. Active MyChart status and patient understanding of how to access instructions and care plan via MyChart confirmed with patient.     Telephone follow up appointment with care management team member scheduled for: 04-19-2022 at 47 am

## 2022-04-01 DIAGNOSIS — I10 Essential (primary) hypertension: Secondary | ICD-10-CM

## 2022-04-19 ENCOUNTER — Telehealth: Payer: Medicare Other

## 2022-04-19 ENCOUNTER — Ambulatory Visit (INDEPENDENT_AMBULATORY_CARE_PROVIDER_SITE_OTHER): Payer: Medicare Other

## 2022-04-19 DIAGNOSIS — I10 Essential (primary) hypertension: Secondary | ICD-10-CM

## 2022-04-19 DIAGNOSIS — I6930 Unspecified sequelae of cerebral infarction: Secondary | ICD-10-CM

## 2022-04-19 NOTE — Chronic Care Management (AMB) (Signed)
Chronic Care Management   CCM RN Visit Note  04/19/2022 Name: Ellen [name] MRN: [id number] DOB: October 03, 1929  Subjective: Ellen [name] is a 89 y.o. year old female who is a primary care patient of [name], DO. The patient was referred to the Chronic Care Management team for assistance with care management needs subsequent to provider initiation of CCM services and plan of care.    Today's Visit:  Engaged with patient by telephone for follow up visit.        Goals Addressed             This Visit's Progress    CCM Expected Outcome:  Monitor, Self-Manage and Reduce Symptoms of: History of Stroke       Current Barriers:  Knowledge Deficits related to the importance of seeking early help for sx and sx of heart attack and stroke. The patient is doing well post stroke.  Care Coordination needs related to resources in the home for home health in a patient with post CVA/TIA and post discharge from the hospital on 02-01-2022 Chronic Disease Management support and education needs related to effective management of post CVA/TIA and other chronic conditions  Non-adherence to scheduled provider appointments  Planned Interventions: Evaluation of current treatment plan related to CVA/TIA and post discharge needs related to CVA/TIA and patient's adherence to plan as established by provider. Per the patients daughter the patient is doing very well. She says for her mother to be 89 it is like she has not had a stroke at all. She is very active and is wanting the weather to stay warmer so she can go fishing. No new concerns related to CVA/TIA. Does call the daughter when she has new concerns come about. Will continue to monitor for changes or new needs.  Advised patient to keep appointments with pcp, write down questions and concerns to ask the pcp, and review of available resources to the patient. Spoke to the patients daughter, Ellen [name] who is also DRP. Provided education to patient re:  the goals of the CCM team, available disciplines and resources, and help with needed support for the patient in the home setting post hospitalization. The patient is very independent. The patient has family members that have been checking on her daily. She continues to live alone. [name] states the patient is doing well and has not new concerns. Review of how to get in touch with the [facility] for any new needs between outreaches.  Reviewed medications with patient and discussed compliance. States compliance with medications. The patient is using a medication pill box for her medications. She saw the pcp last week for reconciliation of medications and her daughter went with her to assist and ask any new concerns  Provided patient with CVA educational materials related to warning sx and sx of new onset of CVA Reviewed scheduled/upcoming provider appointments including 05-18-2022 at 67 amam with pcp Discussed plans with patient for ongoing care management follow up and provided patient with direct contact information for care management team Advised patient to discuss questions, concerns, needs of the patient, and ongoing support for effective management of chronic conditions with provider Screening for signs and symptoms of depression related to chronic disease state  Assessed social determinant of health barriers The patient continues to work with home health in her home. Denies any new changes or needs.  Review of safety in the home. The patient has a life alert system in place. The patient did call her daughter when she felt  numbness in her hand, arm, and leg. Will continue to monitor for changes, needs, or concerns. The patients daughter has the [facility] number to call for assistance or new needs. Review of safety due to the patient with light headedness and dizziness.    Symptom Management: Take medications as prescribed   Attend all scheduled provider appointments Call provider office for new concerns or  questions  call the Suicide and Crisis Lifeline: 988 call the [place] National Suicide Prevention Lifeline: (765)661-4556 or TTY: (207) 574-8384 TTY 351-456-0962) to talk to a trained counselor call 1-800-273-TALK (toll free, 24 hour hotline) if experiencing a Mental Health or [place]   Follow Up Plan: Telephone follow up appointment with care management team member scheduled for: 06-14-2022 at 41 am       CCM Expected Outcome:  Monitor, Self-Manage, and Reduce Symptoms of Hypertension       Current Barriers:  Knowledge Deficits related to to the benefits of checking blood pressures and monitoring for changes in HTN and heart health  Care Coordination needs related to support and education post hospitalization  in a patient with HTN and other chronic conditions Chronic Disease Management support and education needs related to effective management of HTN BP Readings from Last 3 Encounters:  02/24/22 130/84  02/12/22 120/66  02/01/22 (!) 159/61     Planned Interventions: Evaluation of current treatment plan related to hypertension self management and patient's adherence to plan as established by provider. The patients blood pressures are stable. She is doing well over all. Feels she needs to find out why she is having pain in her left breast from where she had breast cancer 8 years ago. The patients daughter states that she may be thinking about it more because her other daughter is currently going through radiation treatments for breast cancer in her left breast. The patient has an appointment tomorrow to see the pcp for evaluation and recommendations. Call made back to the patients daughter and reviewed the pcps recommendations. Will continue to monitor.  Provided education to patient re: stroke prevention, s/s of heart attack and stroke. The patient has a history of stroke. Is doing well overall. Is driving and active. Wants to go fishing as this is something she loves to do.   Reviewed prescribed diet heart healthy/ADA diet. Education and support given Reviewed medications with patient and discussed importance of compliance. Endorses compliance with medications. The patient has her medications and is compliant with medications. Her daughter Ellen [name] assist with any new needs related to medications. Will continue to monitor.  Discussed plans with patient for ongoing care management follow up and provided patient with direct contact information for care management team; Advised patient, providing education and rationale, to monitor blood pressure daily and record, calling PCP for findings outside established parameters. Denies any new needs with her blood pressures.  Reviewed scheduled/upcoming provider appointments including: 05-18-2022 at 1040, the patient has an appointment to see the pcp tomorrow at 240 pm for shooting in her left breast. Secure chatting with the pcp and the pcp will see the patient for evaluation and recommendations tomorrow.  Advised patient to discuss changes in blood pressures or heart health with provider; Provided education on prescribed diet heart healthy/ADA diet ;  Discussed complications of poorly controlled blood pressure such as heart disease, stroke, circulatory complications, vision complications, kidney impairment, sexual dysfunction;  Screening for signs and symptoms of depression related to chronic disease state;  Assessed social determinant of health barriers;   Symptom Management:  Take medications as prescribed   Attend all scheduled provider appointments Call provider office for new concerns or questions  call the Suicide and Crisis Lifeline: 988 call the [place] National Suicide Prevention Lifeline: (506)814-0027 or TTY: (780) 699-2662 TTY 5634856183) to talk to a trained counselor call 1-800-273-TALK (toll free, 24 hour hotline) if experiencing a Mental Health or [place]  check blood pressure 3 times per week write  blood pressure results in a log or diary learn about high blood pressure keep a blood pressure log take blood pressure log to all doctor appointments call doctor for signs and symptoms of high blood pressure develop an action plan for high blood pressure keep all doctor appointments take medications for blood pressure exactly as prescribed report new symptoms to your doctor  Follow Up Plan: Telephone follow up appointment with care management team member scheduled for: 06-14-2022 at 1030 am          Plan:Telephone follow up appointment with care management team member scheduled for:  06-14-2022 at 66 am  Noreene Larsson RN, MSN, CCM RN Care Manager  Chronic Care Management Direct Number: [phone]

## 2022-04-19 NOTE — Patient Instructions (Signed)
Please call the care guide team at [phone] if you need to cancel or reschedule your appointment.   If you are experiencing a Mental Health or [place] or need someone to talk to, please call the Suicide and Crisis Lifeline: 988 call the [place] National Suicide Prevention Lifeline: 623-545-7944 or TTY: 3058304932 TTY 959-399-7677) to talk to a trained counselor call 1-800-273-TALK (toll free, 24 hour hotline)   Following is a copy of the CCM Program Consent:  CCM service includes personalized support from designated clinical staff supervised by the physician, including individualized plan of care and coordination with other care providers 24/7 contact phone numbers for assistance for urgent and routine care needs. Service will only be billed when office clinical staff spend 20 minutes or more in a month to coordinate care. Only one practitioner may furnish and bill the service in a calendar month. The patient may stop CCM services at amy time (effective at the end of the month) by phone call to the office staff. The patient will be responsible for cost sharing (co-pay) or up to 20% of the service fee (after annual deductible is met)  Following is a copy of your full provider care plan:   Goals Addressed             This Visit's Progress    CCM Expected Outcome:  Monitor, Self-Manage and Reduce Symptoms of: History of Stroke       Current Barriers:  Knowledge Deficits related to the importance of seeking early help for sx and sx of heart attack and stroke. The patient is doing well post stroke.  Care Coordination needs related to resources in the home for home health in a patient with post CVA/TIA and post discharge from the hospital on 02-01-2022 Chronic Disease Management support and education needs related to effective management of post CVA/TIA and other chronic conditions  Non-adherence to scheduled provider appointments  Planned Interventions: Evaluation of  current treatment plan related to CVA/TIA and post discharge needs related to CVA/TIA and patient's adherence to plan as established by provider. Per the patients daughter the patient is doing very well. She says for her mother to be 81 it is like she has not had a stroke at all. She is very active and is wanting the weather to stay warmer so she can go fishing. No new concerns related to CVA/TIA. Does call the daughter when she has new concerns come about. Will continue to monitor for changes or new needs.  Advised patient to keep appointments with pcp, write down questions and concerns to ask the pcp, and review of available resources to the patient. Spoke to the patients daughter, [name] who is also DRP. Provided education to patient re: the goals of the CCM team, available disciplines and resources, and help with needed support for the patient in the home setting post hospitalization. The patient is very independent. The patient has family members that have been checking on her daily. She continues to live alone. [name] states the patient is doing well and has not new concerns. Review of how to get in touch with the [facility] for any new needs between outreaches.  Reviewed medications with patient and discussed compliance. States compliance with medications. The patient is using a medication pill box for her medications. She saw the pcp last week for reconciliation of medications and her daughter went with her to assist and ask any new concerns  Provided patient with CVA educational materials related to warning sx and  sx of new onset of CVA Reviewed scheduled/upcoming provider appointments including 05-18-2022 at 80 amam with pcp Discussed plans with patient for ongoing care management follow up and provided patient with direct contact information for care management team Advised patient to discuss questions, concerns, needs of the patient, and ongoing support for effective management of chronic conditions with  provider Screening for signs and symptoms of depression related to chronic disease state  Assessed social determinant of health barriers The patient continues to work with home health in her home. Denies any new changes or needs.  Review of safety in the home. The patient has a life alert system in place. The patient did call her daughter when she felt numbness in her hand, arm, and leg. Will continue to monitor for changes, needs, or concerns. The patients daughter has the [facility] number to call for assistance or new needs. Review of safety due to the patient with light headedness and dizziness.    Symptom Management: Take medications as prescribed   Attend all scheduled provider appointments Call provider office for new concerns or questions  call the Suicide and Crisis Lifeline: 988 call the [place] National Suicide Prevention Lifeline: 7697185597 or TTY: 7625717764 TTY 404-191-1865) to talk to a trained counselor call 1-800-273-TALK (toll free, 24 hour hotline) if experiencing a Mental Health or [place]   Follow Up Plan: Telephone follow up appointment with care management team member scheduled for: 06-14-2022 at 56 am       CCM Expected Outcome:  Monitor, Self-Manage, and Reduce Symptoms of Hypertension       Current Barriers:  Knowledge Deficits related to to the benefits of checking blood pressures and monitoring for changes in HTN and heart health  Care Coordination needs related to support and education post hospitalization  in a patient with HTN and other chronic conditions Chronic Disease Management support and education needs related to effective management of HTN BP Readings from Last 3 Encounters:  02/24/22 130/84  02/12/22 120/66  02/01/22 (!) 159/61     Planned Interventions: Evaluation of current treatment plan related to hypertension self management and patient's adherence to plan as established by provider. The patients blood pressures are stable.  She is doing well over all. Feels she needs to find out why she is having pain in her left breast from where she had breast cancer 8 years ago. The patients daughter states that she may be thinking about it more because her other daughter is currently going through radiation treatments for breast cancer in her left breast. The patient has an appointment tomorrow to see the pcp for evaluation and recommendations. Call made back to the patients daughter and reviewed the pcps recommendations. Will continue to monitor.  Provided education to patient re: stroke prevention, s/s of heart attack and stroke. The patient has a history of stroke. Is doing well overall. Is driving and active. Wants to go fishing as this is something she loves to do.  Reviewed prescribed diet heart healthy/ADA diet. Education and support given Reviewed medications with patient and discussed importance of compliance. Endorses compliance with medications. The patient has her medications and is compliant with medications. Her daughter [name] assist with any new needs related to medications. Will continue to monitor.  Discussed plans with patient for ongoing care management follow up and provided patient with direct contact information for care management team; Advised patient, providing education and rationale, to monitor blood pressure daily and record, calling PCP for findings outside established parameters. Denies  any new needs with her blood pressures.  Reviewed scheduled/upcoming provider appointments including: 05-18-2022 at 1040, the patient has an appointment to see the pcp tomorrow at 240 pm for shooting in her left breast. Secure chatting with the pcp and the pcp will see the patient for evaluation and recommendations tomorrow.  Advised patient to discuss changes in blood pressures or heart health with provider; Provided education on prescribed diet heart healthy/ADA diet ;  Discussed complications of poorly controlled blood  pressure such as heart disease, stroke, circulatory complications, vision complications, kidney impairment, sexual dysfunction;  Screening for signs and symptoms of depression related to chronic disease state;  Assessed social determinant of health barriers;   Symptom Management: Take medications as prescribed   Attend all scheduled provider appointments Call provider office for new concerns or questions  call the Suicide and Crisis Lifeline: 988 call the [place] National Suicide Prevention Lifeline: (256)056-8228 or TTY: 402-201-9474 TTY 2493428718) to talk to a trained counselor call 1-800-273-TALK (toll free, 24 hour hotline) if experiencing a Mental Health or [place]  check blood pressure 3 times per week write blood pressure results in a log or diary learn about high blood pressure keep a blood pressure log take blood pressure log to all doctor appointments call doctor for signs and symptoms of high blood pressure develop an action plan for high blood pressure keep all doctor appointments take medications for blood pressure exactly as prescribed report new symptoms to your doctor  Follow Up Plan: Telephone follow up appointment with care management team member scheduled for: 06-14-2022 at 1030 am          Patient verbalizes understanding of instructions and care plan provided today and agrees to view in [place]. Active MyChart status and patient understanding of how to access instructions and care plan via MyChart confirmed with patient.     Telephone follow up appointment with care management team member scheduled for: 06-14-2022 at 108 am

## 2022-04-20 ENCOUNTER — Ambulatory Visit: Payer: Self-pay

## 2022-04-20 ENCOUNTER — Encounter: Payer: Self-pay | Admitting: Family Medicine

## 2022-04-20 ENCOUNTER — Ambulatory Visit (INDEPENDENT_AMBULATORY_CARE_PROVIDER_SITE_OTHER): Payer: Medicare Other | Admitting: Family Medicine

## 2022-04-20 VITALS — BP 132/68 | HR 73 | Ht 60.0 in | Wt 127.8 lb

## 2022-04-20 DIAGNOSIS — H539 Unspecified visual disturbance: Secondary | ICD-10-CM

## 2022-04-20 DIAGNOSIS — G8929 Other chronic pain: Secondary | ICD-10-CM

## 2022-04-20 DIAGNOSIS — I6930 Unspecified sequelae of cerebral infarction: Secondary | ICD-10-CM | POA: Diagnosis not present

## 2022-04-20 DIAGNOSIS — N644 Mastodynia: Secondary | ICD-10-CM | POA: Diagnosis not present

## 2022-04-20 DIAGNOSIS — I69359 Hemiplegia and hemiparesis following cerebral infarction affecting unspecified side: Secondary | ICD-10-CM

## 2022-04-20 DIAGNOSIS — R202 Paresthesia of skin: Secondary | ICD-10-CM

## 2022-04-20 DIAGNOSIS — N6321 Unspecified lump in the left breast, upper outer quadrant: Secondary | ICD-10-CM | POA: Diagnosis not present

## 2022-04-20 DIAGNOSIS — R630 Anorexia: Secondary | ICD-10-CM

## 2022-04-20 DIAGNOSIS — I10 Essential (primary) hypertension: Secondary | ICD-10-CM

## 2022-04-20 DIAGNOSIS — M25511 Pain in right shoulder: Secondary | ICD-10-CM

## 2022-04-20 MED ORDER — LISINOPRIL-HYDROCHLOROTHIAZIDE 20-12.5 MG PO TABS: 1.0000 | ORAL_TABLET | Freq: Every day | ORAL | 3 refills | Status: DC

## 2022-04-20 MED ORDER — MIRTAZAPINE 15 MG PO TABS: 15.0000 mg | ORAL_TABLET | Freq: Every day | ORAL | 2 refills | Status: DC

## 2022-04-20 NOTE — Patient Instructions (Addendum)
Thank you for coming to the office today.  For Mammogram screening for breast cancer   Call the [facility] below anytime to schedule your own appointment now that order has been placed.  [facility] [place], [address] [place], [place] [address] Phone: [phone]  -----------  [facility] (formerly [facility]) Address: [address], [place], [place] [address] Hours:  9AM-5PM Phone: [phone]  Also has urgent care hours for orthopedics   Please schedule a Follow-up Appointment to: Return if symptoms worsen or fail to improve.  If you have any other questions or concerns, please feel free to call the office or send a message through [place]. You may also schedule an earlier appointment if necessary.  Additionally, you may be receiving a survey about your experience at our office within a few days to 1 week by e-mail or mail. We value your feedback.  [name], DO [place]

## 2022-04-20 NOTE — Progress Notes (Incomplete)
Subjective:    Patient ID: Ellen [name], female    DOB: [birth year], 89 y.o.   MRN: [id number]  Ellen [name] is a 89 y.o. female presenting on 04/20/2022 for Breast Pain (Pt. States that she has had pain in left breast for 3 months. She says that it's more of a throbbing feeling.) and Weight Loss (She is worried over her weight loss since previous appt.)  Patient without family member here today  HPI  Vision changes Asking about referral to Eye Doctor  Left Breast Pain, nodules Recently discussed problem with L breast pain and nodularity No showed diagnostic mammogram 02/2022 Throbbing pain at times History of breast cancer, has not returned to  Missed last mammogram due to recovering from stroke 01/30/22  R shoulder injury with [facility] joint Fall injury R wrist had cast on it Also has numbness associated *** May need Orthopedic   ***Lost appetite, weight loss       02/08/2022    2:36 PM 07/23/2021   10:15 AM 02/16/2021    9:50 AM  Depression screen PHQ 2/9  Decreased Interest 0 0 0  Down, Depressed, Hopeless 0 1 0  PHQ - 2 Score 0 1 0  Altered sleeping   0  Tired, decreased energy   0  Change in appetite   0  Feeling bad or failure about yourself    0  Trouble concentrating   0  Moving slowly or fidgety/restless   0  Suicidal thoughts   0  PHQ-9 Score   0  Difficult doing work/chores   Not difficult at all    Social History   Tobacco Use  . Smoking status: Never  . Smokeless tobacco: Never  Vaping Use  . Vaping Use: Never used  Substance Use Topics  . Alcohol use: Never  . Drug use: Never    Review of Systems Per HPI unless specifically indicated above     Objective:    BP 132/68 (BP Location: Right Arm, Patient Position: Sitting)   Pulse 73   Ht 5' (1.524 m)   Wt 127 lb 12.8 oz (58 kg)   SpO2 99%   BMI 24.96 kg/m   Wt Readings from Last 3 Encounters:  04/20/22 127 lb 12.8 oz (58 kg)  02/24/22 130 lb 6.4 oz (59.1 kg)  02/12/22 132 lb 12.8 oz  (60.2 kg)    Physical Exam   Results for orders placed or performed in visit on 02/12/22  COMPLETE METABOLIC PANEL WITH GFR  Result Value Ref Range   Glucose, Bld 90 65 - 99 mg/dL   BUN 31 (H) 7 - 25 mg/dL   Creat 1.48 (H) 0.60 - 0.95 mg/dL   eGFR 33 (L) > OR = 60 mL/min/1.72m2   BUN/Creatinine Ratio 21 6 - 22 (calc)   Sodium 138 135 - 146 mmol/L   Potassium 4.7 3.5 - 5.3 mmol/L   Chloride 102 98 - 110 mmol/L   CO2 27 20 - 32 mmol/L   Calcium 9.2 8.6 - 10.4 mg/dL   Total Protein 7.5 6.1 - 8.1 g/dL   Albumin 3.8 3.6 - 5.1 g/dL   Globulin 3.7 1.9 - 3.7 g/dL (calc)   AG Ratio 1.0 1.0 - 2.5 (calc)   Total Bilirubin 0.4 0.2 - 1.2 mg/dL   Alkaline phosphatase (APISO) 47 37 - 153 U/L   AST 22 10 - 35 U/L   ALT 11 6 - 29 U/L  CBC with Differential/Platelet  Result Value Ref Range  WBC 4.9 3.8 - 10.8 Thousand/uL   RBC 3.99 3.80 - 5.10 Million/uL   Hemoglobin 11.0 (L) 11.7 - 15.5 g/dL   HCT 34.1 (L) 35.0 - 45.0 %   MCV 85.5 80.0 - 100.0 fL   MCH 27.6 27.0 - 33.0 pg   MCHC 32.3 32.0 - 36.0 g/dL   RDW 14.4 11.0 - 15.0 %   Platelets 313 140 - 400 Thousand/uL   MPV 10.4 7.5 - 12.5 fL   Neutro Abs 1,994 1,500 - 7,800 cells/uL   Lymphs Abs 2,328 850 - 3,900 cells/uL   Absolute Monocytes 397 200 - 950 cells/uL   Eosinophils Absolute 152 15 - 500 cells/uL   Basophils Absolute 29 0 - 200 cells/uL   Neutrophils Relative % 40.7 %   Total Lymphocyte 47.5 %   Monocytes Relative 8.1 %   Eosinophils Relative 3.1 %   Basophils Relative 0.6 %  CK  Result Value Ref Range   Total CK 90 29 - 143 U/L      Assessment & Plan:   Problem List Items Addressed This Visit   None  ***Refer mammogram  ***Refer    No orders of the defined types were placed in this encounter.     Follow up plan: No follow-ups on file.  Future labs ordered for ***   [name], DO [facility] 04/20/2022, 3:17 PM

## 2022-04-20 NOTE — Chronic Care Management (AMB) (Signed)
Chronic Care Management   CCM RN Visit Note  04/20/2022 Name: Ellen [name] MRN: [id number] DOB: 1929-03-29  Subjective: Ellen [name] is a 89 y.o. year old female who is a primary care patient of [name], DO. The patient was referred to the Chronic Care Management team for assistance with care management needs subsequent to provider initiation of CCM services and plan of care.    Today's Visit:   Spoke to the patients daughter  for  follow up visit due to the patient seeing pcp and needs expressed today .        Goals Addressed             This Visit's Progress    CCM Expected Outcome:  Monitor, Self-Manage and Reduce Symptoms of: History of Stroke       Current Barriers:  Knowledge Deficits related to the importance of seeking early help for sx and sx of heart attack and stroke. The patient is doing well post stroke.  Care Coordination needs related to resources in the home for home health in a patient with post CVA/TIA and post discharge from the hospital on 02-01-2022 Chronic Disease Management support and education needs related to effective management of post CVA/TIA and other chronic conditions  Non-adherence to scheduled provider appointments  Planned Interventions: Evaluation of current treatment plan related to CVA/TIA and post discharge needs related to CVA/TIA and patient's adherence to plan as established by provider. Per the patients daughter the patient is doing very well. She says for her mother to be 79 it is like she has not had a stroke at all. She is very active and is wanting the weather to stay warmer so she can go fishing. No new concerns related to CVA/TIA. Does call the daughter when she has new concerns come about. Will continue to monitor for changes or new needs.  Advised patient to keep appointments with pcp, write down questions and concerns to ask the pcp, and review of available resources to the patient. Spoke to the patients daughter,  Ellen [name] who is also DRP. Has several upcoming appointments. Reviewed with Ellen [name] today by phone. She will call for any new questions or concerns Provided education to patient re: the goals of the CCM team, available disciplines and resources, and help with needed support for the patient in the home setting post hospitalization. The patient is very independent. The patient has family members that have been checking on her daily. She continues to live alone. Ellen [name] states the patient is doing well and has not new concerns. Review of how to get in touch with the [facility] for any new needs between outreaches.  Reviewed medications with patient and discussed compliance. States compliance with medications. The patient is using a medication pill box for her medications. She saw the pcp last week for reconciliation of medications and her daughter went with her to assist and ask any new concerns  Provided patient with CVA educational materials related to warning sx and sx of new onset of CVA Reviewed scheduled/upcoming provider appointments including 05-18-2022 at 1040 amam with pcp. Saw pcp today and the RNCM assisted with new appointment needs and also called and discussed with the patients daughter who is assisting the patient with her appointments and needs.  Discussed plans with patient for ongoing care management follow up and provided patient with direct contact information for care management team Advised patient to discuss questions, concerns, needs of the patient, and ongoing support for effective management of chronic  conditions with provider Screening for signs and symptoms of depression related to chronic disease state  Assessed social determinant of health barriers The patient continues to work with home health in her home. Denies any new changes or needs.  Review of safety in the home. The patient has a life alert system in place. The patient did call her daughter when she felt numbness in her hand, arm, and  leg. Will continue to monitor for changes, needs, or concerns. The patients daughter has the [facility] number to call for assistance or new needs. Review of safety due to the patient with light headedness and dizziness.    Symptom Management: Take medications as prescribed   Attend all scheduled provider appointments Call provider office for new concerns or questions  call the Suicide and Crisis Lifeline: 988 call the [place] National Suicide Prevention Lifeline: 843-169-1908 or TTY: 646 519 1671 TTY 5045743116) to talk to a trained counselor call 1-800-273-TALK (toll free, 24 hour hotline) if experiencing a Mental Health or [place]   Follow Up Plan: Telephone follow up appointment with care management team member scheduled for: 06-14-2022 at 54 am       CCM Expected Outcome:  Monitor, Self-Manage, and Reduce Symptoms of Hypertension       Current Barriers:  Knowledge Deficits related to to the benefits of checking blood pressures and monitoring for changes in HTN and heart health  Care Coordination needs related to support and education post hospitalization  in a patient with HTN and other chronic conditions Chronic Disease Management support and education needs related to effective management of HTN BP Readings from Last 3 Encounters:  04/20/22 132/68  02/24/22 130/84  02/12/22 120/66     Planned Interventions: Evaluation of current treatment plan related to hypertension self management and patient's adherence to plan as established by provider. The patients blood pressures are stable. She is doing well over all. Feels she needs to find out why she is having pain in her left breast from where she had breast cancer 8 years ago. The patients daughter states that she may be thinking about it more because her other daughter is currently going through radiation treatments for breast cancer in her left breast. The patient had her appointment with the pcp today. Have assisted the  patient will getting appointments with Dr. Grayland Ormond at the cancer center on 04-23-2022 at [place] am and [facility] on 04-27-2022 at 220 pm. Have spoke to the patients daughter and she has the numbers and the appointment dates and times down.  Provided education to patient re: stroke prevention, s/s of heart attack and stroke. The patient has a history of stroke. Is doing well overall. Is driving and active. Wants to go fishing as this is something she loves to do.  Reviewed prescribed diet heart healthy/ADA diet. Education and support given Reviewed medications with patient and discussed importance of compliance. Endorses compliance with medications. The patient has her medications and is compliant with medications. Her daughter Ellen [name] assist with any new needs related to medications. Will continue to monitor. Did discuss the medication addition of Mirtazapine to her medication regimen. Will continue to monitor for changes or new needs.  Discussed plans with patient for ongoing care management follow up and provided patient with direct contact information for care management team; Advised patient, providing education and rationale, to monitor blood pressure daily and record, calling PCP for findings outside established parameters. Denies any new needs with her blood pressures.  Reviewed scheduled/upcoming provider appointments  including: 05-18-2022 at 1040, The patient saw the pcp today and orders for follow up have been placed. She will see Dr. Grayland Ormond on 04-23-2022 at 0945 am for follow up at the cancer center for left breast pain. She has a Mammogram scheduled for 04-27-2022 at 220 pm for bilateral breast. Her daughter has been provided with the number for [facility]  Address: [address] [place] [address] Phone: [phone]  Website: [email or web address]  for setting up an appointment for her to have her vision checked and she also has the number to [facility] to follow up for a  referral that was placed to day for follow up of shoulder pain. The daughter knows to call the [facility] for new questions or concerns.  Advised patient to discuss changes in blood pressures or heart health with provider; Provided education on prescribed diet heart healthy/ADA diet ;  Discussed complications of poorly controlled blood pressure such as heart disease, stroke, circulatory complications, vision complications, kidney impairment, sexual dysfunction;  Screening for signs and symptoms of depression related to chronic disease state;  Assessed social determinant of health barriers;   Symptom Management: Take medications as prescribed   Attend all scheduled provider appointments Call provider office for new concerns or questions  call the Suicide and Crisis Lifeline: 988 call the [place] National Suicide Prevention Lifeline: 928-550-1144 or TTY: 706-532-3328 TTY 321-821-7406) to talk to a trained counselor call 1-800-273-TALK (toll free, 24 hour hotline) if experiencing a Mental Health or [place]  check blood pressure 3 times per week write blood pressure results in a log or diary learn about high blood pressure keep a blood pressure log take blood pressure log to all doctor appointments call doctor for signs and symptoms of high blood pressure develop an action plan for high blood pressure keep all doctor appointments take medications for blood pressure exactly as prescribed report new symptoms to your doctor  Follow Up Plan: Telephone follow up appointment with care management team member scheduled for: 06-14-2022 at 1030 am          Plan:Telephone follow up appointment with care management team member scheduled for:  06-14-2022 at 52 am  Noreene Larsson RN, MSN, CCM RN Care Manager  Chronic Care Management Direct Number: [phone]

## 2022-04-20 NOTE — Progress Notes (Unsigned)
Subjective:    Patient ID: Ellen [name], female    DOB: [birth year], 89 y.o.   MRN: [id number]  Ellen [name] is a 89 y.o. female presenting on 04/20/2022 for Breast Pain (Pt. States that she has had pain in left breast for 3 months. She says that it's more of a throbbing feeling.) and Weight Loss (She is worried over her weight loss since previous appt.)  Patient without family member here today  HPI  Vision changes Asking about referral to Eye Doctor  Left Breast Pain, nodules Recently discussed problem with L breast pain and nodularity No showed diagnostic mammogram 02/2022 Throbbing pain at times History of breast cancer, has not returned to  Missed last mammogram due to recovering from stroke 01/30/22  Recurrent falls Chronic R shoulder injury with [facility] joint Fall injury R wrist had cast on it after ED visit Also has numbness associated on R side May need Orthopedic again, previously at [facility] clinic in 2023  Lost appetite, weight loss Maybe eating 1-2 meals per day      02/08/2022    2:36 PM 07/23/2021   10:15 AM 02/16/2021    9:50 AM  Depression screen PHQ 2/9  Decreased Interest 0 0 0  Down, Depressed, Hopeless 0 1 0  PHQ - 2 Score 0 1 0  Altered sleeping   0  Tired, decreased energy   0  Change in appetite   0  Feeling bad or failure about yourself    0  Trouble concentrating   0  Moving slowly or fidgety/restless   0  Suicidal thoughts   0  PHQ-9 Score   0  Difficult doing work/chores   Not difficult at all    Social History   Tobacco Use   Smoking status: Never   Smokeless tobacco: Never  Vaping Use   Vaping Use: Never used  Substance Use Topics   Alcohol use: Never   Drug use: Never    Review of Systems Per HPI unless specifically indicated above     Objective:    BP 132/68 (BP Location: Right Arm, Patient Position: Sitting)   Pulse 73   Ht 5' (1.524 m)   Wt 127 lb 12.8 oz (58 kg)   SpO2 99%   BMI 24.96 kg/m   Wt Readings from Last  3 Encounters:  04/20/22 127 lb 12.8 oz (58 kg)  02/24/22 130 lb 6.4 oz (59.1 kg)  02/12/22 132 lb 12.8 oz (60.2 kg)    Physical Exam Vitals and nursing note reviewed.  Constitutional:      General: She is not in acute distress.    Appearance: She is well-developed. She is not diaphoretic.     Comments: Well-appearing but thin, comfortable, cooperative  HENT:     Head: Normocephalic and atraumatic.  Eyes:     General:        Right eye: No discharge.        Left eye: No discharge.     Conjunctiva/sclera: Conjunctivae normal.  Neck:     Thyroid: No thyromegaly.  Cardiovascular:     Rate and Rhythm: Normal rate and regular rhythm.     Heart sounds: Normal heart sounds. No murmur heard. Pulmonary:     Effort: Pulmonary effort is normal. No respiratory distress.     Breath sounds: Normal breath sounds. No wheezing or rales.  Musculoskeletal:     Cervical back: Normal range of motion and neck supple.     Comments: Reduced range of  motion R shoulder limited above R shoulder level above head Impingement and rotator cuff testing positive for pain provoked.  Lymphadenopathy:     Cervical: No cervical adenopathy.  Skin:    General: Skin is warm and dry.     Findings: No erythema or rash.  Neurological:     Mental Status: She is alert and oriented to person, place, and time.  Psychiatric:        Behavior: Behavior normal.     Comments: Well groomed, good eye contact, normal speech and thoughts      Results for orders placed or performed in visit on 02/12/22  COMPLETE METABOLIC PANEL WITH GFR  Result Value Ref Range   Glucose, Bld 90 65 - 99 mg/dL   BUN 31 (H) 7 - 25 mg/dL   Creat 1.48 (H) 0.60 - 0.95 mg/dL   eGFR 33 (L) > OR = 60 mL/min/1.69m2   BUN/Creatinine Ratio 21 6 - 22 (calc)   Sodium 138 135 - 146 mmol/L   Potassium 4.7 3.5 - 5.3 mmol/L   Chloride 102 98 - 110 mmol/L   CO2 27 20 - 32 mmol/L   Calcium 9.2 8.6 - 10.4 mg/dL   Total Protein 7.5 6.1 - 8.1 g/dL    Albumin 3.8 3.6 - 5.1 g/dL   Globulin 3.7 1.9 - 3.7 g/dL (calc)   AG Ratio 1.0 1.0 - 2.5 (calc)   Total Bilirubin 0.4 0.2 - 1.2 mg/dL   Alkaline phosphatase (APISO) 47 37 - 153 U/L   AST 22 10 - 35 U/L   ALT 11 6 - 29 U/L  CBC with Differential/Platelet  Result Value Ref Range   WBC 4.9 3.8 - 10.8 Thousand/uL   RBC 3.99 3.80 - 5.10 Million/uL   Hemoglobin 11.0 (L) 11.7 - 15.5 g/dL   HCT 34.1 (L) 35.0 - 45.0 %   MCV 85.5 80.0 - 100.0 fL   MCH 27.6 27.0 - 33.0 pg   MCHC 32.3 32.0 - 36.0 g/dL   RDW 14.4 11.0 - 15.0 %   Platelets 313 140 - 400 Thousand/uL   MPV 10.4 7.5 - 12.5 fL   Neutro Abs 1,994 1,500 - 7,800 cells/uL   Lymphs Abs 2,328 850 - 3,900 cells/uL   Absolute Monocytes 397 200 - 950 cells/uL   Eosinophils Absolute 152 15 - 500 cells/uL   Basophils Absolute 29 0 - 200 cells/uL   Neutrophils Relative % 40.7 %   Total Lymphocyte 47.5 %   Monocytes Relative 8.1 %   Eosinophils Relative 3.1 %   Basophils Relative 0.6 %  CK  Result Value Ref Range   Total CK 90 29 - 143 U/L      Assessment & Plan:   Problem List Items Addressed This Visit     Essential hypertension   Relevant Medications   lisinopril-hydrochlorothiazide (ZESTORETIC) 20-12.5 MG tablet   Hemiparesis affecting dominant side as late effect of cerebrovascular accident (CVA) ([place])   History of CVA with residual deficit   Other Visit Diagnoses     Breast pain, left    -  Primary   Relevant Orders   MM 3D DIAGNOSTIC MAMMOGRAM BILATERAL BREAST   Mass of upper outer quadrant of left breast       Relevant Orders   MM 3D DIAGNOSTIC MAMMOGRAM BILATERAL BREAST   Paresthesia of right upper extremity       Relevant Orders   Ambulatory referral to Orthopedic Surgery   Loss of appetite  Relevant Medications   mirtazapine (REMERON) 15 MG tablet   Changes in vision       Relevant Orders   Ambulatory referral to Optometry   Chronic right shoulder pain       Relevant Medications   mirtazapine (REMERON)  15 MG tablet   Other Relevant Orders   Ambulatory referral to Orthopedic Surgery        History of CVA with residual deficits  Referred again for Mammogram diagnostic Contacted Noreene Larsson w/ CCM team and she has coordinated w/ [place] [facility] and patients daughter [name] to schedule upcoming diagnostic Mammogram  Also re-scheduled [facility] with Dr Grayland Ormond for surveillance given her concerns with history of breast cancer. She was overdue for follow up  Reduced Appetite / Unintentional weight loss Start Mirtazapine 15mg  nightly to improve appetite and weight gain  Referral to Orthopedics return to [place] Re-evaluation of recurrent fall injuries, chronic R Shoulder pain likely impingement with some nerve entrapment concerns now Would warrant repeat X-rays will defer today   Orders Placed This Encounter  Procedures   MM 3D DIAGNOSTIC MAMMOGRAM BILATERAL BREAST    Standing Status:   Future    Standing Expiration Date:   10/21/2022    Order Specific Question:   Reason for Exam (SYMPTOM  OR DIAGNOSIS REQUIRED)    Answer:   yearly diagnostic mammogram Left sided upper outer breast pain, history breast cancer    Order Specific Question:   Preferred imaging location?    Answer:   [place]   Ambulatory referral to Optometry    Referral Priority:   Routine    Referral Type:   Vision Transport planner)    Referral Reason:   Specialty Services Required    Requested Specialty:   Optometry    Number of Visits Requested:   1   Ambulatory referral to Orthopedic Surgery    Referral Priority:   Routine    Referral Type:   Surgical    Referral Reason:   Specialty Services Required    Requested Specialty:   Orthopedic Surgery    Number of Visits Requested:   1     Meds ordered this encounter  Medications   mirtazapine (REMERON) 15 MG tablet    Sig: Take 1 tablet (15 mg total) by mouth at bedtime.    Dispense:  30 tablet    Refill:  2   lisinopril-hydrochlorothiazide (ZESTORETIC)  20-12.5 MG tablet    Sig: Take 1 tablet by mouth daily.    Dispense:  90 tablet    Refill:  3      Follow up plan: Return in about 3 months (around 07/21/2022), or if symptoms worsen or fail to improve.   [name], [facility] 04/20/2022, 3:17 PM

## 2022-04-20 NOTE — Patient Instructions (Signed)
Please call the care guide team at [phone] if you need to cancel or reschedule your appointment.   If you are experiencing a Mental Health or [place] or need someone to talk to, please call the Suicide and Crisis Lifeline: 988 call the [place] National Suicide Prevention Lifeline: 812 834 8251 or TTY: 8146008047 TTY 330-242-2618) to talk to a trained counselor call 1-800-273-TALK (toll free, 24 hour hotline)   Following is a copy of the CCM Program Consent:  CCM service includes personalized support from designated clinical staff supervised by the physician, including individualized plan of care and coordination with other care providers 24/7 contact phone numbers for assistance for urgent and routine care needs. Service will only be billed when office clinical staff spend 20 minutes or more in a month to coordinate care. Only one practitioner may furnish and bill the service in a calendar month. The patient may stop CCM services at amy time (effective at the end of the month) by phone call to the office staff. The patient will be responsible for cost sharing (co-pay) or up to 20% of the service fee (after annual deductible is met)  Following is a copy of your full provider care plan:   Goals Addressed             This Visit's Progress    CCM Expected Outcome:  Monitor, Self-Manage and Reduce Symptoms of: History of Stroke       Current Barriers:  Knowledge Deficits related to the importance of seeking early help for sx and sx of heart attack and stroke. The patient is doing well post stroke.  Care Coordination needs related to resources in the home for home health in a patient with post CVA/TIA and post discharge from the hospital on 02-01-2022 Chronic Disease Management support and education needs related to effective management of post CVA/TIA and other chronic conditions  Non-adherence to scheduled provider appointments  Planned Interventions: Evaluation of  current treatment plan related to CVA/TIA and post discharge needs related to CVA/TIA and patient's adherence to plan as established by provider. Per the patients daughter the patient is doing very well. She says for her mother to be 74 it is like she has not had a stroke at all. She is very active and is wanting the weather to stay warmer so she can go fishing. No new concerns related to CVA/TIA. Does call the daughter when she has new concerns come about. Will continue to monitor for changes or new needs.  Advised patient to keep appointments with pcp, write down questions and concerns to ask the pcp, and review of available resources to the patient. Spoke to the patients daughter, Ellen [name] who is also DRP. Has several upcoming appointments. Reviewed with Ellen [name] today by phone. She will call for any new questions or concerns Provided education to patient re: the goals of the CCM team, available disciplines and resources, and help with needed support for the patient in the home setting post hospitalization. The patient is very independent. The patient has family members that have been checking on her daily. She continues to live alone. Ellen [name] states the patient is doing well and has not new concerns. Review of how to get in touch with the [facility] for any new needs between outreaches.  Reviewed medications with patient and discussed compliance. States compliance with medications. The patient is using a medication pill box for her medications. She saw the pcp last week for reconciliation of medications and her daughter went with her  to assist and ask any new concerns  Provided patient with CVA educational materials related to warning sx and sx of new onset of CVA Reviewed scheduled/upcoming provider appointments including 05-18-2022 at 1040 amam with pcp. Saw pcp today and the RNCM assisted with new appointment needs and also called and discussed with the patients daughter who is assisting the patient with her  appointments and needs.  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 Denies any new changes or needs.  Review of safety in the home. The patient has a life alert system in place. The patient did call her daughter when she felt numbness in her hand, arm, and leg. Will continue to monitor for changes, needs, or concerns. The patients daughter has the [facility] number to call for assistance or new needs. Review of safety due to the patient with light headedness and dizziness.    Symptom Management: Take medications as prescribed   Attend all scheduled provider appointments Call provider office for new concerns or questions  call the Suicide and Crisis Lifeline: 988 call the [place] National Suicide Prevention Lifeline: (438)569-7258 or TTY: 848-295-6496 TTY 847-749-0114) to talk to a trained counselor call 1-800-273-TALK (toll free, 24 hour hotline) if experiencing a Mental Health or [place]   Follow Up Plan: Telephone follow up appointment with care management team member scheduled for: 06-14-2022 at 41 am       CCM Expected Outcome:  Monitor, Self-Manage, and Reduce Symptoms of Hypertension       Current Barriers:  Knowledge Deficits related to to the benefits of checking blood pressures and monitoring for changes in HTN and heart health  Care Coordination needs related to support and education post hospitalization  in a patient with HTN and other chronic conditions Chronic Disease Management support and education needs related to effective  management of HTN BP Readings from Last 3 Encounters:  04/20/22 132/68  02/24/22 130/84  02/12/22 120/66     Planned Interventions: Evaluation of current treatment plan related to hypertension self management and patient's adherence to plan as established by provider. The patients blood pressures are stable. She is doing well over all. Feels she needs to find out why she is having pain in her left breast from where she had breast cancer 8 years ago. The patients daughter states that she may be thinking about it more because her other daughter is currently going through radiation treatments for breast cancer in her left breast. The patient had her appointment with the pcp today. Have assisted the patient will getting appointments with Dr. Grayland Ormond at the cancer center on 04-23-2022 at [address] am and [facility] on 04-27-2022 at 220 pm. Have spoke to the patients daughter and she has the numbers and the appointment dates and times down.  Provided education to patient re: stroke prevention, s/s of heart attack and stroke. The patient has a history of stroke. Is doing well overall. Is driving and active. Wants to go fishing as this is something she loves to do.  Reviewed prescribed diet heart healthy/ADA diet. Education and support given Reviewed medications with patient and discussed importance  of compliance. Endorses compliance with medications. The patient has her medications and is compliant with medications. Her daughter Ellen [name] assist with any new needs related to medications. Will continue to monitor. Did discuss the medication addition of Mirtazapine to her medication regimen. Will continue to monitor for changes or new needs.  Discussed plans with patient for ongoing care management follow up and provided patient with direct contact information for care management team; Advised patient, providing education and rationale, to monitor blood pressure daily and record, calling PCP for findings  outside established parameters. Denies any new needs with her blood pressures.  Reviewed scheduled/upcoming provider appointments including: 05-18-2022 at 1040, The patient saw the pcp today and orders for follow up have been placed. She will see Dr. Grayland Ormond on 04-23-2022 at 0945 am for follow up at the cancer center for left breast pain. She has a Mammogram scheduled for 04-27-2022 at 220 pm for bilateral breast. Her daughter has been provided with the number for [facility]  Address: [address] [place] [address] Phone: [phone]  Website: [email or web address]  for setting up an appointment for her to have her vision checked and she also has the number to [facility] to follow up for a referral that was placed to day for follow up of shoulder pain. The daughter knows to call the [facility] for new questions or concerns.  Advised patient to discuss changes in blood pressures or heart health with provider; Provided education on prescribed diet heart healthy/ADA diet ;  Discussed complications of poorly controlled blood pressure such as heart disease, stroke, circulatory complications, vision complications, kidney impairment, sexual dysfunction;  Screening for signs and symptoms of depression related to chronic disease state;  Assessed social determinant of health barriers;   Symptom Management: Take medications as prescribed   Attend all scheduled provider appointments Call provider office for new concerns or questions  call the Suicide and Crisis Lifeline: 988 call the [place] National Suicide Prevention Lifeline: (502) 282-0820 or TTY: 3463115953 TTY (514)038-8665) to talk to a trained counselor call 1-800-273-TALK (toll free, 24 hour hotline) if experiencing a Mental Health or [place]  check blood pressure 3 times per week write blood pressure results in a log or diary learn about high blood pressure keep a blood pressure log take blood pressure log to all doctor  appointments call doctor for signs and symptoms of high blood pressure develop an action plan for high blood pressure keep all doctor appointments take medications for blood pressure exactly as prescribed report new symptoms to your doctor  Follow Up Plan: Telephone follow up appointment with care management team member scheduled for: 06-14-2022 at 1030 am          Patient verbalizes understanding of instructions and care plan provided today and agrees to view in [place]. Active MyChart status and patient understanding of how to access instructions and care plan via MyChart confirmed with patient.     Telephone follow up appointment with care management team member scheduled for: 06-14-2022 at 55 am

## 2022-04-21 ENCOUNTER — Encounter: Payer: Self-pay | Admitting: Family Medicine

## 2022-04-23 ENCOUNTER — Encounter: Payer: Self-pay | Admitting: Oncology

## 2022-04-23 ENCOUNTER — Inpatient Hospital Stay: Payer: Medicare Other | Attending: Oncology | Admitting: Oncology

## 2022-04-23 VITALS — BP 172/70 | HR 70 | Temp 96.7°F | Resp 18 | Ht 60.0 in | Wt 126.0 lb

## 2022-04-23 DIAGNOSIS — Z801 Family history of malignant neoplasm of trachea, bronchus and lung: Secondary | ICD-10-CM | POA: Diagnosis not present

## 2022-04-23 DIAGNOSIS — Z9049 Acquired absence of other specified parts of digestive tract: Secondary | ICD-10-CM | POA: Insufficient documentation

## 2022-04-23 DIAGNOSIS — Z833 Family history of diabetes mellitus: Secondary | ICD-10-CM | POA: Insufficient documentation

## 2022-04-23 DIAGNOSIS — Z923 Personal history of irradiation: Secondary | ICD-10-CM | POA: Insufficient documentation

## 2022-04-23 DIAGNOSIS — Z170 Estrogen receptor positive status [ER+]: Secondary | ICD-10-CM | POA: Insufficient documentation

## 2022-04-23 DIAGNOSIS — Z823 Family history of stroke: Secondary | ICD-10-CM | POA: Insufficient documentation

## 2022-04-23 DIAGNOSIS — Z79899 Other long term (current) drug therapy: Secondary | ICD-10-CM | POA: Insufficient documentation

## 2022-04-23 DIAGNOSIS — M8580 Other specified disorders of bone density and structure, unspecified site: Secondary | ICD-10-CM | POA: Insufficient documentation

## 2022-04-23 DIAGNOSIS — Z8041 Family history of malignant neoplasm of ovary: Secondary | ICD-10-CM | POA: Insufficient documentation

## 2022-04-23 DIAGNOSIS — E119 Type 2 diabetes mellitus without complications: Secondary | ICD-10-CM | POA: Insufficient documentation

## 2022-04-23 DIAGNOSIS — Z8249 Family history of ischemic heart disease and other diseases of the circulatory system: Secondary | ICD-10-CM | POA: Diagnosis not present

## 2022-04-23 DIAGNOSIS — C50412 Malignant neoplasm of upper-outer quadrant of left female breast: Secondary | ICD-10-CM

## 2022-04-23 DIAGNOSIS — I10 Essential (primary) hypertension: Secondary | ICD-10-CM | POA: Insufficient documentation

## 2022-04-23 DIAGNOSIS — Z885 Allergy status to narcotic agent status: Secondary | ICD-10-CM | POA: Insufficient documentation

## 2022-04-23 DIAGNOSIS — Z7902 Long term (current) use of antithrombotics/antiplatelets: Secondary | ICD-10-CM | POA: Insufficient documentation

## 2022-04-23 DIAGNOSIS — Z90710 Acquired absence of both cervix and uterus: Secondary | ICD-10-CM | POA: Diagnosis not present

## 2022-04-23 NOTE — Progress Notes (Signed)
[place]  [phone]) [phone] Fax:(336) [phone]  ID: Ellen [name] OB: 10/08/1929  MR#: [id number]  [id number]  Patient Care Team: [name], DO as PCP - General (Family Medicine) Vanita Ingles, RN as Case Manager (General Practice) Curley Spice Virl Diamond, RPH-CPP as Pharmacist   CHIEF COMPLAINT: Stage Ia ER/PR positive, HER-2 negative invasive carcinoma of the left upper outer quadrant breast.   INTERVAL HISTORY: Patient last evaluated in clinic in January 2023.  She returns to clinic today for routine evaluation.  She currently feels well and is asymptomatic.  She has no neurologic complaints.  She denies any recent fevers or illnesses.  She has a good appetite and denies weight loss.  She denies any chest pain, shortness of breath, cough, or hemoptysis.  She denies any nausea, vomiting, constipation, or diarrhea.  She has no urinary complaints.  Patient offers no specific complaints today.  REVIEW OF SYSTEMS:   Review of Systems  Constitutional: Negative.  Negative for fever, malaise/fatigue and weight loss.  Respiratory: Negative.  Negative for cough, hemoptysis and shortness of breath.   Cardiovascular: Negative.  Negative for chest pain and leg swelling.  Gastrointestinal: Negative.  Negative for abdominal pain, blood in stool and melena.  Genitourinary: Negative.  Negative for dysuria.  Musculoskeletal: Negative.  Negative for back pain.  Skin: Negative.  Negative for rash.  Neurological: Negative.  Negative for focal weakness, weakness and headaches.  Psychiatric/Behavioral: Negative.  The patient is not nervous/anxious.     As per HPI. Otherwise, a complete review of systems is negative.  PAST MEDICAL HISTORY: Past Medical History:  Diagnosis Date   Arthritis    Cancer ([place]) 01/2018   left breast    Cataract    Chronic headaches    CKD (chronic kidney disease)    Diabetes mellitus without complication ([place])     Diverticulitis    Family history of ovarian cancer    History of syncope    Hyperlipidemia    Hypertension    Multinodular goiter    Personal history of radiation therapy    Primary osteoarthritis of left knee    Pulmonary hypertension ([place])     PAST SURGICAL HISTORY: Past Surgical History:  Procedure Laterality Date   ABDOMINAL HYSTERECTOMY     APPENDECTOMY     BREAST BIOPSY Left 01/03/2018   left breast stereo/x clip/positive   BREAST LUMPECTOMY Left 01/12/2018   BREAST LUMPECTOMY WITH NEEDLE LOCALIZATION Left 01/12/2018   Procedure: BREAST LUMPECTOMY WITH NEEDLE LOCALIZATION;  Surgeon: Jules Husbands, MD;  Location: [facility] ORS;  Service: General;  Laterality: Left;   CHOLECYSTECTOMY     COLON SURGERY     COLOSTOMY  2008   COLOSTOMY REVERSAL  2008   EYE SURGERY     REPLACEMENT TOTAL KNEE Right 2018   SENTINEL NODE BIOPSY Left 03/07/2018   Procedure: SENTINEL NODE BIOPSY;  Surgeon: Jules Husbands, MD;  Location: [facility] ORS;  Service: General;  Laterality: Left;    FAMILY HISTORY: Family History  Problem Relation Age of Onset   Lung cancer Mother 38   Diabetes Father    Stroke Father    Hypertension Maternal Grandmother    Hypertension Maternal Uncle    Hypertension Maternal Aunt    Ovarian cancer Maternal Aunt 59   Heart disease Daughter     ADVANCED DIRECTIVES (Y/N):  N  HEALTH MAINTENANCE: Social History   Tobacco Use   Smoking status: Never   Smokeless tobacco: Never  Vaping Use  Vaping Use: Never used  Substance Use Topics   Alcohol use: Never   Drug use: Never     Colonoscopy:  PAP:  Bone density:  Lipid panel:  Allergies  Allergen Reactions   Codeine Nausea Only   Morphine And Related Nausea Only    Current Outpatient Medications  Medication Sig Dispense Refill   Accu-Chek Softclix Lancets lancets Use to check blood sugar up to twice daily 200 each 3   acetaminophen (TYLENOL) 500 MG tablet Take 1,000 mg by mouth 2 (two) times daily as needed  for mild pain or moderate pain.     aspirin 81 MG chewable tablet Chew 81 mg by mouth daily.      atorvastatin (LIPITOR) 40 MG tablet Take 1 tablet (40 mg total) by mouth daily. 90 tablet 3   Blood Glucose Monitoring Suppl (ACCU-CHEK GUIDE) w/Device KIT Use as directed to monitor blood sugar 1 kit 0   cloNIDine (CATAPRES) 0.1 MG tablet TAKE 1 TABLET BY MOUTH DAILY AS NEEDED FOR ELEVATED BLOOD PRESSURE GREATER THAN 180/100/HEADACHE 30 tablet 2   clopidogrel (PLAVIX) 75 MG tablet Take 1 tablet (75 mg total) by mouth daily. 90 tablet 3   glucose blood (ACCU-CHEK GUIDE) test strip Use to check blood sugar up to twice daily 200 each 3   Lancets (ONETOUCH ULTRASOFT) lancets Use to check blood sugar daily, up to 2 times if need 100 each 3   lisinopril-hydrochlorothiazide (ZESTORETIC) 20-12.5 MG tablet Take 1 tablet by mouth daily. 90 tablet 3   mirtazapine (REMERON) 15 MG tablet Take 1 tablet (15 mg total) by mouth at bedtime. 30 tablet 2   Multiple Vitamins-Minerals (CERTAVITE SENIOR/ANTIOXIDANT) TABS Take 1 tablet by mouth daily. (Patient not taking: Reported on 04/20/2022)     No current facility-administered medications for this visit.    OBJECTIVE: Vitals:   04/23/22 1012  BP: (!) 172/70  Pulse: 70  Resp: 18  Temp: (!) 96.7 F (35.9 C)  SpO2: 100%     Body mass index is 24.61 kg/m.    ECOG FS:0 - Asymptomatic  General: Well-developed, well-nourished, no acute distress. Eyes: Pink conjunctiva, anicteric sclera. HEENT: Normocephalic, moist mucous membranes. Breast: Exam deferred today. Lungs: No audible wheezing or coughing. Heart: Regular rate and rhythm. Abdomen: Soft, nontender, no obvious distention. Musculoskeletal: No edema, cyanosis, or clubbing. Neuro: Alert, answering all questions appropriately. Cranial nerves grossly intact. Skin: No rashes or petechiae noted. Psych: Normal affect.   LAB RESULTS:  Lab Results  Component Value Date   NA 138 02/12/2022   K 4.7  02/12/2022   CL 102 02/12/2022   CO2 27 02/12/2022   GLUCOSE 90 02/12/2022   BUN 31 (H) 02/12/2022   CREATININE 1.48 (H) 02/12/2022   CALCIUM 9.2 02/12/2022   PROT 7.5 02/12/2022   ALBUMIN 3.3 (L) 01/30/2022   AST 22 02/12/2022   ALT 11 02/12/2022   ALKPHOS 39 01/30/2022   BILITOT 0.4 02/12/2022   GFRNONAA 36 (L) 01/30/2022   GFRAA 55 (L) 09/27/2019    Lab Results  Component Value Date   WBC 4.9 02/12/2022   NEUTROABS 1,994 02/12/2022   HGB 11.0 (L) 02/12/2022   HCT 34.1 (L) 02/12/2022   MCV 85.5 02/12/2022   PLT 313 02/12/2022     STUDIES: No results found.   ASSESSMENT: Stage Ia ER/PR positive, HER-2 negative invasive carcinoma of the left upper outer quadrant breast.    PLAN:  1. Stage Ia ER/PR positive, HER-2 negative invasive carcinoma of the left  upper outer quadrant breast: Given patient's advanced age, Oncotype DX was not ordered since patient declined chemotherapy.  Patient completed adjuvant XRT in approximately April 2020.  Patient refused treatment with letrozole despite acknowledging the increased risk of recurrence.  No intervention is needed at this time.  Patient's most recent mammogram on May 20, 2021 was reported as BI-RADS 2.  She has a appointment for mammogram scheduled for next week.  After lengthy discussion with the patient and her daughter, it was agreed upon that no further follow-up is necessary and that her primary care physician can monitor mammograms as needed.   2.  Osteopenia: Patient's most recent bone mineral density on September 08, 2018 reported T score of -1.7.  Previously calcium and vitamin D supplementation was recommended.  Continue monitoring and evaluation per primary care.    I spent a total of 20 minutes reviewing chart data, face-to-face evaluation with the patient, counseling and coordination of care as detailed above.    Patient expressed understanding and was in agreement with this plan. She also understands that She can call  clinic at any time with any questions, concerns, or complaints.      Cancer Staging  Primary cancer of upper outer quadrant of left female breast [facility]) Staging form: Breast, AJCC 8th Edition - Clinical stage from 01/11/2018: Stage IA (cT1b, cN0, cM0, G2, ER+, PR+, HER2-) - Signed by Lloyd Huger, MD on 02/10/2018 Nuclear grade: G2 Histologic grading system: 3 grade system Laterality: Left   Lloyd Huger, MD   04/23/2022 10:44 AM

## 2022-04-27 ENCOUNTER — Ambulatory Visit
Admission: RE | Admit: 2022-04-27 | Discharge: 2022-04-27 | Disposition: A | Discharge status: Home / Self Care | Payer: Medicare Other | Source: Ambulatory Visit | Attending: Family Medicine | Admitting: Family Medicine

## 2022-04-27 DIAGNOSIS — N644 Mastodynia: Secondary | ICD-10-CM | POA: Insufficient documentation

## 2022-04-27 DIAGNOSIS — N6321 Unspecified lump in the left breast, upper outer quadrant: Secondary | ICD-10-CM | POA: Diagnosis not present

## 2022-05-02 DIAGNOSIS — I10 Essential (primary) hypertension: Secondary | ICD-10-CM

## 2022-05-14 ENCOUNTER — Ambulatory Visit: Payer: Medicare Other | Admitting: Family Medicine

## 2022-05-18 ENCOUNTER — Encounter: Payer: Self-pay | Admitting: Family Medicine

## 2022-05-18 ENCOUNTER — Encounter: Payer: Self-pay | Admitting: Intensive Care

## 2022-05-18 ENCOUNTER — Emergency Department
Admission: EM | Admit: 2022-05-18 | Discharge: 2022-05-18 | Discharge status: Against Medical Advice | Payer: Medicare Other | Attending: Emergency Medicine | Admitting: Emergency Medicine

## 2022-05-18 ENCOUNTER — Ambulatory Visit (INDEPENDENT_AMBULATORY_CARE_PROVIDER_SITE_OTHER): Payer: Medicare Other | Admitting: Family Medicine

## 2022-05-18 ENCOUNTER — Other Ambulatory Visit: Payer: Self-pay

## 2022-05-18 VITALS — BP 136/80 | HR 68 | Ht 60.0 in | Wt 126.6 lb

## 2022-05-18 DIAGNOSIS — K921 Melena: Secondary | ICD-10-CM | POA: Diagnosis not present

## 2022-05-18 DIAGNOSIS — R1032 Left lower quadrant pain: Secondary | ICD-10-CM | POA: Diagnosis present

## 2022-05-18 DIAGNOSIS — R519 Headache, unspecified: Secondary | ICD-10-CM | POA: Insufficient documentation

## 2022-05-18 DIAGNOSIS — E1121 Type 2 diabetes mellitus with diabetic nephropathy: Secondary | ICD-10-CM

## 2022-05-18 DIAGNOSIS — R42 Dizziness and giddiness: Secondary | ICD-10-CM | POA: Insufficient documentation

## 2022-05-18 DIAGNOSIS — Z5321 Procedure and treatment not carried out due to patient leaving prior to being seen by health care provider: Secondary | ICD-10-CM | POA: Insufficient documentation

## 2022-05-18 DIAGNOSIS — M79604 Pain in right leg: Secondary | ICD-10-CM | POA: Diagnosis not present

## 2022-05-18 DIAGNOSIS — R197 Diarrhea, unspecified: Secondary | ICD-10-CM | POA: Diagnosis not present

## 2022-05-18 DIAGNOSIS — M79601 Pain in right arm: Secondary | ICD-10-CM | POA: Diagnosis not present

## 2022-05-18 LAB — COMPREHENSIVE METABOLIC PANEL
ALT: 10 U/L (ref 0–44)
AST: 25 U/L (ref 15–41)
Albumin: 3.7 g/dL (ref 3.5–5.0)
Alkaline Phosphatase: 45 U/L (ref 38–126)
Anion gap: 11 (ref 5–15)
BUN: 22 mg/dL (ref 8–23)
CO2: 25 mmol/L (ref 22–32)
Calcium: 9.3 mg/dL (ref 8.9–10.3)
Chloride: 104 mmol/L (ref 98–111)
Creatinine, Ser: 1.35 mg/dL — ABNORMAL HIGH (ref 0.44–1.00)
GFR, Estimated: 37 mL/min — ABNORMAL LOW (ref >60)
Glucose, Bld: 96 mg/dL (ref 70–99)
Potassium: 5.3 mmol/L — ABNORMAL HIGH (ref 3.5–5.1)
Sodium: 140 mmol/L (ref 135–145)
Total Bilirubin: 0.8 mg/dL (ref 0.3–1.2)
Total Protein: 7.8 g/dL (ref 6.5–8.1)

## 2022-05-18 LAB — CBC
HCT: 31.7 % — ABNORMAL LOW (ref 36.0–46.0)
Hemoglobin: 10 g/dL — ABNORMAL LOW (ref 12.0–15.0)
MCH: 27.5 pg (ref 26.0–34.0)
MCHC: 31.5 g/dL (ref 30.0–36.0)
MCV: 87.1 fL (ref 80.0–100.0)
Platelets: 338 10*3/uL (ref 150–400)
RBC: 3.64 MIL/uL — ABNORMAL LOW (ref 3.87–5.11)
RDW: 16.5 % — ABNORMAL HIGH (ref 11.5–15.5)
WBC: 4.4 10*3/uL (ref 4.0–10.5)
nRBC: 0 % (ref 0.0–0.2)

## 2022-05-18 LAB — POCT GLYCOSYLATED HEMOGLOBIN (HGB A1C): Hemoglobin A1C: 5.6 % (ref 4.0–5.6)

## 2022-05-18 LAB — LIPASE, BLOOD: Lipase: 39 U/L (ref 11–51)

## 2022-05-18 NOTE — Progress Notes (Signed)
Subjective:    Patient ID: Ellen [name], female    DOB: 1929-04-22, 89 y.o.   MRN: [id number]  Ellen [name] is a 89 y.o. female presenting on 05/18/2022 for Follow-up, Diabetes, and Dizziness (Pt. States she is feeling extremely dizzy for the last two weeks. Pt. Stumbled twice when walking into exam room.)   HPI   Dizziness Dark stools melena LLQ Abdominal pain Loose stool  Recent worsening symptoms past few weeks. She had issues with balance and stumbled when walking in today. Not using walker or cane. She drove herself to our office today. Describes dark stool liquid loose at times Not taking any pepto OTC or other medication to cause this She has history of anemia, last lab showed Hgb 11 (02/2022), prior range 9 to 11 She lives alone, and daughter is 89 miles away. Not here at apt with her today She had Left sided breast pain before and had mammogram work up prior She has history of breast cancer No prior or recent abdominal imaging  Denies room spinning vertigo, fever chills nausea vomiting, dyspnea or chest pain  Type 2 Diabetes A1c improved, 5.6 now on result today Prior 6+ range     02/08/2022    2:36 PM 07/23/2021   10:15 AM 02/16/2021    9:50 AM  Depression screen PHQ 2/9  Decreased Interest 0 0 0  Down, Depressed, Hopeless 0 1 0  PHQ - 2 Score 0 1 0  Altered sleeping   0  Tired, decreased energy   0  Change in appetite   0  Feeling bad or failure about yourself    0  Trouble concentrating   0  Moving slowly or fidgety/restless   0  Suicidal thoughts   0  PHQ-9 Score   0  Difficult doing work/chores   Not difficult at all    Social History   Tobacco Use   Smoking status: Never   Smokeless tobacco: Never  Vaping Use   Vaping Use: Never used  Substance Use Topics   Alcohol use: Never   Drug use: Never    Review of Systems Per HPI unless specifically indicated above     Objective:    BP 136/80 (BP Location: Right Arm, Patient Position:  Sitting)   Pulse 68   Ht 5' (1.524 m)   Wt 126 lb 9.6 oz (57.4 kg)   SpO2 98%   BMI 24.72 kg/m   Wt Readings from Last 3 Encounters:  05/18/22 126 lb 9.6 oz (57.4 kg)  04/23/22 126 lb (57.2 kg)  04/20/22 127 lb 12.8 oz (58 kg)    Physical Exam Vitals and nursing note reviewed.  Constitutional:      General: She is not in acute distress.    Appearance: She is well-developed. She is not diaphoretic.     Comments: well appearing age 89 yr female, some thin appearance, not having significant pain today, cooperative  HENT:     Head: Normocephalic and atraumatic.  Eyes:     General:        Right eye: No discharge.        Left eye: No discharge.     Conjunctiva/sclera: Conjunctivae normal.  Neck:     Thyroid: No thyromegaly.  Cardiovascular:     Rate and Rhythm: Normal rate and regular rhythm.     Heart sounds: Normal heart sounds. No murmur heard. Pulmonary:     Effort: Pulmonary effort is normal. No respiratory distress.  Breath sounds: Normal breath sounds. No wheezing or rales.  Abdominal:     General: There is no distension.     Tenderness: There is abdominal tenderness (LLQ).     Comments: Slow bowel sounds  Musculoskeletal:        General: Normal range of motion.     Cervical back: Normal range of motion and neck supple.     Comments: Able to ambulate and transition from seated to standing. Not using walker or cane.  Lymphadenopathy:     Cervical: No cervical adenopathy.  Skin:    General: Skin is warm and dry.     Findings: No erythema or rash.  Neurological:     Mental Status: She is alert and oriented to person, place, and time.  Psychiatric:        Behavior: Behavior normal.     Comments: Well groomed, good eye contact, normal speech and thoughts      Results for orders placed or performed in visit on 05/18/22  POCT HgB A1C  Result Value Ref Range   Hemoglobin A1C 5.6 4.0 - 5.6 %   HbA1c POC (<> result, manual entry)     HbA1c, POC (prediabetic  range)     HbA1c, POC (controlled diabetic range)        Assessment & Plan:   Problem List Items Addressed This Visit     Type 2 diabetes mellitus with diabetic nephropathy, without long-term current use of insulin - Primary   Relevant Orders   POCT HgB A1C (Completed)   Other Visit Diagnoses     Melena       Dizziness           Concern for acute worsening dizziness , dark stools, LLQ abdominal pain Cannot rule out GI Bleed, Diverticulitis or Symptomatic Anemia with her constellation of symptoms  Advised safest option is ED visit for labs and CT imaging and further evaluation given her symptoms.  Do not feel safe with her going home and not doing work up.  We called her daughter [name] and she is over 1.5 hours away, she will meet her at her house locally and they will go to ED. She has refused Ambulance EMS today and will sign out Against Medical Advice to go home first. However she is hemodynamically stable at this time and this is reasonable, as she did drive to our office and demonstrates she can ambulate without any assistance at this time.  She will need labs CBC work up and likely CT imaging given her constellation of symptoms.  Called ED triage to review case and they will be able to see patient when she arrives.  DM2 A1c today 5.6, improved. May have had less intake lately but overall her sugar is well controlled  No orders of the defined types were placed in this encounter.    Follow up plan: Return if symptoms worsen or fail to improve.   Saralyn Pilar, DO [facility] 05/18/2022, 11:02 AM

## 2022-05-18 NOTE — ED Triage Notes (Signed)
First nurse note: Pt to ED via POV from PCP. Pt reports dizziness, dark tarry stools and LLQ pain.

## 2022-05-18 NOTE — Patient Instructions (Signed)
Thank you for coming to the office today.    Please schedule a Follow-up Appointment to: No follow-ups on file.  If you have any other questions or concerns, please feel free to call the office or send a message through MyChart. You may also schedule an earlier appointment if necessary.  Additionally, you may be receiving a survey about your experience at our office within a few days to 1 week by e-mail or mail. We value your feedback.  Braden Deloach, DO [facility], [facility] 

## 2022-05-18 NOTE — ED Triage Notes (Signed)
Sent by pcp for dark tarry stools. Patient c/o left, lower sided abdominal pain and diarrhea. Also c/o right arm pain, right leg pain, and headache.

## 2022-05-19 ENCOUNTER — Emergency Department
Admission: EM | Admit: 2022-05-19 | Discharge: 2022-05-19 | Disposition: A | Discharge status: Home / Self Care | Payer: Medicare Other | Attending: Emergency Medicine | Admitting: Emergency Medicine

## 2022-05-19 ENCOUNTER — Emergency Department: Payer: Medicare Other

## 2022-05-19 ENCOUNTER — Other Ambulatory Visit: Payer: Self-pay

## 2022-05-19 DIAGNOSIS — R109 Unspecified abdominal pain: Secondary | ICD-10-CM

## 2022-05-19 DIAGNOSIS — K625 Hemorrhage of anus and rectum: Secondary | ICD-10-CM | POA: Insufficient documentation

## 2022-05-19 DIAGNOSIS — R1032 Left lower quadrant pain: Secondary | ICD-10-CM | POA: Diagnosis not present

## 2022-05-19 LAB — CBC WITH DIFFERENTIAL/PLATELET
Abs Immature Granulocytes: 0 10*3/uL (ref 0.00–0.07)
Basophils Absolute: 0 10*3/uL (ref 0.0–0.1)
Basophils Relative: 0 %
Eosinophils Absolute: 0.2 10*3/uL (ref 0.0–0.5)
Eosinophils Relative: 4 %
HCT: 31.1 % — ABNORMAL LOW (ref 36.0–46.0)
Hemoglobin: 9.7 g/dL — ABNORMAL LOW (ref 12.0–15.0)
Immature Granulocytes: 0 %
Lymphocytes Relative: 48 %
Lymphs Abs: 2.6 10*3/uL (ref 0.7–4.0)
MCH: 27.2 pg (ref 26.0–34.0)
MCHC: 31.2 g/dL (ref 30.0–36.0)
MCV: 87.1 fL (ref 80.0–100.0)
Monocytes Absolute: 0.5 10*3/uL (ref 0.1–1.0)
Monocytes Relative: 10 %
Neutro Abs: 2 10*3/uL (ref 1.7–7.7)
Neutrophils Relative %: 38 %
Platelets: 326 10*3/uL (ref 150–400)
RBC: 3.57 MIL/uL — ABNORMAL LOW (ref 3.87–5.11)
RDW: 16.6 % — ABNORMAL HIGH (ref 11.5–15.5)
WBC: 5.3 10*3/uL (ref 4.0–10.5)
nRBC: 0 % (ref 0.0–0.2)

## 2022-05-19 LAB — BASIC METABOLIC PANEL
Anion gap: 6 (ref 5–15)
BUN: 23 mg/dL (ref 8–23)
CO2: 25 mmol/L (ref 22–32)
Calcium: 8.7 mg/dL — ABNORMAL LOW (ref 8.9–10.3)
Chloride: 105 mmol/L (ref 98–111)
Creatinine, Ser: 1.38 mg/dL — ABNORMAL HIGH (ref 0.44–1.00)
GFR, Estimated: 36 mL/min — ABNORMAL LOW (ref >60)
Glucose, Bld: 98 mg/dL (ref 70–99)
Potassium: 4.5 mmol/L (ref 3.5–5.1)
Sodium: 136 mmol/L (ref 135–145)

## 2022-05-19 LAB — HEPATIC FUNCTION PANEL
ALT: 7 U/L (ref 0–44)
AST: 25 U/L (ref 15–41)
Albumin: 3.3 g/dL — ABNORMAL LOW (ref 3.5–5.0)
Alkaline Phosphatase: 46 U/L (ref 38–126)
Bilirubin, Direct: 0.1 mg/dL (ref 0.0–0.2)
Indirect Bilirubin: 0.5 mg/dL (ref 0.3–0.9)
Total Bilirubin: 0.6 mg/dL (ref 0.3–1.2)
Total Protein: 7.5 g/dL (ref 6.5–8.1)

## 2022-05-19 LAB — TROPONIN I (HIGH SENSITIVITY): Troponin I (High Sensitivity): 9 ng/L (ref <18)

## 2022-05-19 LAB — LIPASE, BLOOD: Lipase: 43 U/L (ref 11–51)

## 2022-05-19 MED ORDER — IOHEXOL 300 MG/ML  SOLN
75.0000 mL | Freq: Once | INTRAMUSCULAR | Status: AC | PRN
  Administered 2022-05-19: 75 mL via INTRAVENOUS

## 2022-05-19 NOTE — ED Notes (Signed)
PA in to triage to see pt.

## 2022-05-19 NOTE — Discharge Instructions (Addendum)
Your workup was reassuring we discussed that if there was black stool and was positive for blood we would definitely admit you to the hospital and that we could monitor you in the hospital overnight to keep an eye out for black stool and to monitor your hemoglobin levels but you have opted to want to go home which I think is reasonable given I do not see any active bleeding at this time.  However if you develop black stool please return to the ER with a stool sample so we can test it and recheck your hemoglobin otherwise follow-up for recheck hemoglobin in 2 days with your primary care doctor.  Your CT imaging was overall reassuring.

## 2022-05-19 NOTE — ED Provider Triage Note (Signed)
Emergency Medicine Provider Triage Evaluation Note  Ellen [name] , a 89 y.o. female  was evaluated in triage.  Pt complains of black tarry stool. Pt was seen in the ED yesterday for black tarry stool. She returns today requesting lab results. Denies n/v, CP, SOB.   Review of Systems  Positive: Abd pain Negative: Fever, n/v, weakness  Physical Exam  BP (!) 143/67   Pulse 69   Temp 98.7 F (37.1 C) (Oral)   Resp 16   Ht 5\' 1"  (1.549 m)   Wt 57 kg   SpO2 100%   BMI 23.74 kg/m  Gen:   Awake, no distress  Alert and answering questions in triage  Resp:  Normal effort lungs clear bilaterally  MSK:   Moves extremities without difficulty  Other:  Rebound tenderness and guarded in LLQ .Normal bowel sounds x 4.   Medical Decision Making  Medically screening exam initiated at 11:11 AM.  Appropriate orders placed.  Ellen [name] was informed that the remainder of the evaluation will be completed by another provider, this initial triage assessment does not replace that evaluation, and the importance of remaining in the ED until their evaluation is complete.     Romeo Apple, Roger Fasnacht A, PA-C 05/19/22 1130

## 2022-05-19 NOTE — ED Triage Notes (Signed)
Pt states she is here for for her test results that she had done yesterday. Pt states yesterday was having abd pain and bloody diarrhea. Pt states she no longer has pain but still has black tarry stools.

## 2022-05-19 NOTE — ED Provider Notes (Addendum)
[facility] Provider Note    Event Date/Time   First MD Initiated Contact with Patient 05/19/22 1157     (approximate)   History   would like test results and Rectal Bleeding   HPI  Ellen [name] is a 89 y.o. female who was seen yesterday for black tarry dark stools but left without being seen and comes back in with continued dark stools.  She denies any chest pain, shortness of breath or other concerns. She does report diverticulitis, surgery in the past on abdomen. She reports pain in the LLQ. She has had blood in stool- reports 8-9 months.   I reviewed the blood work from yesterday where her hemoglobin was 10.  Lipase normal CMP showed slight elevation of creatinine but similar to prior with a potassium of 5.3.  Patient reports that she does not currently have any pain.  She reports taking Tylenol this morning and the pain resolved.  She reports intermittent black stools for the past few months where symptoms will be black and sometimes it will be brown.  She states that being concerned if it could be blood.  She states that she has not had any recent colonoscopies.  She denies any alcohol or ibuprofen use.  She denies ever having a blood transfusion before bleeding in the past.  She denies any chest pain, shortness of breath or any other concerns  Pt is on plavix. Denies falls. Denies any NSAIDS or alcohol   Physical Exam   Triage Vital Signs: ED Triage Vitals  Enc Vitals Group     BP 05/19/22 1101 (!) 143/67     Pulse Rate 05/19/22 1101 69     Resp 05/19/22 1101 16     Temp 05/19/22 1101 98.7 F (37.1 C)     Temp Source 05/19/22 1101 Oral     SpO2 05/19/22 1101 100 %     Weight 05/19/22 1102 125 lb 10.6 oz (57 kg)     Height 05/19/22 1102  (1.549 m)     Head Circumference --      Peak Flow --      Pain Score 05/19/22 1102 0     Pain Loc --      Pain Edu? --      Excl. in GC? --     Most recent vital signs: Vitals:   05/19/22 1101   BP: (!) 143/67  Pulse: 69  Resp: 16  Temp: 98.7 F (37.1 C)  SpO2: 100%     General: Awake, no distress.  CV:  Good peripheral perfusion.  Resp:  Normal effort.  Abd:  No distention.  Soft nontender Other:  Good pulse in wrist. Some chronic pain in the elbow.    ED Results / Procedures / Treatments   Labs (all labs ordered are listed, but only abnormal results are displayed) Labs Reviewed  BASIC METABOLIC PANEL - Abnormal; Notable for the following components:      Result Value   Creatinine, Ser 1.38 (*)    Calcium 8.7 (*)    GFR, Estimated 36 (*)    All other components within normal limits  CBC WITH DIFFERENTIAL/PLATELET     EKG  My interpretation of EKG:  Patient declined EKG  RADIOLOGY I have reviewed the xray personally and agree with radiology read  IMPRESSION: 1. No acute osseous abnormality. 2. 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 3. Degenerative changes in the shoulder and elbow.  PROCEDURES:  Critical Care performed: No   Procedures   MEDICATIONS ORDERED IN ED: Medications  iohexol (OMNIPAQUE) 300 MG/ML solution 75 mL (has no administration in time range)     IMPRESSION / MDM / ASSESSMENT AND PLAN / ED COURSE  I reviewed the triage vital signs and the nursing notes.   Patient's presentation is most consistent with acute presentation with potential threat to life or bodily function.   Patient comes in with abdominal pain and potentially intermittent black stools.  I did perform a rectal exam and when I got out was very minimal in nature but did appear brown in nature Hemoccult negative.  Discussed with patient that if there was black stool and this is concerning for melena and these would definitely be admitted to the hospital.  Given patient's age and the concern for intermittent melena I did discuss with her admission for monitoring hemoglobin but patient states that she would really like to go home.  She reports  having a dog at home.  Given at this time her hemoglobin is 9.7 which is pretty similar to yesterday at 10 and she has had intermittently low hemoglobins around this previously I think this is reasonable however she understands to have a recheck hemoglobin done on Friday and that if the black stool returns to come back in with a stool sample to have it rechecked for blood.  We discussed things to avoid.  She did have a CT scan that was done without any evidence of diverticulitis.  I did want to get an EKG, cardiac marker given patient's age patient declined EKG but troponin was negative denies any chest pain or shortness of breath.  Lipase was normal.  Hemoglobin similar to yesterday BMP reassuring stable creatinine.  Troponin was negative.  LFTs normal    FINAL CLINICAL IMPRESSION(S) / ED DIAGNOSES   Final diagnoses:  Abdominal pain, unspecified abdominal location     Rx / DC Orders   ED Discharge Orders     None        Note:  This document was prepared using Dragon voice recognition software and may include unintentional dictation errors.   Concha Se, MD 05/19/22 1439    Concha Se, MD 05/19/22 [phone]

## 2022-05-20 ENCOUNTER — Other Ambulatory Visit: Payer: Self-pay | Admitting: Family Medicine

## 2022-05-20 ENCOUNTER — Ambulatory Visit (INDEPENDENT_AMBULATORY_CARE_PROVIDER_SITE_OTHER): Payer: Medicare Other

## 2022-05-20 DIAGNOSIS — I6930 Unspecified sequelae of cerebral infarction: Secondary | ICD-10-CM

## 2022-05-20 DIAGNOSIS — I10 Essential (primary) hypertension: Secondary | ICD-10-CM

## 2022-05-20 DIAGNOSIS — K921 Melena: Secondary | ICD-10-CM

## 2022-05-20 DIAGNOSIS — D649 Anemia, unspecified: Secondary | ICD-10-CM

## 2022-05-20 NOTE — Transitions of Care (Post Inpatient/ED Visit) (Cosign Needed)
   05/20/2022  Name: Ellen [name] MRN: [id number] DOB: 06-24-1929  Today's TOC FU Call Status: Today's TOC FU Call Status:: Successful TOC FU Call Competed TOC FU Call Complete Date: 05/20/22  Transition Care Management Follow-up Telephone Call Date of Discharge: 05/19/22 Discharge Facility: [facility]) Type of Discharge: Emergency Department Reason for ED Visit: Other: (abdominal pain, black stools) How have you been since you were released from the hospital?: Better Any questions or concerns?: Yes Patient Questions/Concerns:: Daughter wanted to make sure she did not have an appointment for Friday. Review of instructions with daughter, Ellen [name] Patient Questions/Concerns Addressed: Provided Patient Educational Materials, Notified Provider of Patient Questions/Concerns (will collaborate with the pcp for concerns the daughter expressed.)  Items Reviewed: Did you receive and understand the discharge instructions provided?: Yes Medications obtained and verified?: Yes (Medications Reviewed) Any new allergies since your discharge?: No Dietary orders reviewed?: NA Do you have support at home?: Yes People in Home: alone Name of Support/Comfort Primary Source: Ellen [name], her daughter  Home Care and Equipment/Supplies: Were Home Health Services Ordered?: NA Any new equipment or medical supplies ordered?: NA  Functional Questionnaire: Do you need assistance with bathing/showering or dressing?: No Do you need assistance with meal preparation?: No Do you need assistance with eating?: No Do you have difficulty maintaining continence: No Do you need assistance with getting out of bed/getting out of a chair/moving?: No Do you have difficulty managing or taking your medications?: No  Follow up appointments reviewed: PCP Follow-up appointment confirmed?: NA (saw pcp on 05-18-2022, collaboration with pcp by [facility]) Specialist Hospital Follow-up appointment confirmed?:  NA Do you need transportation to your follow-up appointment?: No Do you understand care options if your condition(s) worsen?: Yes-patient verbalized understanding    Alto Denver RN, MSN, CCM RN Care Manager  Chronic Care Management Direct Number: [phone]

## 2022-05-20 NOTE — Patient Instructions (Signed)
Please call the care guide team at [phone] if you need to cancel or reschedule your appointment.   If you are experiencing a Mental Health or Behavioral Health Crisis or need someone to talk to, please call the Suicide and Crisis Lifeline: 988 call the [place] National Suicide Prevention Lifeline: (601)720-1994 or TTY: (325) 564-0894 TTY 478-846-0714) to talk to a trained counselor call 1-800-273-TALK (toll free, 24 hour hotline)   Following is a copy of the CCM Program Consent:  CCM service includes personalized support from designated clinical staff supervised by the physician, including individualized plan of care and coordination with other care providers 24/7 contact phone numbers for assistance for urgent and routine care needs. Service will only be billed when office clinical staff spend 20 minutes or more in a month to coordinate care. Only one practitioner may furnish and bill the service in a calendar month. The patient may stop CCM services at amy time (effective at the end of the month) by phone call to the office staff. The patient will be responsible for cost sharing (co-pay) or up to 20% of the service fee (after annual deductible is met)  Following is a copy of your full provider care plan:  This was a TOC call. Spoke with the patients daughter.    Patient verbalizes understanding of instructions and care plan provided today and agrees to view in MyChart. Active MyChart status and patient understanding of how to access instructions and care plan via MyChart confirmed with patient.  Telephone follow up appointment with care management team member scheduled for: 06-14-2022 at 1030 am

## 2022-05-21 ENCOUNTER — Other Ambulatory Visit: Payer: Medicare Other

## 2022-05-21 DIAGNOSIS — D649 Anemia, unspecified: Secondary | ICD-10-CM

## 2022-05-21 DIAGNOSIS — K921 Melena: Secondary | ICD-10-CM

## 2022-05-21 LAB — CBC WITH DIFFERENTIAL/PLATELET
Absolute Monocytes: 405 cells/uL (ref 200–950)
Basophils Relative: 0.5 %
Hemoglobin: 10.4 g/dL — ABNORMAL LOW (ref 11.7–15.5)
Lymphs Abs: 3129 cells/uL (ref 850–3900)
RDW: 14.5 % (ref 11.0–15.0)
Total Lymphocyte: 54.9 %

## 2022-05-22 LAB — CBC WITH DIFFERENTIAL/PLATELET
Basophils Absolute: 29 cells/uL (ref 0–200)
Eosinophils Absolute: 211 cells/uL (ref 15–500)
Eosinophils Relative: 3.7 %
HCT: 32.7 % — ABNORMAL LOW (ref 35.0–45.0)
MCH: 27.6 pg (ref 27.0–33.0)
MCHC: 31.8 g/dL — ABNORMAL LOW (ref 32.0–36.0)
MCV: 86.7 fL (ref 80.0–100.0)
MPV: 10.3 fL (ref 7.5–12.5)
Monocytes Relative: 7.1 %
Neutro Abs: 1927 cells/uL (ref 1500–7800)
Neutrophils Relative %: 33.8 %
Platelets: 386 10*3/uL (ref 140–400)
RBC: 3.77 10*6/uL — ABNORMAL LOW (ref 3.80–5.10)
WBC: 5.7 10*3/uL (ref 3.8–10.8)

## 2022-05-22 LAB — IRON,TIBC AND FERRITIN PANEL
%SAT: 24 % (calc) (ref 16–45)
Ferritin: 167 ng/mL (ref 16–288)
Iron: 62 ug/dL (ref 45–160)
TIBC: 263 mcg/dL (calc) (ref 250–450)

## 2022-05-25 ENCOUNTER — Telehealth: Payer: Self-pay

## 2022-05-25 NOTE — Telephone Encounter (Signed)
Informed patient daughter [name] of results.

## 2022-05-25 NOTE — Telephone Encounter (Signed)
-----   Message from Smitty Cords, DO sent at 05/24/2022  7:06 PM EDT ----- Please contact patient to review the following (Does not check MyChart)  CBC Blood Counts - shows improved Hemoglobin to 10.4, similar to past baseline months ago. It has improved slightly from last result.  Anemia panel is normal. No evidence of significant iron deficiency anemia.  This is reassuring.  Saralyn Pilar, DO [facility] 05/24/2022, 7:06 PM

## 2022-06-01 DIAGNOSIS — I10 Essential (primary) hypertension: Secondary | ICD-10-CM

## 2022-06-14 ENCOUNTER — Ambulatory Visit (INDEPENDENT_AMBULATORY_CARE_PROVIDER_SITE_OTHER): Payer: Medicare Other

## 2022-06-14 ENCOUNTER — Telehealth: Payer: Medicare Other

## 2022-06-14 DIAGNOSIS — I10 Essential (primary) hypertension: Secondary | ICD-10-CM

## 2022-06-14 DIAGNOSIS — I6930 Unspecified sequelae of cerebral infarction: Secondary | ICD-10-CM

## 2022-06-14 NOTE — Chronic Care Management (AMB) (Signed)
Chronic Care Management   CCM RN Visit Note  06/14/2022 Name: Ellen [name] MRN: [id number] DOB: 12-13-1929  Subjective: Ellen [name] is a 89 y.o. year old female who is a primary care patient of Smitty Cords, DO. The patient was referred to the Chronic Care Management team for assistance with care management needs subsequent to provider initiation of CCM services and plan of care.    Today's Visit:  Engaged with patient by telephone for follow up visit.        Goals Addressed             This Visit's Progress    CCM Expected Outcome:  Monitor, Self-Manage and Reduce Symptoms of: History of Stroke       Current Barriers:  Knowledge Deficits related to the importance of seeking early help for sx and sx of heart attack and stroke. The patient is doing well post stroke.  Care Coordination needs related to resources in the home for home health in a patient with post CVA/TIA and post discharge from the hospital on 02-01-2022 Chronic Disease Management support and education needs related to effective management of post CVA/TIA and other chronic conditions  Non-adherence to scheduled provider appointments  Planned Interventions: Evaluation of current treatment plan related to CVA/TIA and post discharge needs related to CVA/TIA and patient's adherence to plan as established by provider. Per the patients daughter the patient is doing very well. She says for her mother to be 44 it is like she has not had a stroke at all. She is very active and is wanting the weather to stay warmer so she can go fishing. No new concerns related to CVA/TIA. Does call the daughter when she has new concerns come about. Will continue to monitor for changes or new needs. Ellen [name] states that the patient is doing well. She was with her all day yesterday and she is doing great. States that she is happy that her daughter has gotten someone to do her yard for her and she is thankful for this. She is ready for the  weather to stay warm so she can go fishing. Denies any new issues related to past history of stroke. Hemoglobin is now much better with recent labs. Advised patient to keep appointments with pcp, write down questions and concerns to ask the pcp, and review of available resources to the patient. Spoke to the patients daughter, Ellen [name] who is also DRP. Has several upcoming appointments. Reviewed with Ellen [name] today by phone. She will call for any new questions or concerns Provided education to patient re: the goals of the CCM team, available disciplines and resources, and help with needed support for the patient in the home setting post hospitalization. The patient is very independent. The patient has family members that have been checking on her daily. She continues to live alone. Ellen [name] states the patient is doing well and has not new concerns. Review of how to get in touch with the [facility] for any new needs between outreaches.  Reviewed medications with patient and discussed compliance. States compliance with medications. The patient is using a medication pill box for her medications. Is stable with her medications and is taking as directed. Her daughter Ellen [name] keeps a close watch on her medications and the patient.  Provided patient with CVA educational materials related to warning sx and sx of new onset of CVA Reviewed scheduled/upcoming provider appointments including: No upcoming appointments with the pcp. Saw in April. The daughter knows to call for any  changes or new concerns that may come up. Will continue to monitor for changes or new needs.  Discussed plans with patient for ongoing care management follow up and provided patient with direct contact information for care management team Advised patient to discuss questions, concerns, needs of the patient, and ongoing support for effective management of chronic conditions with provider Screening for signs and symptoms of depression related to chronic disease  state  Assessed social determinant of health barriers The patient continues to work with home health in her home. Denies any new changes or needs.  Review of safety in the home. The patient has a life alert system in place. The patient did call her daughter when she felt numbness in her hand, arm, and leg. Will continue to monitor for changes, needs, or concerns. The patients daughter has the [facility] number to call for assistance or new needs. Review of safety due to the patient with light headedness and dizziness.    Symptom Management: Take medications as prescribed   Attend all scheduled provider appointments Call provider office for new concerns or questions  call the Suicide and Crisis Lifeline: 988 call the [place] National Suicide Prevention Lifeline: 234-657-8842 or TTY: 825-557-5714 TTY 406-113-5725) to talk to a trained counselor call 1-800-273-TALK (toll free, 24 hour hotline) if experiencing a Mental Health or Behavioral Health Crisis   Follow Up Plan: Telephone follow up appointment with care management team member scheduled for: 08-02-2022 at 1030 am       CCM Expected Outcome:  Monitor, Self-Manage, and Reduce Symptoms of Hypertension       Current Barriers:  Knowledge Deficits related to to the benefits of checking blood pressures and monitoring for changes in HTN and heart health  Care Coordination needs related to support and education post hospitalization  in a patient with HTN and other chronic conditions Chronic Disease Management support and education needs related to effective management of HTN BP Readings from Last 3 Encounters:  05/19/22 134/68  05/18/22 136/80  04/23/22 (!) 172/70     Planned Interventions: Evaluation of current treatment plan related to hypertension self management and patient's adherence to plan as established by provider. The patients blood pressures are stable. She is doing well over all. Ellen [name] feels when she is experiencing the light headedness  and dizziness that she is rushing around. She had been concerned about her yard getting mowed and now that Ellen [name] has gotten someone to do her yard the patient is calmer. She was with her all day yesterday and states the patient is doing well. Denies any acute findings today. Knows to call for changes or new needs.  Provided education to patient re: stroke prevention, s/s of heart attack and stroke. The patient has a history of stroke. Is doing well overall. Is driving and active. Wants to go fishing as this is something she loves to do.  Reviewed prescribed diet heart healthy/ADA diet. Education and support given Reviewed medications with patient and discussed importance of compliance. Endorses compliance with medications. The patient has her medications and is compliant with medications. Her daughter Ellen [name] assist with any new needs related to medications. Will continue to monitor. Did discuss the medication addition of Mirtazapine to her medication regimen. Will continue to monitor for changes or new needs.  Discussed plans with patient for ongoing care management follow up and provided patient with direct contact information for care management team; Advised patient, providing education and rationale, to monitor blood pressure daily and record, calling PCP for  findings outside established parameters. Denies any new needs with her blood pressures.  Reviewed scheduled/upcoming provider appointments including: Saw the pcp in April and had blood work to check her hemoglobin and it is back up. The patient is doing well over all. Her daughter knows to call for changes or new needs. Website: [email or web address]  for setting up an appointment for her to have her vision checked and she also has the number to [facility] to follow up for a referral that was placed to day for follow up of shoulder pain. The daughter knows to call the [facility] for new questions or concerns.  Advised patient to discuss changes in  blood pressures or heart health with provider; Provided education on prescribed diet heart healthy/ADA diet ;  Discussed complications of poorly controlled blood pressure such as heart disease, stroke, circulatory complications, vision complications, kidney impairment, sexual dysfunction;  Screening for signs and symptoms of depression related to chronic disease state;  Assessed social determinant of health barriers;   Symptom Management: Take medications as prescribed   Attend all scheduled provider appointments Call provider office for new concerns or questions  call the Suicide and Crisis Lifeline: 988 call the [place] National Suicide Prevention Lifeline: (916)441-7898 or TTY: 878 064 9177 TTY 865 538 7976) to talk to a trained counselor call 1-800-273-TALK (toll free, 24 hour hotline) if experiencing a Mental Health or Behavioral Health Crisis  check blood pressure 3 times per week write blood pressure results in a log or diary learn about high blood pressure keep a blood pressure log take blood pressure log to all doctor appointments call doctor for signs and symptoms of high blood pressure develop an action plan for high blood pressure keep all doctor appointments take medications for blood pressure exactly as prescribed report new symptoms to your doctor  Follow Up Plan: Telephone follow up appointment with care management team member scheduled for: 08-02-2022 at 1030 am          Plan:Telephone follow up appointment with care management team member scheduled for:  08-02-2022 at 1030 am  Alto Denver RN, MSN, CCM RN Care Manager  Chronic Care Management Direct Number: [phone]

## 2022-06-14 NOTE — Patient Instructions (Signed)
Please call the care guide team at [phone] if you need to cancel or reschedule your appointment.   If you are experiencing a Mental Health or Behavioral Health Crisis or need someone to talk to, please call the Suicide and Crisis Lifeline: 988 call the [place] National Suicide Prevention Lifeline: (714)140-0348 or TTY: (424) 874-9386 TTY (609)590-6771) to talk to a trained counselor call 1-800-273-TALK (toll free, 24 hour hotline)   Following is a copy of the CCM Program Consent:  CCM service includes personalized support from designated clinical staff supervised by the physician, including individualized plan of care and coordination with other care providers 24/7 contact phone numbers for assistance for urgent and routine care needs. Service will only be billed when office clinical staff spend 20 minutes or more in a month to coordinate care. Only one practitioner may furnish and bill the service in a calendar month. The patient may stop CCM services at amy time (effective at the end of the month) by phone call to the office staff. The patient will be responsible for cost sharing (co-pay) or up to 20% of the service fee (after annual deductible is met)  Following is a copy of your full provider care plan:   Goals Addressed             This Visit's Progress    CCM Expected Outcome:  Monitor, Self-Manage and Reduce Symptoms of: History of Stroke       Current Barriers:  Knowledge Deficits related to the importance of seeking early help for sx and sx of heart attack and stroke. The patient is doing well post stroke.  Care Coordination needs related to resources in the home for home health in a patient with post CVA/TIA and post discharge from the hospital on 02-01-2022 Chronic Disease Management support and education needs related to effective management of post CVA/TIA and other chronic conditions  Non-adherence to scheduled provider appointments  Planned Interventions: Evaluation of  current treatment plan related to CVA/TIA and post discharge needs related to CVA/TIA and patient's adherence to plan as established by provider. Per the patients daughter the patient is doing very well. She says for her mother to be 54 it is like she has not had a stroke at all. She is very active and is wanting the weather to stay warmer so she can go fishing. No new concerns related to CVA/TIA. Does call the daughter when she has new concerns come about. Will continue to monitor for changes or new needs. [name] states that the patient is doing well. She was with her all day yesterday and she is doing great. States that she is happy that her daughter has gotten someone to do her yard for her and she is thankful for this. She is ready for the weather to stay warm so she can go fishing. Denies any new issues related to past history of stroke. Hemoglobin is now much better with recent labs. Advised patient to keep appointments with pcp, write down questions and concerns to ask the pcp, and review of available resources to the patient. Spoke to the patients daughter, [name] who is also DRP. Has several upcoming appointments. Reviewed with [name] today by phone. She will call for any new questions or concerns Provided education to patient re: the goals of the CCM team, available disciplines and resources, and help with needed support for the patient in the home setting post hospitalization. The patient is very independent. The patient has family members that have been checking on  her daily. She continues to live alone. [name] states the patient is doing well and has not new concerns. Review of how to get in touch with the [facility] for any new needs between outreaches.  Reviewed medications with patient and discussed compliance. States compliance with medications. The patient is using a medication pill box for her medications. Is stable with her medications and is taking as directed. Her daughter [name] keeps a close watch on  her medications and the patient.  Provided patient with CVA educational materials related to warning sx and sx of new onset of CVA Reviewed scheduled/upcoming provider appointments including: No upcoming appointments with the pcp. Saw in April. The daughter knows to call for any changes or new concerns that may come up. Will continue to monitor for changes or new needs.  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 Denies any new changes or needs.  Review of safety in the home. The patient has a life alert system in place. The patient did call her daughter when she felt numbness in her hand, arm, and leg. Will continue to monitor for changes, needs, or concerns. The patients daughter has the [facility] number to call for assistance or new needs. Review of safety due to the patient with light headedness and dizziness.    Symptom Management: Take medications as prescribed   Attend all scheduled provider appointments Call provider office for new concerns or questions  call the Suicide and Crisis Lifeline: 988 call the [place] National Suicide Prevention Lifeline: (616) 390-2893 or TTY: 806-850-7404 TTY (906) 805-8827) to talk to a trained counselor call 1-800-273-TALK (toll free, 24 hour hotline) if experiencing a Mental Health or Behavioral Health Crisis   Follow Up Plan: Telephone follow up appointment with care management team member scheduled for: 08-02-2022 at 1030 am       CCM Expected Outcome:  Monitor, Self-Manage, and Reduce Symptoms of Hypertension       Current  Barriers:  Knowledge Deficits related to to the benefits of checking blood pressures and monitoring for changes in HTN and heart health  Care Coordination needs related to support and education post hospitalization  in a patient with HTN and other chronic conditions Chronic Disease Management support and education needs related to effective management of HTN BP Readings from Last 3 Encounters:  05/19/22 134/68  05/18/22 136/80  04/23/22 (!) 172/70     Planned Interventions: Evaluation of current treatment plan related to hypertension self management and patient's adherence to plan as established by provider. The patients blood pressures are stable. She is doing well over all. [name] feels when she is experiencing the light headedness and dizziness that she is rushing around. She had been concerned about her yard getting mowed and now that [name] has gotten someone to do her yard the patient is calmer. She was with her all day yesterday and states the patient is doing well. Denies any acute findings today. Knows to call for changes or new needs.  Provided education to patient re: stroke prevention, s/s of heart attack and stroke. The patient has a history of stroke. Is doing well overall. Is driving and  active. Wants to go fishing as this is something she loves to do.  Reviewed prescribed diet heart healthy/ADA diet. Education and support given Reviewed medications with patient and discussed importance of compliance. Endorses compliance with medications. The patient has her medications and is compliant with medications. Her daughter [name] assist with any new needs related to medications. Will continue to monitor. Did discuss the medication addition of Mirtazapine to her medication regimen. Will continue to monitor for changes or new needs.  Discussed plans with patient for ongoing care management follow up and provided patient with direct contact information for care management team; Advised patient,  providing education and rationale, to monitor blood pressure daily and record, calling PCP for findings outside established parameters. Denies any new needs with her blood pressures.  Reviewed scheduled/upcoming provider appointments including: Saw the pcp in April and had blood work to check her hemoglobin and it is back up. The patient is doing well over all. Her daughter knows to call for changes or new needs. Website: [email or web address]  for setting up an appointment for her to have her vision checked and she also has the number to [facility] to follow up for a referral that was placed to day for follow up of shoulder pain. The daughter knows to call the [facility] for new questions or concerns.  Advised patient to discuss changes in blood pressures or heart health with provider; Provided education on prescribed diet heart healthy/ADA diet ;  Discussed complications of poorly controlled blood pressure such as heart disease, stroke, circulatory complications, vision complications, kidney impairment, sexual dysfunction;  Screening for signs and symptoms of depression related to chronic disease state;  Assessed social determinant of health barriers;   Symptom Management: Take medications as prescribed   Attend all scheduled provider appointments Call provider office for new concerns or questions  call the Suicide and Crisis Lifeline: 988 call the [place] National Suicide Prevention Lifeline: 203-082-5808 or TTY: 6618398508 TTY 986 752 4214) to talk to a trained counselor call 1-800-273-TALK (toll free, 24 hour hotline) if experiencing a Mental Health or Behavioral Health Crisis  check blood pressure 3 times per week write blood pressure results in a log or diary learn about high blood pressure keep a blood pressure log take blood pressure log to all doctor appointments call doctor for signs and symptoms of high blood pressure develop an action plan for high blood pressure keep all  doctor appointments take medications for blood pressure exactly as prescribed report new symptoms to your doctor  Follow Up Plan: Telephone follow up appointment with care management team member scheduled for: 08-02-2022 at 1030 am          Patient verbalizes understanding of instructions and care plan provided today and agrees to view in MyChart. Active MyChart status and patient understanding of how to access instructions and care plan via MyChart confirmed with patient.  Telephone follow up appointment with care management team member scheduled for: 08-02-2022 at 1030 am

## 2022-06-21 ENCOUNTER — Ambulatory Visit: Payer: Self-pay

## 2022-06-21 DIAGNOSIS — I10 Essential (primary) hypertension: Secondary | ICD-10-CM

## 2022-06-21 NOTE — Patient Instructions (Signed)
Please call the care guide team at [phone] if you need to cancel or reschedule your appointment.   If you are experiencing a Mental Health or Behavioral Health Crisis or need someone to talk to, please call the Suicide and Crisis Lifeline: 988 call the [place] National Suicide Prevention Lifeline: (307) 274-3945 or TTY: 445 738 4050 TTY 216-687-7075) to talk to a trained counselor call 1-800-273-TALK (toll free, 24 hour hotline)   Following is a copy of the CCM Program Consent:  CCM service includes personalized support from designated clinical staff supervised by the physician, including individualized plan of care and coordination with other care providers 24/7 contact phone numbers for assistance for urgent and routine care needs. Service will only be billed when office clinical staff spend 20 minutes or more in a month to coordinate care. Only one practitioner may furnish and bill the service in a calendar month. The patient may stop CCM services at amy time (effective at the end of the month) by phone call to the office staff. The patient will be responsible for cost sharing (co-pay) or up to 20% of the service fee (after annual deductible is met)  Following is a copy of your full provider care plan:   Goals Addressed             This Visit's Progress    CCM Expected Outcome:  Monitor, Self-Manage and Reduce Symptoms of: History of Stroke       Current Barriers:  Knowledge Deficits related to the importance of seeking early help for sx and sx of heart attack and stroke. The patient is doing well post stroke.  Care Coordination needs related to resources in the home for home health in a patient with post CVA/TIA and post discharge from the hospital on 02-01-2022 Chronic Disease Management support and education needs related to effective management of post CVA/TIA and other chronic conditions  Non-adherence to scheduled provider appointments  Planned Interventions: Evaluation of  current treatment plan related to CVA/TIA and post discharge needs related to CVA/TIA and patient's adherence to plan as established by provider. Per the patients daughter the patient is doing very well. She says for her mother to be 63 it is like she has not had a stroke at all. She is very active and is wanting the weather to stay warmer so she can go fishing. No new concerns related to CVA/TIA. [name] called the RNCM due to sx and sx the patient is having. The patient was with her yesterday and was having some episodes of "tunnel vision" and having to hold on to the vehicle. She states that she is having headaches and then other times light headedness and dizziness. The patient daughter is concerned and is asking for recommendations. Education on the patient being at higher risk of heart attack and stroke due to having a stroke in the past and also with stated HA her blood pressures could be high. The patient will see a NP in her home from [facility] on Wednesday and then the [facility] recommended that the patient follow up with the pcp. Will collaborate with the pcp accordingly.  Advised patient to keep appointments with pcp, write down questions and concerns to ask the pcp, and review of available resources to the patient. Spoke to the patients daughter, [name] who is also DRP. Has several upcoming appointments. Sent and in basket message to the administrative staff to get the patient an appointment for follow up with the pcp after Wednesday for new onset of HA and  then periods of light headedness and dizziness.  Provided education to patient re: the goals of the CCM team, available disciplines and resources, and help with needed support for the patient in the home setting post hospitalization. The patient is very independent. The patient has family members that have been checking on her daily. She continues to live alone. [name] states the patient is doing well and has not new concerns. Review of how to get in touch with  the [facility] for any new needs between outreaches.  Reviewed medications with patient and discussed compliance. States compliance with medications. The patient is using a medication pill box for her medications. Is stable with her medications and is taking as directed. Her daughter [name] keeps a close watch on her medications and the patient.  Provided patient with CVA educational materials related to warning sx and sx of new onset of CVA Reviewed scheduled/upcoming provider appointments including: The office to call and get an appointment for follow up due to new sx and sx the patient is having.  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 Denies any new changes or needs.  Review of safety in the home. The patient has a life alert system in place. The patient did call her daughter when she felt numbness in her hand, arm, and leg. Will continue to monitor for changes, needs, or concerns. The patients daughter has the [facility] number to call for assistance or new needs. Review of safety due to the patient with light headedness and dizziness. Discussed with the daughter today about discussing the patient moving closer to her so that she would be available to see her more often and check on her. The patient does not want to give up her garden or her dog. Reflective listening and support given.    Symptom Management: Take medications as prescribed   Attend all scheduled provider appointments Call provider office for new concerns or questions  call the Suicide and Crisis Lifeline:  988 call the [place] National Suicide Prevention Lifeline: (681)247-2291 or TTY: 229-514-1328 TTY (743)155-3180) to talk to a trained counselor call 1-800-273-TALK (toll free, 24 hour hotline) if experiencing a Mental Health or Behavioral Health Crisis   Follow Up Plan: Telephone follow up appointment with care management team member scheduled for: 08-02-2022 at 1030 am       CCM Expected Outcome:  Monitor, Self-Manage, and Reduce Symptoms of Hypertension       Current Barriers:  Knowledge Deficits related to to the benefits of checking blood pressures and monitoring for changes in HTN and heart health  Care Coordination needs related to support and education post hospitalization  in a patient with HTN and other chronic conditions Chronic Disease Management support and education needs related to effective management of HTN BP Readings from Last 3 Encounters:  05/19/22 134/68  05/18/22 136/80  04/23/22 (!) 172/70     Planned Interventions: Evaluation of current treatment plan related to hypertension self management and patient's adherence to plan as established by provider. The patients blood pressures are stable. She is doing well over all. [name] feels when she is experiencing the light headedness  and dizziness that she is rushing around. [name] states yesterday she was having light headedness and dizziness. She states that this is happening more frequently. Explained it possibly being orthostatic hypotension when changing positions. Also discussed making sure the patient stays hydrated. Will get and appointment with the pcp for follow up about new concerns.   Provided education to patient re: stroke prevention, s/s of heart attack and stroke. The patient has a history of stroke. Is doing well overall. Is driving and active. Wants to go fishing as this is something she loves to do. The patient has a history of strokes. Education that if she is having a headache she may be experiencing high blood  pressures. The patients daughter states that she does not have a blood pressure cuff but she has 2 at her  house and she is taking one to her on Wednesday. Review of taking blood pressures when she has these episodes.  Reviewed prescribed diet heart healthy/ADA diet. Education and support given Reviewed medications with patient and discussed importance of compliance. Endorses compliance with medications. The patient has her medications and is compliant with medications. Her daughter [name] assist with any new needs related to medications. Will continue to monitor. Did discuss the medication addition of Mirtazapine to her medication regimen. Will continue to monitor for changes or new needs.  Discussed plans with patient for ongoing care management follow up and provided patient with direct contact information for care management team; Advised patient, providing education and rationale, to monitor blood pressure daily and record, calling PCP for findings outside established parameters. Denies any new needs with her blood pressures.  Reviewed scheduled/upcoming provider appointments including: Aministrative staff to call and get an appointment with the pcp after Wednesday due to some new sx and sx she is having.  Website: [email or web address]  for setting up an appointment for her to have her vision checked and she also has the number to [facility] to follow up for a referral that was placed to day for follow up of shoulder pain. The daughter knows to call the [facility] for new questions or concerns.  Advised patient to discuss changes in blood pressures or heart health with provider; Provided education on prescribed diet heart healthy/ADA diet ;  Discussed complications of poorly controlled blood pressure such as heart disease, stroke, circulatory complications, vision complications, kidney impairment, sexual dysfunction;  Screening for signs and symptoms of depression related to chronic disease state;   Assessed social determinant of health barriers;   Symptom Management: Take medications as prescribed   Attend all scheduled provider appointments Call provider office for new concerns or questions  call the Suicide and Crisis Lifeline: 988 call the [place] National Suicide Prevention Lifeline: 775-006-9284 or TTY: 407-725-8720 TTY 334-768-3279) to talk to a trained counselor call 1-800-273-TALK (toll free, 24 hour hotline) if experiencing a Mental Health or Behavioral Health Crisis  check blood pressure 3 times per week write blood pressure results in a log or diary learn about high blood pressure keep a blood pressure log take blood pressure log to all doctor appointments call doctor for signs and symptoms of high blood pressure develop an action plan for high blood pressure keep all doctor appointments take medications for blood pressure exactly as prescribed report new symptoms to your doctor  Follow Up Plan: Telephone follow up appointment with care management team member scheduled for: 08-02-2022 at 1030 am          Patient verbalizes understanding of instructions and care plan  provided today and agrees to view in MyChart. Active MyChart status and patient understanding of how to access instructions and care plan via MyChart confirmed with patient.  Telephone follow up appointment with care management team member scheduled for: 08-02-2022 at 1030 am

## 2022-06-21 NOTE — Chronic Care Management (AMB) (Signed)
Chronic Care Management   CCM RN Visit Note  06/21/2022 Name: Ellen [name] MRN: [id number] DOB: 11-15-1929  Subjective: Ellen [name] is a 89 y.o. year old female who is a primary care patient of Smitty Cords, DO. The patient was referred to the Chronic Care Management team for assistance with care management needs subsequent to provider initiation of CCM services and plan of care.    Today's Visit:  Engaged with patient by telephone for follow up visit.        Goals Addressed             This Visit's Progress    CCM Expected Outcome:  Monitor, Self-Manage and Reduce Symptoms of: History of Stroke       Current Barriers:  Knowledge Deficits related to the importance of seeking early help for sx and sx of heart attack and stroke. The patient is doing well post stroke.  Care Coordination needs related to resources in the home for home health in a patient with post CVA/TIA and post discharge from the hospital on 02-01-2022 Chronic Disease Management support and education needs related to effective management of post CVA/TIA and other chronic conditions  Non-adherence to scheduled provider appointments  Planned Interventions: Evaluation of current treatment plan related to CVA/TIA and post discharge needs related to CVA/TIA and patient's adherence to plan as established by provider. Per the patients daughter the patient is doing very well. She says for her mother to be 68 it is like she has not had a stroke at all. She is very active and is wanting the weather to stay warmer so she can go fishing. No new concerns related to CVA/TIA. [name] called the RNCM due to sx and sx the patient is having. The patient was with her yesterday and was having some episodes of "tunnel vision" and having to hold on to the vehicle. She states that she is having headaches and then other times light headedness and dizziness. The patient daughter is concerned and is asking for recommendations.  Education on the patient being at higher risk of heart attack and stroke due to having a stroke in the past and also with stated HA her blood pressures could be high. The patient will see a NP in her home from [facility] on Wednesday and then the [facility] recommended that the patient follow up with the pcp. Will collaborate with the pcp accordingly.  Advised patient to keep appointments with pcp, write down questions and concerns to ask the pcp, and review of available resources to the patient. Spoke to the patients daughter, [name] who is also DRP. Has several upcoming appointments. Sent and in basket message to the administrative staff to get the patient an appointment for follow up with the pcp after Wednesday for new onset of HA and then periods of light headedness and dizziness.  Provided education to patient re: the goals of the CCM team, available disciplines and resources, and help with needed support for the patient in the home setting post hospitalization. The patient is very independent. The patient has family members that have been checking on her daily. She continues to live alone. [name] states the patient is doing well and has not new concerns. Review of how to get in touch with the [facility] for any new needs between outreaches.  Reviewed medications with patient and discussed compliance. States compliance with medications. The patient is using a medication pill box for her medications. Is stable with her medications and is taking as directed.  Her daughter [name] keeps a close watch on her medications and the patient.  Provided patient with CVA educational materials related to warning sx and sx of new onset of CVA Reviewed scheduled/upcoming provider appointments including: The office to call and get an appointment for follow up due to new sx and sx the patient is having.  Discussed plans with patient for ongoing care management follow up and provided patient with direct contact information for care management  team Advised patient to discuss questions, concerns, needs of the patient, and ongoing support for effective management of chronic conditions with provider Screening for signs and symptoms of depression related to chronic disease state  Assessed social determinant of health barriers The patient continues to work with home health in her home. Denies any new changes or needs.  Review of safety in the home. The patient has a life alert system in place. The patient did call her daughter when she felt numbness in her hand, arm, and leg. Will continue to monitor for changes, needs, or concerns. The patients daughter has the [facility] number to call for assistance or new needs. Review of safety due to the patient with light headedness and dizziness. Discussed with the daughter today about discussing the patient moving closer to her so that she would be available to see her more often and check on her. The patient does not want to give up her garden or her dog. Reflective listening and support given.    Symptom Management: Take medications as prescribed   Attend all scheduled provider appointments Call provider office for new concerns or questions  call the Suicide and Crisis Lifeline: 988 call the [place] National Suicide Prevention Lifeline: (315)250-4878 or TTY: 409-873-9315 TTY 720-810-9938) to talk to a trained counselor call 1-800-273-TALK (toll free, 24 hour hotline) if experiencing a Mental Health or Behavioral Health Crisis   Follow Up Plan: Telephone follow up appointment with care management team member scheduled for: 08-02-2022 at 1030 am       CCM Expected Outcome:  Monitor, Self-Manage, and Reduce Symptoms of Hypertension       Current Barriers:  Knowledge Deficits related to to the benefits of checking blood pressures and monitoring for changes in HTN and heart health  Care Coordination needs related to support and education post hospitalization  in a patient with HTN and other chronic  conditions Chronic Disease Management support and education needs related to effective management of HTN BP Readings from Last 3 Encounters:  05/19/22 134/68  05/18/22 136/80  04/23/22 (!) 172/70     Planned Interventions: Evaluation of current treatment plan related to hypertension self management and patient's adherence to plan as established by provider. The patients blood pressures are stable. She is doing well over all. [name] feels when she is experiencing the light headedness and dizziness that she is rushing around. [name] states yesterday she was having light headedness and dizziness. She states that this is happening more frequently. Explained it possibly being orthostatic hypotension when changing positions. Also discussed making sure the patient stays hydrated. Will get and appointment with the pcp for follow up about new concerns.   Provided education to patient re: stroke prevention, s/s of heart attack and stroke. The patient has a history of stroke. Is doing well overall. Is driving and active. Wants to go fishing as this is something she loves to do. The patient has a history of strokes. Education that if she is having a headache she may be experiencing high blood pressures. The  patients daughter states that she does not have a blood pressure cuff but she has 2 at her  house and she is taking one to her on Wednesday. Review of taking blood pressures when she has these episodes.  Reviewed prescribed diet heart healthy/ADA diet. Education and support given Reviewed medications with patient and discussed importance of compliance. Endorses compliance with medications. The patient has her medications and is compliant with medications. Her daughter [name] assist with any new needs related to medications. Will continue to monitor. Did discuss the medication addition of Mirtazapine to her medication regimen. Will continue to monitor for changes or new needs.  Discussed plans with patient for  ongoing care management follow up and provided patient with direct contact information for care management team; Advised patient, providing education and rationale, to monitor blood pressure daily and record, calling PCP for findings outside established parameters. Denies any new needs with her blood pressures.  Reviewed scheduled/upcoming provider appointments including: Aministrative staff to call and get an appointment with the pcp after Wednesday due to some new sx and sx she is having.  Website: [email or web address]  for setting up an appointment for her to have her vision checked and she also has the number to [facility] to follow up for a referral that was placed to day for follow up of shoulder pain. The daughter knows to call the [facility] for new questions or concerns.  Advised patient to discuss changes in blood pressures or heart health with provider; Provided education on prescribed diet heart healthy/ADA diet ;  Discussed complications of poorly controlled blood pressure such as heart disease, stroke, circulatory complications, vision complications, kidney impairment, sexual dysfunction;  Screening for signs and symptoms of depression related to chronic disease state;  Assessed social determinant of health barriers;   Symptom Management: Take medications as prescribed   Attend all scheduled provider appointments Call provider office for new concerns or questions  call the Suicide and Crisis Lifeline: 988 call the [place] National Suicide Prevention Lifeline: 908-430-1816 or TTY: 579 585 4576 TTY 318 807 0017) to talk to a trained counselor call 1-800-273-TALK (toll free, 24 hour hotline) if experiencing a Mental Health or Behavioral Health Crisis  check blood pressure 3 times per week write blood pressure results in a log or diary learn about high blood pressure keep a blood pressure log take blood pressure log to all doctor appointments call doctor for signs and symptoms of  high blood pressure develop an action plan for high blood pressure keep all doctor appointments take medications for blood pressure exactly as prescribed report new symptoms to your doctor  Follow Up Plan: Telephone follow up appointment with care management team member scheduled for: 08-02-2022 at 1030 am          Plan:Telephone follow up appointment with care management team member scheduled for:  08-02-2022 at 1030 am  Alto Denver RN, MSN, CCM RN Care Manager  Chronic Care Management Direct Number: [phone]

## 2022-06-29 ENCOUNTER — Encounter: Payer: Self-pay | Admitting: Family Medicine

## 2022-06-29 ENCOUNTER — Ambulatory Visit (INDEPENDENT_AMBULATORY_CARE_PROVIDER_SITE_OTHER): Payer: Medicare Other | Admitting: Family Medicine

## 2022-06-29 VITALS — BP 130/62 | HR 62 | Ht 61.0 in | Wt 124.0 lb

## 2022-06-29 DIAGNOSIS — M25512 Pain in left shoulder: Secondary | ICD-10-CM

## 2022-06-29 DIAGNOSIS — I10 Essential (primary) hypertension: Secondary | ICD-10-CM | POA: Diagnosis not present

## 2022-06-29 DIAGNOSIS — M62838 Other muscle spasm: Secondary | ICD-10-CM | POA: Diagnosis not present

## 2022-06-29 DIAGNOSIS — G8929 Other chronic pain: Secondary | ICD-10-CM | POA: Diagnosis not present

## 2022-06-29 DIAGNOSIS — M25511 Pain in right shoulder: Secondary | ICD-10-CM | POA: Diagnosis not present

## 2022-06-29 MED ORDER — BLOOD PRESSURE MONITORING DEVI
0 refills | Status: AC
Start: 2022-06-29 — End: ?

## 2022-06-29 MED ORDER — BACLOFEN 10 MG PO TABS
5.0000 mg | ORAL_TABLET | Freq: Three times a day (TID) | ORAL | 2 refills | Status: DC | PRN
Start: 2022-06-29 — End: 2023-03-17

## 2022-06-29 MED ORDER — TRAMADOL HCL 50 MG PO TABS
50.0000 mg | ORAL_TABLET | Freq: Four times a day (QID) | ORAL | 0 refills | Status: AC | PRN
Start: 2022-06-29 — End: 2022-07-04

## 2022-06-29 NOTE — Progress Notes (Signed)
Subjective:    Patient ID: Ellen [name], female    DOB: [birth year], 89 y.o.   MRN: [id number]  Ellen [name] is a 89 y.o. female presenting on 06/29/2022 for Neck Pain (Right side of neck hurts and unable to turn, started a few days ago per patient)   HPI  CHRONIC HTN: Reports doing fairly well with BP control. Needs new BP cuff monitor, request rx today Current Meds - Lisinopril-HCTZ 20-12.5mg  daily, Clonidine  PRN Reports good compliance, took meds today. Tolerating well, w/o complaints. Admits Dizziness, Lightheadedness when stand up Denies CP, dyspnea, HA, edema  Chronic Pain / Shoulders / Neck Right Shoulder, suspected chronic rotator cuff tear Chronic Humerus Fracture Prior imaging reviewed. She has old prior fracture R humerus seen on last imaging 05/2022 Prior fall injuries She has been managing R shoulder rotator cuff for >4 years, we have seen her for it and trial on injections subacromial limited or mixed results. Unsure how long it was effective for. She is dealing with muscle spasms in neck now with pain due to shoulder. Right neck pain worse with turning OFF Gabapentin side effect Taking Tylenol Ext Timed release 500mg  x 2 = 1000mg  up to 3 times day max, but not always adhering to it. Cannot take codeine or morphine. She has tried Tramadol in past and did well, no longer has rx. Off muscle relaxant, would like to re-try  Abdominal symptoms pain and nausea vomiting resolved       02/08/2022    2:36 PM 07/23/2021   10:15 AM 02/16/2021    9:50 AM  Depression screen PHQ 2/9  Decreased Interest 0 0 0  Down, Depressed, Hopeless 0 1 0  PHQ - 2 Score 0 1 0  Altered sleeping   0  Tired, decreased energy   0  Change in appetite   0  Feeling bad or failure about yourself    0  Trouble concentrating   0  Moving slowly or fidgety/restless   0  Suicidal thoughts   0  PHQ-9 Score   0  Difficult doing work/chores   Not difficult at all    Social History   Tobacco  Use   Smoking status: Never   Smokeless tobacco: Never  Vaping Use   Vaping Use: Never used  Substance Use Topics   Alcohol use: Never   Drug use: Never    Review of Systems Per HPI unless specifically indicated above     Objective:    BP 130/62 (Patient Position: Standing)   Pulse 62   Ht 5\' 1"  (1.549 m)   Wt 124 lb (56.2 kg)   SpO2 98%   BMI 23.43 kg/m   Wt Readings from Last 3 Encounters:  06/29/22 124 lb (56.2 kg)  05/19/22 125 lb 10.6 oz (57 kg)  05/18/22 126 lb 9.6 oz (57.4 kg)    Physical Exam Vitals and nursing note reviewed.  Constitutional:      General: She is not in acute distress.    Appearance: Normal appearance. She is well-developed. She is not diaphoretic.     Comments: Well-appearing, comfortable, cooperative  HENT:     Head: Normocephalic and atraumatic.  Eyes:     General:        Right eye: No discharge.        Left eye: No discharge.     Conjunctiva/sclera: Conjunctivae normal.  Neck:     Comments: Neck spasm hypertonicity paraspinal and trapezius muscles reduced ROM R  Cardiovascular:  Rate and Rhythm: Normal rate.  Pulmonary:     Effort: Pulmonary effort is normal.  Musculoskeletal:     Cervical back: Tenderness present. No rigidity.     Comments: R shoulder limited ROM flexion, unable to test rotator cuff strength or impingement.  Skin:    General: Skin is warm and dry.     Findings: No erythema or rash.  Neurological:     Mental Status: She is alert and oriented to person, place, and time.  Psychiatric:        Mood and Affect: Mood normal.        Behavior: Behavior normal.        Thought Content: Thought content normal.     Comments: Well groomed, good eye contact, normal speech and thoughts    Results for orders placed or performed in visit on 05/21/22  Iron, TIBC and Ferritin Panel  Result Value Ref Range   Iron 62 45 - 160 mcg/dL   TIBC 540 981 - 191 mcg/dL (calc)   %SAT 24 16 - 45 % (calc)   Ferritin 167 16 - 288  ng/mL  CBC with Differential/Platelet  Result Value Ref Range   WBC 5.7 3.8 - 10.8 Thousand/uL   RBC 3.77 (L) 3.80 - 5.10 Million/uL   Hemoglobin 10.4 (L) 11.7 - 15.5 g/dL   HCT 47.8 (L) 29.5 - 62.1 %   MCV 86.7 80.0 - 100.0 fL   MCH 27.6 27.0 - 33.0 pg   MCHC 31.8 (L) 32.0 - 36.0 g/dL   RDW 30.8 65.7 - 84.6 %   Platelets 386 140 - 400 Thousand/uL   MPV 10.3 7.5 - 12.5 fL   Neutro Abs 1,927 1,500 - 7,800 cells/uL   Lymphs Abs 3,129 850 - 3,900 cells/uL   Absolute Monocytes 405 200 - 950 cells/uL   Eosinophils Absolute 211 15 - 500 cells/uL   Basophils Absolute 29 0 - 200 cells/uL   Neutrophils Relative % 33.8 %   Total Lymphocyte 54.9 %   Monocytes Relative 7.1 %   Eosinophils Relative 3.7 %   Basophils Relative 0.5 %      Assessment & Plan:   Problem List Items Addressed This Visit     Chronic pain of both shoulders   Relevant Medications   baclofen (LIORESAL) 10 MG tablet   traMADol (ULTRAM) 50 MG tablet   Essential hypertension   Relevant Medications   Blood Pressure Monitoring DEVI   Other Visit Diagnoses     Chronic right shoulder pain    -  Primary   Relevant Medications   baclofen (LIORESAL) 10 MG tablet   traMADol (ULTRAM) 50 MG tablet   Muscle spasms of neck       Relevant Medications   baclofen (LIORESAL) 10 MG tablet   traMADol (ULTRAM) 50 MG tablet       Chronic Pain R Shoulder Rotator Cuff, suspected tear based on imaging R Humerus fracture, chronic  Discussion today on pain management. She is not candidate for surgical intervention and prefers to avoid this for shoulder/arm.  Will pursue more aggressive pain management and medication options  Tylenol Ext Str 500mg  x 2 = 1000mg  up to 3 times per day, goal to take every day. - max 24 hour is 3000mg  or 6 tablets.  Start taking Baclofen (Lioresal) 10mg  (muscle relaxant) - start with half (cut) to one whole pill at night as needed for next 1-3 nights (may make you drowsy, caution with driving) see  how it  affects you, then if tolerated increase to one pill 2 to 3 times a day or (every 8 hours as needed)  Add Tramadol 50mg  as needed for pain as back up plan. Let me know if need more.  If unsuccessful, would pursue Pain Management referral for injections  #HYPERTENSION Improved control today No change to meds Will print rx BP cuff  Meds ordered this encounter  Medications   Blood Pressure Monitoring DEVI    Sig: Use to check BP daily    Dispense:  1 Device    Refill:  0   baclofen (LIORESAL) 10 MG tablet    Sig: Take 0.5-1 tablets (5-10 mg total) by mouth 3 (three) times daily as needed for muscle spasms.    Dispense:  90 each    Refill:  2   traMADol (ULTRAM) 50 MG tablet    Sig: Take 1 tablet (50 mg total) by mouth every 6 (six) hours as needed for up to 5 days.    Dispense:  20 tablet    Refill:  0      Follow up plan: Return in about 4 weeks (around 07/27/2022) for 4 week follow-up Pain / Shoulder updates.   Saralyn Pilar, DO [facility] 06/29/2022, 12:07 PM

## 2022-06-29 NOTE — Patient Instructions (Addendum)
Thank you for coming to the office today.  Tylenol Ext Str 500mg  x 2 = 1000mg  up to 3 times per day, goal to take every day. - max 24 hour is 3000mg  or 6 tablets.  Start taking Baclofen (Lioresal) 10mg  (muscle relaxant) - start with half (cut) to one whole pill at night as needed for next 1-3 nights (may make you drowsy, caution with driving) see how it affects you, then if tolerated increase to one pill 2 to 3 times a day or (every 8 hours as needed)  Add Tramadol 50mg  as needed for pain as back up plan. Let me know if need more.  Please schedule a Follow-up Appointment to: Return in about 4 weeks (around 07/27/2022) for 4 week follow-up Pain / Shoulder updates.  If you have any other questions or concerns, please feel free to call the office or send a message through MyChart. You may also schedule an earlier appointment if necessary.  Additionally, you may be receiving a survey about your experience at our office within a few days to 1 week by e-mail or mail. We value your feedback.  Saralyn Pilar, DO [facility], [place]

## 2022-07-02 DIAGNOSIS — I10 Essential (primary) hypertension: Secondary | ICD-10-CM

## 2022-07-13 ENCOUNTER — Telehealth: Payer: Self-pay

## 2022-07-13 NOTE — Chronic Care Management (AMB) (Signed)
Error

## 2022-07-13 NOTE — Telephone Encounter (Signed)
   CCM RN Visit Note   @DATE @ Name: Ellen [name] MRN: [id number]      DOB: September 19, 1929  Subjective: Ellen [name] is a 89 y.o. year old female who is a primary care patient of Smitty Cords, DO. The patient was referred to the Chronic Care Management team for assistance with care management needs subsequent to provider initiation of CCM services and plan of care.      Today's Visit:  Incoming call with message from the patients daughter and also spoke to the patient  for follow up visit.     The patient has concerns about a lump that is not hurting in her left breast. The patient is concerned as she wants this to be checked out and she does not wish to have a new mammogram. The patients daughter was on the line with the patient. Review of collaboration with Dr. Althea Charon and see what his recommendations are. After collaboration with Dr. Althea Charon he agrees to see the patient in the office for evaluation and further recommendations. A message has been sent to the scheduler to reach out and schedule and appointment for follow up with the pcp.    Plan:Telephone follow up appointment with care management team member scheduled for:  08-02-2022  Alto Denver RN, MSN, CCM RN Care Manager  Chronic Care Management Direct Number: [phone]

## 2022-07-27 ENCOUNTER — Encounter: Payer: Self-pay | Admitting: Family Medicine

## 2022-07-27 ENCOUNTER — Ambulatory Visit (INDEPENDENT_AMBULATORY_CARE_PROVIDER_SITE_OTHER): Payer: Medicare Other | Admitting: Family Medicine

## 2022-07-27 VITALS — BP 176/78 | HR 60 | Temp 98.0°F | Resp 17 | Ht 61.0 in | Wt 124.0 lb

## 2022-07-27 DIAGNOSIS — Q678 Other congenital deformities of chest: Secondary | ICD-10-CM | POA: Diagnosis not present

## 2022-07-27 NOTE — Progress Notes (Unsigned)
Subjective:    Patient ID: Ellen [name], female    DOB: 1929-07-01, 89 y.o.   MRN: [id number]  Ellen [name] is a 89 y.o. female presenting on 07/27/2022 for Breast Mass (Lump in the right breast x 3 weeks. With history of breast cancer. )   HPI  Right Chest Wall abnormality Reported 3 weeks, asymptomatic, identified after losing weight, not mobile, not soft mass, not part of breast tissue. Not tender or red or ulcerated Declines pursuing any further diagnostic testing with mammo / [place] Denies fever chills or drainage      02/08/2022    2:36 PM 07/23/2021   10:15 AM 02/16/2021    9:50 AM  Depression screen PHQ 2/9  Decreased Interest 0 0 0  Down, Depressed, Hopeless 0 1 0  PHQ - 2 Score 0 1 0  Altered sleeping   0  Tired, decreased energy   0  Change in appetite   0  Feeling bad or failure about yourself    0  Trouble concentrating   0  Moving slowly or fidgety/restless   0  Suicidal thoughts   0  PHQ-9 Score   0  Difficult doing work/chores   Not difficult at all    Social History   Tobacco Use   Smoking status: Never   Smokeless tobacco: Never  Vaping Use   Vaping Use: Never used  Substance Use Topics   Alcohol use: Never   Drug use: Never    Review of Systems Per HPI unless specifically indicated above     Objective:    BP (!) 176/78 (BP Location: Right Arm, Patient Position: Sitting, Cuff Size: Normal)   Pulse 60   Temp 98 F (36.7 C) (Oral)   Resp 17   Ht 5\' 1"  (1.549 m)   Wt 124 lb (56.2 kg)   SpO2 98%   BMI 23.43 kg/m   Wt Readings from Last 3 Encounters:  07/27/22 124 lb (56.2 kg)  06/29/22 124 lb (56.2 kg)  05/19/22 125 lb 10.6 oz (57 kg)    Physical Exam Vitals and nursing note reviewed.  Constitutional:      General: She is not in acute distress.    Appearance: Normal appearance. She is well-developed. She is not diaphoretic.     Comments: Well-appearing, comfortable, cooperative  HENT:     Head: Normocephalic and atraumatic.   Eyes:     General:        Right eye: No discharge.        Left eye: No discharge.     Conjunctiva/sclera: Conjunctivae normal.  Neck:     Thyroid: No thyromegaly.  Cardiovascular:     Rate and Rhythm: Normal rate and regular rhythm.     Heart sounds: Normal heart sounds. No murmur heard. Pulmonary:     Effort: Pulmonary effort is normal. No respiratory distress.     Breath sounds: Normal breath sounds. No wheezing or rales.  Musculoskeletal:        General: Normal range of motion.     Cervical back: Normal range of motion and neck supple.  Lymphadenopathy:     Cervical: No cervical adenopathy.  Skin:    General: Skin is warm and dry.     Findings: No erythema or rash.     Comments: Chest/Breast exam chaperoned by Laurel Dimmer CMA, however patient remained covered with regards to her breasts, since the area in question is more midline at sternum.  Chest wall area of concern  is R edge of sternal notch and manubrium / sternum. No actual nodular density within breast. No concerning features.  Neurological:     Mental Status: She is alert and oriented to person, place, and time.  Psychiatric:        Mood and Affect: Mood normal.        Behavior: Behavior normal.        Thought Content: Thought content normal.     Comments: Well groomed, good eye contact, normal speech and thoughts     I have personally reviewed the radiology report from Diagnostic Mammogram 04/27/22 bilateral   CLINICAL DATA:  Pain in the upper outer left breast above the level the lumpectomy site.   EXAM: DIGITAL DIAGNOSTIC BILATERAL MAMMOGRAM WITH TOMOSYNTHESIS; ULTRASOUND LEFT BREAST LIMITED   TECHNIQUE: Bilateral digital diagnostic mammography and breast tomosynthesis was performed.; Targeted ultrasound examination of the left breast was performed.   COMPARISON:  Previous exam(s).   ACR Breast Density Category b: There are scattered areas of fibroglandular density.   FINDINGS: Left lumpectomy  changes are stable. No new or suspicious findings in either breast.   Targeted ultrasound is performed, showing no sonographic abnormality to explain the patient's pain.   IMPRESSION: No mammographic or sonographic evidence of malignancy.   RECOMMENDATION: Treatment of the patient's symptoms should be based on clinical and physical exam given lack of imaging findings. Recommend annual screening mammography.   I have discussed the findings and recommendations with the patient. If applicable, a reminder letter will be sent to the patient regarding the next appointment.   BI-RADS CATEGORY  2: Benign.     Electronically Signed   By: Gerome Sam III M.D.   On: 04/27/2022 16:22  Results for orders placed or performed in visit on 05/21/22  Iron, TIBC and Ferritin Panel  Result Value Ref Range   Iron 62 45 - 160 mcg/dL   TIBC 562 130 - 865 mcg/dL (calc)   %SAT 24 16 - 45 % (calc)   Ferritin 167 16 - 288 ng/mL  CBC with Differential/Platelet  Result Value Ref Range   WBC 5.7 3.8 - 10.8 Thousand/uL   RBC 3.77 (L) 3.80 - 5.10 Million/uL   Hemoglobin 10.4 (L) 11.7 - 15.5 g/dL   HCT 78.4 (L) 69.6 - 29.5 %   MCV 86.7 80.0 - 100.0 fL   MCH 27.6 27.0 - 33.0 pg   MCHC 31.8 (L) 32.0 - 36.0 g/dL   RDW 28.4 13.2 - 44.0 %   Platelets 386 140 - 400 Thousand/uL   MPV 10.3 7.5 - 12.5 fL   Neutro Abs 1,927 1,500 - 7,800 cells/uL   Lymphs Abs 3,129 850 - 3,900 cells/uL   Absolute Monocytes 405 200 - 950 cells/uL   Eosinophils Absolute 211 15 - 500 cells/uL   Basophils Absolute 29 0 - 200 cells/uL   Neutrophils Relative % 33.8 %   Total Lymphocyte 54.9 %   Monocytes Relative 7.1 %   Eosinophils Relative 3.7 %   Basophils Relative 0.5 %      Assessment & Plan:   Problem List Items Addressed This Visit   None Visit Diagnoses     Chest wall asymmetry    -  Primary       Localized area of her concern is actually benign bony landmark, R upper sternal border with bony protrusion  of manubrium / sternal area. Not a growth or mass of the breast.  No intervention or treatment required.  No orders  of the defined types were placed in this encounter.     Follow up plan: Return if symptoms worsen or fail to improve.  Saralyn Pilar, DO [facility] 07/27/2022, 11:56 AM

## 2022-07-28 ENCOUNTER — Encounter: Payer: Self-pay | Admitting: Family Medicine

## 2022-07-28 NOTE — Patient Instructions (Signed)
° °  Please schedule a Follow-up Appointment to: No follow-ups on file. ° °If you have any other questions or concerns, please feel free to call the office or send a message through MyChart. You may also schedule an earlier appointment if necessary. ° °Additionally, you may be receiving a survey about your experience at our office within a few days to 1 week by e-mail or mail. We value your feedback. ° °Naijah Lacek, DO °[facility] °

## 2022-08-02 ENCOUNTER — Telehealth: Payer: Self-pay

## 2022-08-02 ENCOUNTER — Telehealth: Payer: Medicare Other

## 2022-08-02 NOTE — Telephone Encounter (Signed)
   CCM RN Visit Note   08-02-2022 Name: [name] MRN: [id number]      DOB: 01/19/1930  Subjective: Ellen [name] is a 89 y.o. year old female who is a primary care patient ofDr. Althea Charon. The patient was referred to the Chronic Care Management team for assistance with care management needs subsequent to provider initiation of CCM services and plan of care.      An unsuccessful telephone outreach was attempted today to contact the patient about Chronic Care Management needs.    Plan:A HIPAA compliant phone message was left for the patient providing contact information and requesting a return call.  Alto Denver RN, MSN, CCM RN Care Manager  Chronic Care Management Direct Number: [phone]

## 2022-09-08 ENCOUNTER — Telehealth: Payer: Self-pay | Admitting: Family Medicine

## 2022-09-08 NOTE — Telephone Encounter (Signed)
Copied from CRM [id number]. Topic: Medicare AWV >> Sep 08, 2022  2:50 PM Payton Doughty wrote: Reason for CRM: Called 09/08/2022 to sched AWV - NO VOICEMAIL  Verlee Rossetti; Care Guide Ambulatory Clinical Support [facility] Direct Dial: [phone]

## 2022-09-29 ENCOUNTER — Emergency Department: Payer: Medicare Other

## 2022-09-29 ENCOUNTER — Other Ambulatory Visit: Payer: Self-pay

## 2022-09-29 ENCOUNTER — Emergency Department
Admission: EM | Admit: 2022-09-29 | Discharge: 2022-09-29 | Disposition: A | Discharge status: Home / Self Care | Payer: Medicare Other | Attending: Emergency Medicine | Admitting: Emergency Medicine

## 2022-09-29 DIAGNOSIS — I10 Essential (primary) hypertension: Secondary | ICD-10-CM | POA: Insufficient documentation

## 2022-09-29 DIAGNOSIS — R531 Weakness: Secondary | ICD-10-CM | POA: Diagnosis present

## 2022-09-29 DIAGNOSIS — E119 Type 2 diabetes mellitus without complications: Secondary | ICD-10-CM | POA: Diagnosis not present

## 2022-09-29 DIAGNOSIS — D649 Anemia, unspecified: Secondary | ICD-10-CM | POA: Diagnosis not present

## 2022-09-29 DIAGNOSIS — I69398 Other sequelae of cerebral infarction: Secondary | ICD-10-CM | POA: Insufficient documentation

## 2022-09-29 DIAGNOSIS — I69351 Hemiplegia and hemiparesis following cerebral infarction affecting right dominant side: Secondary | ICD-10-CM | POA: Insufficient documentation

## 2022-09-29 DIAGNOSIS — Z7902 Long term (current) use of antithrombotics/antiplatelets: Secondary | ICD-10-CM | POA: Diagnosis not present

## 2022-09-29 LAB — CBC WITH DIFFERENTIAL/PLATELET
Abs Immature Granulocytes: 0.01 10*3/uL (ref 0.00–0.07)
Basophils Absolute: 0 10*3/uL (ref 0.0–0.1)
Basophils Relative: 1 %
Eosinophils Absolute: 0.1 10*3/uL (ref 0.0–0.5)
Eosinophils Relative: 3 %
HCT: 28.5 % — ABNORMAL LOW (ref 36.0–46.0)
Hemoglobin: 9.5 g/dL — ABNORMAL LOW (ref 12.0–15.0)
Immature Granulocytes: 0 %
Lymphocytes Relative: 42 %
Lymphs Abs: 1.8 10*3/uL (ref 0.7–4.0)
MCH: 27.9 pg (ref 26.0–34.0)
MCHC: 33.3 g/dL (ref 30.0–36.0)
MCV: 83.8 fL (ref 80.0–100.0)
Monocytes Absolute: 0.4 10*3/uL (ref 0.1–1.0)
Monocytes Relative: 11 %
Neutro Abs: 1.8 10*3/uL (ref 1.7–7.7)
Neutrophils Relative %: 43 %
Platelets: 264 10*3/uL (ref 150–400)
RBC: 3.4 MIL/uL — ABNORMAL LOW (ref 3.87–5.11)
RDW: 18 % — ABNORMAL HIGH (ref 11.5–15.5)
WBC: 4.1 10*3/uL (ref 4.0–10.5)
nRBC: 0 % (ref 0.0–0.2)

## 2022-09-29 LAB — COMPREHENSIVE METABOLIC PANEL
ALT: 10 U/L (ref 0–44)
AST: 22 U/L (ref 15–41)
Albumin: 3.5 g/dL (ref 3.5–5.0)
Alkaline Phosphatase: 42 U/L (ref 38–126)
Anion gap: 3 — ABNORMAL LOW (ref 5–15)
BUN: 17 mg/dL (ref 8–23)
CO2: 26 mmol/L (ref 22–32)
Calcium: 8.5 mg/dL — ABNORMAL LOW (ref 8.9–10.3)
Chloride: 105 mmol/L (ref 98–111)
Creatinine, Ser: 1.28 mg/dL — ABNORMAL HIGH (ref 0.44–1.00)
GFR, Estimated: 40 mL/min — ABNORMAL LOW (ref >60)
Glucose, Bld: 101 mg/dL — ABNORMAL HIGH (ref 70–99)
Potassium: 4.3 mmol/L (ref 3.5–5.1)
Sodium: 134 mmol/L — ABNORMAL LOW (ref 135–145)
Total Bilirubin: 0.7 mg/dL (ref 0.3–1.2)
Total Protein: 7.4 g/dL (ref 6.5–8.1)

## 2022-09-29 LAB — PROTIME-INR
INR: 1.2 (ref 0.8–1.2)
Prothrombin Time: 15.2 seconds (ref 11.4–15.2)

## 2022-09-29 LAB — APTT: aPTT: 28 seconds (ref 24–36)

## 2022-09-29 MED ORDER — ACETAMINOPHEN 325 MG PO TABS
650.0000 mg | ORAL_TABLET | Freq: Once | ORAL | Status: AC
  Administered 2022-09-29: 650 mg via ORAL
  Filled 2022-09-29: qty 2

## 2022-09-29 MED ORDER — CLONIDINE HCL 0.1 MG PO TABS
0.1000 mg | ORAL_TABLET | Freq: Once | ORAL | Status: AC
  Administered 2022-09-29: 0.1 mg via ORAL
  Filled 2022-09-29: qty 1

## 2022-09-29 NOTE — ED Notes (Signed)
Spoke with Delice Bison (grand daughter) to give an update with pt's approval. Grand daughter would like to be updated with any new information. Number listed below.  [phone]

## 2022-09-29 NOTE — ED Notes (Signed)
Granddaughter states that she has an eta of 40 minutes.

## 2022-09-29 NOTE — Discharge Instructions (Signed)
You were seen in the emergency department today for your weakness symptoms.  MRI shows no evidence of a stroke today.  The rest of your laboratory workup was otherwise reassuring.  Please follow-up with your primary care provider within 1 week for repeat evaluation.  You do have some chronic osteoarthritis on your right sided joints which may be contributing to your weakness.  I recommend taking 650 mg of Tylenol every 8 hours as needed.  Please return the emergency department you have any worsening symptoms or new falls.

## 2022-09-29 NOTE — ED Triage Notes (Signed)
Pt presents to the ED via [facility] from PCP. Pt lives at home alone and drove herself to the doctors office this morning, but was unable to get out of her car. Per EMS pt was unable to lift her right arm and was dragging her right leg. Pt does have a hx of a stroke. Pt states that these symptoms started last night around 2330. Pt was aphasic at PCP, but has returned to baseline at this time. MD at bedside.

## 2022-09-29 NOTE — ED Provider Notes (Signed)
[facility] Provider Note    Event Date/Time   First MD Initiated Contact with Patient 09/29/22 1106     (approximate)   History   Weakness   HPI Ellen [name] is a 89 y.o. female with prior CVA with residual right-sided deficits, DM2, HTN who presents today for weakness.  Patient lives alone and was coming to her PCP office today when they noticed her having difficulty getting out of the car.  She was unable to lift her right arm and supposedly dragging her left leg.  Patient states she has had symptoms with right arm and right leg pain which has made her weaker on that side for about 7 days.  She is noticed for the past 4 days some decrease sensation from her right knee downwards.  She also notes some decrease sensation to the left side of her face which is multiple days old.  Several days ago she felt like she had difficulty talking but that has resolved.  She has not noted any change in the symptoms in the past 24 hours.  Otherwise denies recent sick symptoms, chest pain, shortness of breath, abdominal pain, nausea.     Physical Exam   Triage Vital Signs: ED Triage Vitals  Encounter Vitals Group     BP 09/29/22 1113 (!) 171/63     Systolic BP Percentile --      Diastolic BP Percentile --      Pulse Rate 09/29/22 1113 62     Resp 09/29/22 1113 18     Temp 09/29/22 1113 98.2 F (36.8 C)     Temp Source 09/29/22 1113 Oral     SpO2 09/29/22 1108 96 %     Weight 09/29/22 1112 123 lb 14.4 oz (56.2 kg)     Height 09/29/22 1112 5\' 1"  (1.549 m)     Head Circumference --      Peak Flow --      Pain Score 09/29/22 1117 0     Pain Loc --      Pain Education --      Exclude from Growth Chart --     Most recent vital signs: Vitals:   09/29/22 1330 09/29/22 1425  BP: (!) 178/76 (!) 175/79  Pulse: 61 62  Resp: 18 10  Temp:    SpO2: 98% 100%   Physical Exam: I have reviewed the vital signs and nursing notes. General: Awake, alert, no acute distress.   Weak appearing. Head:  Atraumatic, normocephalic.   ENT:  EOM intact, PERRL. Oral mucosa is pink and moist with no lesions. Neck: Neck is supple with full range of motion, No meningeal signs. Cardiovascular:  RRR, No murmurs. Peripheral pulses palpable and equal bilaterally. Respiratory:  Symmetrical chest wall expansion.  No rhonchi, rales, or wheezes.  Good air movement throughout.  No use of accessory muscles.   Musculoskeletal:  No cyanosis or edema. Moving extremities with full ROM Abdomen:  Soft, nontender, nondistended. Neuro:  GCS 15, moving all four extremities, interacting appropriately.  Alert and oriented with cogent speech; 5/5 motor strength in left-sided extremities with intact peripheral nerve distributions; 5/5 strength in right side extremities however appears to be limited by pain.  Patient is able to hold her right arm off the bed but is having pain with rotation at the shoulder limiting full movement.  No drop or drift.  Right lower leg she is able to hold off the bed without dropping or drift but is limited to  pain at the knee.  Gross sensory intact to challenge in left-sided extremities and peripheral nerve distributions.  Notes diminished sensation below her right knee when compared to the left side.  Also noting sensation to left side of her face feels different/diminished than the right side; no clonus; cranial nerves 2-12 intact to gross challenge bilaterally aside from previously mentioned numbness on left side of face Psych:  Calm, appropriate.   Skin:  Warm, dry, no rash.     ED Results / Procedures / Treatments   Labs (all labs ordered are listed, but only abnormal results are displayed) Labs Reviewed  CBC WITH DIFFERENTIAL/PLATELET - Abnormal; Notable for the following components:      Result Value   RBC 3.40 (*)    Hemoglobin 9.5 (*)    HCT 28.5 (*)    RDW 18.0 (*)    All other components within normal limits  COMPREHENSIVE METABOLIC PANEL - Abnormal;  Notable for the following components:   Sodium 134 (*)    Glucose, Bld 101 (*)    Creatinine, Ser 1.28 (*)    Calcium 8.5 (*)    GFR, Estimated 40 (*)    Anion gap 3 (*)    All other components within normal limits  PROTIME-INR  APTT     EKG My EKG interpretation: Rate of 60, normal sinus rhythm, normal axis, normal intervals.  No acute ST elevations or depressions.   RADIOLOGY Independently interpreted CT head without acute abnormalities.  Dependently interpreted MRI with no acute pathology but do note a chronic old infarcts.   PROCEDURES:  Critical Care performed: No  Procedures   MEDICATIONS ORDERED IN ED: Medications  acetaminophen (TYLENOL) tablet 650 mg (650 mg Oral Given 09/29/22 1302)     IMPRESSION / MDM / ASSESSMENT AND PLAN / ED COURSE  I reviewed the triage vital signs and the nursing notes.                              Differential diagnosis includes, but is not limited to, CVA, dehydration, musculoskeletal pain leading to secondary weakness.  Patient's presentation is most consistent with severe exacerbation of chronic illness.  Patient is a 89 year old female presenting today for acute on chronic right-sided weakness.  On exam weakness seems limited secondary to pain at her right shoulder, right elbow, and right knee.  She is able to hold both these extremities off the bed to gravity with no drift but limited in range of motion secondary to pain at the joints.  She has some paresthesia noted to her left lower leg but no overt numbness.  Given concerns for possible stroke, initial CT head without contrast was ordered.  Patient while outside the 4.5-hour window.  CT head unremarkable.  Discussed case with neurology who recommends MRI.  MRI shows no evidence of a CVA.  Laboratory workup otherwise reassuring at this time.  I suspect her weakness symptoms are more secondary to her acute on chronic osteoarthritis symptoms.  Updated patient on results of these  findings and she was politely asking for discharge at this time.  I do feel comfortable with discharge at this time with otherwise stable vital signs and exam.  Patient was told to follow-up with her PCP within a week and given strict return precautions.  The patient is on the cardiac monitor to evaluate for evidence of arrhythmia and/or significant heart rate changes. Clinical Course as of 09/29/22 1514  Wed  Sep 29, 2022  1133 CBC with Differential(!) Mild anemia largely consistent with her baseline [DW]  1146 Comprehensive metabolic panel(!) Largely unremarkable [DW]  1213 X-ray findings of right shoulder, right elbow, and right knee show chronic degenerative changes per my interpretation.  Concern for rotator cuff chronic injury of the right shoulder. [DW]  1220 CT Head Wo Contrast No acute abnormalities noted [DW]  1224 Reassessed patient.  Notes ongoing right-sided weakness more so in comparison to left.  Originally went to her doctor today because she started noticing some worsening numbness in her right lower leg as well as around her mouth that she reports may have started several days ago but also noticed a worsening around 11 PM last night [DW]  1255 Spoke with neurologist on the phone.  States that we should get MRI.  If that is normal then low suspicion that symptoms today are related to stroke. [DW]  1510 MR BRAIN WO CONTRAST No evidence of acute CVA [DW]  1511 Reassessed patient.  She is feeling at her baseline at this time.  She was asking be discharged.  With otherwise reassuring workup and that she is able to ambulate, we will discharge her with outpatient follow-up. [DW]    Clinical Course User Index [DW] Janith Lima, MD     FINAL CLINICAL IMPRESSION(S) / ED DIAGNOSES   Final diagnoses:  Right sided weakness  CVA, old, alterations of sensations     Rx / DC Orders   ED Discharge Orders     None        Note:  This document was prepared using Dragon voice  recognition software and may include unintentional dictation errors.   Janith Lima, MD 09/29/22 1520

## 2022-09-29 NOTE — ED Notes (Signed)
Pt verbalizes understanding of discharge instructions. Opportunity for questioning and answers were provided. Pt discharged from ED to home with family. ED provider aware of pt's BP on discharge. Pt is going to take her home BP medication when she get's home.

## 2022-09-29 NOTE — ED Notes (Signed)
MRI called to screen patient

## 2022-10-13 ENCOUNTER — Other Ambulatory Visit: Payer: Self-pay

## 2022-10-14 NOTE — Patient Instructions (Signed)
Visit Information  Thank you for taking time to visit with me today. Please don't hesitate to contact me if I can be of assistance to you before our next scheduled telephone appointment.  Following are the goals we discussed today:   Goals Addressed             This Visit's Progress    RNCM Care Management Expected Outcome:  Monitor, Self-Manage and Reduce Symptoms of: History of Stroke       Current Barriers:  Knowledge Deficits related to the importance of seeking early help for sx and sx of heart attack and stroke. The patient is doing well post stroke.  Care Coordination needs related to resources in the home for home health in a patient with post CVA/TIA and post discharge from the hospital on 02-01-2022 Chronic Disease Management support and education needs related to effective management of post CVA/TIA and other chronic conditions  Non-adherence to scheduled provider appointments  Planned Interventions: Evaluation of current treatment plan related to CVA/TIA and post discharge needs related to CVA/TIA and patient's adherence to plan as established by provider. Per the patients daughter the patient is doing very well. She says for her mother to be 20 it is like she has not had a stroke at all. She is very active and is wanting the weather to stay warmer so she can go fishing. No new concerns related to CVA/TIA. Ellen [name] called the RNCM due to wanting to ask about the recommendations from the pcp about seeing if someone could come in and help the patient and take her to appointments. Discussed in length that the patient was independent and likely would not agree to this. The patient is driving and the daughter does not want her driving because she is getting lost. The daughter does have life 360 on the patients phone so she was able to find her all the times she got lost but she is concerned about this. Discussed having a family meeting and discussing with her siblings. Also advised her to put  up cameras in the patients home to have an added way of checking on the patient. The patient is very independent and normally self sufficient. Advised the daughter that the [facility] would talk with the pcp and get back with her with his recommendations. Advised patient to keep appointments with pcp, write down questions and concerns to ask the pcp, and review of available resources to the patient. Spoke to the patients daughter, Ellen [name] who is also DRP.  Provided education to patient re: the goals of the CCM team, available disciplines and resources, and help with needed support for the patient in the home setting post hospitalization. The patient is very independent. The patient has family members that have been checking on her daily. She continues to live alone. Ellen [name] states the patient is doing well and has not new concerns. Review of how to get in touch with the [facility] for any new needs between outreaches.  Reviewed medications with patient and discussed compliance. States compliance with medications. The patient is using a medication pill box for her medications. Is stable with her medications and is taking as directed. Her daughter Ellen [name] keeps a close watch on her medications and the patient.  Provided patient with CVA educational materials related to warning sx and sx of new onset of CVA Reviewed scheduled/upcoming provider appointments including: The office to call and get an appointment for follow up due to new sx and sx the patient is having.  Discussed plans with patient for ongoing care management follow up and provided patient with direct contact information for care management team Advised patient to discuss questions, concerns, needs of the patient, and ongoing support for effective management of chronic conditions with provider Screening for signs and symptoms of depression related to chronic disease state  Assessed social determinant of health barriers The patient continues to work with home  health in her home. Denies any new changes or needs.  Review of safety in the home. The patient has a life alert system in place. The patient did call her daughter when she felt numbness in her hand, arm, and leg. Will continue to monitor for changes, needs, or concerns. The patients daughter has the [facility] number to call for assistance or new needs. Review of safety due to the patient with light headedness and dizziness. Discussed with the daughter today about discussing the patient moving closer to her so that she would be available to see her more often and check on her. The patient does not want to give up her garden or her dog. Reflective listening and support given.    Symptom Management: Take medications as prescribed   Attend all scheduled provider appointments Call provider office for new concerns or questions  call the Suicide and Crisis Lifeline: 988 call the [place] National Suicide Prevention Lifeline: 660-754-0107 or TTY: 229-842-9259 TTY (506)225-3300) to talk to a trained counselor call 1-800-273-TALK (toll free, 24 hour hotline) if experiencing a Mental Health or Behavioral Health Crisis   Follow Up Plan: Telephone follow up appointment with care management team member scheduled for: 11-18-2022 at 230 pm       RNCM Care Management Expected Outcome:  Monitor, Self-Manage, and Reduce Symptoms of Hypertension       Current Barriers:  Knowledge Deficits related to to the benefits of checking blood pressures and monitoring for changes in HTN and heart health  Care Coordination needs related to support and education post hospitalization  in a patient with HTN and other chronic conditions Chronic Disease Management support and education needs related to effective management of HTN BP Readings from Last 3 Encounters:  09/29/22 (!) 217/75  07/27/22 (!) 176/78  06/29/22 130/62     Planned Interventions: Evaluation of current treatment plan related to hypertension self management and  patient's adherence to plan as established by provider. The patients blood pressures are stable. She is doing well over all. The patient continues to have elevated blood pressures. Education given to the patients daughter.  Provided education to patient re: stroke prevention, s/s of heart attack and stroke. The patient has a history of stroke. Is doing well overall. Is driving and active. Wants to go fishing as this is something she loves to do. The patient has a history of strokes. Education that if she is having a headache she may be experiencing high blood pressures. The patients daughter states that she does not have a blood pressure cuff but she has 2 at her  house and she is taking one to her on Wednesday. Review of taking blood pressures when she has these episodes.  Reviewed prescribed diet heart healthy/ADA diet. Education and support given Reviewed medications with patient and discussed importance of compliance. Endorses compliance with medications. The patient has her medications and is compliant with medications. Her daughter Ellen [name] assist with any new needs related to medications. Will continue to monitor. Did discuss the medication addition of Mirtazapine to her medication regimen. Will continue to monitor for changes or  new needs.  Discussed plans with patient for ongoing care management follow up and provided patient with direct contact information for care management team; Advised patient, providing education and rationale, to monitor blood pressure daily and record, calling PCP for findings outside established parameters. Denies any new needs with her blood pressures.  Reviewed scheduled/upcoming provider appointments including: Aministrative staff to call and get an appointment with the pcp after Wednesday due to some new sx and sx she is having.  Website: [email or web address]  for setting up an appointment for her to have her vision checked and she also has the number to [facility]  to follow up for a referral that was placed to day for follow up of shoulder pain. The daughter knows to call the [facility] for new questions or concerns.  Advised patient to discuss changes in blood pressures or heart health with provider; Provided education on prescribed diet heart healthy/ADA diet ;  Discussed complications of poorly controlled blood pressure such as heart disease, stroke, circulatory complications, vision complications, kidney impairment, sexual dysfunction;  Screening for signs and symptoms of depression related to chronic disease state;  Assessed social determinant of health barriers;   Symptom Management: Take medications as prescribed   Attend all scheduled provider appointments Call provider office for new concerns or questions  call the Suicide and Crisis Lifeline: 988 call the [place] National Suicide Prevention Lifeline: 202-136-3434 or TTY: 6508323715 TTY 5078390942) to talk to a trained counselor call 1-800-273-TALK (toll free, 24 hour hotline) if experiencing a Mental Health or Behavioral Health Crisis  check blood pressure 3 times per week write blood pressure results in a log or diary learn about high blood pressure keep a blood pressure log take blood pressure log to all doctor appointments call doctor for signs and symptoms of high blood pressure develop an action plan for high blood pressure keep all doctor appointments take medications for blood pressure exactly as prescribed report new symptoms to your doctor  Follow Up Plan: Telephone follow up appointment with care management team member scheduled for: 11-18-2022 at 230 pm           Our next appointment is by telephone on 11-18-2022 at 230 pm  Please call the care guide team at [phone] if you need to cancel or reschedule your appointment.   If you are experiencing a Mental Health or Behavioral Health Crisis or need someone to talk to, please call the Suicide and Crisis Lifeline:  988 call the [place] National Suicide Prevention Lifeline: 514 676 3667 or TTY: 201-151-9322 TTY 860-461-3254) to talk to a trained counselor call 1-800-273-TALK (toll free, 24 hour hotline)   Patient verbalizes understanding of instructions and care plan provided today and agrees to view in MyChart. Active MyChart status and patient understanding of how to access instructions and care plan via MyChart confirmed with patient.     Telephone follow up appointment with care management team member scheduled for: 11-18-2022 at 230 pm  Alto Denver RN, MSN, CCM RN Care Manager  [facility]  Ambulatory Care Management  Direct Number: [phone]

## 2022-10-14 NOTE — Patient Outreach (Signed)
Care Management   Visit Note  10/14/2022 Name: Ellen [name] MRN: [id number] DOB: [birth year]  Subjective: Ellen [name] is a 89 y.o. year old female who is a primary care patient of Smitty Cords, DO. The Care Management team was consulted for assistance.      Engaged with patient spoke with the family member (POA, [place], [place]).    Goals Addressed             This Visit's Progress    RNCM Care Management Expected Outcome:  Monitor, Self-Manage and Reduce Symptoms of: History of Stroke       Current Barriers:  Knowledge Deficits related to the importance of seeking early help for sx and sx of heart attack and stroke. The patient is doing well post stroke.  Care Coordination needs related to resources in the home for home health in a patient with post CVA/TIA and post discharge from the hospital on 02-01-2022 Chronic Disease Management support and education needs related to effective management of post CVA/TIA and other chronic conditions  Non-adherence to scheduled provider appointments  Planned Interventions: Evaluation of current treatment plan related to CVA/TIA and post discharge needs related to CVA/TIA and patient's adherence to plan as established by provider. Per the patients daughter the patient is doing very well. She says for her mother to be 68 it is like she has not had a stroke at all. She is very active and is wanting the weather to stay warmer so she can go fishing. No new concerns related to CVA/TIA. Ellen [name] called the RNCM due to wanting to ask about the recommendations from the pcp about seeing if someone could come in and help the patient and take her to appointments. Discussed in length that the patient was independent and likely would not agree to this. The patient is driving and the daughter does not want her driving because she is getting lost. The daughter does have life 360 on the patients phone so she was able to find her all the times she got lost but  she is concerned about this. Discussed having a family meeting and discussing with her siblings. Also advised her to put up cameras in the patients home to have an added way of checking on the patient. The patient is very independent and normally self sufficient. Advised the daughter that the [facility] would talk with the pcp and get back with her with his recommendations. Advised patient to keep appointments with pcp, write down questions and concerns to ask the pcp, and review of available resources to the patient. Spoke to the patients daughter, Ellen [name] who is also DRP.  Provided education to patient re: the goals of the CCM team, available disciplines and resources, and help with needed support for the patient in the home setting post hospitalization. The patient is very independent. The patient has family members that have been checking on her daily. She continues to live alone. Ellen [name] states the patient is doing well and has not new concerns. Review of how to get in touch with the [facility] for any new needs between outreaches.  Reviewed medications with patient and discussed compliance. States compliance with medications. The patient is using a medication pill box for her medications. Is stable with her medications and is taking as directed. Her daughter Ellen [name] keeps a close watch on her medications and the patient.  Provided patient with CVA educational materials related to warning sx and sx of new onset of CVA Reviewed scheduled/upcoming provider appointments including: The  office to call and get an appointment for follow up due to new sx and sx the patient is having.  Discussed plans with patient for ongoing care management follow up and provided patient with direct contact information for care management team Advised patient to discuss questions, concerns, needs of the patient, and ongoing support for effective management of chronic conditions with provider Screening for signs and symptoms of depression related  to chronic disease state  Assessed social determinant of health barriers The patient continues to work with home health in her home. Denies any new changes or needs.  Review of safety in the home. The patient has a life alert system in place. The patient did call her daughter when she felt numbness in her hand, arm, and leg. Will continue to monitor for changes, needs, or concerns. The patients daughter has the [facility] number to call for assistance or new needs. Review of safety due to the patient with light headedness and dizziness. Discussed with the daughter today about discussing the patient moving closer to her so that she would be available to see her more often and check on her. The patient does not want to give up her garden or her dog. Reflective listening and support given.    Symptom Management: Take medications as prescribed   Attend all scheduled provider appointments Call provider office for new concerns or questions  call the Suicide and Crisis Lifeline: 988 call the [place] National Suicide Prevention Lifeline: (816)575-7833 or TTY: 412-024-1293 TTY (910) 754-6176) to talk to a trained counselor call 1-800-273-TALK (toll free, 24 hour hotline) if experiencing a Mental Health or Behavioral Health Crisis   Follow Up Plan: Telephone follow up appointment with care management team member scheduled for: 11-18-2022 at 230 pm       RNCM Care Management Expected Outcome:  Monitor, Self-Manage, and Reduce Symptoms of Hypertension       Current Barriers:  Knowledge Deficits related to to the benefits of checking blood pressures and monitoring for changes in HTN and heart health  Care Coordination needs related to support and education post hospitalization  in a patient with HTN and other chronic conditions Chronic Disease Management support and education needs related to effective management of HTN BP Readings from Last 3 Encounters:  09/29/22 (!) 217/75  07/27/22 (!) 176/78  06/29/22 130/62      Planned Interventions: Evaluation of current treatment plan related to hypertension self management and patient's adherence to plan as established by provider. The patients blood pressures are stable. She is doing well over all. The patient continues to have elevated blood pressures. Education given to the patients daughter.  Provided education to patient re: stroke prevention, s/s of heart attack and stroke. The patient has a history of stroke. Is doing well overall. Is driving and active. Wants to go fishing as this is something she loves to do. The patient has a history of strokes. Education that if she is having a headache she may be experiencing high blood pressures. The patients daughter states that she does not have a blood pressure cuff but she has 2 at her  house and she is taking one to her on Wednesday. Review of taking blood pressures when she has these episodes.  Reviewed prescribed diet heart healthy/ADA diet. Education and support given Reviewed medications with patient and discussed importance of compliance. Endorses compliance with medications. The patient has her medications and is compliant with medications. Her daughter Ellen [name] assist with any new needs related to medications. Will  continue to monitor. Did discuss the medication addition of Mirtazapine to her medication regimen. Will continue to monitor for changes or new needs.  Discussed plans with patient for ongoing care management follow up and provided patient with direct contact information for care management team; Advised patient, providing education and rationale, to monitor blood pressure daily and record, calling PCP for findings outside established parameters. Denies any new needs with her blood pressures.  Reviewed scheduled/upcoming provider appointments including: Aministrative staff to call and get an appointment with the pcp after Wednesday due to some new sx and sx she is having.  Website:  [email or web address]  for setting up an appointment for her to have her vision checked and she also has the number to [facility] to follow up for a referral that was placed to day for follow up of shoulder pain. The daughter knows to call the [facility] for new questions or concerns.  Advised patient to discuss changes in blood pressures or heart health with provider; Provided education on prescribed diet heart healthy/ADA diet ;  Discussed complications of poorly controlled blood pressure such as heart disease, stroke, circulatory complications, vision complications, kidney impairment, sexual dysfunction;  Screening for signs and symptoms of depression related to chronic disease state;  Assessed social determinant of health barriers;   Symptom Management: Take medications as prescribed   Attend all scheduled provider appointments Call provider office for new concerns or questions  call the Suicide and Crisis Lifeline: 988 call the [place] National Suicide Prevention Lifeline: 4846600755 or TTY: 517-435-4754 TTY 702 642 8298) to talk to a trained counselor call 1-800-273-TALK (toll free, 24 hour hotline) if experiencing a Mental Health or Behavioral Health Crisis  check blood pressure 3 times per week write blood pressure results in a log or diary learn about high blood pressure keep a blood pressure log take blood pressure log to all doctor appointments call doctor for signs and symptoms of high blood pressure develop an action plan for high blood pressure keep all doctor appointments take medications for blood pressure exactly as prescribed report new symptoms to your doctor  Follow Up Plan: Telephone follow up appointment with care management team member scheduled for: 11-18-2022 at 230 pm            Consent to Services:  Patient was given information about care management services, agreed to services, and gave verbal consent to participate.   Plan: Telephone follow up  appointment with care management team member scheduled for: 11-18-2022 at 230 pm  Alto Denver RN, MSN, CCM RN Care Manager  [facility]  Ambulatory Care Management  Direct Number: [phone]

## 2022-11-18 ENCOUNTER — Telehealth: Payer: Self-pay

## 2022-11-18 ENCOUNTER — Other Ambulatory Visit: Payer: Self-pay

## 2022-11-18 NOTE — Patient Outreach (Signed)
Care Management   Follow Up Note   11/18/2022 Name: Ellen [name] MRN: [id number] DOB: [birth year]   Referred by: Smitty Cords, DO Reason for referral : Care Management (RNCM: Follow up for Chronic Disease Management and Care Coordination Needs-attempt)   An unsuccessful telephone outreach was attempted today. The patient was referred to the case management team for assistance with care management and care coordination.   Follow Up Plan: A HIPPA compliant phone message was left for the patient providing contact information and requesting a return call.   Alto Denver RN, MSN, CCM RN Care Manager  [facility]  Ambulatory Care Management  Direct Number: [phone]

## 2022-12-10 ENCOUNTER — Other Ambulatory Visit: Payer: Self-pay

## 2022-12-10 ENCOUNTER — Other Ambulatory Visit: Payer: Self-pay | Admitting: *Deleted

## 2022-12-10 NOTE — Patient Instructions (Signed)
Visit Information  Thank you for taking time to visit with me today. Please don't hesitate to contact me if I can be of assistance to you before our next scheduled telephone appointment.  Following are the goals we discussed today:   Goals Addressed             This Visit's Progress    RNCM Care Management Expected Outcome:  Monitor, Self-Manage and Reduce Symptoms of: History of Stroke       Current Barriers:  Knowledge Deficits related to the importance of seeking early help for sx and sx of heart attack and stroke. The patient is doing well post stroke.  Care Coordination needs related to resources in the home for home health in a patient with post CVA/TIA and post discharge from the hospital on 02-01-2022 Chronic Disease Management support and education needs related to effective management of post CVA/TIA and other chronic conditions  Non-adherence to scheduled provider appointments  Planned Interventions: Evaluation of current treatment plan related to CVA/TIA and post discharge needs related to CVA/TIA and patient's adherence to plan as established by provider. Per the patients daughter the patient is doing very well. Patient continues to have cognitive decline and has follow up PCP for evaluation. Advised patient to keep appointments with pcp, write down questions and concerns to ask the pcp, and review of available resources to the patient. Spoke to the patients daughter, [name] who is also DRP.  Provided education to patient re: the goals of the CCM team, available disciplines and resources, and help with needed support for the patient in the home setting post hospitalization. The patient is very independent. The patient has family members that have been checking on her daily. She continues to live alone. [name] states the patient is doing well and has not new concerns. Review of how to get in touch with the [facility] for any new needs between outreaches.  Reviewed medications with patient  and discussed compliance. States compliance with medications. The patient is using a medication pill box for her medications. Is stable with her medications and is taking as directed. Her daughter [name] keeps a close watch on her medications and the patient.  Provided patient with CVA educational materials related to warning sx and sx of new onset of CVA Reviewed scheduled/upcoming provider appointments including: The office to call and get an appointment for follow up due to new sx and sx the patient is having.  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 Denies any new changes or needs.  Review of safety in the home. The patient has a life alert system in place. The patient did call her daughter when she felt numbness in her hand, arm, and leg. Will continue to monitor for changes, needs, or concerns. The patients daughter has the [facility] number to call for assistance or new needs. Review of safety due to the patient with light headedness and dizziness. Discussed with the daughter today about discussing the patient moving closer to her so that she would be available to see her more often and check on her. The patient does not want to give  up her garden or her dog. Reflective listening and support given.    Symptom Management: Take medications as prescribed   Attend all scheduled provider appointments Call provider office for new concerns or questions  call the Suicide and Crisis Lifeline: 988 call the [place] National Suicide Prevention Lifeline: 9415020882 or TTY:  737-583-9155 TTY 910-697-5840) to talk to a trained counselor call 1-800-273-TALK (toll free, 24 hour hotline) if experiencing a Mental Health or Behavioral Health Crisis   Follow Up Plan: Telephone follow up appointment with care management team member scheduled for: 01-28-2023 at 10:30 am       [facility] Care Management Expected Outcome:  Monitor, Self-Manage, and Reduce Symptoms of Hypertension       Current Barriers:  Knowledge Deficits related to to the benefits of checking blood pressures and monitoring for changes in HTN and heart health  Care Coordination needs related to support and education post hospitalization  in a patient with HTN and other chronic conditions Chronic Disease Management support and education needs related to effective management of HTN BP Readings from Last 3 Encounters:  09/29/22 (!) 217/75  07/27/22 (!) 176/78  06/29/22 130/62     Planned Interventions: Evaluation of current treatment plan related to hypertension self management and patient's adherence to plan as established by provider. The patients blood pressures are stable. She is doing well over all. The patient continues to have elevated blood pressures. Education given to the patients daughter.  Provided education to patient re: stroke prevention, s/s of heart attack and stroke. The patient has a history of stroke. Is doing well overall. Is driving and active. Recently went fishing recently and she really enjoyed this. Reviewed prescribed diet heart healthy/ADA diet. Education and support given Reviewed medications with patient and discussed importance of compliance. [name] reports that patient often forgets to take her medications. Per [name], patient memory continues to decline and has follow up appointment for evaluation.   Discussed plans with patient for ongoing care management follow up and provided patient with direct contact information for care management team; Advised patient, providing education and  rationale, to monitor blood pressure daily and record, calling PCP for findings outside established parameters. Denies any new needs with her blood pressures.  Reviewed scheduled/upcoming provider appointments including:  Advised patient to discuss changes in blood pressures or heart health with provider; Provided education on prescribed diet heart healthy/ADA diet ;  Discussed complications of poorly controlled blood pressure such as heart disease, stroke, circulatory complications, vision complications, kidney impairment, sexual dysfunction;  Screening for signs and symptoms of depression related to chronic disease state;  Assessed social determinant of health barriers;  Patient is currently in [place] taking care of her 5 yo sister.  Symptom Management: Take medications as prescribed   Attend all scheduled provider appointments Call provider office for new concerns or questions  call the Suicide and Crisis Lifeline: 988 call the [place] National Suicide Prevention Lifeline: (450)222-1082 or TTY: 719-453-5845 TTY 620-815-7735) to talk to a trained counselor call 1-800-273-TALK (toll free, 24 hour hotline) if experiencing a Mental Health or Behavioral Health Crisis  check blood pressure 3 times per week write blood pressure results in a log or diary learn about high blood pressure keep a blood pressure log take blood pressure log to all doctor appointments call doctor for signs and symptoms of high blood pressure develop an action plan for high blood pressure keep all doctor appointments take medications for blood pressure exactly as prescribed report new symptoms to your  doctor  Follow Up Plan: Telephone follow up appointment with care management team member scheduled for: 01-28-2023 at 10:30 am           Our next appointment is by telephone on 01-28-2023 at 10:30 am  Please call the care guide team at [phone] if you need to cancel or reschedule your appointment.   If you are  experiencing a Mental Health or Behavioral Health Crisis or need someone to talk to, please call the Suicide and Crisis Lifeline: 988 call the [place] National Suicide Prevention Lifeline: 989 513 9327 or TTY: 502-226-6894 TTY (985) 757-1070) to talk to a trained counselor call 1-800-273-TALK (toll free, 24 hour hotline) call 911   Patient verbalizes understanding of instructions and care plan provided today and agrees to view in MyChart. Active MyChart status and patient understanding of how to access instructions and care plan via MyChart confirmed with patient.     Telephone follow up appointment with care management team member scheduled for: 01-28-2023 at 10:30 am  Danise Edge, BSN RN RN Care Manager  [facility]  Ambulatory Care Management  Direct Number: [phone]

## 2022-12-10 NOTE — Patient Outreach (Signed)
Care Management   Visit Note  12/10/2022 Name: Ellen [name] MRN: [id number] DOB: [birth year]  Subjective: Ellen [name] is a 89 y.o. year old female who is a primary care patient of Smitty Cords, DO. The Care Management team was consulted for assistance.      Engaged with patient spoke with patient by telephone.    Goals Addressed             This Visit's Progress    RNCM Care Management Expected Outcome:  Monitor, Self-Manage and Reduce Symptoms of: History of Stroke       Current Barriers:  Knowledge Deficits related to the importance of seeking early help for sx and sx of heart attack and stroke. The patient is doing well post stroke.  Care Coordination needs related to resources in the home for home health in a patient with post CVA/TIA and post discharge from the hospital on 02-01-2022 Chronic Disease Management support and education needs related to effective management of post CVA/TIA and other chronic conditions  Non-adherence to scheduled provider appointments  Planned Interventions: Evaluation of current treatment plan related to CVA/TIA and post discharge needs related to CVA/TIA and patient's adherence to plan as established by provider. Per the patients daughter the patient is doing very well. Patient continues to have cognitive decline and has follow up PCP for evaluation. Advised patient to keep appointments with pcp, write down questions and concerns to ask the pcp, and review of available resources to the patient. Spoke to the patients daughter, Ellen [name] who is also DRP.  Provided education to patient re: the goals of the CCM team, available disciplines and resources, and help with needed support for the patient in the home setting post hospitalization. The patient is very independent. The patient has family members that have been checking on her daily. She continues to live alone. [name] states the patient is doing well and has not new concerns. Review of how to  get in touch with the [facility] for any new needs between outreaches.  Reviewed medications with patient and discussed compliance. States compliance with medications. The patient is using a medication pill box for her medications. Is stable with her medications and is taking as directed. Her daughter Ellen [name] keeps a close watch on her medications and the patient.  Provided patient with CVA educational materials related to warning sx and sx of new onset of CVA Reviewed scheduled/upcoming provider appointments including: The office to call and get an appointment for follow up due to new sx and sx the patient is having.  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 Denies any new changes or needs.  Review of safety in the home. The patient has a life alert system in place. The patient did call her daughter when she felt numbness in her hand, arm, and leg. Will continue to monitor for changes, needs, or concerns. The patients daughter has the [facility] number to call for assistance or new needs. Review of safety due to the patient with light headedness and dizziness. Discussed with the daughter today about discussing the patient moving closer to her so that she would  be available to see her more often and check on her. The patient does not want to give up her garden or her dog. Reflective listening and support given.    Symptom Management: Take medications as prescribed   Attend all scheduled provider appointments Call provider office for new concerns or questions  call the Suicide and  Crisis Lifeline: 988 call the [place] National Suicide Prevention Lifeline: 309-662-9220 or TTY: (786) 462-5921 TTY (380)791-5223) to talk to a trained counselor call 1-800-273-TALK (toll free, 24 hour hotline) if experiencing a Mental Health or Behavioral Health Crisis   Follow Up Plan: Telephone follow up appointment with care management team member scheduled for: 01-28-2023 at 10:30 am       [facility] Care Management Expected Outcome:  Monitor, Self-Manage, and Reduce Symptoms of Hypertension       Current Barriers:  Knowledge Deficits related to to the benefits of checking blood pressures and monitoring for changes in HTN and heart health  Care Coordination needs related to support and education post hospitalization  in a patient with HTN and other chronic conditions Chronic Disease Management support and education needs related to effective management of HTN BP Readings from Last 3 Encounters:  09/29/22 (!) 217/75  07/27/22 (!) 176/78  06/29/22 130/62     Planned Interventions: Evaluation of current treatment plan related to hypertension self management and patient's adherence to plan as established by provider. The patients blood pressures are stable. She is doing well over all. The patient continues to have elevated blood pressures. Education given to the patients daughter.  Provided education to patient re: stroke prevention, s/s of heart attack and stroke. The patient has a history of stroke. Is doing well overall. Is driving and active. Recently went fishing recently and she really enjoyed this. Reviewed prescribed diet heart healthy/ADA diet. Education and support given Reviewed medications with patient and discussed importance of compliance. [name] reports that patient often forgets to take her medications. Per [name], patient memory continues to decline and has follow up appointment for evaluation.   Discussed plans with patient for ongoing care management follow up and provided patient  with direct contact information for care management team; Advised patient, providing education and rationale, to monitor blood pressure daily and record, calling PCP for findings outside established parameters. Denies any new needs with her blood pressures.  Reviewed scheduled/upcoming provider appointments including:  Advised patient to discuss changes in blood pressures or heart health with provider; Provided education on prescribed diet heart healthy/ADA diet ;  Discussed complications of poorly controlled blood pressure such as heart disease, stroke, circulatory complications, vision complications, kidney impairment, sexual dysfunction;  Screening for signs and symptoms of depression related to chronic disease state;  Assessed social determinant of health barriers;  Patient is currently in [place] taking care of her 53 yo sister.  Symptom Management: Take medications as prescribed   Attend all scheduled provider appointments Call provider office for new concerns or questions  call the Suicide and Crisis Lifeline: 988 call the [place] National Suicide Prevention Lifeline: 432-207-3027 or TTY: (507)617-8101 TTY 8653641809) to talk to a trained counselor call 1-800-273-TALK (toll free, 24 hour hotline) if experiencing a Mental Health or Behavioral Health Crisis  check blood pressure 3 times per week write blood pressure results in a log or diary learn about high blood pressure keep a blood pressure log take blood pressure log to all doctor appointments call doctor for signs and symptoms of high blood pressure develop an action plan for high blood  pressure keep all doctor appointments take medications for blood pressure exactly as prescribed report new symptoms to your doctor  Follow Up Plan: Telephone follow up appointment with care management team member scheduled for: 01-28-2023 at 10:30 am              Consent to Services:  Patient was given information about care management  services, agreed to services, and gave verbal consent to participate.   Plan: Telephone follow up appointment with care management team member scheduled for: 01-28-2023 at 10:30 am  Danise Edge, BSN RN RN Care Manager  [facility]  Ambulatory Care Management  Direct Number: [phone]

## 2022-12-15 ENCOUNTER — Encounter: Payer: Self-pay | Admitting: Family Medicine

## 2022-12-15 ENCOUNTER — Ambulatory Visit: Payer: Medicare Other | Admitting: Family Medicine

## 2022-12-15 VITALS — BP 154/70 | Ht 61.0 in | Wt 121.0 lb

## 2022-12-15 DIAGNOSIS — M12811 Other specific arthropathies, not elsewhere classified, right shoulder: Secondary | ICD-10-CM

## 2022-12-15 DIAGNOSIS — M25511 Pain in right shoulder: Secondary | ICD-10-CM | POA: Diagnosis not present

## 2022-12-15 DIAGNOSIS — R630 Anorexia: Secondary | ICD-10-CM

## 2022-12-15 DIAGNOSIS — G8929 Other chronic pain: Secondary | ICD-10-CM | POA: Diagnosis not present

## 2022-12-15 DIAGNOSIS — R519 Headache, unspecified: Secondary | ICD-10-CM | POA: Diagnosis not present

## 2022-12-15 DIAGNOSIS — I10 Essential (primary) hypertension: Secondary | ICD-10-CM | POA: Diagnosis not present

## 2022-12-15 MED ORDER — LIDOCAINE HCL (PF) 1 % IJ SOLN
4.0000 mL | Freq: Once | INTRAMUSCULAR | Status: AC
Start: 2022-12-15 — End: 2022-12-15
  Administered 2022-12-15: 4 mL

## 2022-12-15 MED ORDER — METHYLPREDNISOLONE ACETATE 40 MG/ML IJ SUSP
40.0000 mg | Freq: Once | INTRAMUSCULAR | Status: AC
Start: 2022-12-15 — End: 2022-12-15
  Administered 2022-12-15: 40 mg via INTRA_ARTICULAR

## 2022-12-15 MED ORDER — MIRTAZAPINE 15 MG PO TABS
15.0000 mg | ORAL_TABLET | Freq: Every day | ORAL | 3 refills | Status: DC
Start: 2022-12-15 — End: 2023-08-02

## 2022-12-15 MED ORDER — AMLODIPINE BESYLATE 10 MG PO TABS: 10.0000 mg | ORAL_TABLET | Freq: Every day | ORAL | 3 refills | Status: DC

## 2022-12-15 NOTE — Progress Notes (Signed)
Subjective:    Patient ID: Ellen [name], female    DOB: 10-23-1929, 89 y.o.   MRN: [id number]  Ellen [name] is a 89 y.o. female presenting on 12/15/2022 for Memory Loss   HPI  Discussed the use of AI scribe software for clinical note transcription with the patient, who gave verbal consent to proceed.  History provided by patient and daughter Ellen [name].  The patient, a 79 year old with a history of hypertension, presented with concerns of persistent dizziness, uncontrolled blood pressure, and recent episodes of confusion leading to getting lost while driving. The patient reported taking three medications daily, including one for blood pressure, a cholesterol medication, and a blood thinner, she does not have medicines with her today. The patient also reported a significant weight loss, dropping from 170 pounds to her current weight 120 lbs.  The patient described a constant feeling of dizziness, particularly when changing positions, such as standing up. This dizziness was associated with a sensation of falling if not holding onto something for support. The patient also reported a persistent headache, which was attributed to high blood pressure.  In addition to these symptoms, the patient reported episodes of confusion and getting lost while driving, which had occurred twice in recent weeks.   The patient also reported pain and limited range of motion in the right shoulder, which was higher than the left. The patient described a numbness extending from the shoulder down to the big toe. The patient also reported lumps in the fingers, which were identified as arthritis.  The patient's medications included a blood pressure medication, a cholesterol medication, and a blood thinner. The patient reported taking these medications daily. The patient had previously been on amlodipine for blood pressure, but this was no longer on her medication list.  The patient's family reported concerns about the  patient's memory, noting increased confusion and recent episodes where the patient got lost while driving. The patient had a history of normal cognitive assessments, but the family reported a noticeable decline in recent months.  The patient's weight loss was also a concern. The patient reported a significant drop in weight, from 170 pounds to her current weight. The patient had not been eating much and reported no appetite. The patient had previously been on mirtazapine, a medication that can stimulate appetite and aid in sleep, but this had run out. - Needs new order on Mirtazapine - Eating 1-2 meals per day  The patient's blood pressure was noted to be high at 170/68. Despite taking medication for hypertension, the patient's blood pressure remained uncontrolled. The patient reported taking her medications as prescribed. - She is no longer taking Amlodipine 10mg , as medication was discontinued by hospital previously and ultimately rx ran out, did not realize she was not taking it.  Chronic R shoulder injury with AC joint History of R Rotator Cuff Impingement Prior injection 2020 Associated numbness R side May need Orthopedic again, previously at [facility] clinic in 2023   History of CVA with residual deficit No longer taking Aspirin 81 OTC, ran out      12/15/2022   10:49 AM 02/08/2022    2:36 PM 07/23/2021   10:15 AM  Depression screen PHQ 2/9  Decreased Interest 0 0 0  Down, Depressed, Hopeless 2 0 1  PHQ - 2 Score 2 0 1  Altered sleeping 0    Tired, decreased energy 1    Change in appetite 0    Feeling bad or failure about yourself  0  Trouble concentrating 1    Moving slowly or fidgety/restless 0    Suicidal thoughts 0    PHQ-9 Score 4      Social History   Tobacco Use   Smoking status: Never   Smokeless tobacco: Never  Vaping Use   Vaping status: Never Used  Substance Use Topics   Alcohol use: Never   Drug use: Never    Review of Systems Per HPI unless  specifically indicated above     Objective:    BP (!) 154/70 (BP Location: Left Arm, Cuff Size: Normal)   Ht 5\' 1"  (1.549 m)   Wt 121 lb (54.9 kg)   BMI 22.86 kg/m   Wt Readings from Last 3 Encounters:  12/15/22 121 lb (54.9 kg)  09/29/22 123 lb 14.4 oz (56.2 kg)  07/27/22 124 lb (56.2 kg)    Physical Exam Vitals and nursing note reviewed.  Constitutional:      General: She is not in acute distress.    Appearance: She is well-developed. She is not diaphoretic.     Comments: Chronically ill but currently well appearing, comfortable, cooperative  HENT:     Head: Normocephalic and atraumatic.  Eyes:     General:        Right eye: No discharge.        Left eye: No discharge.     Conjunctiva/sclera: Conjunctivae normal.  Neck:     Thyroid: No thyromegaly.  Cardiovascular:     Rate and Rhythm: Normal rate and regular rhythm.     Heart sounds: Normal heart sounds. No murmur heard. Pulmonary:     Effort: Pulmonary effort is normal. No respiratory distress.     Breath sounds: Normal breath sounds. No wheezing or rales.  Musculoskeletal:     Cervical back: Normal range of motion and neck supple.     Comments: R shoulder reduced ROM forward flex and abduction, unable to do internal rotation, provoked pain. Impingement positive  Lymphadenopathy:     Cervical: No cervical adenopathy.  Skin:    General: Skin is warm and dry.     Findings: No erythema or rash.  Neurological:     Mental Status: She is alert and oriented to person, place, and time.  Psychiatric:        Behavior: Behavior normal.     Comments: Well groomed, good eye contact, normal speech and thoughts     Joint Injection/Arthrocentesis  Date/Time: 12/15/2022 11:30 AM  Performed by: Smitty Cords, DO Authorized by: Smitty Cords, DO  Indications: pain  Body area: shoulder Joint: right subacromial bursa Local anesthesia used: yes  Anesthesia: Local anesthesia used: yes Local  Anesthetic: topical anesthetic (cold spray)  Sedation: Patient sedated: no  Needle gauge: 21g. Ultrasound guidance: no Approach: posterior Aspirate amount: 0 mL Methylprednisolone amount: 40 mg Lidocaine 1% amount: 4 mL Patient tolerance: patient tolerated the procedure well with no immediate complications         12/15/2022   10:43 AM 07/23/2021   10:17 AM  6CIT Screen  What Year? 0 points 0 points  What month? 0 points 0 points  What time? 0 points 0 points  Count back from 20 0 points 0 points  Months in reverse 4 points 0 points  Repeat phrase 0 points 0 points  Total Score 4 points 0 points      Results for orders placed or performed during the hospital encounter of 09/29/22  CBC with Differential  Result Value Ref Range  WBC 4.1 4.0 - 10.5 K/uL   RBC 3.40 (L) 3.87 - 5.11 MIL/uL   Hemoglobin 9.5 (L) 12.0 - 15.0 g/dL   HCT 40.9 (L) 81.1 - 91.4 %   MCV 83.8 80.0 - 100.0 fL   MCH 27.9 26.0 - 34.0 pg   MCHC 33.3 30.0 - 36.0 g/dL   RDW 78.2 (H) 95.6 - 21.3 %   Platelets 264 150 - 400 K/uL   nRBC 0.0 0.0 - 0.2 %   Neutrophils Relative % 43 %   Neutro Abs 1.8 1.7 - 7.7 K/uL   Lymphocytes Relative 42 %   Lymphs Abs 1.8 0.7 - 4.0 K/uL   Monocytes Relative 11 %   Monocytes Absolute 0.4 0.1 - 1.0 K/uL   Eosinophils Relative 3 %   Eosinophils Absolute 0.1 0.0 - 0.5 K/uL   Basophils Relative 1 %   Basophils Absolute 0.0 0.0 - 0.1 K/uL   Immature Granulocytes 0 %   Abs Immature Granulocytes 0.01 0.00 - 0.07 K/uL  Comprehensive metabolic panel  Result Value Ref Range   Sodium 134 (L) 135 - 145 mmol/L   Potassium 4.3 3.5 - 5.1 mmol/L   Chloride 105 98 - 111 mmol/L   CO2 26 22 - 32 mmol/L   Glucose, Bld 101 (H) 70 - 99 mg/dL   BUN 17 8 - 23 mg/dL   Creatinine, Ser 0.86 (H) 0.44 - 1.00 mg/dL   Calcium 8.5 (L) 8.9 - 10.3 mg/dL   Total Protein 7.4 6.5 - 8.1 g/dL   Albumin 3.5 3.5 - 5.0 g/dL   AST 22 15 - 41 U/L   ALT 10 0 - 44 U/L   Alkaline Phosphatase 42 38  - 126 U/L   Total Bilirubin 0.7 0.3 - 1.2 mg/dL   GFR, Estimated 40 (L) >60 mL/min   Anion gap 3 (L) 5 - 15  Protime-INR  Result Value Ref Range   Prothrombin Time 15.2 11.4 - 15.2 seconds   INR 1.2 0.8 - 1.2  APTT  Result Value Ref Range   aPTT 28 24 - 36 seconds      Assessment & Plan:   Problem List Items Addressed This Visit     Chronic headaches   Relevant Medications   amLODipine (NORVASC) 10 MG tablet   mirtazapine (REMERON) 15 MG tablet   Essential hypertension - Primary   Relevant Medications   amLODipine (NORVASC) 10 MG tablet   Rotator cuff arthropathy of right shoulder   Other Visit Diagnoses     Loss of appetite       Relevant Medications   mirtazapine (REMERON) 15 MG tablet   Chronic right shoulder pain       Relevant Medications   mirtazapine (REMERON) 15 MG tablet   lidocaine (PF) (XYLOCAINE) 1 % injection 4 mL (Completed)   methylPREDNISolone acetate (DEPO-MEDROL) injection 40 mg (Completed)       Hypertension, uncontrolled Blood pressure reading of 170/68, patient reports dizziness and headaches.  - Currently still on Lisinopril -hydrochlorothiazide 20-12.5mg  daily - Seems to have run out of Amlodipine 10mg  daily - REORDER Amlodipine 10mg  daily  Right Shoulder Arthritis / Bursitis / Rotator Cuff Tendinopathy Chronic problem since prior to 2020 Patient reports pain and numbness in hands and shoulder, with visible lumps and asymmetry in shoulder height. Last injection 2020 Prior Orthopedics. She is not a surgical candidate -Apply topical Voltaren gel to affected areas as needed for pain relief. -Administer steroid injection in right shoulder today.  Unintentional  Weight Loss Chronic problem poor appetite Patient has lost significant weight, currently taking mirtazapine which may have run out. -Refill mirtazapine prescription for 90 days to stimulate appetite and promote weight gain.  Cognitive Decline Patient has gotten lost while driving on  two recent occasions, family is concerned about increasing confusion. -Monitor cognitive status, consider adding medication for cognitive decline if symptoms persist after addressing other health issues. - Agree with family taking keys away from her. She should AVOID driving at this time. - Consider Aricept, but she has GI symptoms now and they prefer to avoid worsening possible diarrhea GI  RESTART Aspirin 81mg   -Check blood pressure regularly at home. -Discontinue driving for safety due to cognitive decline.      Patient previously with Chronic Care Management Team, I have coordinated with Alto Denver RN for re-establishing patient with [facility] services for care management and other disciplines.   Orders Placed This Encounter  Procedures   Joint Injection/Arthrocentesis    This order was created via procedure documentation     Meds ordered this encounter  Medications   amLODipine (NORVASC) 10 MG tablet    Sig: Take 1 tablet (10 mg total) by mouth daily.    Dispense:  90 tablet    Refill:  3   mirtazapine (REMERON) 15 MG tablet    Sig: Take 1 tablet (15 mg total) by mouth at bedtime.    Dispense:  90 tablet    Refill:  3   lidocaine (PF) (XYLOCAINE) 1 % injection 4 mL   methylPREDNISolone acetate (DEPO-MEDROL) injection 40 mg      Follow up plan: Return if symptoms worsen or fail to improve.  Saralyn Pilar, DO [facility] 12/15/2022, 11:02 AM

## 2022-12-15 NOTE — Patient Instructions (Addendum)
Thank you for coming to the office today.  Start Aspirin 81mg  daily OTC  Elevated Blood pressure  We will add back the Amlodipine 10mg  daily in morning. Continue current BP medication as well.  START anti inflammatory topical - OTC Voltaren (generic Diclofenac) topical 2-4 times a day as needed for pain swelling of affected joint for 1-2 weeks or longer.  Restart Remeron (Mirtazapine) 15mg  nightly  You received a Right Shoulder Joint steroid injection today. - Lidocaine numbing medicine may ease the pain initially for a few hours until it wears off - As discussed, you may experience a "steroid flare" this evening or within 24-48 hours, anytime medicine is injected into an inflamed joint it can cause the pain to get worse temporarily - Everyone responds differently to these injections, it depends on the patient and the severity of the joint problem, it may provide anywhere from days to weeks, to months of relief. Ideal response is >6 months relief - Try to take it easy for next 1-2 days, avoid over activity and strain on joint (limit lifting for shoulder) - Recommend the following:   - For swelling - rest, compression sleeve / ACE wrap, elevation, and ice packs as needed for first few days   - For pain in future may use heating pad or moist heat as needed   Please schedule a Follow-up Appointment to: Return if symptoms worsen or fail to improve.  If you have any other questions or concerns, please feel free to call the office or send a message through MyChart. You may also schedule an earlier appointment if necessary.  Additionally, you may be receiving a survey about your experience at our office within a few days to 1 week by e-mail or mail. We value your feedback.  Saralyn Pilar, DO [facility], [place]

## 2022-12-17 ENCOUNTER — Other Ambulatory Visit: Payer: Medicare Other | Admitting: Pharmacist

## 2022-12-17 NOTE — Progress Notes (Signed)
12/17/2022 Name: Ellen [name] MRN: [id number] DOB: [birth year]  Chief Complaint  Patient presents with   Medication Adherence   Medication Management    Ellen [name] is a 89 y.o. year old female who was re-referred to the pharmacist by their PCP for assistance in managing hypertension and medication adherence .   Note previously engaged with patient, but stopped following as became unable to reach patient; daughter denied further need for follow up from pharmacist at that time.  Today unsuccessful in reaching patient by telephone and unable to leave a message as her voicemail is full.  Reach patient's daughter, Ellen [name] (listed on DPR), by telephone today  Subjective:  Care Team: Primary Care Provider: Smitty Cords, DO ; Next Scheduled Visit: 03/17/2023 Nurse Care Manager: Alto Denver, RN; Next Scheduled Visit: 01/28/2023  Medication Access/Adherence  Current Pharmacy:  [id number] Nicholes Rough, [place] - [address] AT [facility] [address] [place] [address] Phone: [phone] Fax: [phone]   Patient reports affordability concerns with their medications: No  Patient reports access/transportation concerns to their pharmacy: No  Patient reports adherence concerns with their medications:  Yes    Daughter notes patient has been having difficulty with her short term memory. Reports has started using weekly pillbox and calling patient multiple times/day to aid her with adherence to medications, regular meals and to check in - Notes patient no longer driving  From review of chart, note patient seen by PCP for office visit on 12/15/2022. Provider advised patient: - Restart Aspirin 81mg  daily OTC  - Add back the Amlodipine 10mg  daily in morning  - START anti inflammatory topical - OTC Voltaren (generic Diclofenac) topical 2-4 times a day as needed for pain swelling of affected joint for 1-2 weeks or longer  -  Restart Remeron (Mirtazapine) 15mg  nightly   Today caregiver reports that they picked up the OTC aspirin and diclofenac gel, as well as prescriptions for amlodipine and mirtazapine from pharmacy on 11/13  Hypertension:  Current medications:  - amlodipine 10 mg daily - reports picked up new prescription on 11/3 and patient restarted taking - lisinopril-HCTZ 20-12.5 mg daily  Medications previously tried: clonidine   Reports just got patient a new automated, upper arm home BP cuff Unable to review readings today as caregiver is not currently with patient   Objective:  Lab Results  Component Value Date   CREATININE 1.28 (H) 09/29/2022   BUN 17 09/29/2022   NA 134 (L) 09/29/2022   K 4.3 09/29/2022   CL 105 09/29/2022   CO2 26 09/29/2022    Lab Results  Component Value Date   CHOL 178 01/31/2022   HDL 56 01/31/2022   LDLCALC 113 (H) 01/31/2022   TRIG 45 01/31/2022   CHOLHDL 3.2 01/31/2022   BP Readings from Last 3 Encounters:  12/15/22 (!) 154/70  09/29/22 (!) 217/75  07/27/22 (!) 176/78   Pulse Readings from Last 3 Encounters:  09/29/22 60  07/27/22 60  06/29/22 62     Current Outpatient Medications on File Prior to Visit  Medication Sig Dispense Refill   Accu-Chek Softclix Lancets lancets Use to check blood sugar up to twice daily 200 each 3   acetaminophen (TYLENOL) 500 MG tablet Take 1,000 mg by mouth 2 (two) times daily as needed for mild pain (pain score 1-3) or moderate pain (pain score 4-6).     amLODipine (NORVASC) 10 MG tablet Take 1 tablet (10  mg total) by mouth daily. 90 tablet 3   aspirin 81 MG chewable tablet Chew 81 mg by mouth daily.      atorvastatin (LIPITOR) 40 MG tablet Take 1 tablet (40 mg total) by mouth daily. 90 tablet 3   baclofen (LIORESAL) 10 MG tablet Take 0.5-1 tablets (5-10 mg total) by mouth 3 (three) times daily as needed for muscle spasms. 90 each 2   Blood Glucose Monitoring Suppl (ACCU-CHEK GUIDE) w/Device KIT Use as directed to  monitor blood sugar 1 kit 0   Blood Pressure Monitoring DEVI Use to check BP daily 1 Device 0   clopidogrel (PLAVIX) 75 MG tablet Take 1 tablet (75 mg total) by mouth daily. 90 tablet 3   glucose blood (ACCU-CHEK GUIDE) test strip Use to check blood sugar up to twice daily 200 each 3   Lancets (ONETOUCH ULTRASOFT) lancets Use to check blood sugar daily, up to 2 times if need 100 each 3   lisinopril-hydrochlorothiazide (ZESTORETIC) 20-12.5 MG tablet Take 1 tablet by mouth daily. 90 tablet 3   mirtazapine (REMERON) 15 MG tablet Take 1 tablet (15 mg total) by mouth at bedtime. 90 tablet 3   Multiple Vitamins-Minerals (CERTAVITE SENIOR/ANTIOXIDANT) TABS Take 1 tablet by mouth daily.     No current facility-administered medications on file prior to visit.     Assessment/Plan:   Hypertension: - Currently uncontrolled based on latest in office readings - Reviewed long term cardiovascular and renal outcomes of uncontrolled blood pressure - Recommend to monitor home blood pressure, keep log of results and have this record to review at upcoming medical appointments. Patient to contact provider office sooner if needed for readings outside of established parameters or symptoms   Follow Up Plan: Clinical Pharmacist will follow up with patient and caregiver by telephone on 12/24/2022 at 11:30 AM to review home blood pressure readings  Estelle Grumbles, PharmD, [place], CPP Clinical Pharmacist [facility] [phone]

## 2022-12-17 NOTE — Patient Instructions (Signed)
Goals Addressed             This Visit's Progress    Pharmacy Goals       Check your blood pressure once daily, and any time you have concerning symptoms like headache, chest pain, dizziness, shortness of breath, or vision changes.   Our goal is less than 130/80.  To appropriately check your blood pressure, make sure you do the following:  1) Avoid caffeine, exercise, or tobacco products for 30 minutes before checking. Empty your bladder. 2) Sit with your back supported in a flat-backed chair. Rest your arm on something flat (arm of the chair, table, etc). 3) Sit still with your feet flat on the floor, resting, for at least 5 minutes.  4) Check your blood pressure. Take 1-2 readings.  5) Write down these readings and bring with you to any provider appointments.  Bring your home blood pressure machine with you to a provider's office for accuracy comparison at least once a year.   Make sure you take your blood pressure medications before you come to any office visit, even if you were asked to fast for labs.   Feel free to call me with any questions or concerns. I look forward to our next call!   Estelle Grumbles, PharmD, [place], CPP Clinical Pharmacist [facility] [phone]

## 2022-12-21 ENCOUNTER — Emergency Department
Admission: EM | Admit: 2022-12-21 | Discharge: 2022-12-21 | Discharge status: Against Medical Advice | Payer: Medicare Other | Attending: Emergency Medicine | Admitting: Emergency Medicine

## 2022-12-21 ENCOUNTER — Encounter: Payer: Self-pay | Admitting: Emergency Medicine

## 2022-12-21 ENCOUNTER — Other Ambulatory Visit: Payer: Self-pay

## 2022-12-21 DIAGNOSIS — G4489 Other headache syndrome: Secondary | ICD-10-CM | POA: Diagnosis not present

## 2022-12-21 DIAGNOSIS — R2231 Localized swelling, mass and lump, right upper limb: Secondary | ICD-10-CM | POA: Insufficient documentation

## 2022-12-21 DIAGNOSIS — R030 Elevated blood-pressure reading, without diagnosis of hypertension: Secondary | ICD-10-CM | POA: Insufficient documentation

## 2022-12-21 DIAGNOSIS — R519 Headache, unspecified: Secondary | ICD-10-CM | POA: Insufficient documentation

## 2022-12-21 DIAGNOSIS — Z5321 Procedure and treatment not carried out due to patient leaving prior to being seen by health care provider: Secondary | ICD-10-CM | POA: Diagnosis not present

## 2022-12-21 DIAGNOSIS — R609 Edema, unspecified: Secondary | ICD-10-CM | POA: Diagnosis not present

## 2022-12-21 DIAGNOSIS — Z743 Need for continuous supervision: Secondary | ICD-10-CM | POA: Diagnosis not present

## 2022-12-21 DIAGNOSIS — R6889 Other general symptoms and signs: Secondary | ICD-10-CM | POA: Diagnosis not present

## 2022-12-21 LAB — CBC WITH DIFFERENTIAL/PLATELET
Abs Immature Granulocytes: 0 10*3/uL (ref 0.00–0.07)
Basophils Absolute: 0 10*3/uL (ref 0.0–0.1)
Basophils Relative: 1 %
Eosinophils Absolute: 0.1 10*3/uL (ref 0.0–0.5)
Eosinophils Relative: 3 %
HCT: 32.6 % — ABNORMAL LOW (ref 36.0–46.0)
Hemoglobin: 10.5 g/dL — ABNORMAL LOW (ref 12.0–15.0)
Immature Granulocytes: 0 %
Lymphocytes Relative: 44 %
Lymphs Abs: 2.1 10*3/uL (ref 0.7–4.0)
MCH: 28.2 pg (ref 26.0–34.0)
MCHC: 32.2 g/dL (ref 30.0–36.0)
MCV: 87.4 fL (ref 80.0–100.0)
Monocytes Absolute: 0.3 10*3/uL (ref 0.1–1.0)
Monocytes Relative: 7 %
Neutro Abs: 2.1 10*3/uL (ref 1.7–7.7)
Neutrophils Relative %: 45 %
Platelets: 270 10*3/uL (ref 150–400)
RBC: 3.73 MIL/uL — ABNORMAL LOW (ref 3.87–5.11)
RDW: 16.5 % — ABNORMAL HIGH (ref 11.5–15.5)
WBC: 4.8 10*3/uL (ref 4.0–10.5)
nRBC: 0 % (ref 0.0–0.2)

## 2022-12-21 LAB — BASIC METABOLIC PANEL
Anion gap: 6 (ref 5–15)
BUN: 22 mg/dL (ref 8–23)
CO2: 28 mmol/L (ref 22–32)
Calcium: 9.2 mg/dL (ref 8.9–10.3)
Chloride: 104 mmol/L (ref 98–111)
Creatinine, Ser: 1.09 mg/dL — ABNORMAL HIGH (ref 0.44–1.00)
GFR, Estimated: 48 mL/min — ABNORMAL LOW (ref >60)
Glucose, Bld: 97 mg/dL (ref 70–99)
Potassium: 4.1 mmol/L (ref 3.5–5.1)
Sodium: 138 mmol/L (ref 135–145)

## 2022-12-21 NOTE — ED Triage Notes (Signed)
Arrives from home via [facility].  C/O headache x 2-3 weeks.  BP has been elevated. Taking BP meds, but not improving BP.  Also c/o right arm swelling x 1 week.  64 99% 150/62

## 2022-12-21 NOTE — ED Triage Notes (Signed)
Patient to ED via [facility] from home for headache x2 weeks and right arm swelling x1 week. States BP has been running in the 170's and has been taking BP meds but not improving.

## 2022-12-22 ENCOUNTER — Telehealth: Payer: Self-pay

## 2022-12-22 NOTE — Transitions of Care (Post Inpatient/ED Visit) (Unsigned)
   12/22/2022  Name: Ellen [name] MRN: [id number] DOB: 01/06/1930  Today's TOC FU Call Status: Today's TOC FU Call Status:: Unsuccessful Call (1st Attempt) Unsuccessful Call (1st Attempt) Date: 12/22/22  Attempted to reach the patient regarding the most recent Inpatient/ED visit.  Follow Up Plan: Additional outreach attempts will be made to reach the patient to complete the Transitions of Care (Post Inpatient/ED visit) call.   Signature Karena Addison, LPN [facility] Nurse Health Advisor Direct Dial [phone]

## 2022-12-23 NOTE — Transitions of Care (Post Inpatient/ED Visit) (Signed)
12/23/2022  Name: Ellen [name] MRN: [id number] DOB: 12/12/1929  Today's TOC FU Call Status: Today's TOC FU Call Status:: Successful TOC FU Call Completed Unsuccessful Call (1st Attempt) Date: 12/22/22 [facility] FU Call Complete Date: 12/23/22 Patient's Name and Date of Birth confirmed.  Transition Care Management Follow-up Telephone Call Date of Discharge: 12/22/22 Discharge Facility: [facility]) Type of Discharge: Emergency Department Reason for ED Visit: Other: (abd pain) How have you been since you were released from the hospital?: Same Any questions or concerns?: No  Items Reviewed: Did you receive and understand the discharge instructions provided?: Yes Any new allergies since your discharge?: No Dietary orders reviewed?: NA Do you have support at home?: Yes People in Home: child(ren), adult  Medications Reviewed Today: Medications Reviewed Today     Reviewed by Karena Addison, LPN (Licensed Practical Nurse) on 12/23/22 at 1540  Med List Status: <None>   Medication Order Taking? Sig Documenting Provider Last Dose Status Informant  Accu-Chek Softclix Lancets lancets [id number] No Use to check blood sugar up to twice daily Smitty Cords, DO Taking Active Other  acetaminophen (TYLENOL) 500 MG tablet [id number] No Take 1,000 mg by mouth 2 (two) times daily as needed for mild pain (pain score 1-3) or moderate pain (pain score 4-6). [provider] Taking Active Other  amLODipine (NORVASC) 10 MG tablet [id number]  Take 1 tablet (10 mg total) by mouth daily. Smitty Cords, DO  Active   aspirin 81 MG chewable tablet [id number] No Chew 81 mg by mouth daily.  [provider] Taking Active Self           Med Note Christell Constant, ANA K   Wed Jun 07, 2018 11:10 AM)    atorvastatin (LIPITOR) 40 MG tablet [id number] No Take 1 tablet (40 mg total) by mouth daily. Smitty Cords, DO Taking Active   baclofen (LIORESAL) 10 MG  tablet [id number] No Take 0.5-1 tablets (5-10 mg total) by mouth 3 (three) times daily as needed for muscle spasms. Smitty Cords, DO Taking Active   Blood Glucose Monitoring Suppl (ACCU-CHEK GUIDE) w/Device KIT [id number] No Use as directed to monitor blood sugar Smitty Cords, DO Taking Active Other  Blood Pressure Monitoring DEVI [id number] No Use to check BP daily Smitty Cords, DO Taking Active   clopidogrel (PLAVIX) 75 MG tablet [id number] No Take 1 tablet (75 mg total) by mouth daily. Smitty Cords, DO Taking Active   glucose blood (ACCU-CHEK GUIDE) test strip [id number] No Use to check blood sugar up to twice daily Smitty Cords, DO Taking Active Other  Lancets [facility] ULTRASOFT) lancets [id number] No Use to check blood sugar daily, up to 2 times if need Smitty Cords, DO Taking Active Other  lisinopril-hydrochlorothiazide (ZESTORETIC) 20-12.5 MG tablet [id number] No Take 1 tablet by mouth daily. Smitty Cords, DO Taking Active   mirtazapine (REMERON) 15 MG tablet [id number]  Take 1 tablet (15 mg total) by mouth at bedtime. Smitty Cords, DO  Active   Multiple Vitamins-Minerals (CERTAVITE SENIOR/ANTIOXIDANT) TABS [id number] No Take 1 tablet by mouth daily. [provider] Taking Active Self            Home Care and Equipment/Supplies: Were Home Health Services Ordered?: NA Any new equipment or medical supplies ordered?: NA  Functional Questionnaire: Do you need assistance with bathing/showering or dressing?: No Do you need assistance with meal preparation?: No Do you need assistance with eating?: No Do you have difficulty  maintaining continence: No Do you need assistance with getting out of bed/getting out of a chair/moving?: No Do you have difficulty managing or taking your medications?: No  Follow up appointments reviewed: PCP Follow-up appointment confirmed?: No (no avail appt. sent  message to staff to schedule) MD Provider Line Number:[phone] Given: No Specialist Hospital Follow-up appointment confirmed?: NA Do you need transportation to your follow-up appointment?: No Do you understand care options if your condition(s) worsen?: Yes-patient verbalized understanding    SIGNATURE Karena Addison, LPN [facility] Nurse Health Advisor Direct Dial [phone]

## 2022-12-24 ENCOUNTER — Other Ambulatory Visit: Payer: Medicare Other | Admitting: Pharmacist

## 2022-12-24 DIAGNOSIS — I10 Essential (primary) hypertension: Secondary | ICD-10-CM

## 2022-12-24 NOTE — Progress Notes (Signed)
12/24/2022 Name: Ellen [name] MRN: [id number] DOB: [birth year]  Chief Complaint  Patient presents with   Medication Management   Medication Adherence    Ellen [name] is a 89 y.o. year old female who presented for a telephone visit.   They were referred to the pharmacist by their PCP for assistance in managing hypertension and medication adherence.   Speak with both patient and daughter, Ellen [name], by telephone today  Subjective:  Care Team: Primary Care Provider: Smitty Cords, DO ; Next Scheduled Visit: 03/17/2023 Nurse Care Manager: Alto Denver, RN; Next Scheduled Visit: 01/28/2023  Medication Access/Adherence  Current Pharmacy:  [id number] [place], [place] - [address] AT [facility] [address] [place] [address] Phone: [phone] Fax: [phone]   Patient reports affordability concerns with their medications: No  Patient reports access/transportation concerns to their pharmacy: No  Patient reports adherence concerns with their medications:  Yes     Note patient has been having difficulty with her short term memory. Reports using weekly pillbox and calling patient multiple times/day to aid her with adherence to medications, regular meals and to check in - Notes patient no longer driving  Hypertension:   Current medications:  - amlodipine 10 mg daily - lisinopril-HCTZ 20-12.5 mg daily   Medications previously tried: clonidine   Admits to sometimes adding some salt to her food   Drinks 1 cup of decaffeinated coffee daily   Reports just got patient a new automated, upper arm home BP cuff Unable to review readings has has not yet checked BP today and has not been writing these readings down  Current physical activity: stays active throughout the day  Objective:   Lab Results  Component Value Date   CREATININE 1.09 (H) 12/21/2022   BUN 22 12/21/2022   NA 138 12/21/2022   K 4.1 12/21/2022    CL 104 12/21/2022   CO2 28 12/21/2022   BP Readings from Last 3 Encounters:  12/21/22 (!) 146/58  12/15/22 (!) 154/70  09/29/22 (!) 217/75   Pulse Readings from Last 3 Encounters:  12/21/22 60  09/29/22 60  07/27/22 60    Medications Reviewed Today     Reviewed by Manuela Neptune, RPH-CPP (Pharmacist) on 12/24/22 at 1235  Med List Status: <None>   Medication Order Taking? Sig Documenting Provider Last Dose Status Informant  Accu-Chek Softclix Lancets lancets [id number]  Use to check blood sugar up to twice daily Smitty Cords, DO  Active Other  acetaminophen (TYLENOL) 500 MG tablet [id number]  Take 1,000 mg by mouth 2 (two) times daily as needed for mild pain (pain score 1-3) or moderate pain (pain score 4-6). [provider]  Active Other  amLODipine (NORVASC) 10 MG tablet [id number] Yes Take 1 tablet (10 mg total) by mouth daily. Smitty Cords, DO Taking Active   aspirin 81 MG chewable tablet [id number]  Chew 81 mg by mouth daily.  [provider]  Active Self           Med Note Christell Constant, ANA K   Wed Jun 07, 2018 11:10 AM)    atorvastatin (LIPITOR) 40 MG tablet [id number] Yes Take 1 tablet (40 mg total) by mouth daily. Smitty Cords, DO Taking Active   baclofen (LIORESAL) 10 MG tablet [id number]  Take 0.5-1 tablets (5-10 mg total) by mouth 3 (three) times daily as needed for muscle spasms. Smitty Cords, DO  Active   Blood  Glucose Monitoring Suppl (ACCU-CHEK GUIDE) w/Device KIT [id number]  Use as directed to monitor blood sugar Smitty Cords, DO  Active Other  Blood Pressure Monitoring DEVI [id number]  Use to check BP daily Smitty Cords, DO  Active   clopidogrel (PLAVIX) 75 MG tablet [id number] Yes Take 1 tablet (75 mg total) by mouth daily. Smitty Cords, DO Taking Active   glucose blood (ACCU-CHEK GUIDE) test strip [id number]  Use to check blood sugar up to twice daily Smitty Cords,  DO  Active Other  Lancets [facility] ULTRASOFT) lancets [id number]  Use to check blood sugar daily, up to 2 times if need Smitty Cords, DO  Active Other  lisinopril-hydrochlorothiazide (ZESTORETIC) 20-12.5 MG tablet [id number] Yes Take 1 tablet by mouth daily. Smitty Cords, DO Taking Active   mirtazapine (REMERON) 15 MG tablet [id number] Yes Take 1 tablet (15 mg total) by mouth at bedtime. Smitty Cords, DO Taking Active   Multiple Vitamins-Minerals (CERTAVITE SENIOR/ANTIOXIDANT) TABS [id number]  Take 1 tablet by mouth daily. [provider]  Active Self              Assessment/Plan:   Comprehensive medication review performed; medication list updated in electronic medical record - Identify has not yet picked up OTC aspirin 81 mg or diclofenac gel, but plan to pick these up from the pharmacy for patient to take as directed by PCP  Place referral to LCSW for support to patient/caregiver needing support for social support and caregiver burden  Hypertension: - Currently uncontrolled based on latest in office readings - Reviewed long term cardiovascular and renal outcomes of uncontrolled blood pressure - Recommend to monitor home blood pressure, keep log of results and have this record to review at upcoming medical appointments. Patient to contact provider office sooner if needed for readings outside of established parameters or symptoms     Follow Up Plan: Clinical Pharmacist will follow up with patient and caregiver by telephone on 01/14/2023 at 11:00 AM    Estelle Grumbles, PharmD, Patsy Baltimore, CPP Clinical Pharmacist [facility] [phone]

## 2022-12-24 NOTE — Patient Instructions (Addendum)
Goals Addressed             This Visit's Progress    Pharmacy Goals       Check your blood pressure once daily, and any time you have concerning symptoms like headache, chest pain, dizziness, shortness of breath, or vision changes.   Our goal is less than 130/80.  To appropriately check your blood pressure, make sure you do the following:  1) Avoid caffeine, exercise, or tobacco products for 30 minutes before checking. Empty your bladder. 2) Sit with your back supported in a flat-backed chair. Rest your arm on something flat (arm of the chair, table, etc). 3) Sit still with your feet flat on the floor, resting, for at least 5 minutes.  4) Check your blood pressure. Take 1-2 readings.  5) Write down these readings and bring with you to any provider appointments.  Bring your home blood pressure machine with you to a provider's office for accuracy comparison at least once a year.   Make sure you take your blood pressure medications before you come to any office visit, even if you were asked to fast for labs.   Feel free to call me with any questions or concerns. I look forward to our next call!   Estelle Grumbles, PharmD, Patsy [place], CPP Clinical Pharmacist [facility] [phone]

## 2022-12-27 ENCOUNTER — Telehealth: Payer: Self-pay | Admitting: *Deleted

## 2022-12-27 NOTE — Progress Notes (Unsigned)
  Care Coordination  Outreach Note  12/27/2022 Name: Raheel Pangallo MRN: [id number] DOB: 06/15/1929   Care Coordination Outreach Attempts: An unsuccessful telephone outreach was attempted today to offer the patient information about available care coordination services.  Follow Up Plan:  Additional outreach attempts will be made to offer the patient care coordination information and services.   Encounter Outcome:  No Answer  Burman Nieves, CCMA Care Coordination Care Guide Direct Dial: [phone]

## 2022-12-28 NOTE — Progress Notes (Signed)
  Care Coordination   Note   12/28/2022 Name: Ellen [name] MRN: [id number] DOB: 02-21-1929  [name] is a 89 y.o. year old female who sees Smitty Cords, DO for primary care. I reached out to [employer] by phone today to offer care coordination services.  [name] was given information about Care Coordination services today including:   The Care Coordination services include support from the care team which includes your Nurse Coordinator, Clinical Social Worker, or Pharmacist.  The Care Coordination team is here to help remove barriers to the health concerns and goals most important to you. Care Coordination services are voluntary, and the patient may decline or stop services at any time by request to their care team member.   Care Coordination Consent Status: Patient agreed to services and verbal consent obtained.   Follow up plan:  Telephone appointment with care coordination team member scheduled for:  01/06/2023  Encounter Outcome:  Patient Scheduled from referral   Burman Nieves, [facility] Care Coordination Care Guide Direct Dial: [phone]

## 2023-01-06 ENCOUNTER — Ambulatory Visit: Payer: Self-pay | Admitting: *Deleted

## 2023-01-07 NOTE — Patient Instructions (Signed)
Visit Information  Thank you for taking time to visit with me today. Please don't hesitate to contact me if I can be of assistance to you.   Following are the goals we discussed today:   Goals Addressed             This Visit's Progress    community resources to address patient's care needs       Activities and task to complete in order to accomplish goals.   LEVELS OF CARE TASK Complete Advance Directive packet once received and reviewed Continue to consider additional in home care options to assist with patient's daily care         Our next appointment is by telephone on 02/03/23 at 10am  Please call the care guide team at [phone] if you need to cancel or reschedule your appointment.   If you are experiencing a Mental Health or Behavioral Health Crisis or need someone to talk to, please call 911   Patient verbalizes understanding of instructions and care plan provided today and agrees to view in MyChart. Active MyChart status and patient understanding of how to access instructions and care plan via MyChart confirmed with patient.     Telephone follow up appointment with care management team member scheduled for:02/03/23  Sheron Tallman, LCSW [place]  [facility], [facility] Licensed Clinical Social Worker Care Coordinator  Direct Dial: [phone]

## 2023-01-07 NOTE — Patient Outreach (Addendum)
  Care Coordination   Initial Visit Note   01/07/2023 Name: Ellen [name] MRN: [id number] DOB: Sep 12, 1929  Ellen [name] is a 89 y.o. year old female who sees Smitty Cords, DO for primary care. I spoke with  Ellen [name]'s daughter Ellen [name] by phone on 01/06/23.  What matters to the patients health and wellness today?  Additional in home care options to assist with patient's daily care.    Goals Addressed             This Visit's Progress    community resources to address patient's care needs       Activities and task to complete in order to accomplish goals.   LEVELS OF CARE TASK Complete Advance Directive packet once received and reviewed Continue to consider additional in home care options to assist with patient's daily care         SDOH assessments and interventions completed:  Yes  SDOH Interventions Today    Flowsheet Row Most Recent Value  SDOH Interventions   Food Insecurity Interventions Intervention Not Indicated  Housing Interventions Intervention Not Indicated  Transportation Interventions Intervention Not Indicated  Utilities Interventions Intervention Not Indicated  Social Connections Interventions Intervention Not Indicated        Care Coordination Interventions:  Yes, provided  Interventions Today    Flowsheet Row Most Recent Value  Chronic Disease   Chronic disease during today's visit Hypertension (HTN)  [dementia]  General Interventions   General Interventions Discussed/Reviewed General Interventions Discussed, [employer] assessment completed, patient lives independently, in her own home, daughter resides 56 miles away, currently refusing facility care, or additional help in the home care options discussed for care as patient's status progresses]  Education Interventions   Education Provided Provided Education  Provided Verbal Education On [employer], Other, Development worker, community  [options to manage patient's care  in the home as symptoms progress-confirmed insurance coverage-family planning medicaid]  Mental Health Interventions   Mental Health Discussed/Reviewed Other  Teacher, English as a foreign language stress acknowledged, emotional support provided, self care emphasized]  Nutrition Interventions   Nutrition Discussed/Reviewed Nutrition Discussed  [cooks own meals, groceries ordered online through [employer] or [employer]]  Safety Interventions   Safety Discussed/Reviewed Safety Discussed  [confirmed that pt has  med alert, no longer drives, daughter has taken keys]  Advanced Directive Interventions   Advanced Directives Discussed/Reviewed Advanced Directives Discussed  [Advanced Directive packet mailed to patient's daughter for review]       Follow up plan: Follow up call scheduled for 02/03/23    Encounter Outcome:  Patient Visit Completed

## 2023-01-14 ENCOUNTER — Other Ambulatory Visit: Payer: Medicare Other

## 2023-01-14 ENCOUNTER — Telehealth: Payer: Self-pay | Admitting: Pharmacist

## 2023-01-14 NOTE — Telephone Encounter (Signed)
   Outreach Note  01/14/2023 Name: Corrinne Brosman MRN: [id number] DOB: Mar 04, 1929  Referred by: Smitty Cords, DO Reason for referral : No chief complaint on file.   Was unable to reach patient/daughter via telephone today and have left HIPAA compliant voicemail asking patient to return my call.    Follow Up Plan: Will collaborate with Care Guide to outreach to schedule follow up with me  Estelle Grumbles, PharmD, Patsy [place], CPP Clinical Pharmacist [facility] [phone]

## 2023-01-28 ENCOUNTER — Other Ambulatory Visit: Payer: Self-pay

## 2023-01-28 ENCOUNTER — Other Ambulatory Visit: Payer: Medicare Other

## 2023-01-28 NOTE — Patient Outreach (Incomplete)
  Care Management   Visit Note  01/28/2023 Name: Ellen [name] MRN: [id number] DOB: April 09, 1929  Subjective: Ellen [name] is a 89 y.o. year old female who is a primary care patient of Smitty Cords, DO. The Care Management team was consulted for assistance.      Engaged with [name] via telephone.  Assessment:   Interventions: {CCM RNCM INTERVENTIONS:22296}  Recommendations:     Consent to Services:  {CAREMANAGEMENTCONSENTOPTIONS:24923}  Plan: {CM FOLLOW UP PLAN:22241}  SIG

## 2023-02-03 ENCOUNTER — Telehealth: Payer: Self-pay | Admitting: *Deleted

## 2023-02-03 ENCOUNTER — Encounter: Payer: Self-pay | Admitting: *Deleted

## 2023-02-03 NOTE — Patient Outreach (Signed)
  Care Coordination   02/03/2023 Name: Danely Bayliss MRN: [id number] DOB: 1929-10-17   Care Coordination Outreach Attempts:  An unsuccessful telephone outreach was attempted today to offer the patient information about available complex care management services.  Follow Up Plan:  Additional outreach attempts will be made to offer the patient complex care management information and services.   Encounter Outcome:  No Answer   Care Coordination Interventions:  No, not indicated    Evola Hollis, LCSW [place]  [facility] Licensed Clinical Social Worker Care Coordinator  Direct Dial: [phone]

## 2023-02-14 ENCOUNTER — Ambulatory Visit: Payer: Self-pay

## 2023-02-14 NOTE — Telephone Encounter (Signed)
 She has a long history of having chronic chest pain and headaches. Unfortunately if severe symptoms, I agree best option is ED. But we can see her as scheduled if she is absolutely refusing to go. Patient's daughter is helping making decisions for her and she would be the one to assist patient.   Marsa Officer, DO [facility] 02/14/2023, 10:29 AM

## 2023-02-14 NOTE — Telephone Encounter (Signed)
  Chief Complaint: Chest pain - pain behind ear that goes to head  - terrible HA Symptoms: above Frequency: 2-3 weeks Pertinent Negatives: Patient denies any other s/s Disposition: [] ED /[] Urgent Care (no appt availability in office) / [] Appointment(In office/virtual)/ []  [place] [facility]/ [] Home Care/ [x] Refused Recommended Disposition /[] [place] [facility]/ []  Follow-up with PCP Additional Notes: Call from pt's daughter. Pt with daughter. Pt has both chest pain and pain that starts behind her ear and goes into her head. Pt describes pain as severe. Pt refises ED and scheduled OV for Friday.  Please advise.   Reason for Disposition  [1] Chest pain lasts > 5 minutes AND [2] occurred in past 3 days (72 hours) (Exception: Feels exactly the same as previously diagnosed heartburn and has accompanying sour taste in mouth.)  Answer Assessment - Initial Assessment Questions 1. LOCATION: Where does it hurt?       Chest pain - pain goes up the back ear and goes into her head 2. RADIATION: Does the pain go anywhere else? (e.g., into neck, jaw, arms, back)     Into head 3. ONSET: When did the chest pain begin? (Minutes, hours or days)      2-3 weeks 4. PATTERN: Does the pain come and go, or has it been constant since it started?  Does it get worse with exertion?      constant 5. DURATION: How long does it last (e.g., seconds, minutes, hours)     NA 6. SEVERITY: How bad is the pain?  (e.g., Scale 1-10; mild, moderate, or severe)    - MILD (1-3): doesn't interfere with normal activities     - MODERATE (4-7): interferes with normal activities or awakens from sleep    - SEVERE (8-10): excruciating pain, unable to do any normal activities       severe 7. CARDIAC RISK FACTORS: Do you have any history of heart problems or risk factors for heart disease? (e.g., angina, prior heart attack; diabetes, high blood pressure, high cholesterol, smoker, or strong family history of heart  disease)     no 8. PULMONARY RISK FACTORS: Do you have any history of lung disease?  (e.g., blood clots in lung, asthma, emphysema, birth control pills)     no 9. CAUSE: What do you think is causing the chest pain?     Unknown 10. OTHER SYMPTOMS: Do you have any other symptoms? (e.g., dizziness, nausea, vomiting, sweating, fever, difficulty breathing, cough)       dizziness  Protocols used: Chest Pain-A-AH

## 2023-02-16 ENCOUNTER — Ambulatory Visit: Payer: Medicare Other | Admitting: Family Medicine

## 2023-02-18 ENCOUNTER — Encounter: Payer: Self-pay | Admitting: Family Medicine

## 2023-02-18 ENCOUNTER — Ambulatory Visit: Payer: Medicare Other | Admitting: Family Medicine

## 2023-02-18 ENCOUNTER — Other Ambulatory Visit: Payer: Self-pay | Admitting: Family Medicine

## 2023-02-18 VITALS — BP 138/64 | HR 74 | Ht 62.0 in | Wt 126.0 lb

## 2023-02-18 DIAGNOSIS — I10 Essential (primary) hypertension: Secondary | ICD-10-CM

## 2023-02-18 DIAGNOSIS — M25511 Pain in right shoulder: Secondary | ICD-10-CM

## 2023-02-18 DIAGNOSIS — G8929 Other chronic pain: Secondary | ICD-10-CM | POA: Diagnosis not present

## 2023-02-18 DIAGNOSIS — I69359 Hemiplegia and hemiparesis following cerebral infarction affecting unspecified side: Secondary | ICD-10-CM

## 2023-02-18 DIAGNOSIS — M12811 Other specific arthropathies, not elsewhere classified, right shoulder: Secondary | ICD-10-CM | POA: Diagnosis not present

## 2023-02-18 DIAGNOSIS — I6930 Unspecified sequelae of cerebral infarction: Secondary | ICD-10-CM

## 2023-02-18 DIAGNOSIS — E1169 Type 2 diabetes mellitus with other specified complication: Secondary | ICD-10-CM

## 2023-02-18 MED ORDER — LISINOPRIL-HYDROCHLOROTHIAZIDE 20-12.5 MG PO TABS
1.0000 | ORAL_TABLET | Freq: Every day | ORAL | 3 refills | Status: DC
Start: 2023-02-18 — End: 2023-03-17

## 2023-02-18 MED ORDER — ATORVASTATIN CALCIUM 40 MG PO TABS
40.0000 mg | ORAL_TABLET | Freq: Every day | ORAL | 3 refills | Status: DC
Start: 2023-02-18 — End: 2023-11-04

## 2023-02-18 MED ORDER — CLOPIDOGREL BISULFATE 75 MG PO TABS
75.0000 mg | ORAL_TABLET | Freq: Every day | ORAL | 3 refills | Status: DC
Start: 2023-02-18 — End: 2023-11-04

## 2023-02-18 NOTE — Patient Instructions (Addendum)
Thank you for coming to the office today.  Referral to Orthopedics for RIGHT SHOULDER management, this could be injections or other procedure or options.  Call back if not done yet.  [facility] ORTHOPEDICS & SPORTS MEDICINE [facility] [address] [place], [place]  [address] Phone: [phone]  Please schedule a Follow-up Appointment to: Return if symptoms worsen or fail to improve.  If you have any other questions or concerns, please feel free to call the office or send a message through MyChart. You may also schedule an earlier appointment if necessary.  Additionally, you may be receiving a survey about your experience at our office within a few days to 1 week by e-mail or mail. We value your feedback.  Saralyn Pilar, DO [facility] [place]

## 2023-02-18 NOTE — Progress Notes (Signed)
Subjective:    Patient ID: Ellen [name], female    DOB: 1929/05/30, 89 y.o.   MRN: [id number]  Ellen [name] is a 89 y.o. female presenting on 02/18/2023 for Right Shoulder pain rotator cuff   HPI  Discussed the use of AI scribe software for clinical note transcription with the patient, who gave verbal consent to proceed.  History of Present Illness    Right Shoulder Pain, Chronic / Rotator Cuff Arthropathy  The patient, with a history of severe arthritis and degenerative changes in the right shoulder, presents with worsening pain and functional impairment. She reports a loss of use in her right arm, from the shoulder down her arm. Difficulty raising arm. Often using Left arm to help support and lift R side. Pain interferes with function and triggers radiating pain at times. Also has arthritis nodules on hand R. The patient also describes a sensation of 'grits' in her hand when she moves her arm in a certain way, and her fingers often feel cold and numb. She has been managing these symptoms by moving her arm in a specific way to warm her fingers and alleviate the discomfort.  The patient also reports sporadic chronic chest pain, which she believes to be gas-related and have been evaluated multiple times at ED, and persistent headaches that originate from the neck.   She has previously received an injection 12/2022 (subacromial inj) for her shoulder pain, which provided temporary relief but the pain has since returned. The patient has expressed a desire to see an orthopedic specialist for further management of her symptoms. Note prior X-ray 09/2022 with advanced arthritis.         02/18/2023    3:06 PM 12/15/2022   10:49 AM 02/08/2022    2:36 PM  Depression screen PHQ 2/9  Decreased Interest 0 0 0  Down, Depressed, Hopeless 0 2 0  PHQ - 2 Score 0 2 0  Altered sleeping 1 0   Tired, decreased energy 1 1   Change in appetite 0 0   Feeling bad or failure about yourself  0 0   Trouble  concentrating 0 1   Moving slowly or fidgety/restless 0 0   Suicidal thoughts 0 0   PHQ-9 Score 2 4   Difficult doing work/chores Somewhat difficult         02/18/2023    3:07 PM 12/15/2022   10:49 AM 02/16/2021    9:50 AM  GAD 7 : Generalized Anxiety Score  Nervous, Anxious, on Edge 0 0 0  Control/stop worrying 0 0 0  Worry too much - different things 0 0 0  Trouble relaxing 0 0 0  Restless 0 0 0  Easily annoyed or irritable 0 0 0  Afraid - awful might happen 0 0 0  Total GAD 7 Score 0 0 0  Anxiety Difficulty Not difficult at all  Not difficult at all    Social History   Tobacco Use   Smoking status: Never   Smokeless tobacco: Never  Vaping Use   Vaping status: Never Used  Substance Use Topics   Alcohol use: Never   Drug use: Never    Review of Systems Per HPI unless specifically indicated above     Objective:    BP (!) 140/66   Pulse 74   Ht 5\' 2"  (1.575 m)   Wt 126 lb (57.2 kg)   SpO2 96%   BMI 23.05 kg/m   Wt Readings from Last 3 Encounters:  02/18/23 126  lb (57.2 kg)  12/21/22 121 lb (54.9 kg)  12/15/22 121 lb (54.9 kg)    Physical Exam Vitals and nursing note reviewed.  Constitutional:      General: She is not in acute distress.    Appearance: Normal appearance. She is well-developed. She is not diaphoretic.     Comments: Thin elderly 89 yr female uncomfortable w R Shoulder pain  HENT:     Head: Normocephalic and atraumatic.  Eyes:     General:        Right eye: No discharge.        Left eye: No discharge.     Conjunctiva/sclera: Conjunctivae normal.  Cardiovascular:     Rate and Rhythm: Normal rate.  Pulmonary:     Effort: Pulmonary effort is normal.  Musculoskeletal:     Comments: Unable to lift R shoulder significantly without assistance, unable to fully test rotator cuff today due to pain and weakness.  Skin:    General: Skin is warm and dry.     Findings: No erythema or rash.  Neurological:     Mental Status: She is alert and  oriented to person, place, and time.  Psychiatric:        Mood and Affect: Mood normal.        Behavior: Behavior normal.        Thought Content: Thought content normal.     Comments: Well groomed, good eye contact, normal speech and thoughts     I have personally reviewed the radiology report from 09/29/22 on R Shoulder X-ray.  CLINICAL DATA:  History of stroke with new right-sided weakness and right shoulder pain   EXAM: RIGHT SHOULDER - 2 VIEW   COMPARISON:  Chest radiograph dated 12/11/2019   FINDINGS: There is no evidence of fracture or dislocation. Superior subluxation of the right humeral head relative to the glenoid, similar to 12/11/2019. Severe degenerative changes of the right glenohumeral and acromioclavicular joints. Soft tissues are unremarkable.   IMPRESSION: 1. No acute fracture or dislocation. 2. Severe degenerative changes of the right glenohumeral and acromioclavicular joints. Findings of right rotator cuff injury.     Electronically Signed   By: Agustin Cree M.D.   On: 09/29/2022 12:00  Results for orders placed or performed during the hospital encounter of 12/21/22  CBC with Differential   Collection Time: 12/21/22 11:28 AM  Result Value Ref Range   WBC 4.8 4.0 - 10.5 K/uL   RBC 3.73 (L) 3.87 - 5.11 MIL/uL   Hemoglobin 10.5 (L) 12.0 - 15.0 g/dL   HCT 21.3 (L) 08.6 - 57.8 %   MCV 87.4 80.0 - 100.0 fL   MCH 28.2 26.0 - 34.0 pg   MCHC 32.2 30.0 - 36.0 g/dL   RDW 46.9 (H) 62.9 - 52.8 %   Platelets 270 150 - 400 K/uL   nRBC 0.0 0.0 - 0.2 %   Neutrophils Relative % 45 %   Neutro Abs 2.1 1.7 - 7.7 K/uL   Lymphocytes Relative 44 %   Lymphs Abs 2.1 0.7 - 4.0 K/uL   Monocytes Relative 7 %   Monocytes Absolute 0.3 0.1 - 1.0 K/uL   Eosinophils Relative 3 %   Eosinophils Absolute 0.1 0.0 - 0.5 K/uL   Basophils Relative 1 %   Basophils Absolute 0.0 0.0 - 0.1 K/uL   Immature Granulocytes 0 %   Abs Immature Granulocytes 0.00 0.00 - 0.07 K/uL  Basic  metabolic panel   Collection Time: 12/21/22 11:28 AM  Result Value Ref Range   Sodium 138 135 - 145 mmol/L   Potassium 4.1 3.5 - 5.1 mmol/L   Chloride 104 98 - 111 mmol/L   CO2 28 22 - 32 mmol/L   Glucose, Bld 97 70 - 99 mg/dL   BUN 22 8 - 23 mg/dL   Creatinine, Ser 1.61 (H) 0.44 - 1.00 mg/dL   Calcium 9.2 8.9 - 09.6 mg/dL   GFR, Estimated 48 (L) >60 mL/min   Anion gap 6 5 - 15      Assessment & Plan:   Problem List Items Addressed This Visit     Rotator cuff arthropathy of right shoulder   Relevant Orders   Ambulatory referral to Orthopedic Surgery   Other Visit Diagnoses       Chronic right shoulder pain    -  Primary   Relevant Orders   Ambulatory referral to Orthopedic Surgery       Chronic R Shoulder Pain / Rotator Cuff Arthropathy Severe Right Shoulder Arthritis    Chronic pain and limited mobility. Previous corticosteroid injection 12/2022 provided temporary relief (1-2 months).  X-rays reviewed from 2024, see above, severe arthritis  -Referral to orthopedics for further management options including possible injections or minor surgical procedures.   -If appointment is more than 2 weeks out, return to clinic for another corticosteroid injection. If not candidate for anything else we can perform injection every 3+ months as needed  Chronic Headaches   Already on treatment Possible link to shoulder and neck pain. If shoulder is affecting neck as well with muscles can trigger some cervicogenic headaches -Management to be addressed by orthopedics referral.    -Refill prescriptions for Lisinopril, cholesterol medication, and Plavix to be picked up at [facility] on [address] [place].        Orders Placed This Encounter  Procedures   Ambulatory referral to Orthopedic Surgery    Referral Priority:   Routine    Referral Type:   Surgical    Referral Reason:   Specialty Services Required    Requested Specialty:   Orthopedic Surgery    Number of Visits  Requested:   1    No orders of the defined types were placed in this encounter.   Follow up plan: Return if symptoms worsen or fail to improve.   Saralyn Pilar, DO [facility] 02/18/2023, 3:29 PM

## 2023-02-25 ENCOUNTER — Other Ambulatory Visit: Payer: Medicare Other

## 2023-03-04 ENCOUNTER — Other Ambulatory Visit: Payer: Medicare Other | Admitting: Pharmacist

## 2023-03-04 DIAGNOSIS — I10 Essential (primary) hypertension: Secondary | ICD-10-CM

## 2023-03-04 NOTE — Progress Notes (Unsigned)
03/05/2023 Name: Ellen [name] MRN: [id number] DOB: July 15, 1929  Chief Complaint  Patient presents with   Medication Management    Ellen [name] is a 89 y.o. year old female who presented for a telephone visit.   They were referred to the pharmacist by their PCP for assistance in managing hypertension and medication adherence.    Speak with both patient and daughter, [name], by telephone today   Subjective:   Care Team: Primary Care Provider: Smitty Cords, DO ; Next Scheduled Visit: 03/17/2023 Nurse Care Manager: Juanell Fairly, RN  Orthopedic Surgery: Aram Candela, MD; Next Scheduled Visit: 03/09/2023     Medication Access/Adherence  Current Pharmacy:  [facility] [phone] [place],  - [address] [place] [address] AT [facility] [address] [address] [place] [address] [place] [address] Phone: [phone] Fax: [phone]   Patient reports affordability concerns with their medications: No  Patient reports access/transportation concerns to their pharmacy: No  Patient reports adherence concerns with their medications:  No   Note patient has been having difficulty with her short term memory. Reports using weekly pillbox and daughter supports  - Notes patient no longer driving  Note patient is currently staying with daughter   Hypertension:   Current medications:  - amlodipine 10 mg daily - lisinopril-HCTZ 20-12.5 mg daily   Medications previously tried: clonidine    Admits to sometimes adding some salt to her food   Reports has an automated, upper arm home BP cuff Reports latest home BP readings: - Today: 163/72 (right after coffee and prior to medication) - Yesterday: 129/70, HR 80   Denies symptoms of hypotension such as dizziness or lightheadedness  Current physical activity: stays active throughout the day   Objective:   Lab Results  Component Value Date   CREATININE 1.09 (H) 12/21/2022   BUN 22 12/21/2022   NA  138 12/21/2022   K 4.1 12/21/2022   CL 104 12/21/2022   CO2 28 12/21/2022    Lab Results  Component Value Date   CHOL 178 01/31/2022   HDL 56 01/31/2022   LDLCALC 113 (H) 01/31/2022   TRIG 45 01/31/2022   CHOLHDL 3.2 01/31/2022   BP Readings from Last 3 Encounters:  02/18/23 138/64  12/21/22 (!) 146/58  12/15/22 (!) 154/70   Pulse Readings from Last 3 Encounters:  02/18/23 74  12/21/22 60  09/29/22 60     Medications Reviewed Today     Reviewed by Manuela Neptune, RPH-CPP (Pharmacist) on 03/04/23 at 1105  Med List Status: <None>   Medication Order Taking? Sig Documenting Provider Last Dose Status Informant  Accu-Chek Softclix Lancets lancets [id number]  Use to check blood sugar up to twice daily Smitty Cords, DO  Active Other  acetaminophen (TYLENOL) 500 MG tablet [id number]  Take 1,000 mg by mouth 2 (two) times daily as needed for mild pain (pain score 1-3) or moderate pain (pain score 4-6). [provider]  Active Other  amLODipine (NORVASC) 10 MG tablet [id number] Yes Take 1 tablet (10 mg total) by mouth daily. Smitty Cords, DO Taking Active   aspirin 81 MG chewable tablet [id number]  Chew 81 mg by mouth daily.   Patient not taking: Reported on 02/18/2023   [provider]  Active Self           Med Note Christell Constant, ANA K   Wed Jun 07, 2018 11:10 AM)    atorvastatin (LIPITOR) 40 MG tablet [id number]  Take 1 tablet (40 mg total) by mouth daily. Karamalegos, Netta Neat, DO  Active   baclofen (LIORESAL) 10 MG tablet [id number]  Take 0.5-1 tablets (5-10 mg total) by mouth 3 (three) times daily as needed for muscle spasms.  Patient not taking: Reported on 02/18/2023   Smitty Cords, DO  Active   Blood Glucose Monitoring Suppl (ACCU-CHEK GUIDE) w/Device KIT [id number]  Use as directed to monitor blood sugar Smitty Cords, DO  Active Other  Blood Pressure Monitoring DEVI [id number]  Use to check BP daily Smitty Cords, DO  Active   clopidogrel (PLAVIX) 75 MG tablet [id number] Yes Take 1 tablet (75 mg total) by mouth daily. Smitty Cords, DO Taking Active   glucose blood (ACCU-CHEK GUIDE) test strip [id number]  Use to check blood sugar up to twice daily Smitty Cords, DO  Active Other  Lancets [facility] ULTRASOFT) lancets [id number]  Use to check blood sugar daily, up to 2 times if need Smitty Cords, DO  Active Other  lisinopril-hydrochlorothiazide (ZESTORETIC) 20-12.5 MG tablet [id number] Yes Take 1 tablet by mouth daily. Smitty Cords, DO Taking Active   mirtazapine (REMERON) 15 MG tablet [id number]  Take 1 tablet (15 mg total) by mouth at bedtime. Smitty Cords, DO  Active   Multiple Vitamins-Minerals (CERTAVITE SENIOR/ANTIOXIDANT) TABS [id number]  Take 1 tablet by mouth daily.  Patient not taking: Reported on 02/18/2023   [provider]  Active Self              Assessment/Plan:   Hypertension: - Reviewed long term cardiovascular and renal outcomes of uncontrolled blood pressure - Recommend to continue to monitor home blood pressure, keep log of results and have this record to review at upcoming medical appointments. Patient to contact provider office sooner if needed for readings outside of established parameters or symptoms     Follow Up Plan: Clinical Pharmacist will follow up with patient and caregiver by telephone on 04/29/2023 at 11:00 AM    Estelle Grumbles, PharmD, Patsy [place], CPP Clinical Pharmacist [facility] [phone]

## 2023-03-09 DIAGNOSIS — M12811 Other specific arthropathies, not elsewhere classified, right shoulder: Secondary | ICD-10-CM | POA: Diagnosis not present

## 2023-03-09 DIAGNOSIS — M2351 Chronic instability of knee, right knee: Secondary | ICD-10-CM | POA: Diagnosis not present

## 2023-03-09 DIAGNOSIS — E1121 Type 2 diabetes mellitus with diabetic nephropathy: Secondary | ICD-10-CM | POA: Diagnosis not present

## 2023-03-09 DIAGNOSIS — T8484XA Pain due to internal orthopedic prosthetic devices, implants and grafts, initial encounter: Secondary | ICD-10-CM | POA: Diagnosis not present

## 2023-03-09 DIAGNOSIS — Z96651 Presence of right artificial knee joint: Secondary | ICD-10-CM | POA: Diagnosis not present

## 2023-03-17 ENCOUNTER — Encounter: Payer: Self-pay | Admitting: Family Medicine

## 2023-03-17 ENCOUNTER — Ambulatory Visit: Payer: Medicare Other | Admitting: Family Medicine

## 2023-03-17 VITALS — BP 148/64 | HR 60 | Ht 62.0 in | Wt 125.0 lb

## 2023-03-17 DIAGNOSIS — M25511 Pain in right shoulder: Secondary | ICD-10-CM

## 2023-03-17 DIAGNOSIS — I10 Essential (primary) hypertension: Secondary | ICD-10-CM | POA: Diagnosis not present

## 2023-03-17 DIAGNOSIS — M62838 Other muscle spasm: Secondary | ICD-10-CM | POA: Diagnosis not present

## 2023-03-17 DIAGNOSIS — I6930 Unspecified sequelae of cerebral infarction: Secondary | ICD-10-CM | POA: Diagnosis not present

## 2023-03-17 DIAGNOSIS — G8929 Other chronic pain: Secondary | ICD-10-CM

## 2023-03-17 DIAGNOSIS — M25512 Pain in left shoulder: Secondary | ICD-10-CM

## 2023-03-17 MED ORDER — BACLOFEN 10 MG PO TABS
5.0000 mg | ORAL_TABLET | Freq: Three times a day (TID) | ORAL | 3 refills | Status: DC | PRN
Start: 2023-03-17 — End: 2023-06-15

## 2023-03-17 NOTE — Patient Instructions (Addendum)
Thank you for coming to the office today.  Dose increase BP medication - Lisinopril-hydrochlorothiazide 20-12.5mg  - take TWO tablets back to back in the morning for BP control - If doing well and BP improved, keep up on this and call us in 3-4 weeks let me know when to re order it. I can order the higher dose = ONE pill a day.  If too strong and making you go to the bathroom too much, we can reduce it back to 1 a day and change the medication.  Continue Amlodipine 10mg  daily  For the Neck muscle spasm neck pain  Start taking Baclofen (Lioresal) 10mg  (muscle relaxant) - start with half (cut) to one whole pill at night as needed for next 1-3 nights (may make you drowsy, caution with driving) see how it affects you, then if tolerated increase to one pill 2 to 3 times a day or (every 8 hours as needed)  Keep up with [employer], schedule again in 3-6 months, 3 months if worsening pain or 6 months if doing well.  They should have scheduled or ordered Physical Therapy.  Phone [phone]  Ask them about scheduling somewhere near [place]   Please schedule a Follow-up Appointment to: Return in about 4 months (around 07/15/2023) for 4 month Follow-up HTN, Shoulder/Ortho updates, ?labs after if need.  If you have any other questions or concerns, please feel free to call the office or send a message through MyChart. You may also schedule an earlier appointment if necessary.  Additionally, you may be receiving a survey about your experience at our office within a few days to 1 week by e-mail or mail. We value your feedback.  Saralyn Pilar, DO [facility], [place]

## 2023-03-17 NOTE — Progress Notes (Addendum)
Subjective:    Patient ID: Ellen [name], female    DOB: Nov 13, 1929, 89 y.o.   MRN: [id number]  Ellen [name] is a 89 y.o. female presenting on 03/17/2023 for Arthritis, Shoulder Pain, and Hypertension   HPI  Discussed the use of AI scribe software for clinical note transcription with the patient, who gave verbal consent to proceed. Here with Daughter [name]  History of Present Illness    Ellen [name] is a 89 year old female with shoulder osteoarthritis who presents for follow-up on her shoulder and neck pain. She was referred by Dr. Vear Clock for evaluation of her shoulder condition.  She has a history of complete loss of joint space in the right shoulder, described as 'bone on bone', leading to significant pain. An ultrasound-guided humeral joint injection was administered approximately one week ago, providing pain relief. Despite this, she continues to experience difficulty lifting her shoulder, describing it as a 'bad shoulder'. - They referred her to PT but not scheduled yet  She experiences persistent neck pain, particularly on the right side, extending from behind the ear downwards. This pain is associated with muscle spasms, and she has difficulty turning her neck, sometimes needing to turn her whole body. She previously used baclofen for muscle spasms but has run out and did not call for a refill.  She monitors her blood pressure every other day, with readings as high as 195/78. Her blood pressure fluctuates, often increasing on painful days or after consuming salty foods or coffee. Home readings and office readings often average 140s/80s or 150s/90s - On Amlodipine 10mg  daily, lisinopril-hydrochlorothiazide 20-12.5mg  daily - Admits urinary frequency on diuretic  She is currently on four medications, including two for blood pressure, and experiences frequent urination, which disrupts her sleep.  She mentions issues with her eyes, describing them as 'blurry and teary  all the time'. She does not have an eye doctor currently and is seeking assistance in setting up an appointment with one in [place].             02/18/2023    3:06 PM 12/15/2022   10:49 AM 02/08/2022    2:36 PM  Depression screen PHQ 2/9  Decreased Interest 0 0 0  Down, Depressed, Hopeless 0 2 0  PHQ - 2 Score 0 2 0  Altered sleeping 1 0   Tired, decreased energy 1 1   Change in appetite 0 0   Feeling bad or failure about yourself  0 0   Trouble concentrating 0 1   Moving slowly or fidgety/restless 0 0   Suicidal thoughts 0 0   PHQ-9 Score 2 4   Difficult doing work/chores Somewhat difficult         02/18/2023    3:07 PM 12/15/2022   10:49 AM 02/16/2021    9:50 AM  GAD 7 : Generalized Anxiety Score  Nervous, Anxious, on Edge 0 0 0  Control/stop worrying 0 0 0  Worry too much - different things 0 0 0  Trouble relaxing 0 0 0  Restless 0 0 0  Easily annoyed or irritable 0 0 0  Afraid - awful might happen 0 0 0  Total GAD 7 Score 0 0 0  Anxiety Difficulty Not difficult at all  Not difficult at all    Social History   Tobacco Use   Smoking status: Never   Smokeless tobacco: Never  Vaping Use   Vaping status: Never Used  Substance Use Topics   Alcohol use: Never  Drug use: Never    Review of Systems Per HPI unless specifically indicated above     Objective:    BP (!) 148/64 (BP Location: Left Arm, Cuff Size: Normal)   Pulse 60   Ht 5\' 2"  (1.575 m)   Wt 125 lb (56.7 kg)   SpO2 97%   BMI 22.86 kg/m   Wt Readings from Last 3 Encounters:  03/17/23 125 lb (56.7 kg)  02/18/23 126 lb (57.2 kg)  12/21/22 121 lb (54.9 kg)    Physical Exam Vitals and nursing note reviewed.  Constitutional:      General: She is not in acute distress.    Appearance: Normal appearance. She is well-developed. She is not diaphoretic.     Comments: Thin elderly 89 yr female comfortable today  HENT:     Head: Normocephalic and atraumatic.  Eyes:     General:        Right eye: No  discharge.        Left eye: No discharge.     Conjunctiva/sclera: Conjunctivae normal.  Cardiovascular:     Rate and Rhythm: Normal rate.  Pulmonary:     Effort: Pulmonary effort is normal.  Musculoskeletal:     Comments: Unable to lift R shoulder significantly without assistance, unable to fully test rotator cuff today due to pain and weakness.  Skin:    General: Skin is warm and dry.     Findings: No erythema or rash.  Neurological:     Mental Status: She is alert and oriented to person, place, and time.  Psychiatric:        Mood and Affect: Mood normal.        Behavior: Behavior normal.        Thought Content: Thought content normal.     Comments: Well groomed, good eye contact, normal speech and thoughts      I have personally reviewed the radiology report from 03/09/23 on Shoulder R X-ray.  Per Orthopedics 03/09/2023 12:29 PM EST  AP Y and Grashey view of the right shoulder ordered interpreted by me in the office today.  Impression: Complete loss of glenohumeral joint space with high riding humeral head with erosion into the acromion.  No evidence of acute bony abnormality or abnormal bony lesions. Results for orders placed or performed during the hospital encounter of 12/21/22  CBC with Differential   Collection Time: 12/21/22 11:28 AM  Result Value Ref Range   WBC 4.8 4.0 - 10.5 K/uL   RBC 3.73 (L) 3.87 - 5.11 MIL/uL   Hemoglobin 10.5 (L) 12.0 - 15.0 g/dL   HCT 96.0 (L) 45.4 - 09.8 %   MCV 87.4 80.0 - 100.0 fL   MCH 28.2 26.0 - 34.0 pg   MCHC 32.2 30.0 - 36.0 g/dL   RDW 11.9 (H) 14.7 - 82.9 %   Platelets 270 150 - 400 K/uL   nRBC 0.0 0.0 - 0.2 %   Neutrophils Relative % 45 %   Neutro Abs 2.1 1.7 - 7.7 K/uL   Lymphocytes Relative 44 %   Lymphs Abs 2.1 0.7 - 4.0 K/uL   Monocytes Relative 7 %   Monocytes Absolute 0.3 0.1 - 1.0 K/uL   Eosinophils Relative 3 %   Eosinophils Absolute 0.1 0.0 - 0.5 K/uL   Basophils Relative 1 %   Basophils Absolute 0.0 0.0 - 0.1  K/uL   Immature Granulocytes 0 %   Abs Immature Granulocytes 0.00 0.00 - 0.07 K/uL  Basic metabolic panel  Collection Time: 12/21/22 11:28 AM  Result Value Ref Range   Sodium 138 135 - 145 mmol/L   Potassium 4.1 3.5 - 5.1 mmol/L   Chloride 104 98 - 111 mmol/L   CO2 28 22 - 32 mmol/L   Glucose, Bld 97 70 - 99 mg/dL   BUN 22 8 - 23 mg/dL   Creatinine, Ser 1.61 (H) 0.44 - 1.00 mg/dL   Calcium 9.2 8.9 - 09.6 mg/dL   GFR, Estimated 48 (L) >60 mL/min   Anion gap 6 5 - 15      Assessment & Plan:   Problem List Items Addressed This Visit     Chronic pain of both shoulders   Relevant Medications   baclofen (LIORESAL) 10 MG tablet   Essential hypertension   Relevant Medications   lisinopril-hydrochlorothiazide (ZESTORETIC) 20-12.5 MG tablet   History of CVA with residual deficit   Other Visit Diagnoses       Chronic right shoulder pain    -  Primary   Relevant Medications   baclofen (LIORESAL) 10 MG tablet     Muscle spasms of neck       Relevant Medications   baclofen (LIORESAL) 10 MG tablet        Right Shoulder Osteoarthritis Followed by [facility], recent visit 02/2023 X-ray reviewed. Complete loss of joint space noted on x-ray. Pain relief achieved with ultrasound-guided humeral joint injection. -Continue with orthopedic follow-up in 3-6 months for repeat injections and therapy -Initiate physical therapy as planned by orthopedic specialist. Gave them # to call to schedule. May need to relocate PT to closer to [facility]  Neck Pain and Muscle Spasms Likely related to shoulder dysfunction and possible spinal issues. -Refill Baclofen for muscle spasms and neck pain.  Hypertension Blood pressure readings fluctuating, with some readings being high. Continue Amlodipine 10mg  daily -Increase Lisinopril-HCTZ to 20-12.5mg , taking two tablets daily. = 40-25mg  dosing. If tolerated can keep this dosing, or we can switch off of this combo and trial ARB Valsartan option and +/-  the diuretic. She has frequency -Check blood pressure in 3-4 weeks and call office with results.  Eye Concerns Reports of blurry vision and excessive tearing. -Self-refer to [facility] for evaluation.  Follow-up in 4 months with potential lab work after consultation.         No orders of the defined types were placed in this encounter.   Meds ordered this encounter  Medications   baclofen (LIORESAL) 10 MG tablet    Sig: Take 0.5-1 tablets (5-10 mg total) by mouth 3 (three) times daily as needed for muscle spasms.    Dispense:  90 each    Refill:  3    Follow up plan: Return in about 4 months (around 07/15/2023) for 4 month Follow-up HTN, Shoulder/Ortho updates, ?labs after if need.   Saralyn Pilar, DO [facility] 03/17/2023, 10:54 AM

## 2023-04-25 ENCOUNTER — Telehealth: Payer: Self-pay | Admitting: Pharmacist

## 2023-04-25 ENCOUNTER — Other Ambulatory Visit: Payer: Medicare Other

## 2023-04-25 NOTE — Telephone Encounter (Signed)
   Outreach Note  04/25/2023 Name: Madelyne Millikan MRN: [id number] DOB: 1930-01-06  Referred by: Smitty Cords, DO  Was unable to reach patient/daughter via telephone today and have left HIPAA compliant voicemail asking patient to return my call.   Follow Up Plan: Will collaborate with Care Guide to outreach to schedule follow up with me  Estelle Grumbles, PharmD, Patsy [place], CPP Clinical Pharmacist [facility] [phone]

## 2023-04-28 DIAGNOSIS — R519 Headache, unspecified: Secondary | ICD-10-CM | POA: Diagnosis not present

## 2023-04-28 DIAGNOSIS — Z7982 Long term (current) use of aspirin: Secondary | ICD-10-CM | POA: Diagnosis not present

## 2023-04-28 DIAGNOSIS — I6782 Cerebral ischemia: Secondary | ICD-10-CM | POA: Diagnosis not present

## 2023-04-28 DIAGNOSIS — S2232XA Fracture of one rib, left side, initial encounter for closed fracture: Secondary | ICD-10-CM | POA: Diagnosis not present

## 2023-04-28 DIAGNOSIS — M50321 Other cervical disc degeneration at C4-C5 level: Secondary | ICD-10-CM | POA: Diagnosis not present

## 2023-04-28 DIAGNOSIS — S3992XA Unspecified injury of lower back, initial encounter: Secondary | ICD-10-CM | POA: Diagnosis not present

## 2023-04-28 DIAGNOSIS — M50323 Other cervical disc degeneration at C6-C7 level: Secondary | ICD-10-CM | POA: Diagnosis not present

## 2023-04-28 DIAGNOSIS — Z79899 Other long term (current) drug therapy: Secondary | ICD-10-CM | POA: Diagnosis not present

## 2023-04-28 DIAGNOSIS — S199XXA Unspecified injury of neck, initial encounter: Secondary | ICD-10-CM | POA: Diagnosis not present

## 2023-04-28 DIAGNOSIS — M4801 Spinal stenosis, occipito-atlanto-axial region: Secondary | ICD-10-CM | POA: Diagnosis not present

## 2023-04-28 DIAGNOSIS — I281 Aneurysm of pulmonary artery: Secondary | ICD-10-CM | POA: Diagnosis not present

## 2023-04-28 DIAGNOSIS — M50322 Other cervical disc degeneration at C5-C6 level: Secondary | ICD-10-CM | POA: Diagnosis not present

## 2023-04-28 DIAGNOSIS — M5030 Other cervical disc degeneration, unspecified cervical region: Secondary | ICD-10-CM | POA: Diagnosis not present

## 2023-04-28 DIAGNOSIS — M47815 Spondylosis without myelopathy or radiculopathy, thoracolumbar region: Secondary | ICD-10-CM | POA: Diagnosis not present

## 2023-04-28 DIAGNOSIS — R0789 Other chest pain: Secondary | ICD-10-CM | POA: Diagnosis not present

## 2023-04-28 DIAGNOSIS — M1990 Unspecified osteoarthritis, unspecified site: Secondary | ICD-10-CM | POA: Diagnosis not present

## 2023-04-28 DIAGNOSIS — G319 Degenerative disease of nervous system, unspecified: Secondary | ICD-10-CM | POA: Diagnosis not present

## 2023-04-28 DIAGNOSIS — S299XXA Unspecified injury of thorax, initial encounter: Secondary | ICD-10-CM | POA: Diagnosis not present

## 2023-04-28 DIAGNOSIS — E785 Hyperlipidemia, unspecified: Secondary | ICD-10-CM | POA: Diagnosis not present

## 2023-04-28 DIAGNOSIS — I10 Essential (primary) hypertension: Secondary | ICD-10-CM | POA: Diagnosis not present

## 2023-04-28 DIAGNOSIS — Z885 Allergy status to narcotic agent status: Secondary | ICD-10-CM | POA: Diagnosis not present

## 2023-04-28 DIAGNOSIS — M546 Pain in thoracic spine: Secondary | ICD-10-CM | POA: Diagnosis not present

## 2023-04-28 DIAGNOSIS — S2239XA Fracture of one rib, unspecified side, initial encounter for closed fracture: Secondary | ICD-10-CM | POA: Diagnosis not present

## 2023-04-28 DIAGNOSIS — E049 Nontoxic goiter, unspecified: Secondary | ICD-10-CM | POA: Diagnosis not present

## 2023-04-28 DIAGNOSIS — S2231XA Fracture of one rib, right side, initial encounter for closed fracture: Secondary | ICD-10-CM | POA: Diagnosis not present

## 2023-04-28 DIAGNOSIS — Z96651 Presence of right artificial knee joint: Secondary | ICD-10-CM | POA: Diagnosis not present

## 2023-04-28 DIAGNOSIS — M5031 Other cervical disc degeneration,  high cervical region: Secondary | ICD-10-CM | POA: Diagnosis not present

## 2023-04-28 DIAGNOSIS — Z8249 Family history of ischemic heart disease and other diseases of the circulatory system: Secondary | ICD-10-CM | POA: Diagnosis not present

## 2023-04-29 ENCOUNTER — Other Ambulatory Visit: Payer: Medicare Other

## 2023-05-02 ENCOUNTER — Telehealth: Payer: Self-pay

## 2023-05-02 NOTE — Transitions of Care (Post Inpatient/ED Visit) (Signed)
   05/02/2023  Name: Ersa Delaney MRN: [id number] DOB: 09-11-1929  Today's TOC FU Call Status: Today's TOC FU Call Status:: Unsuccessful Call (1st Attempt) Unsuccessful Call (1st Attempt) Date: 05/02/23  Attempted to reach the patient regarding the most recent Inpatient/ED visit.  Follow Up Plan: Additional outreach attempts will be made to reach the patient to complete the Transitions of Care (Post Inpatient/ED visit) call.   Signature  Kandis Fantasia, LPN [facility] Advisor [place] [facility] You Are. We Are. [facility] Direct Dial [phone]

## 2023-05-05 ENCOUNTER — Telehealth: Payer: Self-pay

## 2023-05-05 NOTE — Progress Notes (Signed)
 Complex Care Management Care Guide Note  05/05/2023 Name: [name] MRN: [id number] DOB: [birth year]  Ellen [name] is a 89 y.o. year old female who is a primary care patient of Smitty Cords, DO and is actively engaged with the care management team. I reached out to [employer] by phone today to assist with re-scheduling  with the Pharmacist.  Follow up plan: Unsuccessful telephone outreach attempt made. A HIPAA compliant phone message was left for the patient providing contact information and requesting a return call.  Penne Lash , RMA     [facility]  [facility], [facility] Guide  Direct Dial: [phone]  Website: [email or web address]

## 2023-05-11 NOTE — Progress Notes (Signed)
 Complex Care Management Care Guide Note  05/11/2023 Name: Ellen [name] MRN: [id number] DOB: 02/19/1929  Ellen [name] is a 89 y.o. year old female who is a primary care patient of Smitty Cords, DO and is actively engaged with the care management team. I reached out to [employer] by phone today to assist with re-scheduling  with the Pharmacist.  Follow up plan: Unsuccessful telephone outreach attempt made. A HIPAA compliant phone message was left for the patient providing contact information and requesting a return call.  Penne Lash , RMA     [facility]  [facility], [facility] Guide  Direct Dial: [phone]  Website: [email or web address]

## 2023-06-01 NOTE — Progress Notes (Signed)
 Complex Care Management Care Guide Note  06/01/2023 Name: Ellen [name] MRN: [id number] DOB: [birth year]  Ellen [name] is a 89 y.o. year old female who is a primary care patient of Raina Bunting, DO and is actively engaged with the care management team. I reached out to [employer] by phone today to assist with re-scheduling  with the Pharmacist.  Follow up plan: Telephone appointment with complex care management team member scheduled for:  07/01/2023  Lenton Rail , RMA     [place]  [facility], [facility] Guide  Direct Dial: [phone]  Website: [email or web address]

## 2023-06-13 ENCOUNTER — Ambulatory Visit: Payer: Self-pay

## 2023-06-13 NOTE — Telephone Encounter (Signed)
 Copied from CRM [id number]. Topic: Clinical - Red Word Triage >> Jun 13, 2023 11:32 AM Essie A wrote: Reason for CRM: Saturday vomiting and diarrhea; Sunday okay after drinking ginger ale; vomiting and diarrhea with cramps that feels like labor pain   Caller disconnected line before transfer of call.Called back, call unable to be completed.

## 2023-06-13 NOTE — Telephone Encounter (Signed)
 Chief Complaint: vomiting and diarrhea  Disposition: [] ED /[] Urgent Care (no appt availability in office) / [x] Appointment(In office/virtual)/ []  [place] [facility]/ [] Home Care/ [] Refused Recommended Disposition /[] [place] [facility]/ []  Follow-up with PCP Additional Notes: Daughter [name] called with concerns of vomiting and diarrhea on and off over weekend. Pt started with stomach cramps after dinner Friday. Pt vomited and had diarrhea several times that evening but was fine Saturday and Sunday. Pt had eggs/toast/sausage this morning and the vomiting and diarrhea returned. Pt has vomited twice. BMs are loose. BP Saturday was 180/80 and Sunday was 119/72. [name] is on the way to pick pt up and bring back to her house to monitor. [name] mentioned pt has stomach cramps. Pt has soonest appt for 5/14. RN gave care advice and signs for dehydration to monitor and when to seek immediate care.  [name] verbalized understanding.            Copied from CRM [id number]. Topic: Clinical - Red Word Triage >> Jun 13, 2023 11:25 AM Essie A wrote: Red Word that prompted transfer to Nurse Triage: On Saturday patient had vomiting and diarrhea; Sunday she was okay after her daughter gave her ginger ale: today she's vomiting with diarrhea with cramps that feels like labor pain. Reason for Disposition  [1] Mild diarrhea (e.g., 1-3 or more stools than normal in past 24 hours) without known cause AND [2] present >  7 days  Answer Assessment - Initial Assessment Questions 1. DIARRHEA SEVERITY: "How bad is the diarrhea?" "How many more stools have you had in the past 24 hours than normal?"    - NO DIARRHEA (SCALE 0)   - MILD (SCALE 1-3): Few loose or mushy BMs; increase of 1-3 stools over normal daily number of stools; mild increase in ostomy output.   -  MODERATE (SCALE 4-7): Increase of 4-6 stools daily over normal; moderate increase in ostomy output.   -  SEVERE (SCALE 8-10; OR "WORST POSSIBLE"): Increase of  7 or more stools daily over normal; moderate increase in ostomy output; incontinence.     Not sure  2. ONSET: "When did the diarrhea begin?"      Friday evening  3. BM CONSISTENCY: "How loose or watery is the diarrhea?"      Looks like what she ate 4. VOMITING: "Are you also vomiting?" If Yes, ask: "How many times in the past 24 hours?"      2x  5. ABDOMEN PAIN: "Are you having any abdomen pain?" If Yes, ask: "What does it feel like?" (e.g., crampy, dull, intermittent, constant)      Not sure 6. ABDOMEN PAIN SEVERITY: If present, ask: "How bad is the pain?"  (e.g., Scale 1-10; mild, moderate, or severe)   - MILD (1-3): doesn't interfere with normal activities, abdomen soft and not tender to touch    - MODERATE (4-7): interferes with normal activities or awakens from sleep, abdomen tender to touch    - SEVERE (8-10): excruciating pain, doubled over, unable to do any normal activities       Not sure  7. ORAL INTAKE: If vomiting, "Have you been able to drink liquids?" "How much liquids have you had in the past 24 hours?"     Not sure  8. HYDRATION: "Any signs of dehydration?" (e.g., dry mouth [not just dry lips], too weak to stand, dizziness, new weight loss) "When did you last urinate?"     Not sure  9. EXPOSURE: "Have you traveled to a foreign  country recently?" "Have you been exposed to anyone with diarrhea?" "Could you have eaten any food that was spoiled?"     [place]   11. OTHER SYMPTOMS: "Do you have any other symptoms?" (e.g., fever, blood in stool)       Denies  Protocols used: Diarrhea-A-AH

## 2023-06-13 NOTE — Telephone Encounter (Addendum)
 Copied from CRM [id number]. Topic: Clinical - Red Word Triage >> Jun 13, 2023 11:32 AM Essie A wrote: Reason for CRM: Saturday vomiting and diarrhea; Sunday okay after drinking ginger ale; vomiting and diarrhea with cramps that feels like labor pain     Caller disconnected line before transfer of call.Called back, call unable to be completed.  2nd attempt; call not able to be completed as dialed.  3rd attempt made; no answer; call cannot be completed as dialed; sent to pcp office.

## 2023-06-15 ENCOUNTER — Encounter: Payer: Self-pay | Admitting: Family Medicine

## 2023-06-15 ENCOUNTER — Ambulatory Visit (INDEPENDENT_AMBULATORY_CARE_PROVIDER_SITE_OTHER): Admitting: Family Medicine

## 2023-06-15 VITALS — BP 140/78 | HR 61 | Ht 62.0 in | Wt 128.2 lb

## 2023-06-15 DIAGNOSIS — R519 Headache, unspecified: Secondary | ICD-10-CM | POA: Diagnosis not present

## 2023-06-15 DIAGNOSIS — G8929 Other chronic pain: Secondary | ICD-10-CM | POA: Diagnosis not present

## 2023-06-15 DIAGNOSIS — A084 Viral intestinal infection, unspecified: Secondary | ICD-10-CM

## 2023-06-15 DIAGNOSIS — R109 Unspecified abdominal pain: Secondary | ICD-10-CM

## 2023-06-15 DIAGNOSIS — R112 Nausea with vomiting, unspecified: Secondary | ICD-10-CM | POA: Diagnosis not present

## 2023-06-15 DIAGNOSIS — M542 Cervicalgia: Secondary | ICD-10-CM

## 2023-06-15 DIAGNOSIS — M5030 Other cervical disc degeneration, unspecified cervical region: Secondary | ICD-10-CM

## 2023-06-15 MED ORDER — DICYCLOMINE HCL 10 MG PO CAPS
10.0000 mg | ORAL_CAPSULE | Freq: Three times a day (TID) | ORAL | 0 refills | Status: AC
Start: 2023-06-15 — End: ?

## 2023-06-15 MED ORDER — ONDANSETRON 4 MG PO TBDP
4.0000 mg | ORAL_TABLET | Freq: Three times a day (TID) | ORAL | 0 refills | Status: AC | PRN
Start: 2023-06-15 — End: ?

## 2023-06-15 MED ORDER — TRAMADOL HCL 50 MG PO TABS
50.0000 mg | ORAL_TABLET | Freq: Four times a day (QID) | ORAL | 0 refills | Status: AC | PRN
Start: 2023-06-15 — End: 2023-06-20

## 2023-06-15 NOTE — Progress Notes (Signed)
 Subjective:    Patient ID: Ellen [name], female    DOB: March 19, 1929, 89 y.o.   MRN: [id number]  Ellen [name] is a 89 y.o. female presenting on 06/15/2023 for Gastroenteritis, Nausea Vomiting Diarrhea   HPI  Discussed the use of AI scribe software for clinical note transcription with the patient, who gave verbal consent to proceed.  History of Present Illness   Ellen [name] is a 89 year old female who presents with gastrointestinal symptoms including nausea, vomiting, and diarrhea. She is accompanied by her daughter.  She experienced nausea, vomiting, and diarrhea starting over the weekend. By Sunday, she was able to tolerate some ginger ale, but symptoms persisted with cramping pains. On Monday, symptoms worsened again, prompting her to make an appointment. By Tuesday, bowel movements returned to normal, and by Wednesday, symptoms had eased significantly.  She has not taken any over-the-counter or prescription medications for nausea, diarrhea, or cramping. She has previously used Zofran  for nausea, which was administered in the ER several years ago. Her diet has been limited to liquids, but she has started to reintroduce solid foods, such as bananas and a meal on Tuesday, which did not exacerbate her symptoms.  No blood in stools and no measured temperature, but no fevers reported. She was in [place]  for a funeral on Saturday when symptoms began, and her family suspected a stomach virus.  She reports chronic pain starting from her neck, radiating down to her hip and big toe. She has been taking baclofen  for this pain, but it has not been effective. She previously tried gabapentin  but experienced side effects. She has been using Tylenol  500 mg, two tablets at a time, for pain relief. She has a history of using tramadol  for pain, which she found effective, but it was discontinued in favor of Tylenol .         06/15/2023    2:21 PM 02/18/2023    3:06 PM 12/15/2022   10:49 AM  Depression  screen PHQ 2/9  Decreased Interest 0 0 0  Down, Depressed, Hopeless 0 0 2  PHQ - 2 Score 0 0 2  Altered sleeping 0 1 0  Tired, decreased energy 0 1 1  Change in appetite 1 0 0  Feeling bad or failure about yourself  0 0 0  Trouble concentrating 0 0 1  Moving slowly or fidgety/restless 0 0 0  Suicidal thoughts 0 0 0  PHQ-9 Score 1 2 4   Difficult doing work/chores Somewhat difficult Somewhat difficult        06/15/2023    2:21 PM 02/18/2023    3:07 PM 12/15/2022   10:49 AM 02/16/2021    9:50 AM  GAD 7 : Generalized Anxiety Score  Nervous, Anxious, on Edge 0 0 0 0  Control/stop worrying 0 0 0 0  Worry too much - different things 0 0 0 0  Trouble relaxing 0 0 0 0  Restless 0 0 0 0  Easily annoyed or irritable 0 0 0 0  Afraid - awful might happen 0 0 0 0  Total GAD 7 Score 0 0 0 0  Anxiety Difficulty Not difficult at all Not difficult at all  Not difficult at all    Social History   Tobacco Use   Smoking status: Never   Smokeless tobacco: Never  Vaping Use   Vaping status: Never Used  Substance Use Topics   Alcohol use: Never   Drug use: Never    Review of Systems Per HPI unless  specifically indicated above     Objective:     BP (!) 140/78 (BP Location: Left Arm, Cuff Size: Normal)   Pulse 61   Ht 5\' 2"  (1.575 m)   Wt 128 lb 4 oz (58.2 kg)   SpO2 98%   BMI 23.46 kg/m   Wt Readings from Last 3 Encounters:  06/15/23 128 lb 4 oz (58.2 kg)  03/17/23 125 lb (56.7 kg)  02/18/23 126 lb (57.2 kg)    Physical Exam Vitals and nursing note reviewed.  Constitutional:      General: She is not in acute distress.    Appearance: Normal appearance. She is well-developed. She is not diaphoretic.     Comments: Well-appearing, comfortable, cooperative  HENT:     Head: Normocephalic and atraumatic.  Eyes:     General:        Right eye: No discharge.        Left eye: No discharge.     Conjunctiva/sclera: Conjunctivae normal.  Neck:     Comments: R neck muscle  hypertonicity spasm Cardiovascular:     Rate and Rhythm: Normal rate.  Pulmonary:     Effort: Pulmonary effort is normal.  Skin:    General: Skin is warm and dry.     Findings: No erythema or rash.  Neurological:     Mental Status: She is alert and oriented to person, place, and time.  Psychiatric:        Mood and Affect: Mood normal.        Behavior: Behavior normal.        Thought Content: Thought content normal.     Comments: Well groomed, good eye contact, normal speech and thoughts     Results for orders placed or performed during the hospital encounter of 12/21/22  CBC with Differential   Collection Time: 12/21/22 11:28 AM  Result Value Ref Range   WBC 4.8 4.0 - 10.5 K/uL   RBC 3.73 (L) 3.87 - 5.11 MIL/uL   Hemoglobin 10.5 (L) 12.0 - 15.0 g/dL   HCT 40.9 (L) 81.1 - 91.4 %   MCV 87.4 80.0 - 100.0 fL   MCH 28.2 26.0 - 34.0 pg   MCHC 32.2 30.0 - 36.0 g/dL   RDW 78.2 (H) 95.6 - 21.3 %   Platelets 270 150 - 400 K/uL   nRBC 0.0 0.0 - 0.2 %   Neutrophils Relative % 45 %   Neutro Abs 2.1 1.7 - 7.7 K/uL   Lymphocytes Relative 44 %   Lymphs Abs 2.1 0.7 - 4.0 K/uL   Monocytes Relative 7 %   Monocytes Absolute 0.3 0.1 - 1.0 K/uL   Eosinophils Relative 3 %   Eosinophils Absolute 0.1 0.0 - 0.5 K/uL   Basophils Relative 1 %   Basophils Absolute 0.0 0.0 - 0.1 K/uL   Immature Granulocytes 0 %   Abs Immature Granulocytes 0.00 0.00 - 0.07 K/uL  Basic metabolic panel   Collection Time: 12/21/22 11:28 AM  Result Value Ref Range   Sodium 138 135 - 145 mmol/L   Potassium 4.1 3.5 - 5.1 mmol/L   Chloride 104 98 - 111 mmol/L   CO2 28 22 - 32 mmol/L   Glucose, Bld 97 70 - 99 mg/dL   BUN 22 8 - 23 mg/dL   Creatinine, Ser 0.86 (H) 0.44 - 1.00 mg/dL   Calcium  9.2 8.9 - 10.3 mg/dL   GFR, Estimated 48 (L) >60 mL/min   Anion gap 6 5 - 15  Assessment & Plan:   Problem List Items Addressed This Visit     Chronic headaches   Relevant Medications   traMADol  (ULTRAM ) 50 MG tablet    Other Visit Diagnoses       Viral gastroenteritis    -  Primary   Relevant Medications   ondansetron  (ZOFRAN -ODT) 4 MG disintegrating tablet   dicyclomine  (BENTYL ) 10 MG capsule     Nausea and vomiting, unspecified vomiting type       Relevant Medications   ondansetron  (ZOFRAN -ODT) 4 MG disintegrating tablet     Abdominal cramping       Relevant Medications   dicyclomine  (BENTYL ) 10 MG capsule     Chronic neck pain       Relevant Medications   traMADol  (ULTRAM ) 50 MG tablet     DDD (degenerative disc disease), cervical       Relevant Medications   traMADol  (ULTRAM ) 50 MG tablet        Acute gastroenteritis Likely viral gastroenteritis with nausea, vomiting, diarrhea, and cramping. No blood in stools or fever.  Symptoms currently resolved now, self limited process  Emphasized hydration and symptom management. - Suggested peppermint oil capsules for gastrointestinal symptoms prevention and management  Will order medications as precaution in case symptoms return or in future - Prescribed Zofran  for nausea management. - Recommended over-the-counter Imodium AD for severe diarrhea. - Prescribed dicyclomine  for cramping.  Chronic neck and lower back pain Chronic severe nerve-related pain from neck to lower back and big toe. Baclofen  ineffective. Discussed tramadol  as a stronger option with caution due to its narcotic nature. - Discontinued baclofen . - She tolerated Tramadol  in the past but has run out, interested in this again to manage pain. - Prescribed tramadol  50 mg as needed, five-day supply. She is to take it up to max 2 times a day, and notify us  if it is helpful in managing her pain. We have discussed in past that unlikely to resolve a lot of her chronic pain generators including neck and shoulder but goal is to manage symptoms - Advised continuation of Tylenol  with tramadol . - Consider other muscle relaxant in future if interested such as tizanidine or  methocarbamol    No orders of the defined types were placed in this encounter.   Meds ordered this encounter  Medications   ondansetron  (ZOFRAN -ODT) 4 MG disintegrating tablet    Sig: Take 1 tablet (4 mg total) by mouth every 8 (eight) hours as needed for nausea or vomiting.    Dispense:  30 tablet    Refill:  0   dicyclomine  (BENTYL ) 10 MG capsule    Sig: Take 1 capsule (10 mg total) by mouth 4 (four) times daily -  before meals and at bedtime. As needed for diarrhea, cramping    Dispense:  30 capsule    Refill:  0   traMADol  (ULTRAM ) 50 MG tablet    Sig: Take 1 tablet (50 mg total) by mouth every 6 (six) hours as needed for up to 5 days.    Dispense:  20 tablet    Refill:  0    Follow up plan: Return if symptoms worsen or fail to improve.  Domingo Friend, DO [facility] 06/15/2023, 2:40 PM

## 2023-06-15 NOTE — Patient Instructions (Addendum)
 Thank you for coming to the office today.  Tramadol  for pain, take one up to twice a day for neck or arm pain.  Discontinue Baclofen  for now since not effective. We can switch it in future.  Zofran  dissolving tab as needed for nausea vomiting  IBGard OTC Peppermint Oil (Triple Coated Capsule) 180mg  take one 3 times daily to reduce diarrhea   AS NEEDED - with meal and bedtime, Rx Dicyclomine  for abdominal urgency and cramping diarrhea  Viral Gastroenteritis, Adult  Viral gastroenteritis is also known as the stomach flu. This condition may affect your stomach, small intestine, and large intestine. It can cause sudden watery diarrhea, fever, and vomiting. This condition is caused by many different viruses. These viruses can be passed from person to person very easily (are contagious). Diarrhea and vomiting can make you feel weak and cause you to become dehydrated. You may not be able to keep fluids down. Dehydration can make you tired and thirsty, cause you to have a dry mouth, and decrease how often you urinate. It is important to replace the fluids that you lose from diarrhea and vomiting. What are the causes? Gastroenteritis is caused by many viruses, including rotavirus and norovirus. Norovirus is the most common cause in adults. You can get sick after being exposed to the viruses from other people. You can also get sick by: Eating food, drinking water, or touching a surface contaminated with one of these viruses. Sharing utensils or other personal items with an infected person. What increases the risk? You are more likely to develop this condition if you: Have a weak body defense system (immune system). Live with one or more children who are younger than 2 years. Live in a nursing home. Travel on cruise ships. What are the signs or symptoms? Symptoms of this condition start suddenly 1-3 days after exposure to a virus. Symptoms may last for a few days or for as long as a week. Common  symptoms include watery diarrhea and vomiting. Other symptoms include: Fever. Headache. Fatigue. Pain in the abdomen. Chills. Weakness. Nausea. Muscle aches. Loss of appetite. How is this diagnosed? This condition is diagnosed with a medical history and physical exam. You may also have a stool test to check for viruses or other infections. How is this treated? This condition typically goes away on its own. The focus of treatment is to prevent dehydration and restore lost fluids (rehydration). This condition may be treated with: An oral rehydration solution (ORS) to replace important salts and minerals (electrolytes) in your body. Take this if told by your health care provider. This is a drink that is sold at pharmacies and retail stores. Medicines to help with your symptoms. Probiotic supplements to reduce symptoms of diarrhea. Fluids given through an IV, if dehydration is severe. Older adults and people with other diseases or a weak immune system are at higher risk for dehydration. Follow these instructions at home: Eating and drinking  Take an ORS as told by your health care provider. Drink clear fluids in small amounts as you are able. Clear fluids include: Water. Ice chips. Diluted fruit juice. Low-calorie sports drinks. Drink enough fluid to keep your urine pale yellow. Eat small amounts of healthy foods every 3-4 hours as you are able. This may include whole grains, fruits, vegetables, lean meats, and yogurt. Avoid fluids that contain a lot of sugar or caffeine, such as energy drinks, sports drinks, and soda. Avoid spicy or fatty foods. Avoid alcohol. General instructions  Wash your  hands often, especially after having diarrhea or vomiting. If soap and water are not available, use hand sanitizer. Make sure that all people in your household wash their hands well and often. Take over-the-counter and prescription medicines only as told by your health care provider. Rest at  home while you recover. Watch your condition for any changes. Take a warm bath to relieve any burning or pain from frequent diarrhea episodes. Keep all follow-up visits. This is important. Contact a health care provider if you: Cannot keep fluids down. Have symptoms that get worse. Have new symptoms. Feel light-headed or dizzy. Have muscle cramps. Get help right away if you: Have chest pain. Have trouble breathing or you are breathing very quickly. Have a fast heartbeat. Feel extremely weak or you faint. Have a severe headache, a stiff neck, or both. Have a rash. Have severe pain, cramping, or bloating in your abdomen. Have skin that feels cold and clammy. Feel confused. Have pain when you urinate. Have signs of dehydration, such as: Dark urine, very little urine, or no urine. Cracked lips. Dry mouth. Sunken eyes. Sleepiness. Weakness. Have signs of bleeding, such as: Seeing blood in your vomit. Having vomit that looks like coffee grounds. Having bloody or black stools or stools that look like tar. These symptoms may be an emergency. Get help right away. Call 911. Do not wait to see if the symptoms will go away. Do not drive yourself to the hospital. Summary Viral gastroenteritis is also known as the stomach flu. It can cause sudden watery diarrhea, fever, and vomiting. This condition can be passed from person to person very easily (is contagious). Take an oral rehydration solution (ORS) if told by your health care provider. This is a drink that is sold at pharmacies and retail stores. Wash your hands often, especially after having diarrhea or vomiting. If soap and water are not available, use hand sanitizer. This information is not intended to replace advice given to you by your health care provider. Make sure you discuss any questions you have with your health care provider. Document Revised: 11/17/2020 Document Reviewed: 11/17/2020 Elsevier Patient Education  2024  [employer]vier [employer]  Please schedule a Follow-up Appointment to: Return if symptoms worsen or fail to improve.  If you have any other questions or concerns, please feel free to call the office or send a message through MyChart. You may also schedule an earlier appointment if necessary.  Additionally, you may be receiving a survey about your experience at our office within a few days to 1 week by e-mail or mail. We value your feedback.  Domingo Friend, DO [facility], [place]

## 2023-07-01 ENCOUNTER — Other Ambulatory Visit: Admitting: Pharmacist

## 2023-07-01 ENCOUNTER — Telehealth: Payer: Self-pay

## 2023-07-01 ENCOUNTER — Other Ambulatory Visit: Payer: Self-pay | Admitting: Family Medicine

## 2023-07-01 DIAGNOSIS — I10 Essential (primary) hypertension: Secondary | ICD-10-CM

## 2023-07-01 NOTE — Telephone Encounter (Signed)
 She has been seen recently. I will complete the Handicap placard form when I return on Monday 6/2 and they can be notified to pick up from office anytime next week  Domingo Friend, DO [facility] 07/01/2023, 3:45 PM

## 2023-07-01 NOTE — Progress Notes (Signed)
 07/01/2023 Name: Ellen [name] MRN: [id number] DOB: [birth year]  Chief Complaint  Patient presents with   Medication Management    Glayds Insco is a 89 y.o. year old female who presented for a telephone visit.   They were referred to the pharmacist by their PCP for assistance in managing hypertension and medication adherence.    Speak with both patient and daughter, [name], by telephone today   Subjective:   Care Team: Primary Care Provider: Raina Bunting, DO ; Next Scheduled Visit: 07/20/2023     Medication Access/Adherence  Current Pharmacy:  [id number] - [place], [place] - [address] [place] [address] [place] [address] [place] [address] Phone: [phone] Fax: [phone]   Patient reports affordability concerns with their medications: No  Patient reports access/transportation concerns to their pharmacy: No  Patient reports adherence concerns with their medications:  No   Reports using weekly pillbox and daughter supports    Note patient is currently staying with daughter   Hypertension:   Current medications:  - lisinopril -HCTZ 20-12.5 mg daily. Find did not increased to 2 tablets daily as directed by PCP on 03/17/2023) - amlodipine  10 mg daily - Find patient has not been taking, but caregiver unsure for how long  Per dispensing record, prescription last refilled 12/15/2022 for 90 day supply     Medications previously tried: clonidine     Admits to sometimes adding some salt to her food    Reports has an automated, upper arm home BP cuff Reports latest home BP readings: - Today: 170/75, HR 73 (right after coffee and prior to medication)    Denies symptoms of hypotension such as dizziness or lightheadedness   Current physical activity: stays active throughout the day   Objective:   Lab Results  Component Value Date   CREATININE 1.09 (H) 12/21/2022   BUN 22 12/21/2022   NA 138 12/21/2022   K 4.1  12/21/2022   CL 104 12/21/2022   CO2 28 12/21/2022    Lab Results  Component Value Date   CHOL 178 01/31/2022   HDL 56 01/31/2022   LDLCALC 113 (H) 01/31/2022   TRIG 45 01/31/2022   CHOLHDL 3.2 01/31/2022   BP Readings from Last 3 Encounters:  06/15/23 (!) 140/78  03/17/23 (!) 148/64  02/18/23 138/64   Pulse Readings from Last 3 Encounters:  06/15/23 61  03/17/23 60  02/18/23 74     Medications Reviewed Today     Reviewed by Ardis Becton, RPH-CPP (Pharmacist) on 07/01/23 at 1239  Med List Status: <None>   Medication Order Taking? Sig Documenting Provider Last Dose Status Informant  Accu-Chek Softclix Lancets lancets [id number]  Use to check blood sugar up to twice daily Raina Bunting, DO  Active Other  acetaminophen  (TYLENOL ) 500 MG tablet [id number]  Take 1,000 mg by mouth 2 (two) times daily as needed for mild pain (pain score 1-3) or moderate pain (pain score 4-6). [provider]  Active Other  amLODipine  (NORVASC ) 10 MG tablet [id number] No Take 1 tablet (10 mg total) by mouth daily.  Patient not taking: Reported on 07/01/2023   Raina Bunting, DO Not Taking Active   aspirin  81 MG chewable tablet [id number]  Chew 81 mg by mouth daily.   Patient not taking: Reported on 02/18/2023   [provider]  Active Self           Med Note Sulema Endo, ANA K   Wed Jun 07, 2018 11:10 AM)    atorvastatin  (LIPITOR) 40 MG tablet [id number]  Take 1 tablet (40 mg total) by mouth daily. Raina Bunting, DO  Active   Blood Glucose Monitoring Suppl (ACCU-CHEK GUIDE) w/Device Suzanne Erps [id number]  Use as directed to monitor blood sugar Raina Bunting, DO  Active Other  Blood Pressure Monitoring DEVI [id number]  Use to check BP daily Raina Bunting, DO  Active   clopidogrel  (PLAVIX ) 75 MG tablet [id number]  Take 1 tablet (75 mg total) by mouth daily. Raina Bunting, DO  Active   dicyclomine  (BENTYL ) 10 MG capsule [id number]  Take  1 capsule (10 mg total) by mouth 4 (four) times daily -  before meals and at bedtime. As needed for diarrhea, cramping Raina Bunting, DO  Active   glucose blood (ACCU-CHEK GUIDE) test strip [id number]  Use to check blood sugar up to twice daily Raina Bunting, DO  Active Other  Lancets [facility] ULTRASOFT) lancets [id number]  Use to check blood sugar daily, up to 2 times if need Raina Bunting, DO  Active Other  lisinopril -hydrochlorothiazide  (ZESTORETIC ) 20-12.5 MG tablet [id number] Yes Take 1 tablet by mouth daily. Raina Bunting, DO Taking Active   mirtazapine  (REMERON ) 15 MG tablet [id number]  Take 1 tablet (15 mg total) by mouth at bedtime. Raina Bunting, DO  Active   Multiple Vitamins-Minerals (CERTAVITE SENIOR/ANTIOXIDANT) TABS [id number]  Take 1 tablet by mouth daily.  Patient not taking: Reported on 02/18/2023   [provider]  Active Self  ondansetron  (ZOFRAN -ODT) 4 MG disintegrating tablet [id number]  Take 1 tablet (4 mg total) by mouth every 8 (eight) hours as needed for nausea or vomiting. Raina Bunting, DO  Active               Assessment/Plan:   Encourage patient/caregiver to continue using weekly pillbox. Encourage caregiver to manage medications for patient and review weekly pillbox to monitor for missed doses. Encourage to use medication list from latest office visit with PCP as adherence aid  Hypertension: - Reviewed long term cardiovascular and renal outcomes of uncontrolled blood pressure - Caregiver plans to pick up amlodipine  10 mg prescription from pharmacy today and have patient restart taking, in addition to lisinopril -HCTZ 20-12.5 mg daily - Encourage patient to limit salt/sodium intake - Counsel on blood pressure monitoring technique for rested readings, including checking at least 30 minutes after caffeine and at least 1-2 hours after morning medications - Recommend to continue to monitor home  blood pressure, keep log of results and have this record to review at upcoming medical appointments. Patient to contact provider office sooner if needed for readings outside of established parameters or symptoms     Follow Up Plan: Clinical Pharmacist will follow up with patient and caregiver by telephone on 07/11/2023 at 2:30 PM    Arthur Lash, PharmD, Becky Bowels, CPP Clinical Pharmacist [facility] [phone]

## 2023-07-01 NOTE — Telephone Encounter (Signed)
 Will defer to PCP upon his return. ?

## 2023-07-01 NOTE — Patient Instructions (Signed)
 Goals Addressed             This Visit's Progress    Pharmacy Goals       Check your blood pressure once daily, and any time you have concerning symptoms like headache, chest pain, dizziness, shortness of breath, or vision changes.   Our goal is less than 130/80.  To appropriately check your blood pressure, make sure you do the following:  1) Avoid caffeine, exercise, or tobacco products for 30 minutes before checking. Empty your bladder. 2) Sit with your back supported in a flat-backed chair. Rest your arm on something flat (arm of the chair, table, etc). 3) Sit still with your feet flat on the floor, resting, for at least 5 minutes.  4) Check your blood pressure. Take 1-2 readings.  5) Write down these readings and bring with you to any provider appointments.  Bring your home blood pressure machine with you to a provider's office for accuracy comparison at least once a year.   Make sure you take your blood pressure medications before you come to any office visit, even if you were asked to fast for labs.   Feel free to call me with any questions or concerns. I look forward to our next call!   Estelle Grumbles, PharmD, Patsy Baltimore, CPP Clinical Pharmacist [facility] [phone]

## 2023-07-01 NOTE — Telephone Encounter (Signed)
 Copied from CRM [id number]. Topic: General - Other >> Jul 01, 2023 10:01 AM Sophia H wrote: Reason for CRM: Pt daughter Ricci Chambers is calling on behalf of the patient, states the patients handicap placard is expired and she is needing Dr. Linnell Richardson to fill out the form for her. Needs to know if an appointment is required or if she can just stop by the office with that paperwork and have it filled out for her mother. Please advise [name] # [phone]

## 2023-07-04 NOTE — Telephone Encounter (Signed)
 Left message for velma that handicap placard form is available for pick up

## 2023-07-04 NOTE — Telephone Encounter (Signed)
 Requested Prescriptions  Refused Prescriptions Disp Refills   amLODipine  (NORVASC ) 10 MG tablet [Pharmacy Med Name: AMLODIPINE  BESYLATE 10MG TABLETS] 90 tablet 3    Sig: TAKE 1 TABLET(10 MG) BY MOUTH DAILY     Cardiovascular: Calcium  Channel Blockers 2 Failed - 07/04/2023  9:23 AM      Failed - Last BP in normal range    BP Readings from Last 1 Encounters:  06/15/23 (!) 140/78         Passed - Last Heart Rate in normal range    Pulse Readings from Last 1 Encounters:  06/15/23 61         Passed - Valid encounter within last 6 months    Recent Outpatient Visits           2 weeks ago Viral gastroenteritis   [place] [facility] [place], Kayleen Party, DO   3 months ago Chronic right shoulder pain   [place] [facility] [place], Kayleen Party, DO       Future Appointments             In 2 weeks [place], Kayleen Party, DO [facility], [facility]

## 2023-07-11 ENCOUNTER — Other Ambulatory Visit (INDEPENDENT_AMBULATORY_CARE_PROVIDER_SITE_OTHER): Admitting: Pharmacist

## 2023-07-11 DIAGNOSIS — I10 Essential (primary) hypertension: Secondary | ICD-10-CM

## 2023-07-11 NOTE — Patient Instructions (Signed)
 Goals Addressed             This Visit's Progress    Pharmacy Goals       Check your blood pressure once daily, and any time you have concerning symptoms like headache, chest pain, dizziness, shortness of breath, or vision changes.   Our goal is less than 130/80.  To appropriately check your blood pressure, make sure you do the following:  1) Avoid caffeine, exercise, or tobacco products for 30 minutes before checking. Empty your bladder. 2) Sit with your back supported in a flat-backed chair. Rest your arm on something flat (arm of the chair, table, etc). 3) Sit still with your feet flat on the floor, resting, for at least 5 minutes.  4) Check your blood pressure. Take 1-2 readings.  5) Write down these readings and bring with you to any provider appointments.  Bring your home blood pressure machine with you to a provider's office for accuracy comparison at least once a year.   Make sure you take your blood pressure medications before you come to any office visit, even if you were asked to fast for labs.   Feel free to call me with any questions or concerns. I look forward to our next call!   Estelle Grumbles, PharmD, Patsy Baltimore, CPP Clinical Pharmacist [facility] [phone]

## 2023-07-11 NOTE — Progress Notes (Signed)
 07/11/2023 Name: Ellen [name] MRN: [id number] DOB: Jan 21, 1930  Chief Complaint  Patient presents with   Medication Management   Medication Adherence    Ellen [name] is a 89 y.o. year old female who presented for a telephone visit.   They were referred to the pharmacist by their PCP for assistance in managing hypertension and medication adherence.    Speak with patient and daughter, [name], by telephone today   Subjective:   Care Team: Primary Care Provider: Raina Bunting, DO ; Next Scheduled Visit: 07/20/2023  Medication Access/Adherence  Current Pharmacy:  [id number] - [place], [place] - [address] [address] [place] [address] [place] [address] Phone: [phone] Fax: [phone]   Patient reports affordability concerns with their medications: No  Patient reports access/transportation concerns to their pharmacy: No  Patient reports adherence concerns with their medications:  No   Reports using weekly pillbox and daughter supports    Note patient is currently staying with daughter   Hypertension:   Current medications:  - lisinopril -HCTZ 20-12.5 mg - 1 tablet daily - amlodipine  10 mg daily (Reports restarted taking on 6/1)   Medications previously tried: clonidine     Admits to sometimes adding some salt to her food    Reports has an automated, upper arm home BP cuff Reports latest home BP readings: - 6/7: 141/69, HR 80 - 6/8: 141/62, HR 75 - 6/9: 154/73, HR 84 *Reports has been checking before taking morning medications   Denies symptoms of hypotension such as dizziness or lightheadedness   Current physical activity: stays active throughout the day   Objective:  Lab Results  Component Value Date   CREATININE 1.09 (H) 12/21/2022   BUN 22 12/21/2022   NA 138 12/21/2022   K 4.1 12/21/2022   CL 104 12/21/2022   CO2 28 12/21/2022    Lab Results  Component Value Date   CHOL 178 01/31/2022    HDL 56 01/31/2022   LDLCALC 113 (H) 01/31/2022   TRIG 45 01/31/2022   CHOLHDL 3.2 01/31/2022   BP Readings from Last 3 Encounters:  06/15/23 (!) 140/78  03/17/23 (!) 148/64  02/18/23 138/64   Pulse Readings from Last 3 Encounters:  06/15/23 61  03/17/23 60  02/18/23 74     Medications Reviewed Today     Reviewed by Ardis Becton, RPH-CPP (Pharmacist) on 07/11/23 at 1449  Med List Status: <None>   Medication Order Taking? Sig Documenting Provider Last Dose Status Informant  Accu-Chek Softclix Lancets lancets [id number]  Use to check blood sugar up to twice daily Raina Bunting, DO  Active Other  acetaminophen  (TYLENOL ) 500 MG tablet [id number]  Take 1,000 mg by mouth 2 (two) times daily as needed for mild pain (pain score 1-3) or moderate pain (pain score 4-6). [provider]  Active Other  amLODipine  (NORVASC ) 10 MG tablet [id number] Yes Take 1 tablet (10 mg total) by mouth daily. Raina Bunting, DO Taking Active   aspirin  81 MG chewable tablet [id number]  Chew 81 mg by mouth daily.   Patient not taking: Reported on 02/18/2023   [provider]  Active Self           Med Note Sulema Endo, ANA K   Wed Jun 07, 2018 11:10 AM)    atorvastatin  (LIPITOR) 40 MG tablet [id number]  Take 1 tablet (40 mg total) by mouth daily. Karamalegos, Kayleen Party, DO  Active   Blood Glucose Monitoring Suppl (  ACCU-CHEK GUIDE) w/Device KIT [id number]  Use as directed to monitor blood sugar Raina Bunting, DO  Active Other  Blood Pressure Monitoring DEVI [id number]  Use to check BP daily Raina Bunting, DO  Active   clopidogrel  (PLAVIX ) 75 MG tablet [id number]  Take 1 tablet (75 mg total) by mouth daily. Raina Bunting, DO  Active   dicyclomine  (BENTYL ) 10 MG capsule [id number]  Take 1 capsule (10 mg total) by mouth 4 (four) times daily -  before meals and at bedtime. As needed for diarrhea, cramping Raina Bunting, DO  Active   glucose  blood (ACCU-CHEK GUIDE) test strip [id number]  Use to check blood sugar up to twice daily Raina Bunting, DO  Active Other  Lancets [facility] ULTRASOFT) lancets [id number]  Use to check blood sugar daily, up to 2 times if need Raina Bunting, DO  Active Other  lisinopril -hydrochlorothiazide  (ZESTORETIC ) 20-12.5 MG tablet [id number] Yes Take 1 tablet by mouth daily. Raina Bunting, DO Taking Active   mirtazapine  (REMERON ) 15 MG tablet [id number]  Take 1 tablet (15 mg total) by mouth at bedtime. Raina Bunting, DO  Active   Multiple Vitamins-Minerals (CERTAVITE SENIOR/ANTIOXIDANT) TABS [id number]  Take 1 tablet by mouth daily.  Patient not taking: Reported on 02/18/2023   [provider]  Active Self  ondansetron  (ZOFRAN -ODT) 4 MG disintegrating tablet [id number]  Take 1 tablet (4 mg total) by mouth every 8 (eight) hours as needed for nausea or vomiting. Raina Bunting, DO  Active               Assessment/Plan:   Encourage patient/caregiver to continue using weekly pillbox. Encourage caregiver to manage medications for patient and review weekly pillbox to monitor for missed doses. Encourage to use medication list from latest office visit with PCP as adherence aid   Hypertension: - Reviewed long term cardiovascular and renal outcomes of uncontrolled blood pressure - Encourage patient to limit salt/sodium intake - Counsel on blood pressure monitoring technique for rested readings, including checking at least 30 minutes after caffeine and at least 1-2 hours after morning medications - Recommend to continue to monitor home blood pressure, keep log of results and have this record to review at upcoming medical appointment with PCP. Patient to contact provider office sooner if needed for readings outside of established parameters or symptoms     Follow Up Plan: Clinical Pharmacist will follow up with patient and caregiver by telephone on  08/12/2023 at 11:30 AM    Arthur Lash, PharmD, Becky Bowels, CPP Clinical Pharmacist [facility] [place] [phone]

## 2023-07-20 ENCOUNTER — Ambulatory Visit: Payer: Medicare Other | Admitting: Family Medicine

## 2023-08-02 ENCOUNTER — Ambulatory Visit (INDEPENDENT_AMBULATORY_CARE_PROVIDER_SITE_OTHER): Admitting: Family Medicine

## 2023-08-02 ENCOUNTER — Encounter: Payer: Self-pay | Admitting: Family Medicine

## 2023-08-02 VITALS — BP 138/58 | HR 65 | Ht 62.0 in | Wt 123.0 lb

## 2023-08-02 DIAGNOSIS — I10 Essential (primary) hypertension: Secondary | ICD-10-CM | POA: Diagnosis not present

## 2023-08-02 DIAGNOSIS — F5104 Psychophysiologic insomnia: Secondary | ICD-10-CM | POA: Diagnosis not present

## 2023-08-02 MED ORDER — TRAZODONE HCL 50 MG PO TABS: 50.0000 mg | ORAL_TABLET | Freq: Every evening | ORAL | 2 refills | Status: DC | PRN

## 2023-08-02 MED ORDER — LISINOPRIL-HYDROCHLOROTHIAZIDE 20-12.5 MG PO TABS
1.0000 | ORAL_TABLET | Freq: Every day | ORAL | 1 refills | Status: DC
Start: 2023-08-02 — End: 2023-11-04

## 2023-08-02 NOTE — Patient Instructions (Addendum)
 Thank you for coming to the office today.  Keep on current medications  BP medications are both just ONE PER DAY Do not raise BP med yet  Lisinopril -hydrochlorothiazide  once daily Amlodipine  once daily  Atorvastatin  cholesterol daily Plavix  blood thinner once daily  ADDED NEW sleeping medication Trazodone 50mg  nightly for insomnia.  Stop taking Mirtazapine  (Remeron ) if you have it.   Please schedule a Follow-up Appointment to: Return in about 3 months (around 11/02/2023) for 3 month follow-up Hypertension, Insomnia, lab AFTER.  If you have any other questions or concerns, please feel free to call the office or send a message through MyChart. You may also schedule an earlier appointment if necessary.  Additionally, you may be receiving a survey about your experience at our office within a few days to 1 week by e-mail or mail. We value your feedback.  Marsa Officer, DO [facility] [place], [place]

## 2023-08-02 NOTE — Progress Notes (Unsigned)
 Subjective:    Patient ID: Ellen [name], [name]    DOB: 10/18/1929, 89 y.o.   MRN: [id number]  Ellen [name] is a 89 y.o. [name] presenting on 08/02/2023 for No chief complaint on file.   HPI  Discussed the use of AI scribe software for clinical note transcription with the patient, who gave verbal consent to proceed.  History of Present Illness   Ellen [name] is a 89 year old [name] who presents with fluctuating blood pressure readings.  Blood pressure fluctuations - Fluctuating blood pressure readings, with systolic values ranging from 117 to 166 mmHg and diastolic values from 58 to 68 mmHg - Today's readings: 166/68 mmHg in the morning, 142/58 mmHg later in the day - Blood pressure equipment may be old, potentially affecting accuracy - No headaches or other symptoms associated with elevated blood pressure  Orthostatic symptoms - Dizziness and 'wheeziness' upon standing - Requires holding onto objects to prevent falling - Needs to pause before walking after standing up to manage symptoms  Sleep disturbances - Difficulty maintaining sleep, waking at 1 or 2 AM and unable to return to sleep - Previously tried mirtazapine  for sleep, unclear effectiveness - Occasionally uses Z-Quil, uncertain about safety with current medications - Currently prescribed trazodone 50 mg at bedtime for insomnia  Medication regimen - Amlodipine  10 mg daily - Lisinopril -hydrochlorothiazide  20-12.5 mg daily, recently started - Amiodarone, one pill daily - Atorvastatin  at bedtime - Clopidogrel  (Plavix ) - Trazodone 50 mg at bedtime for insomnia       Ellen [name] has a history of complete loss of joint space in the right shoulder, described as 'bone on bone', leading to significant pain. An ultrasound-guided humeral joint injection was administered approximately one week ago, providing pain relief. Despite this, Ellen [name] continues to experience difficulty lifting her shoulder, describing it as a 'bad  shoulder'. - They referred her to PT but not scheduled yet   Ellen [name] experiences persistent neck pain, particularly on the right side, extending from behind the ear downwards. This pain is associated with muscle spasms, and Ellen [name] has difficulty turning her neck, sometimes needing to turn her whole body. Ellen [name] previously used baclofen  for muscle spasms but has run out and did not call for a refill.   Ellen [name] monitors her blood pressure every other day, with readings as high as 195/78. Her blood pressure fluctuates, often increasing on painful days or after consuming salty foods or coffee. Home readings and office readings often average 140s/80s or 150s/90s - On Amlodipine  10mg  daily, lisinopril -hydrochlorothiazide  20-12.5mg  daily - Admits urinary frequency on diuretic   Ellen [name] is currently on four medications, including two for blood pressure, and experiences frequent urination, which disrupts her sleep.   Ellen [name] mentions issues with her eyes, describing them as 'blurry and teary all the time'. Ellen [name] does not have an eye doctor currently and is seeking assistance in setting up an appointment with one in [place].    *** Dose inc hydrochlorothiazide -Lisn to 2 x tabs DAILY  ***   Health Maintenance: ***     08/02/2023    3:28 PM 06/15/2023    2:21 PM 02/18/2023    3:06 PM  Depression screen PHQ 2/9  Decreased Interest 0 0 0  Down, Depressed, Hopeless 0 0 0  PHQ - 2 Score 0 0 0  Altered sleeping 0 0 1  Tired, decreased energy 0 0 1  Change in appetite 0 1 0  Feeling bad or failure about yourself  0 0 0  Trouble concentrating 0  0 0  Moving slowly or fidgety/restless 0 0 0  Suicidal thoughts 0 0 0  PHQ-9 Score 0 1 2  Difficult doing work/chores Not difficult at all Somewhat difficult Somewhat difficult       08/02/2023    3:28 PM 06/15/2023    2:21 PM 02/18/2023    3:07 PM 12/15/2022   10:49 AM  GAD 7 : Generalized Anxiety Score  Nervous, Anxious, on Edge 0 0 0 0  Control/stop worrying 0 0 0 0   Worry too much - different things 0 0 0 0  Trouble relaxing 0 0 0 0  Restless 0 0 0 0  Easily annoyed or irritable 0 0 0 0  Afraid - awful might happen 0 0 0 0  Total GAD 7 Score 0 0 0 0  Anxiety Difficulty Not difficult at all Not difficult at all Not difficult at all     Social History   Tobacco Use  . Smoking status: Never  . Smokeless tobacco: Never  Vaping Use  . Vaping status: Never Used  Substance Use Topics  . Alcohol use: Never  . Drug use: Never    Review of Systems Per HPI unless specifically indicated above     Objective:    BP (!) 138/58 (BP Location: Left Arm, Cuff Size: Normal)   Pulse 65   Ht 5' 2 (1.575 m)   Wt 123 lb (55.8 kg)   SpO2 100%   BMI 22.50 kg/m   Wt Readings from Last 3 Encounters:  08/02/23 123 lb (55.8 kg)  06/15/23 128 lb 4 oz (58.2 kg)  03/17/23 125 lb (56.7 kg)    Physical Exam Vitals and nursing note reviewed.  Constitutional:      General: Ellen [name] is not in acute distress.    Appearance: Normal appearance. Ellen [name] is well-developed. Ellen [name] is not diaphoretic.     Comments: Ellen elderly 89 yr [name] comfortable today  HENT:     Head: Normocephalic and atraumatic.  Eyes:     General:        Right eye: No discharge.        Left eye: No discharge.     Conjunctiva/sclera: Conjunctivae normal.  Cardiovascular:     Rate and Rhythm: Normal rate.  Pulmonary:     Effort: Pulmonary effort is normal.  Musculoskeletal:     Comments: Unable to lift R shoulder significantly without assistance, unable to fully test rotator cuff today due to pain and weakness.  Skin:    General: Skin is warm and dry.     Findings: No erythema or rash.  Neurological:     Mental Status: Ellen [name] is alert and oriented to person, place, and time.  Psychiatric:        Mood and Affect: Mood normal.        Behavior: Behavior normal.        Thought Content: Thought content normal.     Comments: Well groomed, good eye contact, normal speech and thoughts     Results  for orders placed or performed during the hospital encounter of 12/21/22  CBC with Differential   Collection Time: 12/21/22 11:28 AM  Result Value Ref Range   WBC 4.8 4.0 - 10.5 K/uL   RBC 3.73 (L) 3.87 - 5.11 MIL/uL   Hemoglobin 10.5 (L) 12.0 - 15.0 g/dL   HCT 67.3 (L) 63.9 - 53.9 %   MCV 87.4 80.0 - 100.0 fL   MCH 28.2 26.0 - 34.0 pg  MCHC 32.2 30.0 - 36.0 g/dL   RDW 83.4 (H) 88.4 - 84.4 %   Platelets 270 150 - 400 K/uL   nRBC 0.0 0.0 - 0.2 %   Neutrophils Relative % 45 %   Neutro Abs 2.1 1.7 - 7.7 K/uL   Lymphocytes Relative 44 %   Lymphs Abs 2.1 0.7 - 4.0 K/uL   Monocytes Relative 7 %   Monocytes Absolute 0.3 0.1 - 1.0 K/uL   Eosinophils Relative 3 %   Eosinophils Absolute 0.1 0.0 - 0.5 K/uL   Basophils Relative 1 %   Basophils Absolute 0.0 0.0 - 0.1 K/uL   Immature Granulocytes 0 %   Abs Immature Granulocytes 0.00 0.00 - 0.07 K/uL  Basic metabolic panel   Collection Time: 12/21/22 11:28 AM  Result Value Ref Range   Sodium 138 135 - 145 mmol/L   Potassium 4.1 3.5 - 5.1 mmol/L   Chloride 104 98 - 111 mmol/L   CO2 28 22 - 32 mmol/L   Glucose, Bld 97 70 - 99 mg/dL   BUN 22 8 - 23 mg/dL   Creatinine, Ser 8.90 (H) 0.44 - 1.00 mg/dL   Calcium  9.2 8.9 - 10.3 mg/dL   GFR, Estimated 48 (L) >60 mL/min   Anion gap 6 5 - 15      Assessment & Plan:   Problem List Items Addressed This Visit     Essential hypertension - Primary   Relevant Medications   lisinopril -hydrochlorothiazide  (ZESTORETIC ) 20-12.5 MG tablet   Other Visit Diagnoses       Psychophysiological insomnia       Relevant Medications   traZODone (DESYREL) 50 MG tablet         Hypertension Blood pressure mostly stable in 130s/140s over 60s. Occasional higher readings not an emergency. Current medications effective. No increase in lisinopril  due to stability and potential side effects. Emphasized updated equipment for accurate readings. - Continue amlodipine  10 mg daily and lisinopril -hydrochlorothiazide   20-12.5 mg daily. - Ensure blood pressure equipment is updated for accurate readings. - Send prescription for future refills of lisinopril -hydrochlorothiazide .  Orthostatic Hypotension Dizziness and lightheadedness upon standing due to blood pressure changes. Managed with behavioral strategies. - Advise standing up slowly and pausing before walking to manage symptoms.  Insomnia Difficulty sleeping, waking early, and restlessness. Mirtazapine  ineffective. Trazodone chosen for sleep efficacy and safety over Z-Quil. - Prescribe trazodone 50 mg at bedtime. - Discontinue mirtazapine  if still available. - Advise against using Z-Quil due to potential for confusion and side effects.        No orders of the defined types were placed in this encounter.   Meds ordered this encounter  Medications  . traZODone (DESYREL) 50 MG tablet    Sig: Take 1 tablet (50 mg total) by mouth at bedtime as needed for sleep.    Dispense:  30 tablet    Refill:  2  . lisinopril -hydrochlorothiazide  (ZESTORETIC ) 20-12.5 MG tablet    Sig: Take 1 tablet by mouth daily.    Dispense:  90 tablet    Refill:  1    Future refills    Follow up plan: Return in about 3 months (around 11/02/2023) for 3 month follow-up Hypertension, Insomnia, lab AFTER.   Marsa Officer, DO [facility] 08/02/2023, 3:54 PM

## 2023-08-12 ENCOUNTER — Telehealth: Payer: Self-pay | Admitting: Pharmacist

## 2023-08-12 ENCOUNTER — Other Ambulatory Visit

## 2023-08-12 ENCOUNTER — Telehealth: Payer: Self-pay

## 2023-08-12 NOTE — Progress Notes (Signed)
   Outreach Note  08/12/2023 Name: [name] MRN: [id number] DOB: 1929-04-04  Referred by: Edman Marsa PARAS, DO  Was unable to reach patient via telephone today and unable to leave a message as voicemail box is not setup.   Follow Up Plan: Will attempt to reach patient/caregiver by telephone again within the next 30 days  Sharyle Sia, PharmD, JAQUELINE, CPP Clinical Pharmacist [facility] [phone]

## 2023-08-22 ENCOUNTER — Other Ambulatory Visit

## 2023-08-22 ENCOUNTER — Telehealth: Payer: Self-pay | Admitting: Pharmacist

## 2023-08-22 NOTE — Progress Notes (Signed)
   Outreach Note  08/22/2023 Name: Ellen [name] MRN: [id number] DOB: 1929-05-25  Referred by: Edman Marsa PARAS, DO  Was unable to reach patient via telephone today and have left HIPAA compliant voicemail asking patient to return my call.    Follow Up Plan: Will attempt to reach patient/caregiver by telephone again within the next 30 days   Sharyle Sia, PharmD, JAQUELINE, CPP Clinical Pharmacist [facility] [phone]

## 2023-09-21 ENCOUNTER — Ambulatory Visit: Admitting: Family Medicine

## 2023-09-23 ENCOUNTER — Telehealth: Payer: Self-pay | Admitting: Pharmacist

## 2023-09-23 ENCOUNTER — Other Ambulatory Visit

## 2023-09-23 NOTE — Progress Notes (Signed)
   Outreach Note  09/23/2023 Name: Ellen [name] MRN: [id number] DOB: 26-Nov-1929  Referred by: Edman Marsa PARAS, DO   Third unsuccessful telephone outreach was attempted today to contact the patient about Care Management needs.Will collaborate with RNCM who has an upcoming appointment scheduled with patient.  Sharyle Sia, PharmD, JAQUELINE, CPP Clinical Pharmacist [facility] [phone]

## 2023-10-12 ENCOUNTER — Other Ambulatory Visit: Payer: Self-pay

## 2023-10-12 NOTE — Patient Outreach (Unsigned)
 Complex Care Management   Visit Note  10/12/2023  Name:  Ellen [name] MRN: [id number] DOB: [birth year]  Situation: Referral received for Complex Care Management related to {Criteria:32550} I obtained verbal consent from {CHL AMB Patient/Caregiver:28184}.  Visit completed with {CHL AMB Patient/Caregiver:28184}  {VISIT LOCATION:32553}  Background:   Past Medical History:  Diagnosis Date   Arthritis    Cancer (HCC) 01/2018   left breast    Cataract    Chronic headaches    CKD (chronic kidney disease)    Diabetes mellitus without complication (HCC)    Diverticulitis    Family history of ovarian cancer    History of syncope    Hyperlipidemia    Hypertension    Multinodular goiter    Personal history of radiation therapy    Primary osteoarthritis of left knee    Pulmonary hypertension (HCC)     Assessment: Patient Reported Symptoms:  Cognitive        Neurological      HEENT        Cardiovascular      Respiratory      Endocrine      Gastrointestinal        Genitourinary      Integumentary      Musculoskeletal          Psychosocial       Quality of Family Relationships: supportive Do you feel physically threatened by others?: No    10/12/2023    PHQ2-9 Depression Screening   Little interest or pleasure in doing things    Feeling down, depressed, or hopeless    PHQ-2 - Total Score    Trouble falling or staying asleep, or sleeping too much    Feeling tired or having little energy    Poor appetite or overeating     Feeling bad about yourself - or that you are a failure or have let yourself or your family down    Trouble concentrating on things, such as reading the newspaper or watching television    Moving or speaking so slowly that other people could have noticed.  Or the opposite - being so fidgety or restless that you have been moving around a lot more than usual    Thoughts that you would be better off dead, or hurting yourself in some way     PHQ2-9 Total Score    If you checked off any problems, how difficult have these problems made it for you to do your work, take care of things at home, or get along with other people    Depression Interventions/Treatment      There were no vitals filed for this visit.  Medications Reviewed Today     Reviewed by Karoline Lima, RN (Registered Nurse) on 10/12/23 at 1420  Med List Status: <None>   Medication Order Taking? Sig Documenting Provider Last Dose Status Informant  Accu-Chek Softclix Lancets lancets [id number]  Use to check blood sugar up to twice daily Edman Marsa PARAS, DO  Active Other  acetaminophen  (TYLENOL ) 500 MG tablet [id number]  Take 1,000 mg by mouth 2 (two) times daily as needed for mild pain (pain score 1-3) or moderate pain (pain score 4-6). [provider]  Active Other  amLODipine  (NORVASC ) 10 MG tablet [id number]  Take 1 tablet (10 mg total) by mouth daily. Edman Marsa PARAS, DO  Active   aspirin  81 MG chewable tablet [id number]  Chew 81 mg by mouth daily.   Patient not taking: Reported on 08/02/2023  [provider]  Active Self           Med Note GLORIETTA, ANA K   Wed Jun 07, 2018 11:10 AM)    atorvastatin  (LIPITOR) 40 MG tablet [id number]  Take 1 tablet (40 mg total) by mouth daily. Edman Marsa PARAS, DO  Active   Blood Glucose Monitoring Suppl (ACCU-CHEK GUIDE) w/Device PRESSLEY [id number]  Use as directed to monitor blood sugar Edman Marsa PARAS, DO  Active Other  Blood Pressure Monitoring DEVI [id number]  Use to check BP daily Edman Marsa PARAS, DO  Active   clopidogrel  (PLAVIX ) 75 MG tablet [id number]  Take 1 tablet (75 mg total) by mouth daily. Edman Marsa PARAS, DO  Active   dicyclomine  (BENTYL ) 10 MG capsule [id number]  Take 1 capsule (10 mg total) by mouth 4 (four) times daily -  before meals and at bedtime. As needed for diarrhea, cramping Edman Marsa PARAS, DO  Active   glucose blood (ACCU-CHEK GUIDE) test  strip [id number]  Use to check blood sugar up to twice daily Edman Marsa PARAS, DO  Active Other  Lancets [facility] ULTRASOFT) lancets [id number]  Use to check blood sugar daily, up to 2 times if need Edman Marsa PARAS, DO  Active Other  lisinopril -hydrochlorothiazide  (ZESTORETIC ) 20-12.5 MG tablet [id number]  Take 1 tablet by mouth daily. Edman Marsa PARAS, DO  Active   Multiple Vitamins-Minerals (CERTAVITE SENIOR/ANTIOXIDANT) TABS [id number]  Take 1 tablet by mouth daily.  Patient not taking: Reported on 08/02/2023   [provider]  Active Self  ondansetron  (ZOFRAN -ODT) 4 MG disintegrating tablet [id number]  Take 1 tablet (4 mg total) by mouth every 8 (eight) hours as needed for nausea or vomiting. Edman Marsa PARAS, DO  Active   traZODone  (DESYREL ) 50 MG tablet [id number]  Take 1 tablet (50 mg total) by mouth at bedtime as needed for sleep. Edman Marsa PARAS, DO  Active             Recommendation:   {RECOMMENDATONS:32554}  Follow Up Plan:   {FOLLOWUP:32559}  SIG ***

## 2023-10-14 NOTE — Patient Instructions (Signed)
 Thank you for allowing the Complex Care Management team to participate in your care. It was great speaking with you!  Reminders: -Please attend your PCP appointment as scheduled on November 04, 2023.  We will follow up on November 07, 2023 at 3:30. Please do not hesitate to contact me if you require assistance prior to our next outreach.   Jackson Acron [facility] Population Health RN Care Manager Direct Dial: [phone]  Fax: [phone] Website: [email or web address]

## 2023-11-04 ENCOUNTER — Ambulatory Visit: Admitting: Family Medicine

## 2023-11-04 ENCOUNTER — Encounter: Payer: Self-pay | Admitting: Family Medicine

## 2023-11-04 VITALS — BP 132/68 | HR 64 | Ht 62.0 in | Wt 125.1 lb

## 2023-11-04 DIAGNOSIS — Z23 Encounter for immunization: Secondary | ICD-10-CM

## 2023-11-04 DIAGNOSIS — I6930 Unspecified sequelae of cerebral infarction: Secondary | ICD-10-CM | POA: Diagnosis not present

## 2023-11-04 DIAGNOSIS — I69359 Hemiplegia and hemiparesis following cerebral infarction affecting unspecified side: Secondary | ICD-10-CM | POA: Diagnosis not present

## 2023-11-04 DIAGNOSIS — E1121 Type 2 diabetes mellitus with diabetic nephropathy: Secondary | ICD-10-CM | POA: Diagnosis not present

## 2023-11-04 DIAGNOSIS — E785 Hyperlipidemia, unspecified: Secondary | ICD-10-CM

## 2023-11-04 DIAGNOSIS — I10 Essential (primary) hypertension: Secondary | ICD-10-CM | POA: Diagnosis not present

## 2023-11-04 DIAGNOSIS — E1169 Type 2 diabetes mellitus with other specified complication: Secondary | ICD-10-CM | POA: Diagnosis not present

## 2023-11-04 DIAGNOSIS — F5104 Psychophysiologic insomnia: Secondary | ICD-10-CM

## 2023-11-04 MED ORDER — AMLODIPINE BESYLATE 10 MG PO TABS: 10.0000 mg | ORAL_TABLET | Freq: Every day | ORAL | 3 refills | Status: DC

## 2023-11-04 MED ORDER — CLOPIDOGREL BISULFATE 75 MG PO TABS
75.0000 mg | ORAL_TABLET | Freq: Every day | ORAL | 3 refills | Status: AC
Start: 2023-11-04 — End: ?

## 2023-11-04 MED ORDER — TRAZODONE HCL 50 MG PO TABS
50.0000 mg | ORAL_TABLET | Freq: Every evening | ORAL | 3 refills | Status: AC | PRN
Start: 2023-11-04 — End: ?

## 2023-11-04 MED ORDER — ATORVASTATIN CALCIUM 40 MG PO TABS
40.0000 mg | ORAL_TABLET | Freq: Every day | ORAL | 3 refills | Status: AC
Start: 2023-11-04 — End: ?

## 2023-11-04 MED ORDER — LISINOPRIL-HYDROCHLOROTHIAZIDE 20-12.5 MG PO TABS
1.0000 | ORAL_TABLET | Freq: Every day | ORAL | 3 refills | Status: AC
Start: 2023-11-04 — End: ?

## 2023-11-04 NOTE — Progress Notes (Signed)
 Subjective:    Patient ID: Ellen [name], female    DOB: September 13, 1929, 89 y.o.   MRN: [id number]  Ellen [name] is a 89 y.o. female presenting on 11/04/2023 for Hypertension and Diabetes   HPI  Discussed the use of AI scribe software for clinical note transcription with the patient, who gave verbal consent to proceed.  History of Present Illness   Ellen [name] is a 89 year old female with hypertension who presents for a routine follow-up visit. She is accompanied by her daughter, who is her primary caregiver.  History CVA with residual deficit, Hemiparesis R side dominant Previously on Plavix , question if still taking. It should be with her medications. They did not bring today. They haven't seen plavix  in a while according to daughter. ASA 81mg   Dyslipidemia - Currently taking medication for cholesterol management On Atorvastatin  40mg   Peripheral neuropathy - Numbness in the feet, particularly affecting one foot  Thickened toenails.  Visual impairment - History of macular degeneration - Blurry vision persists despite corrective glasses  Dietary habits and appetite - Appetite remains good Enjoys fast food. Weight stable. Daughter describes her as having the 'appetite of a [age]-year-old'   Type 2 Diabetes Controlled Not on medication Needs lab A1c  CHRONIC HTN: Reports no new concerns. BP has been stable, home readings 130s/80s, no spikes in BP lately Current Meds - Amlodipine   10mg  daily, Lisinopril -hydrochlorothiazide  20-12.5mg  daily Reports good compliance, took meds today. Tolerating well, w/o complaints. - No headaches or other symptoms associated with elevated blood pressure Denies CP, dyspnea, HA, edema, dizziness / lightheadedness   Orthostatic symptoms - improved. No reporting significant concerns. But still has some occasional dizzy if standing up quickly   Insomnia / Sleep disturbances Off Mirtazapine  Improved insomnia sleep now on  Trazodone     Health Maintenance: Flu Shot today     10/12/2023    2:25 PM 08/02/2023    3:28 PM 06/15/2023    2:21 PM  Depression screen PHQ 2/9  Decreased Interest 0 0 0  Down, Depressed, Hopeless 0 0 0  PHQ - 2 Score 0 0 0  Altered sleeping  0 0  Tired, decreased energy  0 0  Change in appetite  0 1  Feeling bad or failure about yourself   0 0  Trouble concentrating  0 0  Moving slowly or fidgety/restless  0 0  Suicidal thoughts  0 0  PHQ-9 Score  0 1  Difficult doing work/chores  Not difficult at all Somewhat difficult       08/02/2023    3:28 PM 06/15/2023    2:21 PM 02/18/2023    3:07 PM 12/15/2022   10:49 AM  GAD 7 : Generalized Anxiety Score  Nervous, Anxious, on Edge 0 0 0 0  Control/stop worrying 0 0 0 0  Worry too much - different things 0 0 0 0  Trouble relaxing 0 0 0 0  Restless 0 0 0 0  Easily annoyed or irritable 0 0 0 0  Afraid - awful might happen 0 0 0 0  Total GAD 7 Score 0 0 0 0  Anxiety Difficulty Not difficult at all Not difficult at all Not difficult at all     Social History   Tobacco Use   Smoking status: Never   Smokeless tobacco: Never  Vaping Use   Vaping status: Never Used  Substance Use Topics   Alcohol use: Never   Drug use: Never    Review of Systems Per HPI  unless specifically indicated above     Objective:    BP 132/68 (BP Location: Left Arm, Patient Position: Sitting, Cuff Size: Normal)   Pulse 64   Ht 5' 2 (1.575 m)   Wt 125 lb 2 oz (56.8 kg)   SpO2 96%   BMI 22.89 kg/m   Wt Readings from Last 3 Encounters:  11/04/23 125 lb 2 oz (56.8 kg)  08/02/23 123 lb (55.8 kg)  06/15/23 128 lb 4 oz (58.2 kg)    Physical Exam Vitals and nursing note reviewed.  Constitutional:      General: She is not in acute distress.    Appearance: She is well-developed. She is not diaphoretic.     Comments: Well-appearing 89 yr female, chronically ill, comfortable, cooperative  HENT:     Head: Normocephalic and atraumatic.      Comments: Mild soft under eyelid skin, non tender no complication Eyes:     General:        Right eye: No discharge.        Left eye: No discharge.     Conjunctiva/sclera: Conjunctivae normal.  Neck:     Thyroid : No thyromegaly.  Cardiovascular:     Rate and Rhythm: Normal rate and regular rhythm.     Heart sounds: Normal heart sounds. No murmur heard. Pulmonary:     Effort: Pulmonary effort is normal. No respiratory distress.     Breath sounds: Normal breath sounds. No wheezing or rales.  Musculoskeletal:        General: Normal range of motion.     Cervical back: Normal range of motion and neck supple.  Lymphadenopathy:     Cervical: No cervical adenopathy.  Skin:    General: Skin is warm and dry.     Findings: No erythema or rash.  Neurological:     Mental Status: She is alert and oriented to person, place, and time.  Psychiatric:        Behavior: Behavior normal.     Comments: Well groomed, good eye contact, normal speech and thoughts     Diabetic Foot Exam - Simple   Simple Foot Form Diabetic Foot exam was performed with the following findings: Yes 11/04/2023  1:35 PM  Visual Inspection See comments: Yes Sensation Testing See comments: Yes Pulse Check Posterior Tibialis and Dorsalis pulse intact bilaterally: Yes Comments Reduced monofilament bilateral feet primarily great toe forefoot and heel. Callus formation great toe and heel bilateral. No ulceration. Deformity of toes with thickened great toenails and angulation of various toes.       Results for orders placed or performed during the hospital encounter of 12/21/22  CBC with Differential   Collection Time: 12/21/22 11:28 AM  Result Value Ref Range   WBC 4.8 4.0 - 10.5 K/uL   RBC 3.73 (L) 3.87 - 5.11 MIL/uL   Hemoglobin 10.5 (L) 12.0 - 15.0 g/dL   HCT 67.3 (L) 63.9 - 53.9 %   MCV 87.4 80.0 - 100.0 fL   MCH 28.2 26.0 - 34.0 pg   MCHC 32.2 30.0 - 36.0 g/dL   RDW 83.4 (H) 88.4 - 84.4 %   Platelets 270 150  - 400 K/uL   nRBC 0.0 0.0 - 0.2 %   Neutrophils Relative % 45 %   Neutro Abs 2.1 1.7 - 7.7 K/uL   Lymphocytes Relative 44 %   Lymphs Abs 2.1 0.7 - 4.0 K/uL   Monocytes Relative 7 %   Monocytes Absolute 0.3 0.1 - 1.0 K/uL  Eosinophils Relative 3 %   Eosinophils Absolute 0.1 0.0 - 0.5 K/uL   Basophils Relative 1 %   Basophils Absolute 0.0 0.0 - 0.1 K/uL   Immature Granulocytes 0 %   Abs Immature Granulocytes 0.00 0.00 - 0.07 K/uL  Basic metabolic panel   Collection Time: 12/21/22 11:28 AM  Result Value Ref Range   Sodium 138 135 - 145 mmol/L   Potassium 4.1 3.5 - 5.1 mmol/L   Chloride 104 98 - 111 mmol/L   CO2 28 22 - 32 mmol/L   Glucose, Bld 97 70 - 99 mg/dL   BUN 22 8 - 23 mg/dL   Creatinine, Ser 8.90 (H) 0.44 - 1.00 mg/dL   Calcium  9.2 8.9 - 10.3 mg/dL   GFR, Estimated 48 (L) >60 mL/min   Anion gap 6 5 - 15      Assessment & Plan:   Problem List Items Addressed This Visit     Essential hypertension - Primary   Relevant Medications   amLODipine  (NORVASC ) 10 MG tablet   atorvastatin  (LIPITOR) 40 MG tablet   lisinopril -hydrochlorothiazide  (ZESTORETIC ) 20-12.5 MG tablet   Other Relevant Orders   CBC with Differential/Platelet   Comprehensive metabolic panel with GFR   Hemiparesis affecting dominant side as late effect of cerebrovascular accident (CVA) (HCC)   Relevant Medications   clopidogrel  (PLAVIX ) 75 MG tablet   History of CVA with residual deficit   Relevant Medications   clopidogrel  (PLAVIX ) 75 MG tablet   Hyperlipidemia associated with type 2 diabetes mellitus (HCC)   Relevant Medications   amLODipine  (NORVASC ) 10 MG tablet   atorvastatin  (LIPITOR) 40 MG tablet   lisinopril -hydrochlorothiazide  (ZESTORETIC ) 20-12.5 MG tablet   Other Relevant Orders   Lipid panel   TSH   Type 2 diabetes mellitus with diabetic nephropathy, without long-term current use of insulin  (HCC)   Relevant Medications   atorvastatin  (LIPITOR) 40 MG tablet    lisinopril -hydrochlorothiazide  (ZESTORETIC ) 20-12.5 MG tablet   Other Relevant Orders   Hemoglobin A1c   Other Visit Diagnoses       Flu vaccine need       Relevant Orders   Flu vaccine HIGH DOSE PF(Fluzone Trivalent) (Completed)     Psychophysiological insomnia       Relevant Medications   traZODone  (DESYREL ) 50 MG tablet        Type 2 diabetes mellitus Complication peripheral neuropathy, HLD, PAD / history CVA Previously controlled Due for labs today A1c No on medication, diet controlled. - Order blood work to assess current blood sugar levels. DM Foot exam She has had eye exam  Hypertension Blood pressure well-controlled at 132/68 mmHg. Home readings consistent with office readings. Followed by VBCI team for monitoring case management and BP - Continue amlodipine  10 mg daily., lisinopril -hydrochlorothiazide  20-12.5mg  daily Renew all rx for future  Hyperlipidemia Cholesterol management well-managed with current medication regimen. Atorvastatin  40mg  daily On ASA 81  Peripheral neuropathy Reports numbness in feet with varying sensation.  History CVA with Hemiparesis Question with med adherence today on med rec, no meds to visit. Not taking clopidogrel . No side effects from previous use. Clopidogrel  beneficial for prevention. - Renew clopidogrel  prescription for stroke prevention.  Onychomycosis Thickened toenails, possibly fungal or damaged nail bed. No pain or infection. - Monitor for changes or signs of infection.  Insomnia Improved control Using trazodone  at bedtime with good effect. No changes in sleep pattern or efficacy. - Renew trazodone  prescription for 90 days.  Under eye skin swelling - Recommend cool  compresses or cosmetic products.      route to [place], confirm med rec / Plavix   Orders Placed This Encounter  Procedures   Flu vaccine HIGH DOSE PF(Fluzone Trivalent)   Lipid panel    Has the patient fasted?:   Yes   Hemoglobin A1c   CBC with  Differential/Platelet   TSH   Comprehensive metabolic panel with GFR    Has the patient fasted?:   Yes    Meds ordered this encounter  Medications   amLODipine  (NORVASC ) 10 MG tablet    Sig: Take 1 tablet (10 mg total) by mouth daily.    Dispense:  90 tablet    Refill:  3    Future refills added   atorvastatin  (LIPITOR) 40 MG tablet    Sig: Take 1 tablet (40 mg total) by mouth daily.    Dispense:  90 tablet    Refill:  3    Please keep refills on file until due in January 2026   clopidogrel  (PLAVIX ) 75 MG tablet    Sig: Take 1 tablet (75 mg total) by mouth daily.    Dispense:  90 tablet    Refill:  3   lisinopril -hydrochlorothiazide  (ZESTORETIC ) 20-12.5 MG tablet    Sig: Take 1 tablet by mouth daily.    Dispense:  90 tablet    Refill:  3    Please keep refills on file until due in January 2026   traZODone  (DESYREL ) 50 MG tablet    Sig: Take 1 tablet (50 mg total) by mouth at bedtime as needed for sleep.    Dispense:  90 tablet    Refill:  3    Change to 90 day instead of 30, ready for refill    Follow up plan: Return in about 6 months (around 05/04/2024) for 6 month DM A1c, HTN.   Marsa Officer, DO [facility] 11/04/2023, 1:21 PM

## 2023-11-04 NOTE — Patient Instructions (Addendum)
 Thank you for coming to the office today.  Labs today  Medications refilled  Add back Clopidogrel  Plavix  75mg  daily  Recommend cool compresses or cosmetic cooling skin gel pads for the face   Please schedule a Follow-up Appointment to: Return in about 6 months (around 05/04/2024) for 6 month DM A1c, HTN.  If you have any other questions or concerns, please feel free to call the office or send a message through MyChart. You may also schedule an earlier appointment if necessary.  Additionally, you may be receiving a survey about your experience at our office within a few days to 1 week by e-mail or mail. We value your feedback.  Marsa Officer, DO [facility], [place]

## 2023-11-05 LAB — CBC WITH DIFFERENTIAL/PLATELET
Absolute Lymphocytes: 1091 {cells}/uL (ref 850–3900)
Absolute Monocytes: 229 {cells}/uL (ref 200–950)
Basophils Absolute: 22 {cells}/uL (ref 0–200)
Basophils Relative: 0.5 %
Eosinophils Absolute: 0 {cells}/uL — ABNORMAL LOW (ref 15–500)
Eosinophils Relative: 0 %
HCT: 34.2 % — ABNORMAL LOW (ref 35.0–45.0)
Hemoglobin: 10.6 g/dL — ABNORMAL LOW (ref 11.7–15.5)
MCH: 27.7 pg (ref 27.0–33.0)
MCHC: 31 g/dL — ABNORMAL LOW (ref 32.0–36.0)
MCV: 89.5 fL (ref 80.0–100.0)
MPV: 10.6 fL (ref 7.5–12.5)
Monocytes Relative: 5.2 %
Neutro Abs: 3058 {cells}/uL (ref 1500–7800)
Neutrophils Relative %: 69.5 %
Platelets: 339 Thousand/uL (ref 140–400)
RBC: 3.82 Million/uL (ref 3.80–5.10)
RDW: 14.1 % (ref 11.0–15.0)
Total Lymphocyte: 24.8 %
WBC: 4.4 Thousand/uL (ref 3.8–10.8)

## 2023-11-05 LAB — COMPREHENSIVE METABOLIC PANEL WITH GFR
AG Ratio: 1.2 (calc) (ref 1.0–2.5)
ALT: 7 U/L (ref 6–29)
AST: 19 U/L (ref 10–35)
Albumin: 4.1 g/dL (ref 3.6–5.1)
Alkaline phosphatase (APISO): 48 U/L (ref 37–153)
BUN/Creatinine Ratio: 18 (calc) (ref 6–22)
BUN: 20 mg/dL (ref 7–25)
CO2: 31 mmol/L (ref 20–32)
Calcium: 9.4 mg/dL (ref 8.6–10.4)
Chloride: 97 mmol/L — ABNORMAL LOW (ref 98–110)
Creat: 1.14 mg/dL — ABNORMAL HIGH (ref 0.60–0.95)
Globulin: 3.5 g/dL (ref 1.9–3.7)
Glucose, Bld: 118 mg/dL — ABNORMAL HIGH (ref 65–99)
Potassium: 4.7 mmol/L (ref 3.5–5.3)
Sodium: 135 mmol/L (ref 135–146)
Total Bilirubin: 0.5 mg/dL (ref 0.2–1.2)
Total Protein: 7.6 g/dL (ref 6.1–8.1)
eGFR: 45 mL/min/1.73m2 — ABNORMAL LOW (ref >=60)

## 2023-11-05 LAB — HEMOGLOBIN A1C
Hgb A1c MFr Bld: 6 % — ABNORMAL HIGH (ref <5.7)
Mean Plasma Glucose: 126 mg/dL
eAG (mmol/L): 7 mmol/L

## 2023-11-05 LAB — TSH: TSH: 2.26 m[IU]/L (ref 0.40–4.50)

## 2023-11-05 LAB — LIPID PANEL
Cholesterol: 166 mg/dL (ref <200)
HDL: 68 mg/dL (ref >=50)
LDL Cholesterol (Calc): 83 mg/dL
Non-HDL Cholesterol (Calc): 98 mg/dL (ref <130)
Total CHOL/HDL Ratio: 2.4 (calc) (ref <5.0)
Triglycerides: 69 mg/dL (ref <150)

## 2023-11-07 ENCOUNTER — Ambulatory Visit: Payer: Self-pay | Admitting: Family Medicine

## 2023-11-07 ENCOUNTER — Telehealth: Payer: Self-pay

## 2023-12-04 ENCOUNTER — Other Ambulatory Visit: Payer: Self-pay | Admitting: Family Medicine

## 2023-12-04 DIAGNOSIS — I6930 Unspecified sequelae of cerebral infarction: Secondary | ICD-10-CM

## 2023-12-04 DIAGNOSIS — I69359 Hemiplegia and hemiparesis following cerebral infarction affecting unspecified side: Secondary | ICD-10-CM

## 2023-12-05 ENCOUNTER — Other Ambulatory Visit: Payer: Self-pay | Admitting: Family Medicine

## 2023-12-05 DIAGNOSIS — E1169 Type 2 diabetes mellitus with other specified complication: Secondary | ICD-10-CM

## 2023-12-06 NOTE — Telephone Encounter (Signed)
 Rx 11/04/23 #90 3RF- 1 yr supply Requested Prescriptions  Pending Prescriptions Disp Refills   atorvastatin  (LIPITOR) 40 MG tablet [Pharmacy Med Name: ATORVASTATIN  40MG  TABLETS] 90 tablet 3    Sig: TAKE 1 TABLET(40 MG) BY MOUTH DAILY     Cardiovascular:  Antilipid - Statins Failed - 12/06/2023  2:53 PM      Failed - Lipid Panel in normal range within the last 12 months    Cholesterol  Date Value Ref Range Status  11/04/2023 166 <200 mg/dL Final   LDL Cholesterol (Calc)  Date Value Ref Range Status  11/04/2023 83 mg/dL (calc) Final    Comment:    Reference range: <100 . Desirable range <100 mg/dL for primary prevention;   <70 mg/dL for patients with CHD or diabetic patients  with > or = 2 CHD risk factors. SABRA LDL-C is now calculated using the Martin-Hopkins  calculation, which is a validated novel method providing  better accuracy than the Friedewald equation in the  estimation of LDL-C.  Gladis APPLETHWAITE et al. SANDREA. 7986;689(80): 2061-2068  (http://education.[employer].com/faq/FAQ164)    HDL  Date Value Ref Range Status  11/04/2023 68 > OR = 50 mg/dL Final   Triglycerides  Date Value Ref Range Status  11/04/2023 69 <150 mg/dL Final         Passed - Patient is not pregnant      Passed - Valid encounter within last 12 months    Recent Outpatient Visits           1 month ago Essential hypertension   [place] [facility] [place], Marsa PARAS, DO   4 months ago Essential hypertension   [place] [facility] Marsa PARAS, DO   5 months ago Viral gastroenteritis   [place] [facility] [place] Marsa PARAS, DO   8 months ago Chronic right shoulder pain   [place] [facility] [place], Marsa PARAS, [place]

## 2023-12-06 NOTE — Telephone Encounter (Signed)
 Too soon for refill.  Requested Prescriptions  Pending Prescriptions Disp Refills   clopidogrel  (PLAVIX ) 75 MG tablet [Pharmacy Med Name: CLOPIDOGREL  75MG  TABLETS] 90 tablet 3    Sig: TAKE 1 TABLET(75 MG) BY MOUTH DAILY     Hematology: Antiplatelets - clopidogrel  Failed - 12/06/2023 12:39 PM      Failed - HCT in normal range and within 180 days    HCT  Date Value Ref Range Status  11/04/2023 34.2 (L) 35.0 - 45.0 % Final         Failed - HGB in normal range and within 180 days    Hemoglobin  Date Value Ref Range Status  11/04/2023 10.6 (L) 11.7 - 15.5 g/dL Final         Failed - Cr in normal range and within 360 days    Creat  Date Value Ref Range Status  11/04/2023 1.14 (H) 0.60 - 0.95 mg/dL Final   Creatinine, Urine  Date Value Ref Range Status  07/23/2021 118 20 - 275 mg/dL Final         Passed - PLT in normal range and within 180 days    Platelets  Date Value Ref Range Status  11/04/2023 339 140 - 400 Thousand/uL Final         Passed - Valid encounter within last 6 months    Recent Outpatient Visits           1 month ago Essential hypertension   [place] [facility] [place] Marsa PARAS, DO   4 months ago Essential hypertension   [place] [facility] [place] Marsa PARAS, DO   5 months ago Viral gastroenteritis   [place] [facility] Marsa PARAS, DO   8 months ago Chronic right shoulder pain   [place] [facility] [place], Marsa PARAS, [place]

## 2023-12-09 ENCOUNTER — Other Ambulatory Visit: Payer: Self-pay | Admitting: Pharmacist

## 2023-12-09 DIAGNOSIS — I10 Essential (primary) hypertension: Secondary | ICD-10-CM

## 2023-12-09 NOTE — Progress Notes (Signed)
   12/09/2023  Patient ID: Ellen [name], female   DOB: 01/27/1930, 89 y.o.   MRN: [id number]  This patient is appearing on a report for being at risk of failing the adherence measure for hypertension (ACEi/ARB) medications this calendar year.   Medication: lisinopril -HCTZ 20-12.5 mg daily Last fill date: 08/02/2023 for 90 day supply  Speak with patient's daughter/caregiver, Ellen [name], by telephone today   Daughter reports that patient recently ran out of her lisinopril -HCTZ prescription and that she contacted [facility] for a refill  Daughter shares that her husband passed away this week. Will follow up with patient/caregiver again in a few weeks to follow up regarding Hypertension/Medication Adherence  Outreach to [facility] in [place], [place] on behalf of patient today. Find that lisinopril -HCTZ prescription is not currently filled. Request that pharmacy refill for patient today.  Follow Up Plan: Clinical Pharmacist will follow up with patient and caregiver by telephone on 12/23/2023 at 10:30 AM    Sharyle Sia, PharmD, Ellen [name], CPP Clinical Pharmacist [facility] [phone]

## 2023-12-14 NOTE — Progress Notes (Signed)
 Ellen [name]                                          MRN: [id number]   12/14/2023   The VBCI Quality Team Specialist reviewed this patient medical record for the purposes of chart review for care gap closure. The following were reviewed: abstraction for care gap closure-glycemic status assessment.    VBCI Quality Team

## 2023-12-15 ENCOUNTER — Telehealth: Payer: Self-pay

## 2023-12-15 NOTE — Patient Instructions (Signed)
 Ellen [name] - I am sorry I was unable to reach you today for our scheduled appointment. I work with Edman, Marsa PARAS, DO and am calling to support your healthcare needs. Please contact me at your earliest convenience. I look forward to speaking with you soon.   Thank you,   [facility] RN Care Manager Direct Dial: [phone]  Fax: [phone] Website: [email or web address]

## 2023-12-15 NOTE — Patient Outreach (Signed)
 Encounter opened in error. Please disregard.

## 2023-12-23 ENCOUNTER — Other Ambulatory Visit

## 2023-12-23 ENCOUNTER — Telehealth: Payer: Self-pay | Admitting: Pharmacist

## 2023-12-23 NOTE — Progress Notes (Signed)
   Outreach Note  12/23/2023 Name: Ellen [name] MRN: [id number] DOB: 03-Apr-1929  Referred by: Edman Marsa PARAS, DO  Was unable to reach patient via telephone today and have left HIPAA compliant voicemail asking patient to return my call.    Follow Up Plan: Will collaborate with Care Guide to outreach to schedule follow up with me  Sharyle Sia, PharmD, JAQUELINE, CPP Clinical Pharmacist [facility] [phone]

## 2023-12-28 ENCOUNTER — Telehealth: Payer: Self-pay

## 2023-12-28 NOTE — Patient Outreach (Signed)
 Encounter created to complete case closure

## 2023-12-28 NOTE — Patient Instructions (Signed)
 Ellen [name] - I have attempted to call you three times but have been unsuccessful in reaching you. I work with Edman, Marsa PARAS, DO and am calling to support your healthcare needs. If I can be of assistance to you, please contact me.  Thank you,   [facility] Acron [facility] Health RN Care Manager Direct Dial: [phone]  Fax: [phone] Website: [email or web address]

## 2024-01-01 ENCOUNTER — Other Ambulatory Visit: Payer: Self-pay | Admitting: Family Medicine

## 2024-01-01 DIAGNOSIS — I10 Essential (primary) hypertension: Secondary | ICD-10-CM

## 2024-01-03 ENCOUNTER — Other Ambulatory Visit: Payer: Self-pay

## 2024-01-03 DIAGNOSIS — I10 Essential (primary) hypertension: Secondary | ICD-10-CM

## 2024-01-03 MED ORDER — AMLODIPINE BESYLATE 10 MG PO TABS: 10.0000 mg | ORAL_TABLET | Freq: Every day | ORAL | 3 refills | Status: AC

## 2024-01-04 NOTE — Telephone Encounter (Signed)
 Duplicate request, Rx 01/03/24 #90 3RF Requested Prescriptions  Pending Prescriptions Disp Refills   amLODipine  (NORVASC ) 10 MG tablet [Pharmacy Med Name: AMLODIPINE  BESYLATE 10MG TABLETS] 90 tablet 3    Sig: TAKE 1 TABLET(10 MG) BY MOUTH DAILY     Cardiovascular: Calcium  Channel Blockers 2 Passed - 01/04/2024  1:54 PM      Passed - Last BP in normal range    BP Readings from Last 1 Encounters:  11/04/23 132/68         Passed - Last Heart Rate in normal range    Pulse Readings from Last 1 Encounters:  11/04/23 64         Passed - Valid encounter within last 6 months    Recent Outpatient Visits           2 months ago Essential hypertension   [place] [facility] [place], Marsa PARAS, DO   5 months ago Essential hypertension   [place] [facility] Marsa PARAS, DO   6 months ago Viral gastroenteritis   [place] [facility] [facility] Marsa PARAS, DO   9 months ago Chronic right shoulder pain   [place] [facility] [place], Marsa PARAS, [place]

## 2024-02-17 ENCOUNTER — Ambulatory Visit: Payer: Self-pay

## 2024-02-17 ENCOUNTER — Other Ambulatory Visit: Admitting: Pharmacist

## 2024-02-17 DIAGNOSIS — I10 Essential (primary) hypertension: Secondary | ICD-10-CM

## 2024-02-17 NOTE — Progress Notes (Signed)
 "  02/17/2024 Name: Ellen [name] MRN: [id number] DOB: [birth year]  Chief Complaint  Patient presents with   Medication Management   Medication Adherence    Ellen [name] is a 89 y.o. year old female who presented for a telephone visit.   They were referred to the pharmacist by their PCP for assistance in managing hypertension and medication adherence.    Speak with patient and daughter, Ellen [name], by telephone today   Subjective:   Care Team: Primary Care Provider: Edman Marsa PARAS, DO ; Next Scheduled Visit: 05/04/2024  Medication Access/Adherence  Current Pharmacy:  [id number] - [place], [place] - [address] [address] [place] [address] [place]  [address] Phone: [phone] Fax: [phone]   Patient reports affordability concerns with their medications: No  Patient reports access/transportation concerns to their pharmacy: No  Patient reports adherence concerns with their medications:  No   Reports using weekly pillbox and daughter supports    Note patient is currently staying with daughter   Hypertension:   Current medications:  - lisinopril -HCTZ 20-12.5 mg - 1 tablet daily - amlodipine  10 mg daily   Medications previously tried: clonidine     Admits to sometimes adding some salt to her food    Reports patient needing to purchase a new upper arm blood pressure monitor   Denies symptoms of hypotension such as dizziness or lightheadedness   Current physical activity: stays active throughout the day   Objective:  Lab Results  Component Value Date   CREATININE 1.14 (H) 11/04/2023   BUN 20 11/04/2023   NA 135 11/04/2023   K 4.7 11/04/2023   CL 97 (L) 11/04/2023   CO2 31 11/04/2023    Lab Results  Component Value Date   CHOL 166 11/04/2023   HDL 68 11/04/2023   LDLCALC 83 11/04/2023   TRIG 69 11/04/2023   CHOLHDL 2.4 11/04/2023   BP Readings from Last 3 Encounters:  11/04/23 132/68  08/02/23 (!)  138/58  06/15/23 (!) 140/78   Pulse Readings from Last 3 Encounters:  11/04/23 64  08/02/23 65  06/15/23 61     Medications Reviewed Today     Reviewed by Alana Sharyle LABOR, RPH-CPP (Pharmacist) on 02/17/24 at 1219  Med List Status: <None>   Medication Order Taking? Sig Documenting Provider Last Dose Status Informant  Accu-Chek Softclix Lancets lancets [id number]  Use to check blood sugar up to twice daily Edman Marsa PARAS, DO  Active Other  acetaminophen  (TYLENOL ) 500 MG tablet [id number]  Take 1,000 mg by mouth 2 (two) times daily as needed for mild pain (pain score 1-3) or moderate pain (pain score 4-6). [provider]  Active Other  amLODipine  (NORVASC ) 10 MG tablet [id number] Yes Take 1 tablet (10 mg total) by mouth daily. Edman Marsa PARAS, DO  Active   aspirin  81 MG chewable tablet [id number]  Chew 81 mg by mouth daily.  [provider]  Active Self           Med Note GLORIETTA, ANA K   Wed Jun 07, 2018 11:10 AM)    atorvastatin  (LIPITOR) 40 MG tablet [id number] Yes Take 1 tablet (40 mg total) by mouth daily. Edman Marsa PARAS, DO  Active   Blood Glucose Monitoring Suppl (ACCU-CHEK GUIDE) w/Device PRESSLEY [id number]  Use as directed to monitor blood sugar Edman Marsa PARAS, DO  Active Other  Blood Pressure Monitoring DEVI [id number]  Use to check BP daily Edman Marsa PARAS, DO  Active  clopidogrel  (PLAVIX ) 75 MG tablet [id number] Yes Take 1 tablet (75 mg total) by mouth daily. Edman Marsa PARAS, DO  Active   dicyclomine  (BENTYL ) 10 MG capsule [id number]  Take 1 capsule (10 mg total) by mouth 4 (four) times daily -  before meals and at bedtime. As needed for diarrhea, cramping Edman Marsa PARAS, DO  Active   glucose blood (ACCU-CHEK GUIDE) test strip [id number]  Use to check blood sugar up to twice daily Edman Marsa PARAS, DO  Active Other  Lancets [facility] ULTRASOFT) lancets [id number]  Use to check blood sugar daily, up  to 2 times if need Edman Marsa PARAS, DO  Active Other  lisinopril -hydrochlorothiazide  (ZESTORETIC ) 20-12.5 MG tablet [id number] Yes Take 1 tablet by mouth daily. Edman Marsa PARAS, DO  Active   Multiple Vitamins-Minerals (CERTAVITE SENIOR/ANTIOXIDANT) TABS [id number]  Take 1 tablet by mouth daily.  Patient not taking: Reported on 11/04/2023   [provider]  Active Self  ondansetron  (ZOFRAN -ODT) 4 MG disintegrating tablet [id number]  Take 1 tablet (4 mg total) by mouth every 8 (eight) hours as needed for nausea or vomiting. Edman Marsa PARAS, DO  Active   traZODone  (DESYREL ) 50 MG tablet [id number]  Take 1 tablet (50 mg total) by mouth at bedtime as needed for sleep. Edman Marsa PARAS, DO  Active               Assessment/Plan:   Encourage patient/caregiver to continue using weekly pillbox. Encourage caregiver to manage medications for patient and review weekly pillbox to monitor for missed doses. Encourage to use medication list from latest office visit with PCP as adherence aid   Hypertension: - Reviewed long term cardiovascular and renal outcomes of uncontrolled blood pressure - Encourage patient to limit salt/sodium intake - Have counseled on blood pressure monitoring technique for rested readings, including checking at least 30 minutes after caffeine and at least 1-2 hours after morning medications - Counsel on option to obtain new upper arm blood pressure monitor using health plan OTC card - Recommend to obtain new upper arm blood pressure monitor and restart checking home blood pressure, keep log of results and have this record to review at upcoming medical appointment with PCP. Patient to contact provider office sooner if needed for readings outside of established parameters or symptoms     Follow Up Plan: Clinical Pharmacist will follow up with patient and caregiver by telephone on 03/23/2024 at 12:00 PM    Sharyle Sia, PharmD, JAQUELINE,  CPP Clinical Pharmacist [facility] [phone]   "

## 2024-02-17 NOTE — Telephone Encounter (Signed)
 Reviewed. According to triage patient is out of state at this time, so we will follow when they return to [facility] and will need to review records.  Agree with triage.  Marsa Officer, DO [facility] 02/17/2024, 1:25 PM

## 2024-02-17 NOTE — Telephone Encounter (Signed)
 Pt daughter calling in reporting that she received a call at 12:57 pm from [phone] (NT) but no note in chart about attempted call. Advised pt daughter that she will receive another call if there were any additional questions or instructions, but to otherwise go ahead and call 911 as advised. Pt daughter verbalized understanding and intent to do so.

## 2024-02-17 NOTE — Patient Instructions (Signed)
 Goals Addressed             This Visit's Progress    Pharmacy Goals       Check your blood pressure once daily, and any time you have concerning symptoms like headache, chest pain, dizziness, shortness of breath, or vision changes.   Our goal is less than 130/80.  To appropriately check your blood pressure, make sure you do the following:  1) Avoid caffeine, exercise, or tobacco products for 30 minutes before checking. Empty your bladder. 2) Sit with your back supported in a flat-backed chair. Rest your arm on something flat (arm of the chair, table, etc). 3) Sit still with your feet flat on the floor, resting, for at least 5 minutes.  4) Check your blood pressure. Take 1-2 readings.  5) Write down these readings and bring with you to any provider appointments.  Bring your home blood pressure machine with you to a provider's office for accuracy comparison at least once a year.   Make sure you take your blood pressure medications before you come to any office visit, even if you were asked to fast for labs.   Feel free to call me with any questions or concerns. I look forward to our next call!   Estelle Grumbles, PharmD, Patsy Baltimore, CPP Clinical Pharmacist [facility] [phone]

## 2024-02-17 NOTE — Telephone Encounter (Signed)
 FYI Only or Action Required?: FYI only for provider: ED advised.  Patient was last seen in primary care on 11/04/2023 by Edman Marsa PARAS, DO.  Called Nurse Triage reporting Numbness.  Symptoms began several weeks ago.  Interventions attempted: Nothing.  Symptoms are: unchanged.  Triage Disposition: Advised ED/911; out of state.See HCP Within 4 Hours (Or PCP Triage)  Patient/caregiver understands and will follow disposition?:  Reason for Disposition  [1] Numbness (i.e., loss of sensation) of the face, arm / hand, or leg / foot on one side of the body AND [2] gradual onset (e.g., days to weeks) AND [3] present now    Nurse judgement: Advised ED/911 now. [name] reports calling 911, currently out of state.  Answer Assessment - Initial Assessment Questions Advised ED now and 911 if symptoms worsen:Severe HA, changes vision, confusion, diff speech/ walking, numbness/weakness one side of body.   Patient is currently out of state with daughter. Patient's daughter reports will call 911 now.   1. SYMPTOM: What is the main symptom you are concerned about? (e.g., weakness, numbness)     Right side weakness, starting at shoulder to right foot 2. ONSET: When did this start? (e.g., minutes, hours, days; while sleeping)     6 months ago, got worse weeks ago. pt and pt's daughter reports patient able to stand, walk, eat drink, denies changes vision, HA, trouble walking, speech. 3. LAST NORMAL: When was the last time you (the patient) were normal (no symptoms)?     Months ago 4. PATTERN Does this come and go, or has it been constant since it started?  Is it present now?     constant  6. NEUROLOGIC SYMPTOMS: Have you had any of the following symptoms: headache, dizziness, vision loss, double vision, changes in speech, unsteady on your feet?     Numbness on right side 7. OTHER SYMPTOMS: Do you have any other symptoms? No diff breathing, chest pain,  confusion  Protocols used: Neurologic Deficit-A-AH

## 2024-02-21 ENCOUNTER — Ambulatory Visit: Payer: Self-pay | Admitting: *Deleted

## 2024-02-21 NOTE — Telephone Encounter (Signed)
 FYI Only or Action Required?: FYI only for provider: ED advised.  Patient was last seen in primary care on 11/04/2023 by Edman Marsa PARAS, DO.  Called Nurse Triage reporting Abdominal Pain (Dark stool).  Symptoms began several days ago.  Interventions attempted: OTC medications: Pepto.  Symptoms are: pain and diarrhea are better- stoll color has remained the same.  Triage Disposition: Go to ED Now (Notify PCP)  Patient/caregiver understands and will follow disposition?: Unsure  Message from Orrick S sent at 02/21/2024 10:28 AM EST  Reason for Triage: abdominal pain level 5.5. Diarrhea and black stool. Patient believes black stool is sign of cancer   Reason for Disposition  Black or tarry bowel movements  (Exception: Chronic-unchanged black-grey BMs AND is taking iron pills or Pepto-Bismol.)  Answer Assessment - Initial Assessment Questions Patient is calling to report she has balck stool and diarrhea- she did take Pepto which did help her symptoms- but the black stool was present before she took the pepto. Patient has been advised ED- but she states she is in the [place] and is snowed in at tis time. She will wait until roads are cleared and go to local ED there- will follow up with PCP when she comes home.    1. LOCATION: Where does it hurt?      LLQ pain 2. RADIATION: Does the pain shoot anywhere else? (e.g., chest, back)     no 3. ONSET: When did the pain begin? (e.g., minutes, hours or days ago)      Saturday 4. SUDDEN: Gradual or sudden onset?     sudden 5. PATTERN Does the pain come and go, or is it constant?     Patient states she got better after taking Pepto and the diarrhea subsided  6. SEVERITY: How bad is the pain?  (e.g., Scale 1-10; mild, moderate, or severe)     5/10 7. RECURRENT SYMPTOM: Have you ever had this type of stomach pain before? If Yes, ask: When was the last time? and What happened that time?      no 8. CAUSE: What do you  think is causing the stomach pain? (e.g., gallstones, recent abdominal surgery)     Unsure- patient is scared she could have cancer 9. RELIEVING/AGGRAVATING FACTORS: What makes it better or worse? (e.g., antacids, bending or twisting motion, bowel movement)     Pepto did help the diarrhea and abdominal cramping- but patient states she was having dark/black stool before she took Pepto   10. OTHER SYMPTOMS: Do you have any other symptoms? (e.g., back pain, diarrhea, fever, urination pain, vomiting)       diarrhea  Protocols used: Abdominal Pain - Female-A-AH

## 2024-03-23 ENCOUNTER — Other Ambulatory Visit

## 2024-05-04 ENCOUNTER — Ambulatory Visit: Admitting: Family Medicine
